# Patient Record
Sex: Female | Born: 1990 | Race: White | Hispanic: No | Marital: Single | Smoking: Former smoker
Health system: Southern US, Community
[De-identification: ages and names within clinical notes are randomized; demographics above are authoritative.]

## PROBLEM LIST (undated history)

## (undated) ENCOUNTER — Ambulatory Visit (HOSPITAL_COMMUNITY): Admission: EM | Payer: BC Managed Care – PPO | Source: Home / Self Care

## (undated) DIAGNOSIS — Z20828 Contact with and (suspected) exposure to other viral communicable diseases: Secondary | ICD-10-CM

## (undated) DIAGNOSIS — F4310 Post-traumatic stress disorder, unspecified: Secondary | ICD-10-CM

## (undated) DIAGNOSIS — G930 Cerebral cysts: Secondary | ICD-10-CM

## (undated) DIAGNOSIS — F419 Anxiety disorder, unspecified: Secondary | ICD-10-CM

## (undated) DIAGNOSIS — H6690 Otitis media, unspecified, unspecified ear: Secondary | ICD-10-CM

## (undated) DIAGNOSIS — F1910 Other psychoactive substance abuse, uncomplicated: Secondary | ICD-10-CM

## (undated) DIAGNOSIS — F209 Schizophrenia, unspecified: Secondary | ICD-10-CM

## (undated) DIAGNOSIS — B083 Erythema infectiosum [fifth disease]: Secondary | ICD-10-CM

## (undated) DIAGNOSIS — B2790 Infectious mononucleosis, unspecified without complication: Secondary | ICD-10-CM

## (undated) DIAGNOSIS — G4700 Insomnia, unspecified: Secondary | ICD-10-CM

## (undated) DIAGNOSIS — F99 Mental disorder, not otherwise specified: Secondary | ICD-10-CM

## (undated) DIAGNOSIS — F319 Bipolar disorder, unspecified: Secondary | ICD-10-CM

## (undated) HISTORY — DX: Contact with and (suspected) exposure to other viral communicable diseases: Z20.828

## (undated) HISTORY — PX: WISDOM TOOTH EXTRACTION: SHX21

---
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
## 2010-06-06 ENCOUNTER — Ambulatory Visit (HOSPITAL_COMMUNITY): Admission: RE | Admit: 2010-06-06 | Discharge: 2010-06-06 | Payer: Self-pay | Admitting: Psychiatry

## 2010-12-12 ENCOUNTER — Emergency Department (HOSPITAL_COMMUNITY)
Admission: EM | Admit: 2010-12-12 | Discharge: 2010-12-12 | Disposition: A | Discharge status: Home / Self Care | Payer: 59 | Source: Home / Self Care | Attending: Emergency Medicine | Admitting: Emergency Medicine

## 2010-12-12 ENCOUNTER — Emergency Department (HOSPITAL_COMMUNITY): Payer: 59

## 2010-12-12 ENCOUNTER — Inpatient Hospital Stay (HOSPITAL_COMMUNITY)
Admission: EM | Admit: 2010-12-12 | Discharge: 2010-12-15 | DRG: 057 | Disposition: A | Discharge status: Home / Self Care | Payer: 59 | Attending: Neurology | Admitting: Neurology

## 2010-12-12 DIAGNOSIS — R51 Headache: Secondary | ICD-10-CM | POA: Insufficient documentation

## 2010-12-12 DIAGNOSIS — L708 Other acne: Secondary | ICD-10-CM | POA: Diagnosis present

## 2010-12-12 DIAGNOSIS — F4310 Post-traumatic stress disorder, unspecified: Secondary | ICD-10-CM | POA: Diagnosis present

## 2010-12-12 DIAGNOSIS — M436 Torticollis: Secondary | ICD-10-CM | POA: Insufficient documentation

## 2010-12-12 DIAGNOSIS — G912 (Idiopathic) normal pressure hydrocephalus: Principal | ICD-10-CM | POA: Diagnosis present

## 2010-12-12 DIAGNOSIS — Z79899 Other long term (current) drug therapy: Secondary | ICD-10-CM

## 2010-12-12 DIAGNOSIS — R112 Nausea with vomiting, unspecified: Secondary | ICD-10-CM | POA: Insufficient documentation

## 2010-12-12 LAB — POCT PREGNANCY, URINE: Preg Test, Ur: NEGATIVE

## 2010-12-12 MED ORDER — GADOBENATE DIMEGLUMINE 529 MG/ML IV SOLN
12.0000 mL | Freq: Once | INTRAVENOUS | Status: AC
  Administered 2010-12-12: 12 mL via INTRAVENOUS

## 2010-12-13 ENCOUNTER — Emergency Department (HOSPITAL_COMMUNITY): Payer: 59

## 2010-12-13 LAB — PROTEIN AND GLUCOSE, CSF
Glucose, CSF: 69 mg/dL (ref 43–76)
Total  Protein, CSF: 22 mg/dL (ref 15–45)

## 2010-12-13 LAB — CSF CELL COUNT WITH DIFFERENTIAL
RBC Count, CSF: 1 /mm3 — ABNORMAL HIGH
Tube #: 1
WBC, CSF: 2 /mm3 (ref 0–5)

## 2010-12-14 LAB — BASIC METABOLIC PANEL
BUN: 7 mg/dL (ref 6–23)
CO2: 24 mEq/L (ref 19–32)
Calcium: 9.5 mg/dL (ref 8.4–10.5)
Creatinine, Ser: 0.77 mg/dL (ref 0.4–1.2)
Glucose, Bld: 99 mg/dL (ref 70–99)

## 2010-12-14 LAB — VDRL, CSF: VDRL Quant, CSF: NONREACTIVE

## 2010-12-14 NOTE — H&P (Signed)
NAME:  Stacy Poole, Stacy Poole             ACCOUNT NO.:  [id number]  MEDICAL RECORD NO.:  [id number]           PATIENT TYPE:  I  LOCATION:  3033                         FACILITY:  [facility]  PHYSICIAN:  Levie Heritage, MD       DATE OF BIRTH:  March 12, 1991  DATE OF ADMISSION:  12/13/2010 DATE OF DISCHARGE:                             HISTORY & PHYSICAL   REASON FOR ADMISSION:  Headaches.  CHIEF COMPLAINT:  Headaches for more than 1 week now.  HISTORY OF PRESENT ILLNESS:  This patient is a 20 year old woman with almost 1-week history of severe headache.  She states that it started on the previous Tuesday, December 07, 2010, with severe throbbing pattern that originally started in the frontal areas of the forehead and then there was associated vomiting and nausea with that.  She has vomited twice at least since then.  In her own words "throbbing frontal and back with pressure on eyes with lot of botheration with light and some, discomfort with noise as well."  The patient also states that it clearly worsens when she has her head position low level and also when she sneezes or coughs or just puts too much pressure on her brain worsens her headache. Additionally, the patient stated that for the last 24 hours she has developed a visual problem where she sees double of everything in a horizontal plane sometime and occasionally it improves as well.  PREVIOUS HEADACHE HISTORY:  The patient is known to have headaches more like migraine without auras for almost 5 years now.  Her mother and sister also has similar patterns of headaches, however, on average, the patient gets one headache at the most per month.  Does not use any prophylaxis medication for that, and it always goes away with the sleep. Clearly, the patient has above headache totally different from her migraine patterns.  She never had any neurological deficits with her typical migraine headaches in the past and has not been using  over-the- counter analgesic medications for long time either.  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  This was September 2011.  Since then, the patient has been on antipsychotic medications. Her psychiatrist is Dr. Geoffery Lyons, and she has been on Zyprexa and Abilify in the past, but currently she is being taking 40 mg daily basis of Latuda.  Other than that, she had a past medical history significant for acne for what she has been taking minocycline.  The patient gets counseling twice a week and sees her psychiatrist on monthly basis.  FAMILY HISTORY:  Mother and sister both have migraine headaches.  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  CURRENT MEDICATIONS:  Minocycline, which she took last dose few days ago for a  month or more and then she is also taking Latuda 40 mg per day.  ALLERGIES:  She denies any allergies to any medications she knows of.  REVIEW OF CLINICAL DATA:  I have reviewed the patient's MRIs as well as CAT scan of the brain that was performed today and yesterday.  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  I have also noted her urine pregnancy test is negative.  CURRENT REVIEW OF SYSTEMS:  The patient is currently complaining of the headache with the double vision in the horizontal plane.  She denies any shortness of breath, denies any chest pain, denies any nausea, vomiting, diarrhea currently.  She denies any recent weight loss.  She  denies any kidney stones.  Denies any burning urination.  Denies any reason for her to be pregnant these days.  Denies any motor or sensory deficits currently.  The rest of 10-organ review of systems has been unremarkable except those mentioned above in the HPI.  PHYSICAL EXAMINATION:  VITAL SIGNS:  Blood pressure 128/68 mmHg, pulse of 73 per minute. GENERAL:  Obvious mild right later old rectus weakness.  As per the mother at the bedside, this is new since yesterday.  Horizontal diplopia due to the above  lateral rectus weakness.  Bilateral pupils reactive to light and accommodation with double vision that improves on bringing the object close to her nose.  Funduscopy finding is suggestive of papilledema with the haziness of the disk margin.  I do not see any obvious pulsation of arteries or any _________ of the veins.  There is no extraocular muscle abnormality except the mildly inverted eye on the right side with the mild weakness on the later rectus on that side. Symmetrical facial sensation and strength midline tongue without atrophy or fasciculation.  Shoulder shrug intact bilaterally.  Motor strength 5/5 all over.  Sensory examination normal to light touch all over.  IMPRESSION:  This patient is a 20 year old woman with at least 1 week history of sudden severe headaches with associated nausea, vomiting, and right lateral rectus palsy with papilledema on the funduscopy.  My impression is that she has pseudotumor cerebri and the lumbar puncture to check the opening pressure should be performed ASAP.  PLAN:  I have discussed my impression in detail length with the patient and her mother at the bedside.  They had list of questions, which I have answered and given them the different options.  The patient is thinking about agreeing to my request of getting lumbar puncture, although she would prefer it under the IR guidance.  Currently, she is on symptomatic treatment of the headache,  and I have advised her to avoid all the stimulants as well as caffeine intake because it can add to her increased intracranial pressure.  She seems to understand that. I am going to start the patient on 125 mg twice a day of Diamox for now that will be increased to 125 mg 3 times a day in another 1-2 days depending on the symptoms.  Depending on her symptoms, the total dose can be adjusted up to 1 g in divided daily doses.  I have advised the side effects of paresthesias altered sensation and taste changes along with the increase renal crystal.  Lasix will be used as an alternative while she is in hospital for now if the symptom worsens.  I would prefer her to be on  prophylactic headache medications in the long run as well, but this will be done after discussing his psych issues with his psychiatrist, Dr. Geoffery Lyons in detail.  My preference would be Topamax or amitriptyline at bedtime in a low dose.  I have discussed my impression and the detail of the case with [facility] physician on-call and have suggested them to draw 20-25 mL of CSF if the opening pressure is obviously high, more than 25.  Drawing fluid at that moment will also be a treatment for her.  The patient has been on minocycline, which I would discontinue right away because tetracyclines have been notorious for increasing intracranial hypertension.  The possibility of further interventions for the possible shunting will depend on her outcome.  I have also discussed the case over the phone in detail with on-call ophthalmologist, and I was told that they will evaluate the patient in the evening today for evaluation of the posterior eye chamber and proceed accordingly.  I have spent at least 1 hour discussing this case with the patient, her mother, and the multiple team members involved as noted above.  She will get symptomatic headache medications for acute worsening of her headache for now.           ______________________________ Levie Heritage, MD  Addendum: Opening pressure was 60 cm of water and 25 cc fluid was drawn to bring closing pressure at 21.  Patient had significant relief of headache and diplopia.  Currently there is no emmergent situation for ophtho eval and can be f/u as outpatient in the morning.  We will manage according to her headache complaints and neuro exam.   Levie Heritage MD     WS/MEDQ  D:  12/13/2010  T:  12/14/2010  Job:  [id number]  Electronically Signed by Levie Heritage MD on 12/14/2010 09:09:48 PM

## 2010-12-17 LAB — CSF CULTURE W GRAM STAIN: Culture: NO GROWTH

## 2010-12-28 NOTE — Discharge Summary (Signed)
NAME:  Stacy Poole, Stacy Poole NO.:  [id number]  MEDICAL RECORD NO.:  [id number]           PATIENT TYPE:  I  LOCATION:  3033                         FACILITY:  [facility]  PHYSICIAN:  Thana Farr, MD    DATE OF BIRTH:  Dec 23, 1990  DATE OF ADMISSION:  12/12/2010 DATE OF DISCHARGE:  12/15/2010                              DISCHARGE SUMMARY   ADMITTING DIAGNOSIS:  Headache.  DISCHARGE DIAGNOSIS:  Benign intracranial hypertension  PROCEDURES: Lumbar puncture.  HISTORY OF PRESENT ILLNESS:  This is 20 year old female who had an approximate 1-week history of severe headache.  Headache was stated to start on December 07, 2010, with severe throbbing pattern and then originally started in the frontal area of the forehead and then was associated with vomiting and nausea.  Due to worsening headache, the patient was evaluated in the emergency department at [facility] on December 12, 2010, at that time the patient refused CT scan and was discharged home with followup with [facility]. Due to headache worsening on December 13, 2010, the patient returned to [facility] ED for further evaluation.  At that time, Neurology was called at approximately 8:00 p.m. and emergency department was advised to obtain MRI.  On December 14, 2010, the patient's headache continued and the patient showed a sixth nerve palsy on the left and thus Neurology was asked again to see the patient.  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  On December 14, 2010, the patient underwent a fluoro-guided lumbar puncture.  Results from lumbar puncture showed clear CSF with an opening pressure of 60 cm of water. At that time, 24 mL of CSF was obtained and a closing pressure was recorded as 21 cm of water.  Post lumbar puncture, the patient had significant relief of headache and sixth nerve palsy.  The  patient was at that time started on Diamox 250 mg b.i.d.  On the evening of December 13, 2010, at approximately 4:00 p.m., an ophthalmological consult was called in to Dr. Mckinley Jewel by Dr. Hoy Morn.  In discussion with Dr. Dagoberto Ligas, Dr. Hoy Morn was explained that she had already talked to Dr. Sandria Manly of [facility] and Dr. Dagoberto Ligas believed that this was not an emergent consultation, and she would see the patient as an outpatient.  At the present time, the patient had significant relief of her headache with p.o. medications, such as Percocet q.6 h. and she is currently on Diamox 500 mg p.o. b.i.d.  PHYSICAL EXAMINATION:  GENERAL:  The patient is alert and oriented. HEENT:  Pupils are equal, round, reactive to light and accommodating. Face is symmetrical.  Tongue is midline. NEUROLOGIC:  Vision is grossly intact.  The patient is moving all extremities 5/5.  Sensation is grossly intact.  Deep tendon reflexes are 2+.  She had downgoing toes bilaterally.  Finger-to-nose and heel-to- shin are smooth.  DIAGNOSTIC TESTS: 1. MRI of brain obtained on December 13, 2010, showed no acute     intracranial abnormalities.  It did show a mass which did not  enhance and was compatible with an arachnoid cyst measuring     approximately 9 x 12 mm. 2. Fluoro-guided lumbar puncture as stated above.  LABORATORY DATA:  Sodium 134, potassium 4.1, chloride 103, CO2 is 24, glucose 99, BUN 7, creatinine 0.77. CSF findings on lumbar puncture showed tube 1 to be colorless clear, white blood cell count of 2, red blood cell count of 1, few lymphocyte. VDRL is negative.  CSF culture showed no growth.  Protein was 22. Glucose of 69.  Urine pregnancy was negative.  DISCHARGE MEDICATIONS: 1. Diamox 500 mg p.o. t.i.d. 2. Oxycodone 5/325 mg one to two tablets p.o. q.6 h. as needed. 3. Phenergan 25 mg q.8 h. as needed by mouth. 4. Fish oil over the counter one tablet by mouth daily. 5.  Latuda 40 mg one tablet by mouth daily at bedtime. 6. Melatonin 6 mg one tablet by mouth daily. 7. Vitamin E 400 international units one tablet by mouth daily.  PRESCRIPTIONS GIVEN:  The patient was given a prescription for oxycodone 5/325 mg tablet by mouth q.6 h., quantity of 60, no refill; a prescription for acetazolamide 500 mg caps 500 mg by mouth twice daily, quantity 60, no refills; and promethazine 25 mg tabs by mouth q.8 h. as needed, quantity 100, no refills.  DISPOSITION:  The patient will be discharged home on these medications.  FOLLOWUP:  Followup appointments have been made for the patient: 1. On Wednesday, December 21, 2010, at 10:00 a.m. with Dr. Antony Contras     for Ophthalmology. 2. With Dr. Anne Hahn at 3:30 p.m. on December 19, 2010, of [facility]     Neurology for neurological followup.  IMPRESSION:  This is a pleasant 20 year old female with elevated cerebrospinal fluid pressure found diagnostically by lumbar puncture confirming diagnosis of normal-pressure hydrocephalus.  The patient has been placed on Diamox 500 mg b.i.d. to continue until followup with Dr. Anne Hahn on December 19, 2010, at 3:30 p.m.  She is to take oxycodone 5/325 mg q.6 h. on an as-needed basis only.  The patient has been instructed that this medication is on as needed basis only and in between, she may take Tylenol or ibuprofen.  I have discussed these findings with Dr. Thad Ranger.  She is in agreement, and I have discussed this discharge with the patient and the patient's mother.     Felicie Morn, PA-C   ______________________________ Thana Farr, MD    DS/MEDQ  D:  12/15/2010  T:  12/16/2010  Job:  [id number]  cc:   Marlan Palau, M.D. Antony Contras, M.D. Dr. Thad Ranger of [facility]  Electronically Signed by Felicie Morn PA-C on 12/26/2010 12:42:42 PM Electronically Signed by Thana Farr MD on 12/28/2010 06:07:29 PM

## 2011-09-25 ENCOUNTER — Emergency Department (HOSPITAL_COMMUNITY)
Admission: EM | Admit: 2011-09-25 | Discharge: 2011-09-25 | Disposition: A | Discharge status: Other Acute Inpatient Hospital | Payer: 59 | Attending: Emergency Medicine | Admitting: Emergency Medicine

## 2011-09-25 ENCOUNTER — Encounter (HOSPITAL_COMMUNITY): Payer: Self-pay

## 2011-09-25 ENCOUNTER — Inpatient Hospital Stay (HOSPITAL_COMMUNITY): Admission: AD | Admit: 2011-09-25 | Payer: 59 | Source: Ambulatory Visit | Admitting: Psychiatry

## 2011-09-25 DIAGNOSIS — F319 Bipolar disorder, unspecified: Secondary | ICD-10-CM | POA: Insufficient documentation

## 2011-09-25 DIAGNOSIS — Z79899 Other long term (current) drug therapy: Secondary | ICD-10-CM | POA: Insufficient documentation

## 2011-09-25 DIAGNOSIS — F17200 Nicotine dependence, unspecified, uncomplicated: Secondary | ICD-10-CM | POA: Insufficient documentation

## 2011-09-25 DIAGNOSIS — F411 Generalized anxiety disorder: Secondary | ICD-10-CM | POA: Insufficient documentation

## 2011-09-25 DIAGNOSIS — F419 Anxiety disorder, unspecified: Secondary | ICD-10-CM

## 2011-09-25 DIAGNOSIS — F1010 Alcohol abuse, uncomplicated: Secondary | ICD-10-CM

## 2011-09-25 HISTORY — DX: Other psychoactive substance abuse, uncomplicated: F19.10

## 2011-09-25 HISTORY — DX: Infectious mononucleosis, unspecified without complication: B27.90

## 2011-09-25 HISTORY — DX: Anxiety disorder, unspecified: F41.9

## 2011-09-25 HISTORY — DX: Otitis media, unspecified, unspecified ear: H66.90

## 2011-09-25 HISTORY — DX: Erythema infectiosum (fifth disease): B08.3

## 2011-09-25 LAB — CBC
Hemoglobin: 11.8 g/dL — ABNORMAL LOW (ref 12.0–15.0)
MCH: 29.2 pg (ref 26.0–34.0)
MCHC: 33.4 g/dL (ref 30.0–36.0)
MCV: 87 fL (ref 78.0–100.0)
Platelets: 415 10*3/uL — ABNORMAL HIGH (ref 150–400)
Platelets: 276 10*3/uL (ref 150–400)
RBC: 4.62 MIL/uL (ref 3.87–5.11)
RDW: 13.7 % (ref 11.5–15.5)
WBC: 8 10*3/uL (ref 4.0–10.5)

## 2011-09-25 LAB — COMPREHENSIVE METABOLIC PANEL
ALT: 13 U/L (ref 0–35)
AST: 17 U/L (ref 0–37)
CO2: 23 mEq/L (ref 19–32)
Chloride: 102 mEq/L (ref 96–112)
Creatinine, Ser: 0.88 mg/dL (ref 0.50–1.10)
GFR calc Af Amer: >90 mL/min (ref >90)
Sodium: 137 mEq/L (ref 135–145)

## 2011-09-25 LAB — RAPID URINE DRUG SCREEN, HOSP PERFORMED
Opiates: POSITIVE — AB
Tetrahydrocannabinol: NOT DETECTED

## 2011-09-25 LAB — ETHANOL: Alcohol, Ethyl (B): <11 mg/dL (ref 0–11)

## 2011-09-25 LAB — ACETAMINOPHEN LEVEL: Acetaminophen (Tylenol), Serum: <15 ug/mL (ref 10–30)

## 2011-09-25 MED ORDER — VITAMIN B-1 100 MG PO TABS
100.0000 mg | ORAL_TABLET | Freq: Every day | ORAL | Status: DC
  Administered 2011-09-25: 100 mg via ORAL
  Filled 2011-09-25: qty 1

## 2011-09-25 MED ORDER — LORAZEPAM 2 MG/ML IJ SOLN: 1.0000 mg | Freq: Four times a day (QID) | INTRAMUSCULAR | Status: DC | PRN

## 2011-09-25 MED ORDER — ZIPRASIDONE MESYLATE 20 MG IM SOLR: 10.0000 mg | Freq: Two times a day (BID) | INTRAMUSCULAR | Status: DC | PRN

## 2011-09-25 MED ORDER — IBUPROFEN 600 MG PO TABS: 600.0000 mg | ORAL_TABLET | Freq: Three times a day (TID) | ORAL | Status: DC | PRN

## 2011-09-25 MED ORDER — ZIPRASIDONE MESYLATE 20 MG IM SOLR
20.0000 mg | Freq: Two times a day (BID) | INTRAMUSCULAR | Status: DC | PRN
  Filled 2011-09-25: qty 20

## 2011-09-25 MED ORDER — ZIPRASIDONE MESYLATE 20 MG IM SOLR: 10.0000 mg | Freq: Once | INTRAMUSCULAR | Status: DC

## 2011-09-25 MED ORDER — ALUM & MAG HYDROXIDE-SIMETH 200-200-20 MG/5ML PO SUSP: 30.0000 mL | ORAL | Status: DC | PRN

## 2011-09-25 MED ORDER — THIAMINE HCL 100 MG/ML IJ SOLN: 100.0000 mg | Freq: Every day | INTRAMUSCULAR | Status: DC

## 2011-09-25 MED ORDER — NICOTINE 21 MG/24HR TD PT24
21.0000 mg | MEDICATED_PATCH | Freq: Every day | TRANSDERMAL | Status: DC | PRN
  Administered 2011-09-25: 21 mg via TRANSDERMAL
  Filled 2011-09-25: qty 1

## 2011-09-25 MED ORDER — ADULT MULTIVITAMIN W/MINERALS CH
1.0000 | ORAL_TABLET | Freq: Every day | ORAL | Status: DC
  Administered 2011-09-25: 1 via ORAL
  Filled 2011-09-25: qty 1

## 2011-09-25 MED ORDER — FOLIC ACID 1 MG PO TABS
1.0000 mg | ORAL_TABLET | Freq: Every day | ORAL | Status: DC
  Administered 2011-09-25: 1 mg via ORAL
  Filled 2011-09-25: qty 1

## 2011-09-25 MED ORDER — ONDANSETRON HCL 4 MG PO TABS: 4.0000 mg | ORAL_TABLET | Freq: Three times a day (TID) | ORAL | Status: DC | PRN

## 2011-09-25 MED ORDER — ACETAMINOPHEN 325 MG PO TABS: 650.0000 mg | ORAL_TABLET | ORAL | Status: DC | PRN

## 2011-09-25 MED ORDER — LORAZEPAM 1 MG PO TABS
1.0000 mg | ORAL_TABLET | Freq: Four times a day (QID) | ORAL | Status: DC | PRN
  Filled 2011-09-25: qty 1

## 2011-09-25 MED ORDER — ZOLPIDEM TARTRATE 5 MG PO TABS: 5.0000 mg | ORAL_TABLET | Freq: Every evening | ORAL | Status: DC | PRN

## 2011-09-25 MED ORDER — LORAZEPAM 1 MG PO TABS
1.0000 mg | ORAL_TABLET | Freq: Three times a day (TID) | ORAL | Status: DC | PRN
  Administered 2011-09-25: 1 mg via ORAL
  Filled 2011-09-25: qty 1

## 2011-09-25 MED ORDER — ZIPRASIDONE MESYLATE 20 MG IM SOLR: 20.0000 mg | Freq: Once | INTRAMUSCULAR | Status: DC
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
## 2011-09-25 NOTE — ED Provider Notes (Signed)
Pt is anxious appearing. VS stable. ACT to assess and likely telepsych consult.   Loren Racer, MD 09/25/11 [phone]

## 2011-09-25 NOTE — ED Notes (Signed)
Mother picked up pt from [place]- roommates concern with etoh abuse and pain killers, pt seen at urgent care for ear infection, anxiety

## 2011-09-25 NOTE — ED Notes (Signed)
[place] Sheriff's Depart. Picked pt. Up to transport to [facility].  Sheriff's transported 1 bag of pt. Belongings.

## 2011-09-25 NOTE — ED Notes (Signed)
Report called to [place], to [place].  [place] Sheriff's will provide transportion.

## 2011-09-25 NOTE — ED Notes (Signed)
Pt.'s behavior is calmer and redirectable.

## 2011-09-25 NOTE — ED Notes (Signed)
Pt. Asleep, breathing normal. 

## 2011-09-25 NOTE — ED Notes (Signed)
2 necklaces, 2 rings, 2 earrings, 1 bracelet. All jewelry in specimen container within personal belongings bag. 1 robe, 1 tshirt, 1 pair of flip flops, 1 pair of loose pants, 1 pair of underwear. 1 bag of belongings located behind nurses station.

## 2011-09-25 NOTE — ED Notes (Signed)
Offered pt. A shower, pt. Declined.

## 2011-09-25 NOTE — ED Notes (Signed)
Requested EDP to change Geodon IM to PRN.

## 2011-09-25 NOTE — ED Notes (Signed)
Pt. Speaking with ACT team member in room.

## 2011-09-25 NOTE — ED Notes (Signed)
Pt asleep in bed, breathing normal.

## 2011-09-25 NOTE — ED Notes (Signed)
Pt. Speaking with pharm tech r/t home medications.

## 2011-09-25 NOTE — ED Notes (Signed)
Pt. Having [facility] consult done.

## 2011-09-25 NOTE — ED Notes (Signed)
Pt has been accepted to [facility] by Dr. Robet Leu. EDP has been notified and is in agreement with disposition. RN made aware to call report to [phone]. IVC papers faxed to [facility] for review. Pt is to be transported by sheriff to the [place] building at [facility]. No further CSW needs identified at this time.

## 2011-09-25 NOTE — ED Notes (Signed)
Pt. Ambulated to RN window, asking multipule questions without stopping.  Pt. States that she is here to get away from her mother, that she is worried about her 16 yr. Old sister, knows she needs help, hasn't slept in a long time.  Pt. Given snack/drink, medication, assured that we would try to help her, pt. Calmed down and apologized for all her bad language.

## 2011-09-25 NOTE — ED Notes (Signed)
Pt. Continues to have racing thoughts, pt. Redirectable.  Encouraged to sleep, pt. States unable unless she takes xanax, informed pt. She did have ativan, states that doesn't help.

## 2011-09-25 NOTE — ED Notes (Signed)
Pt. In blue scrubs and red socks. Pt. And belongings both scanned by security.
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
## 2011-09-25 NOTE — ED Notes (Signed)
Left message for transportation with [place] Sheriff Depart.  Will continue to monitor.

## 2011-10-05 NOTE — ED Notes (Signed)
Attempted to call [phone]. No answer.

## 2011-10-13 ENCOUNTER — Encounter (HOSPITAL_COMMUNITY): Payer: Self-pay | Admitting: *Deleted

## 2011-10-13 ENCOUNTER — Ambulatory Visit (HOSPITAL_COMMUNITY)
Admission: RE | Admit: 2011-10-13 | Discharge: 2011-10-13 | Disposition: A | Discharge status: Home / Self Care | Payer: 59 | Source: Ambulatory Visit | Attending: Psychiatry | Admitting: Psychiatry

## 2011-10-13 DIAGNOSIS — F1020 Alcohol dependence, uncomplicated: Secondary | ICD-10-CM | POA: Insufficient documentation

## 2011-10-13 DIAGNOSIS — F29 Unspecified psychosis not due to a substance or known physiological condition: Secondary | ICD-10-CM | POA: Insufficient documentation

## 2011-10-13 HISTORY — DX: Mental disorder, not otherwise specified: F99
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
## 2011-10-18 ENCOUNTER — Ambulatory Visit (HOSPITAL_COMMUNITY): Payer: Self-pay | Admitting: *Deleted

## 2011-10-18 NOTE — Progress Notes (Signed)
Patient ID: Stacy Poole, female   DOB: 1990-10-04, 21 y.o.   MRN: [id number] Pt arrived for scheduled admission/orientation appt to CDIOP group at 10am. She was uneasy, could not sit still, went outside and returned inside a couple of times, very agitated. When this writer went to see how pt was progressing on the intake paperwork the pt said she could not complete the paperwork because she did not want treatment, did not know or trust me and could not see well enough due to a headache. After some extended discussion regarding what pt was wanting for psych help, this writer asked the pt if she would like to take the paperwork home to complete and think about if she wanted to attend a group therapy treatment program. PT agreed that if she cannot trust this writer she is not going to be open in group at this point. Pt repeated numerous times that she is only here because her mom is making her and that she doesn't want to be. Pt made limited eye contact and struggled keeping on subject. Possibly actively psychotic but denied SI/ HI/ wanting to harm anyone. Checked with pt to be sure she is still seeing Dr. Dub Mikes and [facility]. Pt confirmed that and said she would call them and that she "wished she'd never left [facility]." Pt confirmed she will call this Clinical research associate or Charmian Muff, LCAS (was given both phone numbers again on the paperwork) if she wants to return to try group.

## 2011-10-23 ENCOUNTER — Other Ambulatory Visit (HOSPITAL_COMMUNITY): Payer: 59 | Attending: Physician Assistant | Admitting: Psychology

## 2011-10-23 DIAGNOSIS — Z888 Allergy status to other drugs, medicaments and biological substances status: Secondary | ICD-10-CM | POA: Insufficient documentation

## 2011-10-23 DIAGNOSIS — F1020 Alcohol dependence, uncomplicated: Secondary | ICD-10-CM | POA: Insufficient documentation

## 2011-10-23 DIAGNOSIS — F4310 Post-traumatic stress disorder, unspecified: Secondary | ICD-10-CM | POA: Insufficient documentation

## 2011-10-23 DIAGNOSIS — F29 Unspecified psychosis not due to a substance or known physiological condition: Secondary | ICD-10-CM | POA: Insufficient documentation

## 2011-10-23 DIAGNOSIS — F449 Dissociative and conversion disorder, unspecified: Secondary | ICD-10-CM | POA: Insufficient documentation

## 2011-10-23 DIAGNOSIS — Z79899 Other long term (current) drug therapy: Secondary | ICD-10-CM | POA: Insufficient documentation

## 2011-10-23 DIAGNOSIS — F411 Generalized anxiety disorder: Secondary | ICD-10-CM | POA: Insufficient documentation

## 2011-10-23 NOTE — Progress Notes (Signed)
Patient ID: Stacy Poole, female   DOB: 22-Jul-1991, 22 y.o.   MRN: [id number] Met with pt and her mother to complete orientation to CDIOP. Pt has significant anxiety and anger. She appears committed and motivated for treatment but very nervous about the group process and about 12-step meetings outside of group. Encouraged pt to be as honest as she is able and informed her we will schedule an individual session asap.

## 2011-10-24 NOTE — Progress Notes (Signed)
    Daily Group Progress Note  Program: CD-IOP   Group Time: 1-2:30 pm  Participation Level: Active  Behavioral Response: Appropriate and Sharing  Type of Therapy: Psycho-education Group  Topic: Recovery 101: first part of group included a review of basic recovery concepts. The group had 3 new group members today and the session was, in part, a primer for recovery and also a review for current group members. There was good discussion among the members who recounted specific events or incidents where they struggled, were successful in fighting off cravings or, in some instances, succumbed to their addiction. The new group members engaged in the discussion and each one shared significantly about their own struggles.     Group Time: 2:45-4 pm  Participation Level: Active  Behavioral Response: Sharing  Type of Therapy: Process Group  Topic: Group process; second half of group was spent in process. Members discussed current issues and concerns in early recovery. One member noted she had picked up a 30 day chip yesterday and the group applauded this news. There was good disclosure and feedback among the group.    Summary: The patient was new to the group and introduced herself during the check-in. She described herself as an alcoholic. In the first half of group, the patient was attentive, but made little eye contact. She admitted she had to complete this program before she is able to return to school at [employer] in [place]. When talking about triggers, she noted that her roommate is an alcoholic and everybody she knows gets high. She admitted it would be really hard to stay sober at school. She received good feedback and it appeared from my perspective, that the first session was comfortable and non-threatening for this new patient who struggles with anxiety among other issues. She responded well to this first session.    Family Program: Family present? No   Name of family member(s):   UDS  collected: No Results:   AA/NA attended?: No, new to the program and recovery  Sponsor?: No   Mendell Bontempo, LCAS

## 2011-10-25 ENCOUNTER — Other Ambulatory Visit (HOSPITAL_COMMUNITY): Payer: 59 | Admitting: Psychology

## 2011-10-25 DIAGNOSIS — F1920 Other psychoactive substance dependence, uncomplicated: Secondary | ICD-10-CM
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
## 2011-10-27 ENCOUNTER — Other Ambulatory Visit (HOSPITAL_COMMUNITY): Payer: 59 | Admitting: Psychology

## 2011-10-30 ENCOUNTER — Encounter (HOSPITAL_COMMUNITY): Payer: Self-pay

## 2011-10-30 ENCOUNTER — Other Ambulatory Visit (HOSPITAL_COMMUNITY): Payer: 59 | Admitting: Psychology

## 2011-10-30 DIAGNOSIS — F1020 Alcohol dependence, uncomplicated: Secondary | ICD-10-CM
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
## 2011-10-31 NOTE — Progress Notes (Signed)
    Daily Group Progress Note  Program: CD-IOP   Group Time: 1-2:30 pm  Participation Level: Minimal  Behavioral Response: little disclosure  Type of Therapy: Psycho-education Group  Topic: Controlling: letting go of control in early recovery. First half of group included a presentation on 'control' and the need to let go of efforts to control others. A handout was provided and the group read some of the examples together. A discussion followed about letting go and learning to trust one's own self.       Group Time: 2:45-4 pm  Participation Level: Minimal  Behavioral Response: Sharing  Type of Therapy: Process Group  Topic: Group Process/Graduation: second half of group was spent in process. A new member was in group today and he shared about his life and struggle with addiction. The group assisted him in processing his plans going forward and, at times, challenged him to reconsider his ideas. At the conclusion of the session, a graduation ceremony was held to honor a member successfully completing this program. There were kind words and wishes and hearty thanks from the graduating member.   Summary: Patient arrived late for group. She was attentive during the discussion on control, but shared little. She reported she had remained sober over the weekend. During the graduation ceremony, the patient spoke kindly to the graduating member and wished her well.    Family Program: Family present? No   Name of family member(s):   UDS collected: No Results:   AA/NA attended?: No  Sponsor?: No   Dorothy Landgrebe, LCAS

## 2011-10-31 NOTE — Progress Notes (Signed)
    Daily Group Progress Note  Program: CD-IOP   Group Time: 1-2:30 pm  Participation Level: Minimal  Behavioral Response: quiet and reserved  Type of Therapy: Psycho-education Group  Topic: Communication: presentation provided on communication and the importance of communicating clearly to others. A handout was provided identifying different kinds of communication, including aggressive, passive, and assertive communication. Group members shared their typical styles and provided examples where they had mis-communicated with unfortunate and damaging results. Communicating effectively in relationships was also discussed. There was good engagement among the group members.     Group Time: 2:45-4 pm  Participation Level: Active  Behavioral Response: Sharing  Type of Therapy: Process Group  Topic:Process: second half of group spent in process. Members shared some of their current concerns and issues. Members provided good feedback to each other and there was good disclosure.     Summary: the patient was attentive and engaged in the discussion. She brought up issues that did not necessarily relate to the topic. She expressed frustration and hopelessness about her mother's constant efforts to control her despite the group pointing out her mother couldn't change the way she thought. In the second half of group, the patient met with the Medical Director. When she returned, she talked about how he had made her feel 'crazy' and she rambled on about becoming an 'operative' and going undercover. The group tried hard to support and encourage this member, but they were clearly confused about the ideas and beliefs she shared about her life and her future.   Family Program: Family present? No   Name of family member(s):   UDS collected: No Results:  AA/NA attended?: No  Sponsor?: No   Tiamarie Furnari, LCAS

## 2011-11-01 ENCOUNTER — Other Ambulatory Visit (HOSPITAL_COMMUNITY): Payer: 59 | Admitting: Psychology

## 2011-11-01 DIAGNOSIS — F1920 Other psychoactive substance dependence, uncomplicated: Secondary | ICD-10-CM

## 2011-11-02 LAB — PRESCRIPTION ABUSE MONITORING 17P, URINE
Amphetamine/Meth: NEGATIVE ng/mL
Barbiturate Screen, Urine: NEGATIVE ng/mL
Carisoprodol, Urine: NEGATIVE ng/mL
Cocaine Metabolites: NEGATIVE ng/mL
Fentanyl, Ur: NEGATIVE ng/mL
Oxycodone Screen, Ur: NEGATIVE ng/mL
Propoxyphene: NEGATIVE ng/mL
Tramadol Scrn, Ur: NEGATIVE ng/mL

## 2011-11-02 NOTE — Progress Notes (Signed)
    Daily Group Progress Note  Program: CD-IOP   Group Time: 1-2:30 pm  Participation Level: Minimal  Behavioral Response: Appropriate  Type of Therapy: Psycho-education Group  Topic: Resentments: addressing your resentments in early recovery. A presentation was provided on resentments. Handouts were included and the group engaged in a discussion about how and why resentments develop. One member noted she had come to recognize that by keeping her 'resentment' it perpetuated her 'closeness' to the person or issue. This isnight proved monumental to her fellow members. There were disclosures of great depth and personal importance and each member shared in the session.   Group Time: 2:45-4 pm  Participation Level: Minimal  Behavioral Response: Sharing  Type of Therapy: Process Group  Topic: Group Process; second half of group was spent in process. Members shared about their issues and current concerns. Frustrations were vented and good feedback provided to group members.   Summary: Patient arrived late for group and reported she had just gotten up. She was attentive in session and noted there is somebody in [place] that is just like her. She explained that this person is the reason she had gotten into treatment. She has a resentment towards him, but would not elaborate. She was appropriate   Family Program: Family present? No   Name of family member(s):   UDS collected: Yes Results:   AA/NA attended?: No  Sponsor?: No   Orey Moure, LCAS

## 2011-11-03 ENCOUNTER — Other Ambulatory Visit (HOSPITAL_COMMUNITY): Payer: 59 | Attending: Physician Assistant | Admitting: Psychology

## 2011-11-03 DIAGNOSIS — Z79899 Other long term (current) drug therapy: Secondary | ICD-10-CM | POA: Insufficient documentation

## 2011-11-03 DIAGNOSIS — F29 Unspecified psychosis not due to a substance or known physiological condition: Secondary | ICD-10-CM | POA: Insufficient documentation

## 2011-11-03 DIAGNOSIS — Z888 Allergy status to other drugs, medicaments and biological substances status: Secondary | ICD-10-CM | POA: Insufficient documentation

## 2011-11-03 DIAGNOSIS — F449 Dissociative and conversion disorder, unspecified: Secondary | ICD-10-CM | POA: Insufficient documentation

## 2011-11-03 DIAGNOSIS — F1020 Alcohol dependence, uncomplicated: Secondary | ICD-10-CM | POA: Insufficient documentation

## 2011-11-03 DIAGNOSIS — F4310 Post-traumatic stress disorder, unspecified: Secondary | ICD-10-CM | POA: Insufficient documentation

## 2011-11-03 DIAGNOSIS — F411 Generalized anxiety disorder: Secondary | ICD-10-CM | POA: Insufficient documentation

## 2011-11-06 ENCOUNTER — Other Ambulatory Visit (HOSPITAL_COMMUNITY): Payer: 59 | Admitting: Psychology

## 2011-11-06 DIAGNOSIS — F1920 Other psychoactive substance dependence, uncomplicated: Secondary | ICD-10-CM
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
## 2011-11-07 LAB — PRESCRIPTION ABUSE MONITORING 17P, URINE
Amphetamine/Meth: NEGATIVE ng/mL
Barbiturate Screen, Urine: NEGATIVE ng/mL
Cannabinoid Scrn, Ur: NEGATIVE ng/mL
Carisoprodol, Urine: NEGATIVE ng/mL
Cocaine Metabolites: NEGATIVE ng/mL
Fentanyl, Ur: NEGATIVE ng/mL
Meperidine, Ur: NEGATIVE ng/mL
Tapentadol, urine: NEGATIVE ng/mL
Tramadol Scrn, Ur: NEGATIVE ng/mL

## 2011-11-07 NOTE — Progress Notes (Signed)
    Daily Group Progress Note  Program: CD-IOP   Group Time: 1-2:30 pm  Participation Level: Minimal  Behavioral Response: quiet  Type of Therapy: Psycho-education Group  Topic: Treatment for a BUD: first half of group spent in psycho-education. A presentation was provided on identifying when one is in a "BUD", or building up to drinking/drugging. There are identifiable changes that many go through prior to relapse and these changes in thinking, attitudes, and behaviors were identified and discussed. Members shared what they have done in the past to avoid relapse and the importance of 'talking to someone' was reiterated. The message from the session was that actually drinking/drugging is the last thing that happens in this process and there is time to disrupt the use.  Group Time: 2:45-4 pm  Participation Level: Active  Behavioral Response: Sharing  Type of Therapy: Process Group  Topic: Process: second half of group spent in process. Members shared their frustrations and issues in early recovery. A new member was present and he was invited by the group to share a little of what brings him to this program. He was very open about his struggles with Klonipin and the terrible withdrawal he had gone through. There was good disclosure among group members and excellent feedback and support to the newer group members.    Summary: the patient arrived late to group today. In process, she reported she had contacted an old friend and learned that she was also in recovery and chairing an AA group. The patient got a ride with her and attended her first 12-step meeting since joining the program. The patient reported she is feeling better about herself and has been playing soccer and being much more active. She was more engaged and present than in past sessions and she was applauded by her fellow group members.    Family Program: Family present? No   Name of family member(s):   UDS collected: Yes  Results:Not available  AA/NA attended?: YesSaturday  Sponsor?: No   Asuncion Shibata, LCAS

## 2011-11-08 ENCOUNTER — Other Ambulatory Visit (HOSPITAL_COMMUNITY): Payer: 59 | Admitting: Psychology

## 2011-11-09 NOTE — Progress Notes (Incomplete)
    Daily Group Progress Note  Program: CD-IOP   Group Time: 1-2:30 pm  Participation Level: Minimal  Behavioral Response: Appropriate  Type of Therapy: Psycho-education Group  Topic: Criteria for Substance Dependency Diagnosis: First half of group spent discussing the handout identifying the 7 criteria for Substance Dependence as found in the DSM-IV. Each of the criteria was discussed at length and a number of the group members admitted they met all 7 criteria. The newest group member was asked whether he met at least of the 3 criteria and he confessed that he met more than 3. He had previously questioned whether he was addicted or just abusing the Klonipin. There was good disclosure among the group members.  Group Time: ***  Participation Level: {CHL AMB BH Group Participation:21022742}  Behavioral Response: {CHL AMB BH Group Behavior:21022743}  Type of Therapy: {CHL AMB BH Type of Therapy:21022741}  Topic: ***   Summary: ***   Family Program: Family present? {BHH YES OR NO:22294}   Name of family member(s): ***  UDS collected: {BHH YES OR NO:22294} Results: {Findings; urine drug screen:60936}  AA/NA attended?: {BHH YES OR NO:22294}{DAYS OF WUJW:11914}  Sponsor?: {BHH YES OR NO:22294}   Crystalina Stodghill, LCAS

## 2011-11-10 ENCOUNTER — Other Ambulatory Visit (HOSPITAL_COMMUNITY): Payer: 59 | Admitting: *Deleted

## 2011-11-13 ENCOUNTER — Other Ambulatory Visit (HOSPITAL_COMMUNITY): Payer: 59 | Admitting: Psychology

## 2011-11-14 NOTE — Progress Notes (Signed)
    Daily Group Progress Note  Program: CD-IOP   Group Time: 1-2:30 pm  Participation Level: Minimal  Behavioral Response: very quiet  Type of Therapy: Psycho-education Group  Topic:  Chaplain and Spirituality: First half of group included the monthly session with a Chaplain in the [employer]. Theda Belfast, the director of [employer], appeared today. He discussed the meaning of spirituality versus religion and invited group members to provide their definition of spirituality. The conversation turned to grief and loss and how we find meaning through grief and learn to mourn our losses fully. The chaplain encouraged members to be aware of where and when they felt most grounded or centered and to seek out and explore those areas more. There was good feedback and discussion among the group members.    Group Time: 2:45-4 pm  Participation Level: Active  Behavioral Response: Sharing  Type of Therapy: Process Group  Topic: Group Process; second half of group spent in process. Members shared some of their current concerns. A new member was present today and he was asked to share the circumstances of his presence here in the CD-IOP. The new member proceeded to provide a classic case of denial, minimization and deflection. The group was firm about this and pointed out the discrepancies in his story. The patient was able to share openly about his narcotic pain addiction and admitted he belonged in the group.   Summary: The patient shared very little about her faith or beliefs regarding spirituality during the presentation by the chaplain. In process, the patient reported she had a good friend visit this weekend. When asked about attending AA meetings, the patient indicated she did not go to any because her friend was visiting. The group reminded the patient of the importance of recovery and how it comes first. When asked about her new sponsor, the patient admitted she had decided not  to work with the woman. One member reminded her that recovery requires effort. When the new group member was sharing, this patient responded to his disclosure about going to [facility], a treatment center in [place]. She recounted the events that had occurred and how she spent 2 weeks in there and how terrible it was. The patient was very animated when sharing about her own experiences in detox.    Family Program: Family present? No   Name of family member(s):   UDS collected: No Results  AA/NA attended?: No  Sponsor?: No   Louis Gaw, LCAS

## 2011-11-15 ENCOUNTER — Other Ambulatory Visit (HOSPITAL_COMMUNITY): Payer: 59 | Admitting: Psychology

## 2011-11-15 DIAGNOSIS — F1920 Other psychoactive substance dependence, uncomplicated: Secondary | ICD-10-CM

## 2011-11-16 LAB — PRESCRIPTION ABUSE MONITORING 17P, URINE
Cannabinoid Scrn, Ur: NEGATIVE ng/mL
Carisoprodol, Urine: NEGATIVE ng/mL
Cocaine Metabolites: NEGATIVE ng/mL
Creatinine, Urine: 58.36 mg/dL
Methadone Screen, Urine: NEGATIVE ng/mL
Opiate Screen, Urine: NEGATIVE ng/mL
Tapentadol, urine: NEGATIVE ng/mL
Tramadol Scrn, Ur: NEGATIVE ng/mL

## 2011-11-16 NOTE — Progress Notes (Signed)
    Daily Group Progress Note  Program: CD-IOP   Group Time: 1-2:30 pm  Participation Level: Active  Behavioral Response: Appropriate  Type of Therapy: Psycho-education Group  Topic: Regaining Trust: First part of group spent in psycho- education concerning the issue of trust. A handout was provided that included identifying ways that the recovering person can regain trust as well as things that the family can do to rebuild trust. The handout generated a lively discussion on the topic of trust and the struggles that group members have experienced both in their addiction and now in recovery.   Group Time: 2:45- 4pm  Participation Level: Active  Behavioral Response: Sharing  Type of Therapy: Process Group  Topic: Group Process; second half of group spent in process. Members discussed current issues they are dealing with. There were 2 new group members present and they introduced themselves. One of the new members seemed to minimize his alcohol use and the group had numerous questions for him. There was good disclosure and feedback among the group.   Summary: the patient complained about her mother and her lack of trust towards her daughter. She admitted there had been reasons to be upset, but she feels as if she nags her excessively. The patient reported her mother had asked her if she would stay sober when she returned to [place] for school? The patient admitted she didn't know what to say. The group provided feedback to this patient and emphasized the importance of focusing on "today" and not something months ahead. This patient was reminded that she can't guarantee her mother any sort of thing and can really only say she is sober today. There was good feedback provided to this group member and she responded well to this intervention.    Family Program: Family present? No   Name of family member(s):   UDS collected: Yes Results: negative  AA/NA attended?: YesThursday and Saturday  Sponsor?: No   Lilliam Chamblee, LCAS

## 2011-11-16 NOTE — Progress Notes (Signed)
    Daily Group Progress Note  Program: CD-IOP   Group Time: 1:00-2:15  Participation Level: Minimal  Behavioral Response: Rationalizing, Resistant, Blaming, Evasive and Minimizing  Type of Therapy: Group Therapy  Topic: Group therapy time was spent in process and discussion. Pts shared their experiences in 12-Step meetings and encouraged each other in the recovery process. Stacy Poole was clear with the group that she has not liked the AA meetings she has gone to and may not return. Group members encouraged and challenged her to try other meetings and gave suggestions of which meetings she may like. Pt was moderately receptive to this feedback from the group members but maintains very mixed motivation and poor insight.    Group Time: 2:15-3:30  Participation Level: Minimal  Behavioral Response: Rigid, Rationalizing, Resistant, Evasive and Minimizing  Type of Therapy: Education and Training Group  Topic: Relapse prevention and recovery   Summary: Pts completed 2 handouts about the recovery and relapse prevention process. They shared about their motivations to maintain abstinence from AOD and also about consequences of using and not using. Stacy Poole was honest that she has very little motivation to stay off of mind altering chemicals after completing the CDIOP group and returning to school.    Family Program: Family present? No   Name of family member(s): 0  UDS collected: No Results: negative  AA/NA attended?: YesThursday  Sponsor?: No   Stacy Poole, Stacy Poole, COUNS

## 2011-11-17 ENCOUNTER — Other Ambulatory Visit (HOSPITAL_COMMUNITY): Payer: 59 | Admitting: Psychology

## 2011-11-20 ENCOUNTER — Other Ambulatory Visit (HOSPITAL_COMMUNITY): Payer: 59 | Admitting: Psychology

## 2011-11-20 NOTE — Progress Notes (Signed)
    Daily Group Progress Note  Program: CD-IOP   Group Time: 1-2:30 pm  Participation Level: Minimal  Behavioral Response: Appropriate  Type of Therapy: Psycho-education Group  Topic: "Flight": In session today group members watched the film, "Flight". The story revolves around an airline pilot who is an alcoholic and addict. He is piloting a plane that suffers a mechanical breakdown, but manages to land the plane miraculously. The film accurately and graphically displays his obsession with alcohol and drugs and compulsive use. The film was halted at various times and members shared their own experiences similar to what was portrayed on the screen as well as commented on the characters in the film. The session proved powerful for the group and members who had seen the film previously, agreed it was even more powerful viewing it for the second time.   Group Time: 2:45- 4pm  Participation Level: Active  Behavioral Response: Sharing  Type of Therapy: Psycho-education Group  Topic: Flight continued   Summary: The patient had not seen the film and was interested in the story. She admitted she was terrified of flying and the "crash" scene was very frightening to her. She added her comments about some of the main character's behaviors and agreed that the film was very accurate in portraying the insanity and pain of addiction.   Family Program: Family present? No   Name of family member(s):   UDS collected: No Results:   AA/NA attended?: Yes  Sponsor?: No   Drema Eddington, LCAS

## 2011-11-21 NOTE — Progress Notes (Signed)
    Daily Group Progress Note  Program: CD-IOP   Group Time: 1-2:30 pm  Participation Level: Minimal  Behavioral Response: Appropriate  Type of Therapy: Psycho-education Group  Topic: Self-Care in Early Recovery; Presentation on the importance of self-care in early recovery was provided. Included in this presentation was a handout that consisted of a check-list worksheet. Members were asked to rate the frequency of many different areas of their life. Members identified different elements of self-care and those were recorded on the board. There was a wide variety of responses to questions on self-care and it was clear that most group member's stability in early recovery would benefit from an increased emphasis on their self-care.  Group Time: 2:45- 4pm  Participation Level: Active  Behavioral Response: Sharing  Type of Therapy: Process Group  Topic: Process; second half of group spent in process. Members shared some of their current struggles and challenges. Members who had not shared in the first part of group were asked to check-in. A PA from [facility] who is completing her rotation was also present in group today and she provided good insight and information relevant to the group discussion.   Summary: The patient shared that she is having a real problem with her sleep schedule and admitted she goes to bed very late and gets up very late. The group provided ideas about how she might address and begin changing this pattern. She also noted that she usually ran, but hadn't for about 3 weeks. I pointed out that this might represent a 'red flag' since she had changed a pattern that had proven helpful and therapeutic to her. The patient couldn't explain why she had stopped running. In process, she reported that she slept a lot in part because she could control her dreams. Last night she reported dreaming she had travelled to [place] and had met with the creatures there. She also reported she  is getting along a lot better with her mother than she had in the past. The group applauded this news and encouraged her to continue to address her relationship with mother.    Family Program: Family present? No   Name of family member(s):   UDS collected: No Results:   AA/NA attended?: No  Sponsor?: No   Obinna Ehresman, LCAS

## 2011-11-22 ENCOUNTER — Other Ambulatory Visit (HOSPITAL_COMMUNITY): Payer: 59 | Admitting: Psychology

## 2011-11-22 DIAGNOSIS — F1920 Other psychoactive substance dependence, uncomplicated: Secondary | ICD-10-CM

## 2011-11-23 LAB — PRESCRIPTION ABUSE MONITORING 17P, URINE
Amphetamine/Meth: NEGATIVE ng/mL
Barbiturate Screen, Urine: NEGATIVE ng/mL
Benzodiazepine Screen, Urine: NEGATIVE ng/mL
Buprenorphine, Urine: NEGATIVE ng/mL
Cannabinoid Scrn, Ur: NEGATIVE ng/mL
Carisoprodol, Urine: NEGATIVE ng/mL
Cocaine Metabolites: NEGATIVE ng/mL
Fentanyl, Ur: NEGATIVE ng/mL
Meperidine, Ur: NEGATIVE ng/mL
Opiate Screen, Urine: NEGATIVE ng/mL
Tapentadol, urine: NEGATIVE ng/mL
Tramadol Scrn, Ur: NEGATIVE ng/mL

## 2011-11-23 LAB — ALCOHOL METABOLITE (ETG), URINE: Ethyl Glucuronide (EtG): NEGATIVE ng/mL

## 2011-11-23 NOTE — Progress Notes (Signed)
    Daily Group Progress Note  Program: CD-IOP   Group Time: 1-2:30 pm  Participation Level: Minimal  Behavioral Response: Appropriate  Type of Therapy: Psycho-education Group  Topic:Triggers and Cravings: A PowerPoint presentation was provided on triggers and cravings. Information presented included clarification of the Limbic system and the power of conditioning in addiction. The process or development of an addiction was identified with emphasis on the growth of triggers prior to actual drugging or drinking. The presentation invited group disclosure and discussion and members shared about some of their own experiences along the process towards addiction.    Group Time: 2:45- 4pm  Participation Level: Minimal  Behavioral Response: Sharing  Type of Therapy: Process Group  Topic: Process: second half of group was spent in process. Included was a 10 minute guided imagery reading. Group members shared about their current struggles and concerns and there was good sharing among members.   Summary: The patient reported her drinking had increased dramatically when she went to college in [place]. She also insisted that 'everybody' at [facility] is an alcoholic. She described the ego experience of being drunk in class and being lauded by the professor. Other members agreed that this sort of experience provided "confidence" to one's belief around the benefits of drinking. In process, the patient reported that when one is receiving affection and affirmation it causes our brains to produce natural endorphins that make [place] feel good. She noted this in response to another group member's angst about her family not validating her progress in early recovery. She responded well to this intervention.    Family Program: Family present? No   Name of family member(s):   UDS collected: Yes Results:not available  AA/NA attended?: YesTuesday  Sponsor?: No   Azana Kiesler, LCAS

## 2011-11-24 ENCOUNTER — Other Ambulatory Visit (HOSPITAL_COMMUNITY): Payer: 59

## 2011-11-24 ENCOUNTER — Encounter (HOSPITAL_COMMUNITY): Payer: Self-pay | Admitting: Psychology

## 2011-11-27 ENCOUNTER — Other Ambulatory Visit (HOSPITAL_COMMUNITY): Payer: 59 | Admitting: Psychology

## 2011-11-27 DIAGNOSIS — F1920 Other psychoactive substance dependence, uncomplicated: Secondary | ICD-10-CM

## 2011-11-27 NOTE — Progress Notes (Signed)
Patient ID: Stacy Poole, female   DOB: [birth year], 21 y.o.   MRN: [id number] The patient did not appear for group today nor did she phone to explain her absence. Will wait to speak with her on Monday. This represents an unexcused absence.

## 2011-11-28 LAB — PRESCRIPTION ABUSE MONITORING 17P, URINE
Benzodiazepine Screen, Urine: NEGATIVE ng/mL
Buprenorphine, Urine: NEGATIVE ng/mL
Cannabinoid Scrn, Ur: NEGATIVE ng/mL
Cocaine Metabolites: NEGATIVE ng/mL
Creatinine, Urine: 25.5 mg/dL
MDMA URINE: NEGATIVE ng/mL
Meperidine, Ur: NEGATIVE ng/mL
Methadone Screen, Urine: NEGATIVE ng/mL
Opiate Screen, Urine: NEGATIVE ng/mL
Tapentadol, urine: NEGATIVE ng/mL
Tramadol Scrn, Ur: NEGATIVE ng/mL

## 2011-11-28 LAB — ALCOHOL METABOLITE (ETG), URINE: Ethyl Glucuronide (EtG): NEGATIVE ng/mL

## 2011-11-29 ENCOUNTER — Other Ambulatory Visit (HOSPITAL_COMMUNITY): Payer: 59 | Admitting: Psychology

## 2011-11-30 NOTE — Progress Notes (Signed)
    Daily Group Progress Note  Program: CD-IOP   Group Time: 1-2:30 pm  Participation Level: Minimal  Behavioral Response: Appropriate  Type of Therapy: Psycho-education Group  Topic: Denial: a presentation was provided on the reasons or purposes of denial. These include the positive benefit of allowing one to consider or ponder on a crisis as well as the negative or destructive aspects of denial, especially as seen in addiction. The negative consequences of denial, especially the avoidance of problems and issues that aren't going away was discussed. Group members shared their own stories about denial in their lives and the times when they were in denial about their addiction. There was good disclosure among group members and the issue of denial examined closely.  Group Time: 2:45- 4pm  Participation Level: Active  Behavioral Response: Sharing  Type of Therapy: Process Group  Topic: Process; the second half of group was spent in process. Members discussed current issues and concerns. There were 2 new group members and they were asked to share about what has brought them to the group? One member was suffering from withdrawal and the group encouraged her to enter detox and shared their own experiences in a medically-monitored detox setting.  Summary: The patient reported that she had had a good weekend. She reported she is feeling very overwhelmed by school requirements from the fall and her hope to get that semester cancelled due to medical issues. The patient did not express past concerns about denial. The patient encouraged the new group member to go to detox, "but don't go to "[place]", where she had reportedly had a terrible experience. The patient reports she has remained abstinent and all random drug tests confirm this report.  Family Program: Family present? No   Name of family member(s):   UDS collected: Yes Results:  AA/NA attended?: No  Sponsor?: No   Stacy Poole,  LCAS

## 2011-12-01 ENCOUNTER — Other Ambulatory Visit (HOSPITAL_COMMUNITY): Payer: 59

## 2011-12-02 NOTE — Progress Notes (Signed)
    Daily Group Progress Note  Program: CD-IOP   Group Time: 1-2:30 pm  Participation Level: Minimal  Behavioral Response: Appropriate  Type of Therapy: Psycho-education Group  Topic:Self-Esteem: a presentation was provided on improving and strengthening one's self-esteem. The damage that addiction causes to one's sense of self was discussed and members agreed that the guilt and shame they have felt for their actions have made them feel very badly about themselves. A list of things one can do to begin to build esteem was discussed with members writing down their responses to each of suggestion offered. The ability to identify one's strengths or positive attributes differed among the group. Some of the members with the lowest self-esteem seemed unable to identify their good qualities while others could list many positive attributes.    Group Time: 2:45- 4pm  Participation Level: Minimal  Behavioral Response: Rationalizing and Grandiose  Type of Therapy: Process Group  Topic:Group Process: the second half of group was spent in process. Members discussed their current issues and struggles. The group identified their plans for the upcoming Easter holiday and those plans that support and validate an abstinence-based lifestyle.   Summary: The patient shared her feelings about going back to school and whether she would be able to remain sober and drug-free while almost all her friends continue to use. The patient tends to exaggerate many of her observations as when she noted that "everybody at [facility] is an addict". The patient has difficulty staying on task and had to be redirected this afternoon. Family Program: Family present? No   Name of family member(s):   UDS collected: No Results:  AA/NA attended?: No  Sponsor?: No   Evyn Kooyman, LCAS

## 2011-12-04 ENCOUNTER — Other Ambulatory Visit (HOSPITAL_COMMUNITY): Payer: 59 | Attending: Physician Assistant | Admitting: Psychology

## 2011-12-04 DIAGNOSIS — F29 Unspecified psychosis not due to a substance or known physiological condition: Secondary | ICD-10-CM | POA: Insufficient documentation

## 2011-12-04 DIAGNOSIS — Z79899 Other long term (current) drug therapy: Secondary | ICD-10-CM | POA: Insufficient documentation

## 2011-12-04 DIAGNOSIS — F449 Dissociative and conversion disorder, unspecified: Secondary | ICD-10-CM | POA: Insufficient documentation

## 2011-12-04 DIAGNOSIS — F1020 Alcohol dependence, uncomplicated: Secondary | ICD-10-CM | POA: Insufficient documentation

## 2011-12-04 DIAGNOSIS — F4310 Post-traumatic stress disorder, unspecified: Secondary | ICD-10-CM | POA: Insufficient documentation

## 2011-12-04 DIAGNOSIS — Z888 Allergy status to other drugs, medicaments and biological substances status: Secondary | ICD-10-CM | POA: Insufficient documentation

## 2011-12-04 DIAGNOSIS — F411 Generalized anxiety disorder: Secondary | ICD-10-CM | POA: Insufficient documentation

## 2011-12-05 LAB — PRESCRIPTION ABUSE MONITORING 17P, URINE
Benzodiazepine Screen, Urine: NEGATIVE ng/mL
Cannabinoid Scrn, Ur: NEGATIVE ng/mL
Creatinine, Urine: 58.8 mg/dL (ref >20.0)
MDMA URINE: NEGATIVE ng/mL
Meperidine, Ur: NEGATIVE ng/mL
Methadone Screen, Urine: NEGATIVE ng/mL
Opiate Screen, Urine: NEGATIVE ng/mL
Propoxyphene: NEGATIVE ng/mL
Tapentadol, urine: NEGATIVE ng/mL
Zolpidem, Urine: NEGATIVE ng/mL

## 2011-12-05 LAB — ALCOHOL METABOLITE (ETG), URINE: Ethyl Glucuronide (EtG): NEGATIVE ng/mL

## 2011-12-05 NOTE — Progress Notes (Signed)
    Daily Group Progress Note  Program: CD-IOP   Group Time: 1-2:30 pm  Participation Level: Minimal  Behavioral Response: resistant and minimizing  Type of Therapy: Process Group  Topic:Group Process: First half of group spent in process. Members shared about the holiday weekend and their experiences in early sobriety. One member had returned after having to go upstairs into detox last week. She received good support and validation and admitted she felt much better. There was a new group member present and she introduced herself and shared about her struggles with alcohol. There was good disclosure and feedback among the group.   Group Time: 2:45- 4pm  Participation Level: Minimal  Behavioral Response: Sharing  Type of Therapy: Psycho-education Group  Topic:Alcohol and its Effects: Second half of group included a PowerPoint presentation on the effects of alcohol on one's body. There was a detailed explanation of how the body metabolizes alcohol and the effects of excessive alcohol on the various parts of the body, including liver, heart and brain. Group members seemed stunned to hear about some of these damaging and irreversible effects of alcohol. A good discussion accompanied the presentation.   Summary: The patient reported she had had a good holiday weekend. When asked about attending meetings, she admitted she has not gone to one in over 2 weeks. When asked why she hadn't been going, the patient reported she "didn't like them". I reminded the group of the importance of building a support network and reminded them that boredom and isolation are 2 of the most common explanations for relapse. The patient was surprised to hear about some of the damage that alcohol can do to the drinker and agreed with another member that she didn't like hearing about it. The patient has no support for her recovery beyond this program and it is very likely that she will fall back into her old habits once  she returns to her "old life".   Family Program: Family present? No   Name of family member(s):   UDS collected: Yes Results: not available  AA/NA attended?: No  Sponsor?: No   Norvil Martensen, LCAS

## 2011-12-06 ENCOUNTER — Other Ambulatory Visit (HOSPITAL_COMMUNITY): Payer: 59 | Admitting: Psychology

## 2011-12-07 NOTE — Progress Notes (Signed)
    Daily Group Progress Note  Program: CD-IOP   Group Time: 1-2:30 pm  Participation Level: Minimal  Behavioral Response: Rationalizing, Resistant and Evasive  Type of Therapy: Psycho-education Group  Topic: "The Importance of Taking the Medicine". First half of group was a presentation on doing what one must do to live with a chronic illness. The importance of "taking the medicine", i.e., making major behavioral changes in one's daily life was emphasized. While some people maintain they can "think" their way through their addiction, most of those same people have demonstrated they can't by relapsing. Group members were asked to identify the "medication" of recovery. It includes: attending meetings, working the steps with sponsor, fellowship, prayer and reflection, taking one's medication (literally), exercise and a healthy diet. Members shared their own efforts to "do it my way" and in each instance, they had failed miserably.   Group Time: 2:45- 4pm  Participation Level: Minimal  Behavioral Response: Sharing and Minimizing  Type of Therapy: Process Group  Topic: Opiates: a PowerPoint presentation and ensuing discussion followed the break. A brief overview was provided on the history of opioids and current drugs of abuse. One of the members recently observed her 90th day of sobriety from IV drug use and she shared openly about her heroin use. The progressive nature of addiction was identified and the changes in behaviors - all based on obtaining and using the drugs. The session was well-received with good feedback among the group.  Summary: The patient admitted when I asked, that she had not run in 2 weeks. I wondered what she did with all her free time? She explained that she has a terrible problem with sleep and doesn't get up until about 11 am in the morning and then her day is shot. I pointed out that she has 9 hours of sunlight and should be able to find a time to run. She continued  to provide excuses for her lack of any behavioral changes in her daily life. She has good attendance for group, but has not attended any AA meetings in 2 weeks, "because I don't like them". I pointed out that if she goes back to school in [place] without having made any substantive changes, she will surely be drinking in a short while. When she admitted that she wouldn't drink again after that terrible experience in detox, some group members laughed and pointed out they had said the same thing, but then entered a second detox. The patient has poor  insight and has made very little effort to make the changes emphasized in this program.   Family Program: Family present? No   Name of family member(s):   UDS collected: No Results:   AA/NA attended?: No  Sponsor?: No   Deng Kemler, LCAS

## 2011-12-08 ENCOUNTER — Other Ambulatory Visit (HOSPITAL_COMMUNITY): Payer: 59 | Admitting: Psychology

## 2011-12-11 ENCOUNTER — Other Ambulatory Visit (HOSPITAL_COMMUNITY): Payer: 59 | Admitting: Psychology

## 2011-12-12 NOTE — Progress Notes (Signed)
    Daily Group Progress Note  Program: CD-IOP   Group Time: 1-2:30 pm  Participation Level: Minimal  Behavioral Response: Appropriate  Type of Therapy: Psycho-education Group  Topic: The Stimulant Drugs: a PowerPoint presentation was provided on the category of drugs known as stimulants. The focus was on cocaine and methamphetamine. Included in the session was a detailed description of the characteristics or symptoms of these drugs and the physiological and psychological changes they cause. There was a lively discussion among the group during the presentation as a number of members had been very heavy stimulant users, including cocaine and adder all. It proved to be a very informative session.  Group Time: 2:45- 4pm  Participation Level: Minimal  Behavioral Response: Sharing and Resistant  Type of Therapy: Process Group  Topic:Process and Graduation: second half of group was spent in process. Members shared some of their current struggles and concerns. As the session neared the end, a graduation ceremony was held honoring 2 members who were successfully completing treatment today. There were kind words and a few tears and members said their good-byes and well wishes.   Summary:The patient shared little during the presentation on stimulants. She shared some words of hope and appreciation during the graduation ceremony. The patient has remained alcohol-free, but is not changing any significant behaviors and not attending any support groups.   Family Program: Family present? No   Name of family member(s):   UDS collected: No Results:  AA/NA attended?: No  Sponsor?: No   Etheridge Geil, LCAS

## 2011-12-12 NOTE — Progress Notes (Signed)
    Daily Group Progress Note  Program: CD-IOP   Group Time: 1-2:30 pm  Participation Level: Minimal  Behavioral Response: Evasive  Type of Therapy: Psycho-education Group  Topic:Acknowledging Desires to Use: first half of group spent discussing the importance of talking about what one is feeling and experiencing. One member shared about her desire to get high and how there are triggers that cause her to physically experience the sensations associated with opiate use. The new group member was surprised that she expressed herself and shared these desires to easily. He believed that by talking about them he would be compelled to use. The importance of not holding secrets in, but rather expressing them openly with others in recovery was emphasized. Members shared their own experiences about dealing with drug-using thoughts, dreams, and cravings.    Group Time: 2:45- 4pm  Participation Level: Minimal  Behavioral Response: Sharing  Type of Therapy: Process Group  Topic: Group Process: second half of group spent in process. Members talked about their current struggles and issues of concern in early recovery. There was good disclosure and feedback.   Summary: The patient reported she had remained sober over the weekend. When asked what she does over the course of the day, she admitted she tries to focus on the schoolwork that she is so far behind in. The patient explained she is trying to complete class work that is currently an "Incomplete". She admitted she has a difficult time concentrating and when her sister arrives home at 4:30 pm, they typically spend the rest of the day together. The patient admitted she is not attending any 12-step meetings. 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  Family Program: Family present? No   Name of family member(s):   UDS collected: No Results:  AA/NA attended?: No  Sponsor?:  No   Gaylin Bulthuis, LCAS

## 2011-12-13 ENCOUNTER — Other Ambulatory Visit (HOSPITAL_COMMUNITY): Payer: 59 | Admitting: Psychology

## 2011-12-15 ENCOUNTER — Other Ambulatory Visit (HOSPITAL_COMMUNITY): Payer: 59 | Admitting: Psychology

## 2011-12-15 NOTE — Progress Notes (Signed)
    Daily Group Progress Note  Program: CD-IOP   Group Time: 1-2:30 pm  Participation Level: Minimal  Behavioral Response: did not share voluntarily in visit with chaplain  Type of Therapy: Psycho-education Group  Topic: Chaplain: first half of group was spent with the visiting Chaplain. He talked about Hope and the group was asked to share their feelings about hope. The conversation steered to how one finds hope while struggling with a loss of hope. There was good disclosure among group members and sharing about how they have found reasons to go on when there weren't any.    Group Time: 2:45- 4pm  Participation Level: Minimal  Behavioral Response: Sharing and Minimizing  Type of Therapy: Process Group  Topic: Group Process: the second half of group was spent in process. Members shared their current struggles and issues in early recovery. There was good discussion and feedback among members.  Summary: The patient offered little of herself or thoughts during the chaplain's visit. When he asked members to draw a mandala, she wasn't willing to show the group her drawing, but explained it was of the reflecting pond across from the [facility] and how she would go there as the sun rose. She admitted she hadn't been there in a long time, but the chaplain pointed out that it must have meant something to her at one time in her life. The patient remains sober and will be graduating next week. 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   Family Program: Family present? No   Name of family member(s):   UDS collected: No Results:   AA/NA attended?: No  Sponsor?: No   Dora Simeone, LCAS

## 2011-12-16 LAB — PRESCRIPTION ABUSE MONITORING 17P, URINE
Buprenorphine, Urine: NEGATIVE ng/mL
Creatinine, Urine: 50.96 mg/dL (ref >20.0)
MDMA URINE: NEGATIVE ng/mL
Methadone Screen, Urine: NEGATIVE ng/mL
Oxycodone Screen, Ur: NEGATIVE ng/mL
Propoxyphene: NEGATIVE ng/mL
Zolpidem, Urine: NEGATIVE ng/mL

## 2011-12-16 LAB — ALCOHOL METABOLITE (ETG), URINE: Ethyl Glucuronide (EtG): NEGATIVE ng/mL

## 2011-12-18 ENCOUNTER — Encounter (HOSPITAL_COMMUNITY): Payer: Self-pay | Admitting: Psychology

## 2011-12-18 ENCOUNTER — Other Ambulatory Visit (HOSPITAL_COMMUNITY): Payer: 59

## 2011-12-18 NOTE — Progress Notes (Signed)
    Daily Group Progress Note  Program: CD-IOP   Group Time: 1-2:30 pm  Participation Level: Minimal  Behavioral Response: minimizing and rationalizing  Type of Therapy: Psycho-education Group  Topic:Dealing with Cravings: a presentation was provided on dealing with cravings. Experiencing cravings is very typical in early recovery and newly recovering people must learn to deal with them and not succumb to using. Identifying triggers and learning to minimize potential cravings was emphasized, but at other times, cravings just seem to come out of the blue. The time-limited nature of cravings were discussed and members shared what they do when they are craving. There was good disclosure and members provided good feedback to each other.   Group Time: 2:45- 4pm  Participation Level: Minimal  Behavioral Response: Resistant  Type of Therapy: Process Group  Topic: Group Process: Second half of group was spent in process. Members shared about their current struggles and concerns. Members provided examples of things they have done in certain situations and how they had experienced similar events. There was good feedback among the group.  Summary: The patient reported she had spoken to a friend back at school. He had told her that her roommate continues to drink heavily. The patient spoke about her plans to return to [place] where she is in college. Another group member admitted that he was concerned for her and questioned how she would stay sober around all of those triggers and old drinking buddies? She insisted that she would not drink. She reminded the group that she continues to complete homework for the "incompletes" that she has having left school this past spring because of her drinking. The patient appears to be minimizing the power of her addiction and shrugs off concerns about the fragility of her recovery.   Family Program: Family present? No   Name of family member(s):   UDS collected:  Yes Results:   AA/NA attended?: No  Sponsor?: No   Cj Edgell, LCAS

## 2011-12-19 NOTE — Progress Notes (Signed)
    Daily Group Progress Note  Program: CD-IOP   Group Time: 1-2:30 pm  Participation Level: None  Behavioral Response: disinterested  Type of Therapy: Psycho-education Group  Topic: Open and Honest: the importance of being open and honest in group was discussed at length this afternoon. If one is to make changes and progress towards an abstinence-based lifestyle, it requires the recovering individual to become very open about what he/she is experiencing on a daily basis and being able to express that to others. It was pointed out that while some of the group members are extremely 'transparent', others do not disclose any information of depth. I emphasized that one will only benefit from the group as much as they are willing to give of themselves. The session went quite well with a number of members sharing about themselves to a degree that had been sorely lacking.   Group Time: 2:45- 4pm  Participation Level: Minimal  Behavioral Response: Minimizing  Type of Therapy: Process Group  Topic:Group Process: members shared about their current issues and concerns. One member complained about his family's inability or refusal to acknowledge his progress. This complaint resonated with other group members. Members talked about their triggers and learning how to identify them.    Summary: The patient was quiet for a while, but seemed to awaken as I expressed my concerns about her upcoming graduation despite not making any clear changes in her daily lifestyle. 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 I pointed out that the patient is scheduled to graduate on Wednesday and that there will be brownies! She shared little else of her feelings or concerns, which is congruent with her imminent departure from the group and program.   Family Program: Family present? No   Name of family member(s):    UDS collected: No Results:   AA/NA attended?: No  Sponsor?: No   Joliene Salvador, LCAS

## 2011-12-19 NOTE — Progress Notes (Incomplete)
    Daily Group Progress Note  Program: CD-IOP   Group Time: 1-2:30 pm  Participation Level: Minimal  Behavioral Response: appeared disinterested  Type of Therapy: Psycho-education Group  Topic:      Group Time: 2:45- 4pm Participation Level: Minimal  Behavioral Response: Rationalizing and Resistant  Type of Therapy: Process Group  Topic:    Summary: The patient was quiet for awhile, but seemed to awaken as I expressed my concerns about her upcoming graduation despite not making any clear changes in her daily lifestyle. 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 I pointed out that the patient is scheduled to graduate on Wednesday and that there will be brownies! She shared little else of her feelings or concerns, which is congruent with her imminent departure from the group and program.   Family Program: Family present? No   Name of family member(s):   UDS collected: No Results:  AA/NA attended?: No  Sponsor?: No   Corwyn Vora, LCAS

## 2011-12-20 ENCOUNTER — Other Ambulatory Visit (HOSPITAL_COMMUNITY): Payer: 59

## 2011-12-22 ENCOUNTER — Other Ambulatory Visit (HOSPITAL_COMMUNITY): Payer: 59

## 2011-12-23 ENCOUNTER — Other Ambulatory Visit (HOSPITAL_COMMUNITY): Payer: 59

## 2011-12-25 ENCOUNTER — Other Ambulatory Visit (HOSPITAL_COMMUNITY): Payer: 59

## 2011-12-27 ENCOUNTER — Other Ambulatory Visit (HOSPITAL_COMMUNITY): Payer: 59

## 2012-02-28 ENCOUNTER — Ambulatory Visit (INDEPENDENT_AMBULATORY_CARE_PROVIDER_SITE_OTHER): Payer: 59 | Admitting: Family Medicine

## 2012-02-28 ENCOUNTER — Ambulatory Visit: Payer: 59

## 2012-02-28 VITALS — BP 91/60 | HR 48 | Temp 98.7°F | Resp 16 | Ht 63.5 in | Wt 122.0 lb

## 2012-02-28 DIAGNOSIS — M79672 Pain in left foot: Secondary | ICD-10-CM

## 2012-02-28 DIAGNOSIS — M7750 Other enthesopathy of unspecified foot: Secondary | ICD-10-CM

## 2012-02-28 DIAGNOSIS — M79609 Pain in unspecified limb: Secondary | ICD-10-CM

## 2012-02-28 NOTE — Patient Instructions (Addendum)
Wear the post-op shoe on your foot for the next week. If you're feeling better, gradually ease back into activity like we discussed. Come back and see either myself or Dr. Neva Seat next week (Sports Medicine) so we can make sure you're doing okay.

## 2012-02-28 NOTE — Progress Notes (Signed)
Patient ID: Stacy Poole, female   DOB: 02-23-1991, 21 y.o.   MRN: [id number] Stacy Poole is a 21 y.o. female who presents to Urgent Care today for Left foot pain:  1.  Left foot pain:  Patient is long-term runner for the past 5-6 months. We'll she just joined an adult soft relief and had her first came on Thursday which was about 5 days ago. She felt fine during the game but did not have any accidents or injuries but about an hour or 2 after the game began experiencing sharp pain in her left foot. This continued through the night and the next day until she was limping. She attempted to play and again the following day but was in too much pain to complete the game.  She's had persistent pain since last Thursday. She has never had an injury to her foot before that she knows of. She describes the pain is right at the base of her toe. Describes it as a dull aching pain that is sometimes sharp when she walks upon it. No swelling or discoloration she is noted. No recent illnesses.  PMH reviewed.  ROS as above otherwise neg.  No chest pain, palpitations, SOB, Fever, Chills, Abd pain, N/V/D.  Medications reviewed. Current Outpatient Prescriptions  Medication Sig Dispense Refill  . acetaZOLAMIDE (DIAMOX) 125 MG tablet Take 500 mg by mouth 2 (two) times daily. Unknown dosage, mother will verify.      . fish oil-omega-3 fatty acids 1000 MG capsule Take 1 g by mouth 3 (three) times daily.      Marland Kitchen lamoTRIgine (LAMICTAL) 25 MG tablet Take 25 mg by mouth daily.      Marland Kitchen ALPRAZolam (XANAX) 0.5 MG tablet Take 0.5 mg by mouth 2 (two) times daily as needed. For anxiety      . amoxicillin-clavulanate (AUGMENTIN) 875-125 MG per tablet Take 1 tablet by mouth 2 (two) times daily.      . cholecalciferol (VITAMIN D) 1000 UNITS tablet Take 1,000 Units by mouth daily.      . fluticasone (VERAMYST) 27.5 MCG/SPRAY nasal spray Place 3 sprays into the nose daily.       . Ginger, Zingiber officinalis, (GINGER PO) Take by  mouth.      Marland Kitchen HYDROcodone-acetaminophen (VICODIN) 5-500 MG per tablet Take 1 tablet by mouth every 6 (six) hours as needed.      . vitamin E (VITAMIN E) 400 UNIT capsule Take 400 Units by mouth daily.        Exam:  BP 91/60  Pulse 48  Temp 98.7 F (37.1 C) (Oral)  Resp 16  Ht 5' 3.5" (1.613 m)  Wt 122 lb (55.339 kg)  BMI 21.27 kg/m2  LMP 02/07/2012 Gen: Well NAD MSK:  Right foot WNL. Left foot:  No erythema or swelling noted.  No pain over 5th metarsal or head of 5th metatarsal or navicular.  Tender to palpation just proximal to 1st MTP joint, mild tenderness. No pain with dorsiflexion/plantarflexion of foot.   Able to bear weight and walk in clinic barefoot without pain.  No tenderness other metatarsals.      [facility] reading (PRIMARY) by  Dr. Netta Neat:  No acute fractures.  Does exhibit old avulsion injury to Left 5th metatarsal.     Assessment and Plan:  1.  Tendonitis:  Much less likely to be stress fracture or acute fracture as she is about 4 days post injury and radiographs are negative.  No red flags on exam or  by history.  Plan to treat with rest, ice, compression, NSAIDs, post-op shoe x 1 week then gradual return to play.  FU in 1 week for recheck to make sure she is improving.

## 2013-04-23 ENCOUNTER — Encounter: Payer: Self-pay | Admitting: Obstetrics & Gynecology

## 2013-04-23 ENCOUNTER — Telehealth: Payer: Self-pay | Admitting: Obstetrics & Gynecology

## 2013-04-23 NOTE — Telephone Encounter (Signed)
Call to patient to r/s aex appt. She is scheduled for 9/1. The office is closed for Labor day. Rescheduled the patient for 9/8 @ 2:45. Sending letter since no one answered and no voicemail was set up. Cell # disconnected. Letter sent to patient in regards to her new appointment date and time.

## 2013-05-05 ENCOUNTER — Ambulatory Visit: Payer: 59 | Admitting: Obstetrics & Gynecology

## 2013-05-12 ENCOUNTER — Ambulatory Visit: Payer: 59 | Admitting: Obstetrics & Gynecology

## 2013-05-12 ENCOUNTER — Encounter: Payer: Self-pay | Admitting: Obstetrics & Gynecology

## 2013-06-05 ENCOUNTER — Telehealth: Payer: Self-pay | Admitting: Neurology

## 2013-06-06 ENCOUNTER — Telehealth: Payer: Self-pay | Admitting: *Deleted

## 2013-06-06 ENCOUNTER — Ambulatory Visit: Payer: Self-pay | Admitting: Neurology

## 2013-06-06 NOTE — Telephone Encounter (Signed)
Spoke with patients mother to schedule appt this morning. They called back and said they wouldn't be able to come today wanted Monday. Appoint ment was rescheduled for Monday with the patient

## 2013-06-09 ENCOUNTER — Ambulatory Visit: Payer: Self-pay | Admitting: Neurology

## 2013-07-21 NOTE — Telephone Encounter (Signed)
appt rescheduled.

## 2013-08-22 ENCOUNTER — Encounter (HOSPITAL_COMMUNITY): Payer: Self-pay | Admitting: Emergency Medicine

## 2013-08-22 ENCOUNTER — Emergency Department (HOSPITAL_COMMUNITY): Payer: 59

## 2013-08-22 ENCOUNTER — Emergency Department (HOSPITAL_COMMUNITY)
Admission: EM | Admit: 2013-08-22 | Discharge: 2013-08-22 | Disposition: A | Discharge status: Home / Self Care | Payer: 59 | Attending: Emergency Medicine | Admitting: Emergency Medicine

## 2013-08-22 DIAGNOSIS — F17200 Nicotine dependence, unspecified, uncomplicated: Secondary | ICD-10-CM | POA: Insufficient documentation

## 2013-08-22 DIAGNOSIS — Z3202 Encounter for pregnancy test, result negative: Secondary | ICD-10-CM | POA: Insufficient documentation

## 2013-08-22 DIAGNOSIS — R259 Unspecified abnormal involuntary movements: Secondary | ICD-10-CM

## 2013-08-22 DIAGNOSIS — Z79899 Other long term (current) drug therapy: Secondary | ICD-10-CM | POA: Insufficient documentation

## 2013-08-22 DIAGNOSIS — R51 Headache: Secondary | ICD-10-CM | POA: Insufficient documentation

## 2013-08-22 DIAGNOSIS — G4700 Insomnia, unspecified: Secondary | ICD-10-CM | POA: Insufficient documentation

## 2013-08-22 DIAGNOSIS — F419 Anxiety disorder, unspecified: Secondary | ICD-10-CM

## 2013-08-22 DIAGNOSIS — R52 Pain, unspecified: Secondary | ICD-10-CM | POA: Insufficient documentation

## 2013-08-22 DIAGNOSIS — F411 Generalized anxiety disorder: Secondary | ICD-10-CM | POA: Insufficient documentation

## 2013-08-22 DIAGNOSIS — Z791 Long term (current) use of non-steroidal anti-inflammatories (NSAID): Secondary | ICD-10-CM | POA: Insufficient documentation

## 2013-08-22 DIAGNOSIS — H579 Unspecified disorder of eye and adnexa: Secondary | ICD-10-CM | POA: Insufficient documentation

## 2013-08-22 DIAGNOSIS — M542 Cervicalgia: Secondary | ICD-10-CM | POA: Insufficient documentation

## 2013-08-22 DIAGNOSIS — G40909 Epilepsy, unspecified, not intractable, without status epilepticus: Secondary | ICD-10-CM | POA: Insufficient documentation

## 2013-08-22 DIAGNOSIS — Z8619 Personal history of other infectious and parasitic diseases: Secondary | ICD-10-CM | POA: Insufficient documentation

## 2013-08-22 LAB — CBC WITH DIFFERENTIAL/PLATELET
Basophils Absolute: 0 10*3/uL (ref 0.0–0.1)
Basophils Relative: 1 % (ref 0–1)
Eosinophils Absolute: 0.3 10*3/uL (ref 0.0–0.7)
Eosinophils Relative: 4 % (ref 0–5)
Hemoglobin: 14 g/dL (ref 12.0–15.0)
MCH: 29.8 pg (ref 26.0–34.0)
MCHC: 34.2 g/dL (ref 30.0–36.0)
MCV: 87 fL (ref 78.0–100.0)
Monocytes Absolute: 0.6 10*3/uL (ref 0.1–1.0)
Monocytes Relative: 8 % (ref 3–12)
Neutro Abs: 3.5 10*3/uL (ref 1.7–7.7)
Neutrophils Relative %: 46 % (ref 43–77)
RDW: 13.3 % (ref 11.5–15.5)

## 2013-08-22 LAB — URINALYSIS, ROUTINE W REFLEX MICROSCOPIC
Bilirubin Urine: NEGATIVE
Ketones, ur: NEGATIVE mg/dL
Leukocytes, UA: NEGATIVE
Nitrite: NEGATIVE
Specific Gravity, Urine: 1.023 (ref 1.005–1.030)
Urobilinogen, UA: 0.2 mg/dL (ref 0.0–1.0)

## 2013-08-22 LAB — BASIC METABOLIC PANEL
BUN: 28 mg/dL — ABNORMAL HIGH (ref 6–23)
Creatinine, Ser: 0.97 mg/dL (ref 0.50–1.10)
GFR calc Af Amer: >90 mL/min (ref >90)
GFR calc non Af Amer: 82 mL/min — ABNORMAL LOW (ref >90)
Potassium: 4.6 mEq/L (ref 3.5–5.1)

## 2013-08-22 LAB — POCT PREGNANCY, URINE: Preg Test, Ur: NEGATIVE

## 2013-08-22 MED ORDER — LORAZEPAM 2 MG/ML IJ SOLN
1.0000 mg | Freq: Once | INTRAMUSCULAR | Status: AC
  Administered 2013-08-22: 1 mg via INTRAVENOUS
  Filled 2013-08-22: qty 1

## 2013-08-22 MED ORDER — KETOROLAC TROMETHAMINE 30 MG/ML IJ SOLN
30.0000 mg | Freq: Once | INTRAMUSCULAR | Status: AC
  Administered 2013-08-22: 30 mg via INTRAVENOUS
  Filled 2013-08-22: qty 1

## 2013-08-22 MED ORDER — HYDROCODONE-ACETAMINOPHEN 5-325 MG PO TABS: 1.0000 | ORAL_TABLET | Freq: Four times a day (QID) | ORAL | Status: DC | PRN

## 2013-08-22 MED ORDER — METOCLOPRAMIDE HCL 5 MG/ML IJ SOLN
10.0000 mg | Freq: Once | INTRAMUSCULAR | Status: AC
  Administered 2013-08-22: 10 mg via INTRAVENOUS
  Filled 2013-08-22: qty 2

## 2013-08-22 MED ORDER — LORAZEPAM 1 MG PO TABS: 1.0000 mg | ORAL_TABLET | Freq: Three times a day (TID) | ORAL | Status: DC | PRN

## 2013-08-22 MED ORDER — TRAMADOL HCL 50 MG PO TABS: 50.0000 mg | ORAL_TABLET | Freq: Four times a day (QID) | ORAL | Status: DC | PRN

## 2013-08-22 MED ORDER — CYCLOBENZAPRINE HCL 10 MG PO TABS: 10.0000 mg | ORAL_TABLET | Freq: Two times a day (BID) | ORAL | Status: DC | PRN

## 2013-08-22 MED ORDER — SODIUM CHLORIDE 0.9 % IV BOLUS (SEPSIS)
1000.0000 mL | Freq: Once | INTRAVENOUS | Status: AC
  Administered 2013-08-22: 1000 mL via INTRAVENOUS

## 2013-08-22 MED ORDER — LORAZEPAM 2 MG/ML IJ SOLN
1.0000 mg | Freq: Once | INTRAMUSCULAR | Status: AC
  Administered 2013-08-22: 1 mg via INTRAVENOUS

## 2013-08-22 MED ORDER — SODIUM CHLORIDE 0.9 % IV SOLN
Freq: Once | INTRAVENOUS | Status: AC
  Administered 2013-08-22: 07:00:00 via INTRAVENOUS

## 2013-08-22 MED ORDER — NAPROXEN 500 MG PO TABS: 500.0000 mg | ORAL_TABLET | Freq: Two times a day (BID) | ORAL | Status: DC

## 2013-08-22 NOTE — ED Notes (Signed)
Neurology at beside.

## 2013-08-22 NOTE — ED Notes (Signed)
Dr. Rhunette Croft in to talk with patient. Reports pt will be given another dose of ativan and then patient will get the MRI. Pt reports that she is really tired and hasn't slept in 1 week and is on edge.

## 2013-08-22 NOTE — ED Notes (Addendum)
Pt c/o of sharp pain that radiates from the head down to her back that causes her to have jerking motions that started last night.  Mother states daughter has had difficulty sleeping x1 week.  Daughter c/o of having tingling and continuous twitching in the legs.  Pt denies n/v/d.

## 2013-08-22 NOTE — ED Provider Notes (Signed)
Patient required ativan for MRI. Care turned over to Dr Rae Mar R. Rubin Payor, MD 08/22/13 1550

## 2013-08-22 NOTE — ED Notes (Signed)
EDP at bedside talking to patient and mother. Mother requested to speak to neurologist. EDP states will consult neurology.

## 2013-08-22 NOTE — ED Notes (Signed)
Pt is sleeping. Waiting for transport to MRI. Will give pt another dose of ativan prior to transport.

## 2013-08-22 NOTE — ED Notes (Signed)
Spoke with MRI and will be sending for patient for MRI.  Doctor notified.

## 2013-08-22 NOTE — ED Notes (Signed)
Pt ambulatory to room with family. Waiting on MRI transport. No distress noted. Pt calm and cooperative at this time.

## 2013-08-22 NOTE — ED Notes (Signed)
EDP printed patient's test results and stated will be in shortly to speak with patient. If patient wants to leave she needs to sign out AMA.

## 2013-08-22 NOTE — ED Notes (Signed)
Spoke with EDP who stated Neurology will speak with patient and Mother at bedside. Patient notified.

## 2013-08-22 NOTE — ED Notes (Signed)
EDP at [facility], speaking with pt and family, pt alert, NAD, calm, c/o HA, 5/10. Pt to MRI.

## 2013-08-22 NOTE — ED Provider Notes (Signed)
Results for orders placed during the hospital encounter of 08/22/13  CBC WITH DIFFERENTIAL      Result Value Range   WBC 7.6  4.0 - 10.5 K/uL   RBC 4.70  3.87 - 5.11 MIL/uL   Hemoglobin 14.0  12.0 - 15.0 g/dL   HCT 82.9  56.2 - 13.0 %   MCV 87.0  78.0 - 100.0 fL   MCH 29.8  26.0 - 34.0 pg   MCHC 34.2  30.0 - 36.0 g/dL   RDW 86.5  78.4 - 69.6 %   Platelets 226  150 - 400 K/uL   Neutrophils Relative % 46  43 - 77 %   Neutro Abs 3.5  1.7 - 7.7 K/uL   Lymphocytes Relative 42  12 - 46 %   Lymphs Abs 3.2  0.7 - 4.0 K/uL   Monocytes Relative 8  3 - 12 %   Monocytes Absolute 0.6  0.1 - 1.0 K/uL   Eosinophils Relative 4  0 - 5 %   Eosinophils Absolute 0.3  0.0 - 0.7 K/uL   Basophils Relative 1  0 - 1 %   Basophils Absolute 0.0  0.0 - 0.1 K/uL  BASIC METABOLIC PANEL      Result Value Range   Sodium 139  135 - 145 mEq/L   Potassium 4.6  3.5 - 5.1 mEq/L   Chloride 109  96 - 112 mEq/L   CO2 22  19 - 32 mEq/L   Glucose, Bld 93  70 - 99 mg/dL   BUN 28 (*) 6 - 23 mg/dL   Creatinine, Ser 2.95  0.50 - 1.10 mg/dL   Calcium 9.1  8.4 - 28.4 mg/dL   GFR calc non Af Amer 82 (*) >90 mL/min   GFR calc Af Amer >90  >90 mL/min  URINALYSIS, ROUTINE W REFLEX MICROSCOPIC      Result Value Range   Color, Urine YELLOW  YELLOW   APPearance CLEAR  CLEAR   Specific Gravity, Urine 1.023  1.005 - 1.030   pH 6.5  5.0 - 8.0   Glucose, UA NEGATIVE  NEGATIVE mg/dL   Hgb urine dipstick NEGATIVE  NEGATIVE   Bilirubin Urine NEGATIVE  NEGATIVE   Ketones, ur NEGATIVE  NEGATIVE mg/dL   Protein, ur NEGATIVE  NEGATIVE mg/dL   Urobilinogen, UA 0.2  0.0 - 1.0 mg/dL   Nitrite NEGATIVE  NEGATIVE   Leukocytes, UA NEGATIVE  NEGATIVE  POCT PREGNANCY, URINE      Result Value Range   Preg Test, Ur NEGATIVE  NEGATIVE   Mr Maxine Glenn Head Wo Contrast  08/22/2013   CLINICAL DATA:  Posterior headache.  Rule out venous thrombosis.  EXAM: MRI HEAD WITHOUT CONTRAST  MRA HEAD WITHOUT CONTRAST  MRA NECK WITHOUT CONTRAST  TECHNIQUE:  Multiplanar, multiecho pulse sequences of the brain and surrounding structures were obtained without intravenous contrast. Angiographic images of the Circle of Willis were obtained using MRA technique without intravenous contrast. Angiographic images of the neck were obtained using MRA technique without intravenous contrast. Carotid stenosis measurements (when applicable) are obtained utilizing NASCET criteria, using the distal internal carotid diameter as the denominator.  COMPARISON:  MRI 12/12/2010  FINDINGS: MRI HEAD FINDINGS  Ventricle size is normal. Negative for acute or chronic infarction. Negative for intracranial hemorrhage or mass. No edema in the brain.  Small arachnoid cyst left frontal lobe over the convexity is unchanged. This measures approximately 8 x 12 mm. Chronic remodeling of bone is also unchanged.  MRA  HEAD FINDINGS  Both vertebral arteries are patent to the basilar. Posterior inferior cerebellar artery is patent bilaterally. Basilar artery is widely patent. Superior cerebellar arteries are patent bilaterally. Posterior cerebral arteries are patent bilaterally with fetal origin of the right PCA and hypoplastic right P1 segment. Left posterior communicating artery is patent.  Anterior circulation reveals widely patent internal carotid arteries bilaterally. Anterior and middle cerebral arteries are widely patent bilaterally.  Negative for cerebral aneurysm.  MRA NECK FINDINGS  Major dural sinuses are patent. The internal cerebral veins and straight sinus are patent. Superior sagittal sinus is patent. Transverse sinus and sigmoid sinus is patent bilaterally.  IMPRESSION: Normal MRI of the brain. Small arachnoid cyst over the convexity on the left is stable.  Negative MRA and MRV of the head.   Electronically Signed   By: Marlan Palau M.D.   On: 08/22/2013 14:24   Mr Brain Wo Contrast  08/22/2013   CLINICAL DATA:  Posterior headache.  Rule out venous thrombosis.  EXAM: MRI HEAD WITHOUT  CONTRAST  MRA HEAD WITHOUT CONTRAST  MRA NECK WITHOUT CONTRAST  TECHNIQUE: Multiplanar, multiecho pulse sequences of the brain and surrounding structures were obtained without intravenous contrast. Angiographic images of the Circle of Willis were obtained using MRA technique without intravenous contrast. Angiographic images of the neck were obtained using MRA technique without intravenous contrast. Carotid stenosis measurements (when applicable) are obtained utilizing NASCET criteria, using the distal internal carotid diameter as the denominator.  COMPARISON:  MRI 12/12/2010  FINDINGS: MRI HEAD FINDINGS  Ventricle size is normal. Negative for acute or chronic infarction. Negative for intracranial hemorrhage or mass. No edema in the brain.  Small arachnoid cyst left frontal lobe over the convexity is unchanged. This measures approximately 8 x 12 mm. Chronic remodeling of bone is also unchanged.  MRA HEAD FINDINGS  Both vertebral arteries are patent to the basilar. Posterior inferior cerebellar artery is patent bilaterally. Basilar artery is widely patent. Superior cerebellar arteries are patent bilaterally. Posterior cerebral arteries are patent bilaterally with fetal origin of the right PCA and hypoplastic right P1 segment. Left posterior communicating artery is patent.  Anterior circulation reveals widely patent internal carotid arteries bilaterally. Anterior and middle cerebral arteries are widely patent bilaterally.  Negative for cerebral aneurysm.  MRA NECK FINDINGS  Major dural sinuses are patent. The internal cerebral veins and straight sinus are patent. Superior sagittal sinus is patent. Transverse sinus and sigmoid sinus is patent bilaterally.  IMPRESSION: Normal MRI of the brain. Small arachnoid cyst over the convexity on the left is stable.  Negative MRA and MRV of the head.   Electronically Signed   By: Marlan Palau M.D.   On: 08/22/2013 14:24   Mr Cervical Spine Wo Contrast  08/22/2013   CLINICAL  DATA:  Neck pain and headache. Spasms after suspected seizure.  EXAM: MRI CERVICAL SPINE WITHOUT CONTRAST  TECHNIQUE: Multiplanar, multisequence MR imaging was performed. No intravenous contrast was administered.  COMPARISON:  MRI head from the same day.  FINDINGS: Normal signal is present in the cervical and upper thoracic spinal cord to the lowest imaged level, T3. Marrow signal, vertebral body heights, and alignment are normal. Flow is present in the major vascular structures of the neck. No significant disc herniation or stenosis is evident. The soft tissues are unremarkable.  IMPRESSION: Negative MRI of the cervical spine.   Electronically Signed   By: Gennette Pac M.D.   On: 08/22/2013 17:28   Mr [place]  08/22/2013  CLINICAL DATA:  Posterior headache.  Rule out venous thrombosis.  EXAM: MRI HEAD WITHOUT CONTRAST  MRA HEAD WITHOUT CONTRAST  MRA NECK WITHOUT CONTRAST  TECHNIQUE: Multiplanar, multiecho pulse sequences of the brain and surrounding structures were obtained without intravenous contrast. Angiographic images of the Circle of Willis were obtained using MRA technique without intravenous contrast. Angiographic images of the neck were obtained using MRA technique without intravenous contrast. Carotid stenosis measurements (when applicable) are obtained utilizing NASCET criteria, using the distal internal carotid diameter as the denominator.  COMPARISON:  MRI 12/12/2010  FINDINGS: MRI HEAD FINDINGS  Ventricle size is normal. Negative for acute or chronic infarction. Negative for intracranial hemorrhage or mass. No edema in the brain.  Small arachnoid cyst left frontal lobe over the convexity is unchanged. This measures approximately 8 x 12 mm. Chronic remodeling of bone is also unchanged.  MRA HEAD FINDINGS  Both vertebral arteries are patent to the basilar. Posterior inferior cerebellar artery is patent bilaterally. Basilar artery is widely patent. Superior cerebellar arteries are patent  bilaterally. Posterior cerebral arteries are patent bilaterally with fetal origin of the right PCA and hypoplastic right P1 segment. Left posterior communicating artery is patent.  Anterior circulation reveals widely patent internal carotid arteries bilaterally. Anterior and middle cerebral arteries are widely patent bilaterally.  Negative for cerebral aneurysm.  MRA NECK FINDINGS  Major dural sinuses are patent. The internal cerebral veins and straight sinus are patent. Superior sagittal sinus is patent. Transverse sinus and sigmoid sinus is patent bilaterally.  IMPRESSION: Normal MRI of the brain. Small arachnoid cyst over the convexity on the left is stable.  Negative MRA and MRV of the head.   Electronically Signed   By: Marlan Palau M.D.   On: 08/22/2013 14:24    Visual workup as per of neural hospitalist request all findings were negative are unchanged. Nothing clinically relevant requiring admission. Went back to discharge patient patient's mother is requesting reconsultation with neurology. Will place a call given no promises.  Shelda Jakes, MD 08/22/13 [id number]

## 2013-08-22 NOTE — Consult Note (Signed)
NEURO HOSPITALIST CONSULT NOTE    Reason for Consult:HA, neck pain, episodic jerkiness.  HPI:                                                                                                                                          Stacy Poole is an 22 y.o. female with a past medical history significant for anxiety, substance abuse, pseudotumor cerebri, comes in today accompanied by her mother for evaluation of the above stated symptoms. She stated that this type of headache and neck pain in very different to her HA from pseudotumor cerebri and it has been present off and on since last January and worsened in the last week. The pain is localized mainly in the back of the neck, " like a severe pressure", and then travels to her spine and arms. It is worsened by any type of activity and gets better if she remains still. No associated nausea, vomiting, vertigo, double vision, focal weakness or numbness, slurred speech, language or visual disturbances. It doesn't improve with the use of ibuprofen. At the same time, she tells me that the pain gets so intense that it will frequently trigger " a jolt of spasms and jerkiness throughout my whole body". Unable to sleep well as the result of the pain. Denies fever, infection, recent head or neck injury, but she attributes this pain of prior concussions.  Regarding her pseudotumor, she said that she got much better and is not taking he diamox anymore.  Past Medical History  Diagnosis Date  . Anxiety   . Substance abuse   . Acute ear infection   . Mononucleosis   . Fifth disease   . Mental disorder     History reviewed. No pertinent past surgical history.  History reviewed. No pertinent family history.  Social History:  reports that she has been smoking Cigarettes.  She has been smoking about 0.50 packs per day. She has never used smokeless tobacco. She reports that she drinks about 6.0 ounces of alcohol per week. She reports  that she does not use illicit drugs.  Allergies  Allergen Reactions  . Tetracyclines & Related Other (See Comments)    Ear popping, couldn't move neck/back, and had blind spots/double vision    MEDICATIONS:  I have reviewed the patient's current medications.   ROS:                                                                                                                                       History obtained from the patient, mother, and chart review.  General ROS: negative for - chills, fatigue, fever, night sweats, weight gain or weight loss Psychological ROS: negative for - behavioral disorder, hallucinations, memory difficulties Ophthalmic ROS: negative for - blurry vision, double vision, eye pain or loss of vision ENT ROS: negative for - epistaxis, nasal discharge, oral lesions, sore throat, tinnitus or vertigo Allergy and Immunology ROS: negative for - hives or itchy/watery eyes Hematological and Lymphatic ROS: negative for - bleeding problems, bruising or swollen lymph nodes Endocrine ROS: negative for - galactorrhea, hair pattern changes, polydipsia/polyuria or temperature intolerance Respiratory ROS: negative for - cough, hemoptysis, shortness of breath or wheezing Cardiovascular ROS: negative for - chest pain, dyspnea on exertion, edema or irregular heartbeat Gastrointestinal ROS: negative for - abdominal pain, diarrhea, hematemesis, nausea/vomiting or stool incontinence Genito-Urinary ROS: negative for - dysuria, hematuria, incontinence or urinary frequency/urgency Musculoskeletal ROS: negative for - joint swelling or muscular weakness Neurological ROS: as noted in HPI Dermatological ROS: negative for rash and skin lesion changes   Physical exam: pleasant female in no apparent distress.Blood pressure 141/90, temperature 98.5 F (36.9 C), temperature source  Oral, resp. rate 18, weight 58.968 kg (130 lb), last menstrual period 08/02/2013, SpO2 100.00%. Head: normocephalic. Neck: diminished range of motion, no bruits, no JVD. Cardiac: no murmurs. Lungs: clear. Abdomen: soft, no tender, no mass. Extremities: no edema.  Neurologic Examination:                                                                                                      Mental Status: Alert, oriented, thought content appropriate.  Speech fluent without evidence of aphasia.  Able to follow 3 step commands without difficulty. Cranial Nerves: II: Discs flat bilaterally; Visual fields grossly normal, pupils equal, round, reactive to light and accommodation III,IV, VI: ptosis not present, extra-ocular motions intact bilaterally V,VII: smile symmetric, facial light touch sensation normal bilaterally VIII: hearing normal bilaterally IX,X: gag reflex present XI: bilateral shoulder shrug XII: midline tongue extension without atrophy or fasciculations  Motor: Right : Upper extremity   5/5    Left:     Upper extremity   5/5  Lower extremity   5/5     Lower extremity  5/5 Tone and bulk:normal tone throughout; no atrophy noted Sensory: Pinprick and light touch intact throughout, bilaterally Deep Tendon Reflexes:  Right: Upper Extremity   Left: Upper extremity   biceps (C-5 to C-6) 2/4   biceps (C-5 to C-6) 2/4 tricep (C7) 2/4    triceps (C7) 2/4 Brachioradialis (C6) 2/4  Brachioradialis (C6) 2/4  Lower Extremity Lower Extremity  quadriceps (L-2 to L-4) 2/4   quadriceps (L-2 to L-4) 2/4 Achilles (S1) 2/4   Achilles (S1) 2/4  Plantars: Right: downgoing   Left: downgoing Cerebellar: normal finger-to-nose,  normal heel-to-shin test Gait: No tested. CV: pulses palpable throughout    No results found for this basename: cbc, bmp, coags, chol, tri, ldl, hga1c    Results for orders placed during the hospital encounter of 08/22/13 (from the past 48 hour(s))  CBC WITH  DIFFERENTIAL     Status: None   Collection Time    08/22/13  5:29 AM      Result Value Range   WBC 7.6  4.0 - 10.5 K/uL   RBC 4.70  3.87 - 5.11 MIL/uL   Hemoglobin 14.0  12.0 - 15.0 g/dL   HCT 16.1  09.6 - 04.5 %   MCV 87.0  78.0 - 100.0 fL   MCH 29.8  26.0 - 34.0 pg   MCHC 34.2  30.0 - 36.0 g/dL   RDW 40.9  81.1 - 91.4 %   Platelets 226  150 - 400 K/uL   Neutrophils Relative % 46  43 - 77 %   Neutro Abs 3.5  1.7 - 7.7 K/uL   Lymphocytes Relative 42  12 - 46 %   Lymphs Abs 3.2  0.7 - 4.0 K/uL   Monocytes Relative 8  3 - 12 %   Monocytes Absolute 0.6  0.1 - 1.0 K/uL   Eosinophils Relative 4  0 - 5 %   Eosinophils Absolute 0.3  0.0 - 0.7 K/uL   Basophils Relative 1  0 - 1 %   Basophils Absolute 0.0  0.0 - 0.1 K/uL  BASIC METABOLIC PANEL     Status: Abnormal   Collection Time    08/22/13  5:29 AM      Result Value Range   Sodium 139  135 - 145 mEq/L   Potassium 4.6  3.5 - 5.1 mEq/L   Chloride 109  96 - 112 mEq/L   CO2 22  19 - 32 mEq/L   Glucose, Bld 93  70 - 99 mg/dL   BUN 28 (*) 6 - 23 mg/dL   Creatinine, Ser 7.82  0.50 - 1.10 mg/dL   Calcium 9.1  8.4 - 95.6 mg/dL   GFR calc non Af Amer 82 (*) >90 mL/min   GFR calc Af Amer >90  >90 mL/min   Comment: (NOTE)     The eGFR has been calculated using the CKD EPI equation.     This calculation has not been validated in all clinical situations.     eGFR's persistently <90 mL/min signify possible Chronic Kidney     Disease.    No results found.  Assessment/Plan: 22 y/o with history of pseudotumor cerebri, comes with complaining of worsening neck pain traveling to the spine and arms, worsened by movements, and associated with " body jerkiness". No visual complains and neuro-exam reveals no papilledema. There is neck and spine tenderness with diminished range of motion neck. I believe patient' s pain is probable unrelated to her pseudotumor cerebri. Will add MRI cervical spine  to previously order MRI/MRA/,MRV brain. She will  need further evaluation as outpatient if her neuro-imaging is unremarkable.   Wyatt Portela, MD 08/22/2013, 6:25 AM [facility]

## 2013-08-22 NOTE — ED Notes (Signed)
MRI called patient decided to stay to have test.

## 2013-08-22 NOTE — ED Notes (Signed)
Patient states did not finish MRI of the neck because of muscle spasms.  States feels good denies pain 0/10. Called MRI who stated patient refused to continued MRI.  Notified Doctor.  Patient agreed to finish MRI if given additional ativan.  Doctor notified.  MRI notified will call when ready.

## 2013-08-22 NOTE — ED Notes (Signed)
Doctor notified patient back from MRI with mother at bedside. EDP ordered patient able to eat.  Given [place] sandwich, apple sauce, and sprite.

## 2013-09-08 ENCOUNTER — Emergency Department
Admission: EM | Admit: 2013-09-08 | Discharge: 2013-09-08 | Disposition: A | Discharge status: Home / Self Care | Payer: No Typology Code available for payment source | Attending: Emergency Medicine | Admitting: Emergency Medicine

## 2013-09-08 ENCOUNTER — Emergency Department: Payer: No Typology Code available for payment source

## 2013-09-08 DIAGNOSIS — F17200 Nicotine dependence, unspecified, uncomplicated: Secondary | ICD-10-CM | POA: Insufficient documentation

## 2013-09-08 DIAGNOSIS — Z883 Allergy status to other anti-infective agents status: Secondary | ICD-10-CM | POA: Insufficient documentation

## 2013-09-08 DIAGNOSIS — B349 Viral infection, unspecified: Secondary | ICD-10-CM

## 2013-09-08 DIAGNOSIS — B9789 Other viral agents as the cause of diseases classified elsewhere: Secondary | ICD-10-CM | POA: Insufficient documentation

## 2013-09-08 LAB — GROUP A STREP, RAPID ANTIGEN: Group A Strep, Rapid Antigen: NEGATIVE

## 2013-09-08 MED ORDER — OSELTAMIVIR PHOSPHATE 75 MG PO CAPS
75.0000 mg | ORAL_CAPSULE | Freq: Two times a day (BID) | ORAL | Status: AC
Start: 2013-09-08 — End: 2013-09-13

## 2013-09-08 MED ORDER — ALBUTEROL SULFATE HFA 108 (90 BASE) MCG/ACT IN AERS
2.0000 | INHALATION_SPRAY | RESPIRATORY_TRACT | Status: DC | PRN
Start: 2013-09-08 — End: 2015-03-06

## 2013-09-08 MED ORDER — GUAIFENESIN-CODEINE 100-10 MG/5ML PO SYRP
5.0000 mL | ORAL_SOLUTION | Freq: Three times a day (TID) | ORAL | Status: DC | PRN
Start: 2013-09-08 — End: 2015-03-06

## 2013-09-08 NOTE — ED Provider Notes (Signed)
Physician/Midlevel provider first contact with patient: 09/08/13 1929         EMERGENCY DEPARTMENT HISTORY AND PHYSICAL EXAM    Date Time: 09/08/2013 7:45 PM  Patient Name: Melody Frazier  Attending MD Etheleen Mayhew               History of Presenting Illness     Chief Complaint:   Chief Complaint   Patient presents with   . Flu like symptoms       Mkayla Steele is a 23 y.o. female who presents with flu-like symptoms over the past two days. Pt reports productive cough with yellow mucus, "hot eyes", HA, chest pressure, congestion, sore throat, intermittent subjective fever and chills. Pt notes friends with flu like illnesses. She has been taking Sudafed with no relief.     LMP: ended two weeks ago.      This history was obtained from the(a) patient.    Past Medical History     Past Medical History   Diagnosis Date   . Mono exposure        Past Surgical History     Past Surgical History   Procedure Date   . Wisdom tooth extraction        Family History     No family history on file.    Social History     History     Social History   . Marital Status: Single     Spouse Name: N/A     Number of Children: N/A   . Years of Education: N/A     Social History Main Topics   . Smoking status: Current Some Day Smoker   . Smokeless tobacco: Not on file   . Alcohol Use: Yes   . Drug Use: No   . Sexually Active: Not on file     Other Topics Concern   . Not on file     Social History Narrative   . No narrative on file       Allergies     Allergies   Allergen Reactions   . Tetracyclines & Related        Medications     No current facility-administered medications for this encounter.  Current outpatient prescriptions:traZODone (DESYREL) 50 MG tablet, Take 50 mg by mouth nightly., Disp: , Rfl: ;  albuterol (PROAIR HFA) 108 (90 BASE) MCG/ACT inhaler, Inhale 2 puffs into the lungs every 4 (four) hours as needed for Wheezing., Disp: 1 Inhaler, Rfl: 0;  guaiFENesin-codeine (ROBITUSSIN AC) 100-10 MG/5ML syrup, Take 5  mLs by mouth 3 (three) times daily as needed for Cough., Disp: 120 mL, Rfl: 0    Review of Systems     Positive: congestion, fever, chills, sore throat, chest tightness, cough, fever, HA.    All Other Systems Reviewed and Negative: Yes    Physical Exam     Constitutional: Vital signs reviewed. Well appearing.  Head: Normocephalic, atraumatic  Eyes: . No discharge.no icterus  ENT: Mucous membranes moist op clear, efusions rt greater thanleft  ear  Neck: Normal range of motion. No jvd  Respiratory/Chest: Clear to auscultation. No respiratory distress.   Cardiovascular: Regular rate and rhythm. No murmur.   Abdomen: Soft and non-tender. No rebound/guarding  Back  UpperExtremity:no edema, cyanosis, FROM  LowerExtremity: No edema. No cyanosis. FROM  Neurological: No focal motor deficits by observation. Speech normal. Memory normal.  Skin: Warm and dry. No cerv lan  Lymphatic:  Psychiatric: Normal affect. Normal concentration.  Diagnostic Study Results     Labs -       Labs Reviewed   RAPID INFLUENZA A/B ANTIGENS    Narrative:     ORDER#: [id number]                                    ORDERED BY: Elona Yinger  SOURCE: Nasal Aspirate                               COLLECTED:  09/08/13 19:15  ANTIBIOTICS AT COLL.:                                RECEIVED :  09/08/13 19:26  Influenza, Rapid Antigen A & B             FINAL       09/08/13 19:40  09/08/13   Negative for Influenza A and B             Reference Range: Negative     GROUP A STREP, RAPID ANTIGEN   THROAT CULTURE       Radiologic Studies -          Clinical Course in the Emergency Department/Medical Decision Making     I reviewed the vital signs, nursing notes, past medical history, past surgical history, family history and social history.    Vital Signs - BP 128/82  Pulse 79  Temp 98 F (36.7 C)  Resp 20  Ht 1.626 m  Wt 56.7 kg  BMI 21.45 kg/m2  SpO2 100%  LMP 08/30/2013  Vital signs   Patient Vitals for the past 12 hrs:   BP Temp Pulse Resp   09/08/13  1909 128/82 mmHg 98 F (36.7 C) 79  20        Pulse Oximetry Analysis - nl without need for supplemental oxygen      Labs:I have reviewed the labs at the time of visit. Dr Valere Dross      Differential Diagnosis (not completely inclusive): viral bacterial    The patient's presentation is suggestive of a viral syndrome. The patient is clinically well appearing. There is no indication of bacterial infection or other serious etiology. The patient appears appropriate for outpatient management with symptomatic treatment and PCP F/U. Warned to return immediately for worsening symptoms or any concerns.              Final diagnoses:   None     New Prescriptions    ALBUTEROL (PROAIR HFA) 108 (90 BASE) MCG/ACT INHALER    Inhale 2 puffs into the lungs every 4 (four) hours as needed for Wheezing.    GUAIFENESIN-CODEINE (ROBITUSSIN AC) 100-10 MG/5ML SYRUP    Take 5 mLs by mouth 3 (three) times daily as needed for Cough.     ED Disposition     Discharge Melody Frazier discharge to home/self care.    Condition at disposition: Stable            _______________________________    Attestations:  I was acting as a scribe for Carmon Sails, MD on Melody Frazier  Treatment Team: Scribe: Gemma Payor   I am the first provider for this patient and I personally performed the services documented. Treatment Team: Scribe: Gemma Payor is scribing for me on [place],Sherrelle  WINSTON. This note accurately reflects work and decisions made by me.  Carmon Sails, MD    _______________________________              Carmon Sails, MD  09/08/13 [phone]

## 2013-09-08 NOTE — Discharge Instructions (Signed)
Viral Syndrome    You have been diagnosed with a viral syndrome, also known as a "cold."    Symptoms of a viral syndrome can include fever, headache, stuffy nose, nasal congestion, sore throat, cough, muscle aches, and sometimes a rash. Some people may also have nausea, vomiting and/or diarrhea.    Unfortunately, there is still no cure for the common cold! Antibiotics DO NOT work for viral illnesses and may cause side effects like rash or diarrhea. Additionally, antibiotics may not work in future bacterial infections if used inappropriately for viral infections. The treatment of viral syndromes is with rest, plenty of fluids, acetaminophen (Tylenol) and/or ibuprofen (Advil or Motrin) and "a little time."    NEVER take ASPIRIN products with a viral infection. Aspirin may cause liver failure with certain viral infections.    Routine follow-up with your primary care doctor is recommended.    YOU SHOULD SEEK MEDICAL ATTENTION IMMEDIATELY, EITHER HERE OR AT THE NEAREST EMERGENCY DEPARTMENT, IF ANY OF THE FOLLOWING OCCURS:   Severe headache or stiff neck.   Uncontrolled vomiting.   Lightheadedness, feeling faint, or if you pass out.   Problems breathing or shortness of breath.   Weakness or inability to walk.   Any other concerning symptoms or concerns.   If not improving over 7 days or if feeling worse at any time.      Salt water nasal spray    Afrin or neo synephrine for 3 days    Oral decongestants(pseudophedrine)    Ibuprofen, Over The Counter    For pain and inflammation, take Ibuprofen (Motrin or Advil) 200mg  tabs. You can take three tablets every 8 hours as needed for pain and/or inflammation.          Marland Kitchen

## 2013-09-08 NOTE — ED Notes (Signed)
Family at bedside. 

## 2013-09-08 NOTE — ED Notes (Signed)
Pt c/o flu like symptoms since the 3rd with congestion, fever, chills, sore throat

## 2013-09-11 NOTE — ED Provider Notes (Signed)
CSN: [id number]     Arrival date & time 08/22/13  0441 History   First MD Initiated Contact with Patient 08/22/13 0455     Chief Complaint  Patient presents with  . Seizures  . Anxiety  . Insomnia  . Eye Problem  . Generalized Body Aches   (Consider location/radiation/quality/duration/timing/severity/associated sxs/prior Treatment) HPI Comments: 23 y.o. female with a past medical history significant for anxiety, substance abuse, pseudotumor cerebri, comes in today accompanied by her mother for headaches. She stated that this type of headache and neck pain in very different to her HA from pseudotumor cerebri and it has been present off and on since last January and worsened in the last week. The pain is localized mainly in the back of the neck, " like a severe pressure", and then travels to her spine and arms. It is worsened by any type of activity and gets better if she remains still. No associated nausea, vomiting, vertigo, double vision, focal weakness or numbness, slurred speech, language or visual disturbances. It doesn't improve with the use of ibuprofen. Denies fever, infection, recent head or neck injury, but she attributes this pain of prior concussions.   Regarding her pseudotumor, she said that she got much better and is not taking he diamox anymore.      Patient is a 23 y.o. female presenting with seizures, anxiety, and eye problem. The history is provided by the patient.  Seizures Anxiety Associated symptoms include headaches. Pertinent negatives include no chest pain, no abdominal pain and no shortness of breath.  Eye Problem Associated symptoms: headaches   Associated symptoms: no nausea and no vomiting     Past Medical History  Diagnosis Date  . Anxiety   . Substance abuse   . Acute ear infection   . Mononucleosis   . Fifth disease   . Mental disorder    History reviewed. No pertinent past surgical history. History reviewed. No pertinent family history. History   Substance Use Topics  . Smoking status: Current Some Day Smoker -- 0.50 packs/day    Types: Cigarettes  . Smokeless tobacco: Never Used  . Alcohol Use: 6.0 oz/week    10 Shots of liquor per week     Comment: daily   OB History   Grav Para Term Preterm Abortions TAB SAB Ect Mult Living                 Review of Systems  Constitutional: Positive for activity change.  Respiratory: Negative for shortness of breath.   Cardiovascular: Negative for chest pain.  Gastrointestinal: Negative for nausea, vomiting and abdominal pain.  Genitourinary: Negative for dysuria.  Musculoskeletal: Negative for neck pain.  Neurological: Positive for headaches. Negative for seizures.    Allergies  Tetracyclines & related  Home Medications   Current Outpatient Rx  Name  Route  Sig  Dispense  Refill  . acetaZOLAMIDE (DIAMOX) 125 MG tablet   Oral   Take 500 mg by mouth 2 (two) times daily. Unknown dosage, mother will verify.         . cholecalciferol (VITAMIN D) 1000 UNITS tablet   Oral   Take 1,000 Units by mouth daily.         . fish oil-omega-3 fatty acids 1000 MG capsule   Oral   Take 1 g by mouth 3 (three) times daily.         Marland Kitchen. ibuprofen (ADVIL,MOTRIN) 200 MG tablet   Oral   Take 200 mg by mouth every 6 (  six) hours as needed for mild pain.         . vitamin E (VITAMIN E) 400 UNIT capsule   Oral   Take 400 Units by mouth daily.         . cyclobenzaprine (FLEXERIL) 10 MG tablet   Oral   Take 1 tablet (10 mg total) by mouth 2 (two) times daily as needed for muscle spasms.   20 tablet   0   . LORazepam (ATIVAN) 1 MG tablet   Oral   Take 1 tablet (1 mg total) by mouth 3 (three) times daily as needed for anxiety.   6 tablet   0   . naproxen (NAPROSYN) 500 MG tablet   Oral   Take 1 tablet (500 mg total) by mouth 2 (two) times daily.   14 tablet   0   . traMADol (ULTRAM) 50 MG tablet   Oral   Take 1 tablet (50 mg total) by mouth every 6 (six) hours as needed.    20 tablet   0    BP 151/58  Pulse 71  Temp(Src) 98.5 F (36.9 C) (Oral)  Resp 16  Wt 130 lb (58.968 kg)  SpO2 99%  LMP 08/02/2013 Physical Exam  Nursing note and vitals reviewed. Constitutional: She is oriented to person, place, and time. She appears well-developed and well-nourished.  HENT:  Head: Normocephalic and atraumatic.  Eyes: EOM are normal. Pupils are equal, round, and reactive to light.  Neck: Neck supple.  Cardiovascular: Normal rate, regular rhythm and normal heart sounds.   No murmur heard. Pulmonary/Chest: Effort normal. No respiratory distress.  Abdominal: Soft. She exhibits no distension. There is no tenderness. There is no rebound and no guarding.  Neurological: She is alert and oriented to person, place, and time. No cranial nerve deficit. Coordination normal.  Skin: Skin is warm and dry.    ED Course  Procedures (including critical care time) Labs Review Labs Reviewed  BASIC METABOLIC PANEL - Abnormal; Notable for the following:    BUN 28 (*)    GFR calc non Af Amer 82 (*)    All other components within normal limits  CBC WITH DIFFERENTIAL  URINALYSIS, ROUTINE W REFLEX MICROSCOPIC  POCT PREGNANCY, URINE   Imaging Review No results found.  EKG Interpretation    Date/Time:  Friday August 22 2013 04:53:27 EST Ventricular Rate:  104 PR Interval:  138 QRS Duration: 87 QT Interval:  336 QTC Calculation: 442 R Axis:   36 Text Interpretation:  Sinus tachycardia Right atrial enlargement ED PHYSICIAN INTERPRETATION AVAILABLE IN [facility] Confirmed by TEST, RECORD ([id number]) on 08/24/2013 11:21:28 AM            MDM   1. Anxiety    Pt with complex headaches. Hx of Pseudotumor cerebri. Headaches are atypical - and seems like her pseudotumor improved with just 1 LP. No concerns for infections. No hx of aneurysms, no trauma.  Neuro consulted-  And they wanted MRI. Pt signed out at that point to the incoming staff.  Derwood Kaplan,  MD 09/11/13 1724

## 2014-01-01 ENCOUNTER — Ambulatory Visit (INDEPENDENT_AMBULATORY_CARE_PROVIDER_SITE_OTHER): Payer: 59 | Admitting: Physician Assistant

## 2014-01-01 VITALS — BP 114/68 | HR 59 | Temp 98.1°F | Resp 14 | Ht 63.0 in | Wt 128.3 lb

## 2014-01-01 DIAGNOSIS — Z111 Encounter for screening for respiratory tuberculosis: Secondary | ICD-10-CM

## 2014-01-01 NOTE — Progress Notes (Signed)
   23 year old female here for PPD. 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 No prior positive. Asymptomatic. Will place PPD and patient will return to clinic in 48-72 hours for reading.    Eula Listenyan Kamdin Follett, MHS, PA-C [facility] [address] [place], [place] [id number] [phone] [facility] 01/01/2014 3:23 PM

## 2014-01-01 NOTE — Progress Notes (Signed)
  Tuberculosis Risk Questionnaire  1. No Were you born outside the [place] in one of the following parts of the world: [place], [place], [place], [place] or [place]?    2. No Have you traveled outside the [place] and lived for more than one month in one of the following parts of the world: [place], [place], [place], [place] or [place]?    3. No Do you have a compromised immune system such as from any of the following conditions:HIV/AIDS, organ or bone marrow transplantation, diabetes, immunosuppressive medicines (e.g. Prednisone, Remicaide), leukemia, lymphoma, cancer of the head or neck, gastrectomy or jejunal bypass, end-stage renal disease (on dialysis), or silicosis?     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    5. No Have you ever: injected illegal drugs, used crack cocaine, lived in a homeless shelter  or been in jail or prison?     6. No Have you ever been exposed to anyone with infectious tuberculosis?    Tuberculosis Symptom Questionnaire  Do you currently have any of the following symptoms?  1. No Unexplained cough lasting more than 3 weeks?   2. No Unexplained fever lasting more than 3 weeks.   3. No Night Sweats (sweating that leaves the bedclothes and sheets wet)     4. No Shortness of Breath   5. No Chest Pain   6. No Unintentional weight loss    7. No Unexplained fatigue (very tired for no reason)

## 2014-01-04 ENCOUNTER — Ambulatory Visit (INDEPENDENT_AMBULATORY_CARE_PROVIDER_SITE_OTHER): Payer: 59 | Admitting: Family Medicine

## 2014-01-04 DIAGNOSIS — Z111 Encounter for screening for respiratory tuberculosis: Secondary | ICD-10-CM

## 2014-01-04 LAB — TB SKIN TEST
INDURATION: 0 mm
TB Skin Test: NEGATIVE

## 2014-02-28 ENCOUNTER — Encounter (HOSPITAL_COMMUNITY): Payer: Self-pay | Admitting: Emergency Medicine

## 2014-02-28 ENCOUNTER — Emergency Department (INDEPENDENT_AMBULATORY_CARE_PROVIDER_SITE_OTHER)
Admission: EM | Admit: 2014-02-28 | Discharge: 2014-02-28 | Disposition: A | Discharge status: Home / Self Care | Payer: 59 | Source: Home / Self Care

## 2014-02-28 DIAGNOSIS — G4700 Insomnia, unspecified: Secondary | ICD-10-CM

## 2014-02-28 HISTORY — DX: Cerebral cysts: G93.0

## 2014-02-28 MED ORDER — TRAZODONE HCL 150 MG PO TABS: 150.0000 mg | ORAL_TABLET | Freq: Every day | ORAL | Status: DC

## 2014-02-28 NOTE — ED Provider Notes (Signed)
CSN: [id number]     Arrival date & time 02/28/14  1620 History   First MD Initiated Contact with Patient 02/28/14 1701     Chief Complaint  Patient presents with  . Insomnia   (Consider location/radiation/quality/duration/timing/severity/associated sxs/prior Treatment) HPI Comments: 23 year old female diagnosed with chronic insomnia in January 2015. She was asked to followup with PCP however she has failed to do that. In the last couple weeks she said 0-4 hours of sleep per night. She attributes this to stress of her jaw. States her other medicines are natural to help her sleep but not helpful.   Past Medical History  Diagnosis Date  . Anxiety   . Substance abuse   . Acute ear infection   . Mononucleosis   . Fifth disease   . Mental disorder   . Arachnoid cyst    History reviewed. No pertinent past surgical history. No family history on file. History  Substance Use Topics  . Smoking status: Former Smoker -- 0.50 packs/day    Types: Cigarettes  . Smokeless tobacco: Never Used  . Alcohol Use: No     Comment: daily   OB History   Grav Para Term Preterm Abortions TAB SAB Ect Mult Living                 Review of Systems  Constitutional: Positive for activity change. Negative for fever.  HENT: Negative.   Respiratory: Negative.   Gastrointestinal: Negative.   Skin: Negative.   Neurological: Negative for dizziness, seizures, syncope, speech difficulty and numbness.  Psychiatric/Behavioral: Positive for sleep disturbance. Negative for self-injury. The patient is not hyperactive.     Allergies  Tetracyclines & related  Home Medications   Prior to Admission medications   Medication Sig Start Date End Date Taking? Authorizing Provider  acetaZOLAMIDE (DIAMOX) 125 MG tablet Take 500 mg by mouth 2 (two) times daily. Unknown dosage, mother will verify.    Historical Provider, MD  cholecalciferol (VITAMIN D) 1000 UNITS tablet Take 1,000 Units by mouth daily.    Historical  Provider, MD  cyclobenzaprine (FLEXERIL) 10 MG tablet Take 1 tablet (10 mg total) by mouth 2 (two) times daily as needed for muscle spasms. 08/22/13   Vanetta MuldersScott Zackowski, MD  fish oil-omega-3 fatty acids 1000 MG capsule Take 1 g by mouth 3 (three) times daily.    Historical Provider, MD  ibuprofen (ADVIL,MOTRIN) 200 MG tablet Take 200 mg by mouth every 6 (six) hours as needed for mild pain.    Historical Provider, MD  LORazepam (ATIVAN) 1 MG tablet Take 1 tablet (1 mg total) by mouth 3 (three) times daily as needed for anxiety. 08/22/13   Vanetta MuldersScott Zackowski, MD  naproxen (NAPROSYN) 500 MG tablet Take 1 tablet (500 mg total) by mouth 2 (two) times daily. 08/22/13   Vanetta MuldersScott Zackowski, MD  traMADol (ULTRAM) 50 MG tablet Take 1 tablet (50 mg total) by mouth every 6 (six) hours as needed. 08/22/13   Vanetta MuldersScott Zackowski, MD  traZODone (DESYREL) 150 MG tablet Take 1 tablet (150 mg total) by mouth at bedtime. 02/28/14   Hayden Rasmussenavid Mabe, NP  vitamin E (VITAMIN E) 400 UNIT capsule Take 400 Units by mouth daily.    Historical Provider, MD   BP 135/88  Pulse 75  Temp(Src) 98.6 F (37 C) (Oral)  Resp 16  SpO2 98%  LMP 02/07/2014 Physical Exam  Nursing note and vitals reviewed. Constitutional: She is oriented to person, place, and time. She appears well-developed and well-nourished. No distress.  Eyes: Conjunctivae and EOM are normal.  Neck: Normal range of motion. Neck supple.  Pulmonary/Chest: Effort normal. No respiratory distress.  Neurological: She is alert and oriented to person, place, and time.  Psychiatric: Her speech is delayed. She is slowed. She is not agitated, not aggressive, not hyperactive, not actively hallucinating and not combative. She exhibits a depressed mood. She is attentive.    ED Course  Procedures (including critical care time) Labs Review Labs Reviewed - No data to display  Imaging Review No results found.   MDM   1. Insomnia    trazadone 150 mg q hs prn  #3.  Has taken this  before See PCP or [facility]     Hayden Rasmussenavid Mabe, NP 02/28/14 1744

## 2014-02-28 NOTE — ED Notes (Signed)
Reports having insomnia.  Reports minimal sleep over the past 2 weeks, sleeping 0-4 hours per day.

## 2014-02-28 NOTE — Discharge Instructions (Signed)
Insomnia Insomnia is frequent trouble falling and/or staying asleep. Insomnia can be a long term problem or a short term problem. Both are common. Insomnia can be a short term problem when the wakefulness is related to a certain stress or worry. Long term insomnia is often related to ongoing stress during waking hours and/or poor sleeping habits. Overtime, sleep deprivation itself can make the problem worse. Every little thing feels more severe because you are overtired and your ability to cope is decreased. CAUSES   Stress, anxiety, and depression.  Poor sleeping habits.  Distractions such as TV in the bedroom.  Naps close to bedtime.  Engaging in emotionally charged conversations before bed.  Technical reading before sleep.  Alcohol and other sedatives. They may make the problem worse. They can hurt normal sleep patterns and normal dream activity.  Stimulants such as caffeine for several hours prior to bedtime.  Pain syndromes and shortness of breath can cause insomnia.  Exercise late at night.  Changing time zones may cause sleeping problems (jet lag). It is sometimes helpful to have someone observe your sleeping patterns. They should look for periods of not breathing during the night (sleep apnea). They should also look to see how long those periods last. If you live alone or observers are uncertain, you can also be observed at a sleep clinic where your sleep patterns will be professionally monitored. Sleep apnea requires a checkup and treatment. Give your caregivers your medical history. Give your caregivers observations your family has made about your sleep.  SYMPTOMS   Not feeling rested in the morning.  Anxiety and restlessness at bedtime.  Difficulty falling and staying asleep. TREATMENT   Your caregiver may prescribe treatment for an underlying medical disorders. Your caregiver can give advice or help if you are using alcohol or other drugs for self-medication. Treatment  of underlying problems will usually eliminate insomnia problems.  Medications can be prescribed for short time use. They are generally not recommended for lengthy use.  Over-the-counter sleep medicines are not recommended for lengthy use. They can be habit forming.  You can promote easier sleeping by making lifestyle changes such as:  Using relaxation techniques that help with breathing and reduce muscle tension.  Exercising earlier in the day.  Changing your diet and the time of your last meal. No night time snacks.  Establish a regular time to go to bed.  Counseling can help with stressful problems and worry.  Soothing music and white noise may be helpful if there are background noises you cannot remove.  Stop tedious detailed work at least one hour before bedtime. HOME CARE INSTRUCTIONS   Keep a diary. Inform your caregiver about your progress. This includes any medication side effects. See your caregiver regularly. Take note of:  Times when you are asleep.  Times when you are awake during the night.  The quality of your sleep.  How you feel the next day. This information will help your caregiver care for you.  Get out of bed if you are still awake after 15 minutes. Read or do some quiet activity. Keep the lights down. Wait until you feel sleepy and go back to bed.  Keep regular sleeping and waking hours. Avoid naps.  Exercise regularly.  Avoid distractions at bedtime. Distractions include watching television or engaging in any intense or detailed activity like attempting to balance the household checkbook.  Develop a bedtime ritual. Keep a familiar routine of bathing, brushing your teeth, climbing into bed at the same   time each night, listening to soothing music. Routines increase the success of falling to sleep faster.  Use relaxation techniques. This can be using breathing and muscle tension release routines. It can also include visualizing peaceful scenes. You can  also help control troubling or intruding thoughts by keeping your mind occupied with boring or repetitive thoughts like the old concept of counting sheep. You can make it more creative like imagining planting one beautiful flower after another in your backyard garden.  During your day, work to eliminate stress. When this is not possible use some of the previous suggestions to help reduce the anxiety that accompanies stressful situations. MAKE SURE YOU:   Understand these instructions.  Will watch your condition.  Will get help right away if you are not doing well or get worse. Document Released: 08/18/2000 Document Revised: 11/13/2011 Document Reviewed: 09/18/2007 [employer] Patient Information 2015 [employer]. This information is not intended to replace advice given to you by your health care provider. Make sure you discuss any questions you have with your health care provider.  

## 2014-03-02 NOTE — ED Provider Notes (Signed)
Medical screening examination/treatment/procedure(s) were performed by non-physician practitioner and as supervising physician I was immediately available for consultation/collaboration.  Leslee Homeavid Keller, M.D.  Reuben Likesavid C Keller, MD 03/02/14 [phone]

## 2014-04-10 NOTE — Telephone Encounter (Signed)
NOTED

## 2014-08-22 ENCOUNTER — Emergency Department (HOSPITAL_COMMUNITY)
Admission: EM | Admit: 2014-08-22 | Discharge: 2014-08-22 | Disposition: A | Discharge status: Home / Self Care | Payer: 59 | Attending: Emergency Medicine | Admitting: Emergency Medicine

## 2014-08-22 ENCOUNTER — Encounter (HOSPITAL_COMMUNITY): Payer: Self-pay | Admitting: Emergency Medicine

## 2014-08-22 DIAGNOSIS — F419 Anxiety disorder, unspecified: Secondary | ICD-10-CM | POA: Insufficient documentation

## 2014-08-22 DIAGNOSIS — Z87891 Personal history of nicotine dependence: Secondary | ICD-10-CM | POA: Insufficient documentation

## 2014-08-22 DIAGNOSIS — Y92410 Unspecified street and highway as the place of occurrence of the external cause: Secondary | ICD-10-CM | POA: Diagnosis not present

## 2014-08-22 DIAGNOSIS — Z8669 Personal history of other diseases of the nervous system and sense organs: Secondary | ICD-10-CM | POA: Insufficient documentation

## 2014-08-22 DIAGNOSIS — Z8619 Personal history of other infectious and parasitic diseases: Secondary | ICD-10-CM | POA: Diagnosis not present

## 2014-08-22 DIAGNOSIS — S199XXA Unspecified injury of neck, initial encounter: Secondary | ICD-10-CM | POA: Diagnosis present

## 2014-08-22 DIAGNOSIS — Y9389 Activity, other specified: Secondary | ICD-10-CM | POA: Diagnosis not present

## 2014-08-22 DIAGNOSIS — S161XXA Strain of muscle, fascia and tendon at neck level, initial encounter: Secondary | ICD-10-CM | POA: Diagnosis not present

## 2014-08-22 DIAGNOSIS — H5789 Other specified disorders of eye and adnexa: Secondary | ICD-10-CM

## 2014-08-22 DIAGNOSIS — Z79899 Other long term (current) drug therapy: Secondary | ICD-10-CM | POA: Insufficient documentation

## 2014-08-22 DIAGNOSIS — S0591XA Unspecified injury of right eye and orbit, initial encounter: Secondary | ICD-10-CM | POA: Diagnosis not present

## 2014-08-22 DIAGNOSIS — Y998 Other external cause status: Secondary | ICD-10-CM | POA: Insufficient documentation

## 2014-08-22 MED ORDER — TETRACAINE HCL 0.5 % OP SOLN
2.0000 [drp] | Freq: Once | OPHTHALMIC | Status: AC
  Administered 2014-08-22: 2 [drp] via OPHTHALMIC
  Filled 2014-08-22: qty 2

## 2014-08-22 MED ORDER — FLUORESCEIN SODIUM 1 MG OP STRP
1.0000 | ORAL_STRIP | Freq: Once | OPHTHALMIC | Status: AC
  Administered 2014-08-22: 1 via OPHTHALMIC
  Filled 2014-08-22: qty 1

## 2014-08-22 MED ORDER — METHOCARBAMOL 500 MG PO TABS: 500.0000 mg | ORAL_TABLET | Freq: Two times a day (BID) | ORAL | Status: DC

## 2014-08-22 MED ORDER — IBUPROFEN 800 MG PO TABS: 800.0000 mg | ORAL_TABLET | Freq: Three times a day (TID) | ORAL | Status: DC

## 2014-08-22 MED ORDER — NAPHAZOLINE-PHENIRAMINE 0.025-0.3 % OP SOLN: 1.0000 [drp] | OPHTHALMIC | Status: DC | PRN

## 2014-08-22 NOTE — Discharge Instructions (Signed)
1. Medications: robaxin and ibuprofen for neck pain, naphalozine for eye irritation, usual home medications 2. Treatment: rest, drink plenty of fluids, gentle stretching as discussed, alternate ice and heat 3. Follow Up: Please followup with your primary doctor and or opthalmology in 2 days for discussion of your diagnoses and further evaluation after today's visit; if you do not have a primary care doctor use the resource guide provided to find one;  Return to the ER for worsening neck pain, difficulty walking, loss of bowel or bladder control, changes in vision, or other concerning symptoms,    Cervical Sprain A cervical sprain is an injury in the neck in which the strong, fibrous tissues (ligaments) that connect your neck bones stretch or tear. Cervical sprains can range from mild to severe. Severe cervical sprains can cause the neck vertebrae to be unstable. This can lead to damage of the spinal cord and can result in serious nervous system problems. The amount of time it takes for a cervical sprain to get better depends on the cause and extent of the injury. Most cervical sprains heal in 1 to 3 weeks. CAUSES  Severe cervical sprains may be caused by:   Contact sport injuries (such as from football, rugby, wrestling, hockey, auto racing, gymnastics, diving, martial arts, or boxing).   Motor vehicle collisions.   Whiplash injuries. This is an injury from a sudden forward and backward whipping movement of the head and neck.  Falls.  Mild cervical sprains may be caused by:   Being in an awkward position, such as while cradling a telephone between your ear and shoulder.   Sitting in a chair that does not offer proper support.   Working at a poorly Marketing executive station.   Looking up or down for long periods of time.  SYMPTOMS   Pain, soreness, stiffness, or a burning sensation in the front, back, or sides of the neck. This discomfort may develop immediately after the injury  or slowly, 24 hours or more after the injury.   Pain or tenderness directly in the middle of the back of the neck.   Shoulder or upper back pain.   Limited ability to move the neck.   Headache.   Dizziness.   Weakness, numbness, or tingling in the hands or arms.   Muscle spasms.   Difficulty swallowing or chewing.   Tenderness and swelling of the neck.  DIAGNOSIS  Most of the time your health care provider can diagnose a cervical sprain by taking your history and doing a physical exam. Your health care provider will ask about previous neck injuries and any known neck problems, such as arthritis in the neck. X-rays may be taken to find out if there are any other problems, such as with the bones of the neck. Other tests, such as a CT scan or MRI, may also be needed.  TREATMENT  Treatment depends on the severity of the cervical sprain. Mild sprains can be treated with rest, keeping the neck in place (immobilization), and pain medicines. Severe cervical sprains are immediately immobilized. Further treatment is done to help with pain, muscle spasms, and other symptoms and may include:  Medicines, such as pain relievers, numbing medicines, or muscle relaxants.   Physical therapy. This may involve stretching exercises, strengthening exercises, and posture training. Exercises and improved posture can help stabilize the neck, strengthen muscles, and help stop symptoms from returning.  HOME CARE INSTRUCTIONS   Put ice on the injured area.   Put ice in  a plastic bag.   Place a towel between your skin and the bag.   Leave the ice on for 15-20 minutes, 3-4 times a day.   If your injury was severe, you may have been given a cervical collar to wear. A cervical collar is a two-piece collar designed to keep your neck from moving while it heals.  Do not remove the collar unless instructed by your health care provider.  If you have long hair, keep it outside of the  collar.  Ask your health care provider before making any adjustments to your collar. Minor adjustments may be required over time to improve comfort and reduce pressure on your chin or on the back of your head.  Ifyou are allowed to remove the collar for cleaning or bathing, follow your health care provider's instructions on how to do so safely.  Keep your collar clean by wiping it with mild soap and water and drying it completely. If the collar you have been given includes removable pads, remove them every 1-2 days and hand wash them with soap and water. Allow them to air dry. They should be completely dry before you wear them in the collar.  If you are allowed to remove the collar for cleaning and bathing, wash and dry the skin of your neck. Check your skin for irritation or sores. If you see any, tell your health care provider.  Do not drive while wearing the collar.   Only take over-the-counter or prescription medicines for pain, discomfort, or fever as directed by your health care provider.   Keep all follow-up appointments as directed by your health care provider.   Keep all physical therapy appointments as directed by your health care provider.   Make any needed adjustments to your workstation to promote good posture.   Avoid positions and activities that make your symptoms worse.   Warm up and stretch before being active to help prevent problems.  SEEK MEDICAL CARE IF:   Your pain is not controlled with medicine.   You are unable to decrease your pain medicine over time as planned.   Your activity level is not improving as expected.  SEEK IMMEDIATE MEDICAL CARE IF:   You develop any bleeding.  You develop stomach upset.  You have signs of an allergic reaction to your medicine.   Your symptoms get worse.   You develop new, unexplained symptoms.   You have numbness, tingling, weakness, or paralysis in any part of your body.  MAKE SURE YOU:   Understand  these instructions.  Will watch your condition.  Will get help right away if you are not doing well or get worse. Document Released: 06/18/2007 Document Revised: 08/26/2013 Document Reviewed: 02/26/2013 [facility] Patient Information 2015 [place], [place]. This information is not intended to replace advice given to you by your health care provider. Make sure you discuss any questions you have with your health care provider.    Emergency Department Resource Guide 1) Find a Doctor and Pay Out of Pocket Although you won't have to find out who is covered by your insurance plan, it is a good idea to ask around and get recommendations. You will then need to call the office and see if the doctor you have chosen will accept you as a new patient and what types of options they offer for patients who are self-pay. Some doctors offer discounts or will set up payment plans for their patients who do not have insurance, but you will need to  ask so you aren't surprised when you get to your appointment.  2) Contact Your Local Health Department Not all health departments have doctors that can see patients for sick visits, but many do, so it is worth a call to see if yours does. If you don't know where your local health department is, you can check in your phone book. The CDC also has a tool to help you locate your state's health department, and many state websites also have listings of all of their local health departments.  3) Find a Walk-in Clinic If your illness is not likely to be very severe or complicated, you may want to try a walk in clinic. These are popping up all over the country in pharmacies, drugstores, and shopping centers. They're usually staffed by nurse practitioners or physician assistants that have been trained to treat common illnesses and complaints. They're usually fairly quick and inexpensive. However, if you have serious medical issues or chronic medical problems, these are probably not your  best option.  No Primary Care Doctor: - Call Health Connect at  [phone] - they can help you locate a primary care doctor that  accepts your insurance, provides certain services, etc. - Physician Referral Service- [phone]  Chronic Pain Problems: Organization         Address  Phone   Notes  [facility]  [phone] Patients need to be referred by their primary care doctor.   Medication Assistance: Organization         Address  Phone   Notes  [facility] Medication [facility] [address] [place] [place], [place] [address] [phone] --Must be a resident of [facility] -- Must have NO insurance coverage whatsoever (no Medicaid/ Medicare, etc.) -- The pt. MUST have a primary care doctor that directs their care regularly and follows them in the community   [facility]  [phone]   [employer]  [phone]    Agencies that provide inexpensive medical care: Organization         Address  Phone   Notes  [facility]  [phone]   [facility]    [phone]   [facility] [address] [place], [place] [address] [phone]   [facility] [address] [place], [place] [phone]   Planned Parenthood    [phone]   [facility]    [phone]   [facility]  [address], [place] Phone:  [phone], Fax:  [phone] Hours of Operation:  9 am - 6 pm, M-F.  Also accepts Medicaid/Medicare and self-pay.  [facility]  [address], [place] Phone: [phone], Fax: [phone]. Hours of Operation:  8:30 am - 5:30 pm, M-F.  Also accepts Medicaid and self-pay.  [facility] [place] [address] [place] Phone: [phone]   [facility] [address] Natasha Bence [place], [place] [phone], Ext. [phone] Mondays & Thursdays: 7-9 AM.  First 15  patients are seen on a first come, first serve basis.    Medicaid-accepting [facility] Providers:  Organization         Address  Phone   Notes  [facility] [address], [place] [phone] Also accepts self-pay patients.  [facility] [address] [place], [address]  [phone])  [phone]   [facility] [address], [place] [phone]) [phone]   [facility] [address] [place] [address], [place] [phone]) [phone]   Renaye RakersVeita Bland [address], [place]   [phone]) [phone] Only accepts [place] Access [place] patients after they have their name applied to their card.   Self-Pay (no insurance) in [facility]:  Organization         Address  Phone   Notes  Sickle Cell Patients, [facility] [address], [place] [phone]   [address] [facility] [address], [place] [phone](336) [phone]   [facility]  [address], [place] [phone]   [facility]/Dr. Osei-Bonsu  [address] [place] [address], [place] or [phone], [place] [phone] Phone number for both [place] and [place] locations is the same.  [facility] and [facility] [address], [place] [phone]) [phone]   [facility] [address] [place] [address], [place] or [address] [phone]) [phone]   [facility] [address], [place] [phone](336) [phone], phone; [phone], fax Sees patients 1st and 3rd Saturday of every month.  Must not qualify for public or private insurance (i.e. Medicaid, Medicare, [place] Health Choice, Veterans' Benefits)  Household income should be no more than 200% of the poverty level The clinic cannot treat you if you are pregnant or think you are pregnant  Sexually transmitted diseases are not treated at the clinic.    Dental  Care: Organization         Address  Phone  Notes  [facility] [address], [place] [phone]) [phone] Accepts children up to age 24 who are enrolled in [place]icaid or [place] Health Choice; pregnant women with a Medicaid card; and children who have applied for Medicaid or [place] Health Choice, but were declined, whose parents can pay a reduced fee at time of service.  [facility] of [facility] [place]  [address], [place] [phone]) [phone] Accepts children up to age 24 who are enrolled in [place] or [place] Health Choice; pregnant women with a Medicaid card; and children who have applied for Medicaid or [place] Health Choice, but were declined, whose parents can pay a reduced fee at time of service.  [facility]  [address], [place] [phone]) [phone] Patients are seen by appointment only. Walk-ins are not accepted. [facility] will see patients 24 years of age and older. Monday - Tuesday (8am-5pm) Most Wednesdays (8:30-5pm) $30 per visit, cash only  [facility]  [address], [facility] [place] [phone]) [phone] Patients are seen by appointment only. Walk-ins are not accepted. [facility] will see patients 24 years of age and older. One Wednesday Evening (Monthly: Volunteer Based).  $30 per visit, cash only  [employer] [facility] [facility]  [phone] for adults; Children under age 334, call Graduate Pediatric Dentistry at [phone]. Children aged 794-14, please call [phone] to request a pediatric application.  Dental services are provided in all areas of dental care including fillings, crowns and bridges, complete and partial dentures, implants, gum treatment, root canals, and extractions. Preventive care is also provided. Treatment is provided to both adults and children. Patients are selected via a lottery and there is often a  waiting list.   [facility] [address], [place]  [phone] [email or web address]   [facility] [address] [place], [place] [phone], Ext. [phone] Second and Fourth Thursday of each month, opens at 6:30 AM; Clinic ends at 9 AM.  Patients are seen on a first-come first-served basis, and a limited number are seen during each clinic.   [facility]  [address] Ether Griffins [place], [place] [phone]   Eligibility Requirements You must have lived in [place], [place], or [place] counties for at least the last three months.   You cannot be eligible for state or federal sponsored [place], including [employer], [place], or [employer].   You generally cannot be eligible for healthcare insurance through your employer.    How to apply: Eligibility screenings are held every Tuesday and Wednesday afternoon from 1:00 pm until 4:00 pm. You do not need an appointment for the interview!  [facility] [address] [place], [place] [phone]   [facility] Health Department  [phone]   [facility] Health Department  [phone]   [facility] Health Department  [phone]    Behavioral Health Resources in the Community: Intensive Outpatient Programs Organization         Address  Phone  Notes  [facility] [address], [place], [place] [phone]   [facility] [address], [place], [place] [phone]   [facility] [address] [place], [place]  [phone]   [facility] [address],  [place], [place] [phone] or [phone]   Substance Abuse Resources Organization         Address  Phone  Notes  [facility] Drug Services  [phone]   [facility]  [phone]   [facility] [place]  [phone]   Floydene Flock  [phone]   Residential & Outpatient Substance Abuse Program  [phone]    Psychological Services Organization         Address  Phone  Notes  [facility] Behavioral Health  [phone]   [facility] Services  [phone]   [facility] [address] [place] [address] [place] [phone] or [phone]    Mobile Crisis Teams Organization         Address  Phone  Notes  [facility], Mobile Crisis Care Unit  [phone]   [facility]  [address] [place], [place] [phone]   Doristine Locks [address] [place] [phone]    Self-Help/Support Groups Organization         Address  Phone             Notes  [facility] [place] - variety of support groups  [phone]- I7437963 Call for more information  Narcotics Anonymous (NA), [facility] [address], [employer] [place]  2 meetings at this location   Statistician         Address  Phone  Notes  [facility] [address],    [place]  [phone]   [facility]  [address] [place] [address], [place], [place] [phone]   [facility] [address] [place], [place] [phone] Admissions: 8am-3pm M-F  [facility] [address]    [place], [place] [phone]   [facility] [address] [place], [place], [place] [phone]   [facility]  [facility] [address]  [place], [place] [phone]   [facility] - Intensive Outpatient [address], [place], [place] [phone]   [facility] ([facility] [address]  [place], [place] [phone] or [phone]   [facility] ([facility]) [address] [place], [place] [phone] Accepts Medicaid  [facility] [address]  [place] [phone] Substance Abuse/Addiction Treatment   [name] Behavioral Health Resources Organization         Address  Phone  Notes  [facility]  [phone]   Angie FavaJulie Brannon, PhD [address], [address] [place], [place]   [phone] or [phone]   [facility]   [address] Doctor [place], [place] [phone]) [phone]   [facility] [address], [place], [place] [phone]) [phone] Insurance/Medicaid/sponsorship through [facility]  Faith and Families [address]                                    [place], [place] [phone] Therapy/tele-psych/case  [facility] [address].   [place], [place] [phone]) [phone]    Dr. Lolly MustacheArfeen  [phone](336) [phone]   [facility] [place]  [employer] [facility] 1) [address],  2) [address], [place] 3)  [address], [place] [phone]) [phone](336) [phone]  [phone](336) [phone]   [facility] Child Abuse Hotline [phone]) [phone] or [phone]) [phone] (After Hours)
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
## 2014-08-22 NOTE — ED Notes (Signed)
OD  20/30 - 1 OS   20/20 OU   20/20 -1
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
## 2015-03-06 ENCOUNTER — Emergency Department
Admission: EM | Admit: 2015-03-06 | Discharge: 2015-03-07 | Disposition: A | Discharge status: Home / Self Care | Payer: No Typology Code available for payment source | Attending: Emergency Medicine | Admitting: Emergency Medicine

## 2015-03-06 ENCOUNTER — Emergency Department: Payer: No Typology Code available for payment source

## 2015-03-06 DIAGNOSIS — Z87891 Personal history of nicotine dependence: Secondary | ICD-10-CM | POA: Insufficient documentation

## 2015-03-06 DIAGNOSIS — A6009 Herpesviral infection of other urogenital tract: Secondary | ICD-10-CM | POA: Insufficient documentation

## 2015-03-06 DIAGNOSIS — Z79899 Other long term (current) drug therapy: Secondary | ICD-10-CM | POA: Insufficient documentation

## 2015-03-06 DIAGNOSIS — A6000 Herpesviral infection of urogenital system, unspecified: Secondary | ICD-10-CM

## 2015-03-06 MED ORDER — OXYCODONE-ACETAMINOPHEN 5-325 MG PO TABS
2.0000 | ORAL_TABLET | Freq: Once | ORAL | Status: AC
Start: 2015-03-06 — End: 2015-03-06
  Administered 2015-03-06: 2 via ORAL
  Filled 2015-03-06: qty 2

## 2015-03-06 MED ORDER — OXYCODONE-ACETAMINOPHEN 5-325 MG PO TABS
ORAL_TABLET | ORAL | Status: DC
Start: 2015-03-06 — End: 2017-12-20

## 2015-03-06 MED ORDER — VALACYCLOVIR HCL 500 MG PO TABS
1000.0000 mg | ORAL_TABLET | Freq: Two times a day (BID) | ORAL | Status: AC
Start: 2015-03-06 — End: ?

## 2015-03-06 NOTE — Discharge Instructions (Signed)
Please follow up with your primary care physician. If you do not have one you may use the referral line to help you find one.    [facility] Referral Line    You have been referred to a primary care doctor or a specialist for follow-up care. Please call the [facility] referral line and they will be able to help you find a physician in your area that you can follow up with.    Phone: [phone]-DOCS or [phone]      Herpes Genitalis    You have been diagnosed with genital herpes.    Herpes simplex is a common contagious infection caused by a virus. Herpes can infect the mouth and face or the genital area. Genital herpes is a sexually transmitted infection. It passes from person to person during vaginal or anal intercourse (sex). It can also be transmitted during oral sex. Once infected with herpes, you will always have herpes (but may not show symptoms). Herpes sometimes causes painful sores that look like small blisters. Herpes often spreads when someone with these sores has intimate contact (like kissing or sex) with someone without herpes. You are most contagious when you have open sores, but you can still spread it even if you do not have any sores or other symptoms.    Herpes sores develop 2-30 days after exposure to the herpes virus. Other symptoms are tender lymph nodes (lumps in the groin). Sometimes there is fever (temperature higher than 100.40F / 38C), headache or muscle aches. Symptoms usually last 1-2 weeks and get better without treatment. If you get treatment soon enough, symptoms may go away faster.    Antiviral and pain medicines are used in treatment. If used early enough in the course of a herpes outbreak, medicines can shorten the course of the infection and lessen the amount of pain.    Do not have sex until the lesions (sores) fully heal. Use condoms with sex to protect your partner. Even if your herpes rash clears up, you can still be contagious. This means you can spread herpes through sex.  If you partner has any symptoms, he or she should see a doctor right away.    YOU SHOULD SEEK MEDICAL ATTENTION IMMEDIATELY, EITHER HERE OR AT THE NEAREST EMERGENCY DEPARTMENT, IF ANY OF THE FOLLOWING OCCURS:   Headache, stiff neck, confusion or light sensitivity develops.   Lesions get more red or painful or look infected. Some signs of infection are redness, swelling, fever (temperature higher than 100.40F / 38C), pain and/or pus drainage.   There is so much swelling and pain that it is hard to urinate (pee).   For any other new symptoms or concerns.

## 2015-03-06 NOTE — ED Provider Notes (Signed)
[facility] EMERGENCY DEPARTMENT H&P         CLINICAL SUMMARY          Diagnosis:    .     Final diagnoses:   Herpes simplex infection of genitourinary system         MDM Notes:      Pt with clinical exam c/w HSV. Valtrex started empirically in ED.      Disposition:         Discharge         Discharge Prescriptions     Medication Sig Dispense Auth. Provider    valACYclovir HCL (VALTREX) 500 MG tablet Take 2 tablets (1,000 mg total) by mouth 2 (two) times daily. 20 tablet Joellyn Rued, MD    oxyCODONE-acetaminophen (PERCOCET) 5-325 MG per tablet 1-2 tablets by mouth every 4-6 hours as needed for pain;  Do not drive or operate machinery while taking this medicine 15 tablet D'Cruz, Tamsen Meek, MD                    CLINICAL INFORMATION        HPI:      Chief Complaint: Vaginal Lesions  .    Melody Frazier is a 24 y.o. female o/w healthy p/w 2-3 days of painful genital lesions. She denies any vag d/c or fever. Sxs worse with urination. Pt reports just having one female partner over the past year. Denies any h/o genital herpes or cold sores but has chicken pox in the past.    History obtained from: Patient          ROS:      Positive and negative ROS elements as per HPI.  All other systems reviewed and negative.      Physical Exam:      Pulse 98  BP (!) 127/94 mmHg  Resp 18  SpO2 100 %  Temp 99 F (37.2 C)    Physical Exam   Constitutional: She appears well-developed and well-nourished.   HENT:   Head: Normocephalic and atraumatic.   Eyes: EOM are normal. Right eye exhibits no discharge. Left eye exhibits no discharge.   Neck: Normal range of motion. Neck supple.   Cardiovascular: Normal rate and regular rhythm.    Pulmonary/Chest: Effort normal.   Abdominal: Soft. She exhibits no distension. There is no tenderness.   Genitourinary:   Pt refused exam by female physician. Per examining physician, pt with multiple genital vesicular tender lesions   Neurological: She is  alert.   Skin: Skin is warm. Rash noted.   Psychiatric: Her behavior is normal.               PAST HISTORY        Primary Care Provider: Christa See, MD        PMH/PSH:    .     Past Medical History   Diagnosis Date   . Mono exposure        She has past surgical history that includes Wisdom tooth extraction.      Social/Family History:      She reports that she quit smoking about 4 years ago. She does not have any smokeless tobacco history on file. She reports that she drinks alcohol. She reports that she does not use illicit drugs.    History reviewed. No pertinent family history.      Listed Medications on Arrival:    .     Home Medications  traZODone (DESYREL) 50 MG tablet     Take 50 mg by mouth nightly as needed.                                Allergies: She is allergic to tetracyclines & related.            VISIT INFORMATION        Clinical Course in the ED:            Medications Given in the ED:    .     ED Medication Orders     Start Ordered     Status Ordering Provider    03/06/15 2259 03/06/15 2258  oxyCODONE-acetaminophen (PERCOCET) 5-325 MG per tablet 2 tablet   Once     Route: Oral  Ordered Dose: 2 tablet     Last MAR action:  Given D'CRUZ, Adhira Jamil JOSEPH            Procedures:      Procedures      Interpretations:                    RESULTS        Lab Results:      Results     Procedure Component Value Units Date/Time    Herpes Simplex Virus (HSV) culture [[id number]] Collected:  03/06/15 2313    Specimen Information:  Genital Updated:  03/06/15 2313    Narrative:      refrigerated              Radiology Results:      No orders to display               Scribe Attestation:      Attestations:  I was acting as a Neurosurgeon for Joellyn Rued, MD on Algis Liming    I am the first provider for this patient and I personally performed the services documented. Melody Frazier is scribing for me on Melody Frazier. This note accurately reflects work and  decisions made by me.  Joellyn Rued, MD   03/06/2015             Joellyn Rued, MD  03/07/15 [phone]

## 2015-10-09 ENCOUNTER — Emergency Department (HOSPITAL_COMMUNITY)
Admission: EM | Admit: 2015-10-09 | Discharge: 2015-10-11 | Disposition: A | Discharge status: Other Acute Inpatient Hospital | Payer: 59 | Attending: Emergency Medicine | Admitting: Emergency Medicine

## 2015-10-09 ENCOUNTER — Encounter (HOSPITAL_COMMUNITY): Payer: Self-pay | Admitting: Emergency Medicine

## 2015-10-09 DIAGNOSIS — Z79899 Other long term (current) drug therapy: Secondary | ICD-10-CM | POA: Diagnosis not present

## 2015-10-09 DIAGNOSIS — Z008 Encounter for other general examination: Secondary | ICD-10-CM

## 2015-10-09 DIAGNOSIS — F22 Delusional disorders: Secondary | ICD-10-CM | POA: Diagnosis not present

## 2015-10-09 DIAGNOSIS — Z791 Long term (current) use of non-steroidal anti-inflammatories (NSAID): Secondary | ICD-10-CM | POA: Diagnosis not present

## 2015-10-09 DIAGNOSIS — F419 Anxiety disorder, unspecified: Secondary | ICD-10-CM | POA: Insufficient documentation

## 2015-10-09 DIAGNOSIS — Z8669 Personal history of other diseases of the nervous system and sense organs: Secondary | ICD-10-CM | POA: Diagnosis not present

## 2015-10-09 DIAGNOSIS — Z046 Encounter for general psychiatric examination, requested by authority: Secondary | ICD-10-CM | POA: Diagnosis present

## 2015-10-09 DIAGNOSIS — Z87891 Personal history of nicotine dependence: Secondary | ICD-10-CM | POA: Diagnosis not present

## 2015-10-09 DIAGNOSIS — Z8619 Personal history of other infectious and parasitic diseases: Secondary | ICD-10-CM | POA: Insufficient documentation

## 2015-10-09 DIAGNOSIS — F1220 Cannabis dependence, uncomplicated: Secondary | ICD-10-CM | POA: Diagnosis not present

## 2015-10-09 LAB — CBC WITH DIFFERENTIAL/PLATELET
Basophils Absolute: 0 10*3/uL (ref 0.0–0.1)
Basophils Relative: 0 %
Eosinophils Absolute: 0.2 10*3/uL (ref 0.0–0.7)
Eosinophils Relative: 3 %
HCT: 41.4 % (ref 36.0–46.0)
Hemoglobin: 13.8 g/dL (ref 12.0–15.0)
Lymphocytes Relative: 41 %
Lymphs Abs: 2.9 10*3/uL (ref 0.7–4.0)
MCH: 29.8 pg (ref 26.0–34.0)
MCHC: 33.3 g/dL (ref 30.0–36.0)
MCV: 89.4 fL (ref 78.0–100.0)
Monocytes Absolute: 0.6 10*3/uL (ref 0.1–1.0)
Monocytes Relative: 9 %
Neutro Abs: 3.4 10*3/uL (ref 1.7–7.7)
Neutrophils Relative %: 47 %
Platelets: 304 10*3/uL (ref 150–400)
RBC: 4.63 MIL/uL (ref 3.87–5.11)
RDW: 13.2 % (ref 11.5–15.5)
WBC: 7.1 10*3/uL (ref 4.0–10.5)

## 2015-10-09 LAB — COMPREHENSIVE METABOLIC PANEL
ALT: 18 U/L (ref 14–54)
AST: 20 U/L (ref 15–41)
Albumin: 4.3 g/dL (ref 3.5–5.0)
Alkaline Phosphatase: 49 U/L (ref 38–126)
Anion gap: 7 (ref 5–15)
BUN: 11 mg/dL (ref 6–20)
CO2: 28 mmol/L (ref 22–32)
Calcium: 9.2 mg/dL (ref 8.9–10.3)
Chloride: 102 mmol/L (ref 101–111)
Creatinine, Ser: 0.73 mg/dL (ref 0.44–1.00)
GFR calc Af Amer: >60 mL/min (ref >60)
GFR calc non Af Amer: >60 mL/min (ref >60)
Glucose, Bld: 95 mg/dL (ref 65–99)
Potassium: 4.1 mmol/L (ref 3.5–5.1)
Sodium: 137 mmol/L (ref 135–145)
Total Bilirubin: 0.4 mg/dL (ref 0.3–1.2)
Total Protein: 7.1 g/dL (ref 6.5–8.1)

## 2015-10-09 LAB — RAPID URINE DRUG SCREEN, HOSP PERFORMED
Amphetamines: NOT DETECTED
Barbiturates: NOT DETECTED
Benzodiazepines: NOT DETECTED
Cocaine: NOT DETECTED
Opiates: NOT DETECTED
Tetrahydrocannabinol: POSITIVE — AB

## 2015-10-09 LAB — ETHANOL: Alcohol, Ethyl (B): <5 mg/dL (ref <5)

## 2015-10-09 MED ORDER — LORAZEPAM 1 MG PO TABS
1.0000 mg | ORAL_TABLET | Freq: Three times a day (TID) | ORAL | Status: DC | PRN
  Administered 2015-10-09 – 2015-10-10 (×2): 1 mg via ORAL
  Filled 2015-10-09 (×2): qty 1

## 2015-10-09 MED ORDER — LORAZEPAM 2 MG/ML IJ SOLN
2.0000 mg | Freq: Once | INTRAMUSCULAR | Status: AC
  Administered 2015-10-09: 2 mg via INTRAMUSCULAR
  Filled 2015-10-09: qty 1

## 2015-10-09 MED ORDER — DIPHENHYDRAMINE HCL 50 MG/ML IJ SOLN
50.0000 mg | Freq: Once | INTRAMUSCULAR | Status: AC
  Administered 2015-10-09: 50 mg via INTRAMUSCULAR
  Filled 2015-10-09: qty 1

## 2015-10-09 NOTE — BH Assessment (Addendum)
Tele Assessment Note   Stacy Poole is an 25 y.o. female brought in on an IVC initiated by her mother. Patient was unable to be assessed due to being aggressive and would not answer any questions at this time. Patient's case was staffed with Welford Roche NP who stated patient will be re-evaluated in the a.m. to determine disposition.     Diagnosis: Substance Abuse D/O   Past Medical History:  Past Medical History  Diagnosis Date  . Anxiety   . Substance abuse   . Acute ear infection   . Mononucleosis   . Fifth disease   . Mental disorder   . Arachnoid cyst     History reviewed. No pertinent past surgical history.  Family History: No family history on file.  Social History:  reports that she has quit smoking. Her smoking use included Cigarettes. She smoked 0.50 packs per day. She has never used smokeless tobacco. She reports that she does not drink alcohol or use illicit drugs.  Additional Social History:  Alcohol / Drug Use Pain Medications: See MAR Prescriptions: See MAR Over the Counter: See MAR History of alcohol / drug use?: Yes (Postive for THC on admission)  CIWA: CIWA-Ar BP: 148/92 mmHg Pulse Rate: 86 COWS:    PATIENT STRENGTHS: (choose at least two)   Allergies:  Allergies  Allergen Reactions  . Tetracyclines & Related Other (See Comments)    Ear popping, couldn't move neck/back, and had blind spots/double vision    Home Medications:  (Not in a hospital admission)  OB/GYN Status:  No LMP recorded.  General Assessment Data Location of Assessment: [facility] ED TTS Assessment: In system Is this a Tele or Face-to-Face Assessment?: Face-to-Face Is this an Initial Assessment or a Re-assessment for this encounter?: Initial Assessment Marital status: Other (comment) Rich Reining) Maiden name: na Is patient pregnant?: Unknown Pregnancy Status: Unknown Living Arrangements: Other (Comment) (Unknown UTA) Can pt return to current living arrangement?:  (Unknown) Admission  Status: Involuntary Is patient capable of signing voluntary admission?: No Referral Source: Self/Family/Friend Insurance type: Unknown  Medical Screening Exam [facility] Walk-in ONLY) Medical Exam completed: Yes  Crisis Care Plan Living Arrangements: Other (Comment) (Unknown UTA) Legal Guardian: Other: (None) Name of Psychiatrist: Unknown Name of Therapist: Unknown  Education Status Is patient currently in school?: No Current Grade: na Highest grade of school patient has completed: Unknown Name of school: Unknown Contact person: Unknown  Risk to self with the past 6 months Suicidal Ideation:  (Unknown UTA) Has patient been a risk to self within the past 6 months prior to admission? : Other (comment) (UTA) Suicidal Intent:  (UTA) Has patient had any suicidal intent within the past 6 months prior to admission? :  (UTA) Is patient at risk for suicide?:  (UTA) Suicidal Plan?:  (UTA) Has patient had any suicidal plan within the past 6 months prior to admission? :  (UTA) Access to Means:  (UTA) What has been your use of drugs/alcohol within the last 12 months?: UTA Previous Attempts/Gestures: Yes (UTA) How many times?:  (Unknown UTA) Other Self Harm Risks:  (UTA UNknown) Triggers for Past Attempts: Unknown Intentional Self Injurious Behavior:  (UTA) Family Suicide History: Unable to assess Recent stressful life event(s): Other (Comment) (UTA) Persecutory voices/beliefs?: No (UTA) Depression: No (UTA) Depression Symptoms:  (UTA) Substance abuse history and/or treatment for substance abuse?: Yes Suicide prevention information given to non-admitted patients: Not applicable  Risk to Others within the past 6 months Homicidal Ideation: No (UTA) Does patient have any  lifetime risk of violence toward others beyond the six months prior to admission? : Unknown Thoughts of Harm to Others: No (UTA) Current Homicidal Intent: No Current Homicidal Plan:  (UTA) Access to Homicidal Means:   (UTA) Identified Victim: Unknown History of harm to others?:  (UTA) Assessment of Violence: On admission Violent Behavior Description: Agitated Does patient have access to weapons?:  (Unknown) Criminal Charges Pending?:  (Unknown) Does patient have a court date:  (UTA) Is patient on probation?: Unknown  Psychosis Hallucinations:  (UTA) Delusions:  (UTA)  Mental Status Report Appearance/Hygiene: In scrubs Eye Contact: Poor Motor Activity: Agitation Speech: Aggressive Level of Consciousness: Combative Mood: Anxious Affect: Angry, Anxious Anxiety Level: Severe Thought Processes: Unable to Assess Judgement: Unable to Assess Orientation: Unable to assess Obsessive Compulsive Thoughts/Behaviors: Unable to Assess  Cognitive Functioning Concentration: Unable to Assess Memory: Unable to Assess IQ:  (UTA) Insight: Unable to Assess Impulse Control: Unable to Assess Appetite:  (UTA) Weight Loss: 0 Weight Gain: 0 Sleep: Unable to Assess Total Hours of Sleep: 0 (UTA) Vegetative Symptoms: Unable to Assess  ADLScreening [facility] Assessment Services) Patient's cognitive ability adequate to safely complete daily activities?: No (UTA) Patient able to express need for assistance with ADLs?: No (UTA) Independently performs ADLs?:  (UTA)  Prior Inpatient Therapy Prior Inpatient Therapy: Yes Prior Therapy Dates: 2015 Prior Therapy Facilty/Provider(s): [facility] per notes Reason for Treatment: Sa use  Prior Outpatient Therapy Prior Outpatient Therapy:  (Unknown) Prior Therapy Dates: Unknown Prior Therapy Facilty/Provider(s): Unknown Reason for Treatment: Unknown Does patient have an ACCT team?: Unknown Does patient have Intensive In-House Services?  : Unknown Does patient have [facility] services? : Unknown Does patient have P4CC services?: Unknown  ADL Screening (condition at time of admission) Patient's cognitive ability adequate to safely complete daily activities?: No (UTA) Is  the patient deaf or have difficulty hearing?: No Does the patient have difficulty seeing, even when wearing glasses/contacts?: No Does the patient have difficulty concentrating, remembering, or making decisions?: Yes (Patient was UTA) Patient able to express need for assistance with ADLs?: No (UTA) Does the patient have difficulty dressing or bathing?: No Independently performs ADLs?:  (UTA) Does the patient have difficulty walking or climbing stairs?: No Weakness of Legs: None Weakness of Arms/Hands: None  Home Assistive Devices/Equipment Home Assistive Devices/Equipment: None  Therapy Consults (therapy consults require a physician order) PT Evaluation Needed: No OT Evalulation Needed: No SLP Evaluation Needed: No Abuse/Neglect Assessment (Assessment to be complete while patient is alone) Physical Abuse: Denies (UTA) Verbal Abuse: Denies (UTA) Sexual Abuse: Denies (UTA) Exploitation of patient/patient's resources: Denies 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) Self-Neglect: Denies (UTA) Values / Beliefs Cultural Requests During Hospitalization: None Spiritual Requests During Hospitalization: None Consults Spiritual Care Consult Needed: No Social Work Consult Needed: No Merchant navy officer (For Healthcare) Does patient have an advance directive?: No (Unknown patient was UTA) Would patient like information on creating an advanced directive?: No - patient declined information    Additional Information 1:1 In Past 12 Months?:  (UTA) CIRT Risk: Yes Elopement Risk: Yes Does patient have medical clearance?: Yes     Disposition: Patient was unable to be assessed due to being aggressive and would not answer any questions at this time. Patient's case was staffed with Welford Roche NP who stated patient will be re-evaluated in the a.m. to determine disposition.     Disposition Initial Assessment Completed for this Encounter: Yes Disposition of Patient: Inpatient treatment program (Patient is on IVC) Type of inpatient  treatment program: Adult  Alfredia Ferguson  10/09/2015 3:43 PM

## 2015-10-09 NOTE — ED Notes (Signed)
Pt sleeping at present, no distress noted, calm & cooperative.  Monitoring for safety, Q 15 min checks in effect. 

## 2015-10-09 NOTE — ED Provider Notes (Signed)
CSN: [id number]     Arrival date & time 10/09/15  1147 History   First MD Initiated Contact with Patient 10/09/15 1206     Chief Complaint  Patient presents with  . Medical Clearance   HPI   25 year old female presents with [facility] for ankle evaluation. [employer] states that they were called out to the house by patient's mother. They report that patient has been living in a hotel with her significant other pain for by her mother. She notes mother wanted her admitted for psychiatric evaluation, and is presently at the [facility] office attempting IVC papers. Uncertain why mother wants her IVC at this time.  Patient reports that her mother is "workaholic" and was "freaking out". Patient's speech is clear and coherent, but does not reveal any indication as to why she is here or her mother wanted her ear. She denies any suicidal homicidal ideations, no hallucinations, she denies any pain, weakness, dizziness, headache, fever or chills, or any physical complaints.  Past Medical History  Diagnosis Date  . Anxiety   . Substance abuse   . Acute ear infection   . Mononucleosis   . Fifth disease   . Mental disorder   . Arachnoid cyst    History reviewed. No pertinent past surgical history. No family history on file. Social History  Substance Use Topics  . Smoking status: Former Smoker -- 0.50 packs/day    Types: Cigarettes  . Smokeless tobacco: Never Used  . Alcohol Use: No     Comment: daily   OB History    No data available     Review of Systems  All other systems reviewed and are negative.   Allergies  Tetracyclines & related  Home Medications   Prior to Admission medications   Medication Sig Start Date End Date Taking? Authorizing Provider  acetaZOLAMIDE (DIAMOX) 125 MG tablet Take 500 mg by mouth 2 (two) times daily. Unknown dosage, mother will verify.    Historical Provider, MD  cholecalciferol (VITAMIN D) 1000 UNITS tablet Take 1,000 Units by mouth daily.     Historical Provider, MD  cyclobenzaprine (FLEXERIL) 10 MG tablet Take 1 tablet (10 mg total) by mouth 2 (two) times daily as needed for muscle spasms. 08/22/13   Vanetta Mulders, MD  fish oil-omega-3 fatty acids 1000 MG capsule Take 1 g by mouth 3 (three) times daily.    Historical Provider, MD  ibuprofen (ADVIL,MOTRIN) 800 MG tablet Take 1 tablet (800 mg total) by mouth 3 (three) times daily. 08/22/14   Hannah Muthersbaugh, PA-C  LORazepam (ATIVAN) 1 MG tablet Take 1 tablet (1 mg total) by mouth 3 (three) times daily as needed for anxiety. 08/22/13   Vanetta Mulders, MD  methocarbamol (ROBAXIN) 500 MG tablet Take 1 tablet (500 mg total) by mouth 2 (two) times daily. 08/22/14   Hannah Muthersbaugh, PA-C  naphazoline-pheniramine (NAPHCON-A) 0.025-0.3 % ophthalmic solution Place 1 drop into the right eye every 4 (four) hours as needed for irritation. 08/22/14   Hannah Muthersbaugh, PA-C  naproxen (NAPROSYN) 500 MG tablet Take 1 tablet (500 mg total) by mouth 2 (two) times daily. 08/22/13   Vanetta Mulders, MD  traMADol (ULTRAM) 50 MG tablet Take 1 tablet (50 mg total) by mouth every 6 (six) hours as needed. 08/22/13   Vanetta Mulders, MD  traZODone (DESYREL) 150 MG tablet Take 1 tablet (150 mg total) by mouth at bedtime. 02/28/14   Hayden Rasmussen, NP  vitamin E (VITAMIN E) 400 UNIT capsule Take 400 Units  by mouth daily.    Historical Provider, MD   BP 148/92 mmHg  Pulse 86  Temp(Src) 98 F (36.7 C) (Oral)  Resp 16  SpO2 100%   Physical Exam  Constitutional: She is oriented to person, place, and time. She appears well-developed and well-nourished.  HENT:  Head: Normocephalic and atraumatic.  Eyes: Conjunctivae are normal. Pupils are equal, round, and reactive to light. Right eye exhibits no discharge. Left eye exhibits no discharge. No scleral icterus.  Neck: Normal range of motion. No JVD present. No tracheal deviation present.  Cardiovascular: Normal rate, regular rhythm, normal heart sounds and  intact distal pulses.  Exam reveals no gallop and no friction rub.   No murmur heard. Pulmonary/Chest: Effort normal and breath sounds normal. No stridor. No respiratory distress. She has no wheezes. She has no rales. She exhibits no tenderness.  Neurological: She is alert and oriented to person, place, and time. Coordination normal.  Skin: Skin is warm and dry. No rash noted. No erythema. No pallor.  Psychiatric: Judgment and thought content normal.  Nursing note and vitals reviewed.   ED Course  Procedures (including critical care time) Labs Review Labs Reviewed  URINE RAPID DRUG SCREEN, HOSP PERFORMED - Abnormal; Notable for the following:    Tetrahydrocannabinol POSITIVE (*)    All other components within normal limits  COMPREHENSIVE METABOLIC PANEL  ETHANOL  CBC WITH DIFFERENTIAL/PLATELET    Imaging Review No results found. I have personally reviewed and evaluated these images and lab results as part of my medical decision-making.   EKG Interpretation None      MDM   Final diagnoses:  Medical clearance for psychiatric admission    Labs: Urine rapid drug screen, CMP, ethanol, CBC  Imaging: EKG  Consults:  Therapeutics:   Discharge Meds:   Assessment/Plan: 25 year old female presents today at the request of her mother for psychiatric evaluation. Other and involuntary commitment papers taken out on the patient. Patient is anxious, pacing, she has coherent thoughts but abnormal behaviors, pacing. Ears to be in no acute distress, afebrile, nontoxic. Labs showed no significant findings, patient will be transferred to behavioral health.        Eyvonne Mechanic, PA-C 10/09/15 1510  Rolland Porter, MD 10/13/15 [phone]

## 2015-10-09 NOTE — ED Notes (Signed)
With triage pt reports mother sent her because "she [mother] is a workaholic" and "started covering me [pt] all up because I [pt] wanted some coffee." Pt observed sniffing empty pill container and responding "with your mom" while laughing when Hedges, provider,  asked where she lived. Pt denies SI/HI or A/VH.

## 2015-10-09 NOTE — ED Notes (Signed)
Patient vacilates between being angry,tearful, and bizarre.  She is continuously on the phone and is argumentative.  Difficult to redirect.  Food/fluids given.  She is unable to give an accurate account of her drug use for the past few days.  Support offered and POC discussed.

## 2015-10-09 NOTE — ED Notes (Signed)
Patient and belongings have been wanded.  

## 2015-10-09 NOTE — ED Notes (Signed)
Pt cursing, pacing, agitated because she cannot use the phone or drink coffee after department hours.

## 2015-10-10 DIAGNOSIS — F1220 Cannabis dependence, uncomplicated: Secondary | ICD-10-CM | POA: Diagnosis not present

## 2015-10-10 DIAGNOSIS — F22 Delusional disorders: Secondary | ICD-10-CM | POA: Diagnosis not present

## 2015-10-10 DIAGNOSIS — Z008 Encounter for other general examination: Secondary | ICD-10-CM | POA: Insufficient documentation

## 2015-10-10 MED ORDER — CARBAMAZEPINE ER 200 MG PO CP12
200.0000 mg | ORAL_CAPSULE | Freq: Two times a day (BID) | ORAL | Status: DC
  Administered 2015-10-10 – 2015-10-11 (×3): 200 mg via ORAL
  Filled 2015-10-10 (×4): qty 1

## 2015-10-10 MED ORDER — LORAZEPAM 1 MG PO TABS
2.0000 mg | ORAL_TABLET | Freq: Once | ORAL | Status: AC
  Administered 2015-10-10: 2 mg via ORAL
  Filled 2015-10-10: qty 2

## 2015-10-10 MED ORDER — BENZTROPINE MESYLATE 1 MG PO TABS
0.5000 mg | ORAL_TABLET | Freq: Two times a day (BID) | ORAL | Status: DC
  Administered 2015-10-10 – 2015-10-11 (×3): 0.5 mg via ORAL
  Filled 2015-10-10 (×3): qty 1

## 2015-10-10 MED ORDER — HALOPERIDOL 2 MG PO TABS
2.0000 mg | ORAL_TABLET | Freq: Two times a day (BID) | ORAL | Status: DC
  Administered 2015-10-10 – 2015-10-11 (×2): 2 mg via ORAL
  Filled 2015-10-10 (×3): qty 1

## 2015-10-10 NOTE — ED Notes (Signed)
Pt was becoming increasingly agitated and delusional.  She was visiting with her mother and mother thought she should leave due to agitation.  Pt is constantly fidgeting with her hair and her home made headbands made from her scrubs.  She also removed EKG leads from her body and placed them on her face.  She stated "they are holding my head on, I'm Lowella Fairy"

## 2015-10-10 NOTE — Progress Notes (Signed)
D Pt. Denies SI and HI< no complaints of pain or discomfort noted at present time.  Pt. Became very agitated immediately after waking up from her evening nap.  Pt. Called her Mother and screamed and cursed the Mom because she could not come back to  Visit d/t visiting hours ending in at time of the call.  A Writer offered support and encouragement,  Attempted to discuss reason for admission, and redirect pt. Writer also requested an extra PRN dose of ativan to calm the pt.  R Pt. Is difficult to redirect. Pt. Did take the ativan but refused her HS dose of haldol.  Pt. Watched the superbowl and applauded the " [place] [employer]" during the game.  Pt. Is at times difficult to redirect.  Pt. Has taken 2 showers and is obsessed with her hair this pm.   Will continue to monitor pt. To ensure pt. Safety.  Pt. Presently remains safe on the unit.
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
## 2015-10-10 NOTE — Progress Notes (Signed)
Disposition CSW completed patient referrals to the following inpatient psych facilities:  [facility]  CSW will continue to follow patient for placement needs.  [facility] Disposition CSW [phone]

## 2015-10-10 NOTE — ED Notes (Signed)
Pt is very delusional, speaking of security clearance and isis.  Her mood is very labile as she talks about different subjects.  She has written on the tv covering and placed name stickers all over her clothes.  She denies SI and HI.  She denies AVH but appears to be responding to internal stimuli.  15 minute checks and video monitoring continue.

## 2015-10-10 NOTE — BHH Counselor (Signed)
Sera School called from [facility] indicating that patient has been accepted by Dr. Regino Schultze. Patient cannot be admitted until tomorrow on 10/11/2015. Call [phone] for report.     Davina Poke, LCSW Therapeutic Triage Specialist [facility] 10/10/2015 2:57 PM

## 2015-10-11 MED ORDER — NICOTINE 14 MG/24HR TD PT24
14.0000 mg | MEDICATED_PATCH | Freq: Once | TRANSDERMAL | Status: DC
  Filled 2015-10-11: qty 1

## 2015-10-11 NOTE — ED Notes (Signed)
Patient discharged to [facility].  Left the unit ambulatory with [facility].  All belongings given to the [place].

## 2015-10-11 NOTE — ED Notes (Signed)
Patient has been labile this morning, tearful at times, grandiose, irritable and laughing inappropriately at times.  She was on the phone with her mother and became very angry.  Mother then called me and she was notified of pending move (there is a release in the chart).  She was also given the phone number to [facility] so that she could call them and learn what she could have there and what the visitation was.

## 2015-11-12 ENCOUNTER — Emergency Department (HOSPITAL_COMMUNITY)
Admission: EM | Admit: 2015-11-12 | Discharge: 2015-11-13 | Disposition: A | Discharge status: Other Acute Inpatient Hospital | Payer: 59 | Attending: Emergency Medicine | Admitting: Emergency Medicine

## 2015-11-12 ENCOUNTER — Encounter (HOSPITAL_COMMUNITY): Payer: Self-pay | Admitting: Emergency Medicine

## 2015-11-12 DIAGNOSIS — Z3202 Encounter for pregnancy test, result negative: Secondary | ICD-10-CM | POA: Insufficient documentation

## 2015-11-12 DIAGNOSIS — Z8619 Personal history of other infectious and parasitic diseases: Secondary | ICD-10-CM | POA: Diagnosis not present

## 2015-11-12 DIAGNOSIS — F22 Delusional disorders: Secondary | ICD-10-CM | POA: Diagnosis not present

## 2015-11-12 DIAGNOSIS — F29 Unspecified psychosis not due to a substance or known physiological condition: Secondary | ICD-10-CM | POA: Diagnosis not present

## 2015-11-12 DIAGNOSIS — Z008 Encounter for other general examination: Secondary | ICD-10-CM | POA: Diagnosis present

## 2015-11-12 DIAGNOSIS — F1310 Sedative, hypnotic or anxiolytic abuse, uncomplicated: Secondary | ICD-10-CM | POA: Insufficient documentation

## 2015-11-12 DIAGNOSIS — Z87891 Personal history of nicotine dependence: Secondary | ICD-10-CM | POA: Diagnosis not present

## 2015-11-12 DIAGNOSIS — Z8669 Personal history of other diseases of the nervous system and sense organs: Secondary | ICD-10-CM | POA: Insufficient documentation

## 2015-11-12 LAB — COMPREHENSIVE METABOLIC PANEL
ALT: 17 U/L (ref 14–54)
AST: 24 U/L (ref 15–41)
Albumin: 4.3 g/dL (ref 3.5–5.0)
Alkaline Phosphatase: 42 U/L (ref 38–126)
Anion gap: 7 (ref 5–15)
BILIRUBIN TOTAL: 0.4 mg/dL (ref 0.3–1.2)
BUN: 13 mg/dL (ref 6–20)
CALCIUM: 9.3 mg/dL (ref 8.9–10.3)
CHLORIDE: 106 mmol/L (ref 101–111)
CO2: 26 mmol/L (ref 22–32)
CREATININE: 0.58 mg/dL (ref 0.44–1.00)
GFR calc non Af Amer: >60 mL/min (ref >60)
Glucose, Bld: 109 mg/dL — ABNORMAL HIGH (ref 65–99)
Potassium: 4.3 mmol/L (ref 3.5–5.1)
Sodium: 139 mmol/L (ref 135–145)
TOTAL PROTEIN: 7.1 g/dL (ref 6.5–8.1)

## 2015-11-12 LAB — CBC
HEMATOCRIT: 40.1 % (ref 36.0–46.0)
Hemoglobin: 13.4 g/dL (ref 12.0–15.0)
MCH: 29.7 pg (ref 26.0–34.0)
MCHC: 33.4 g/dL (ref 30.0–36.0)
MCV: 88.9 fL (ref 78.0–100.0)
Platelets: 258 10*3/uL (ref 150–400)
RBC: 4.51 MIL/uL (ref 3.87–5.11)
RDW: 13.3 % (ref 11.5–15.5)
WBC: 7.2 10*3/uL (ref 4.0–10.5)

## 2015-11-12 LAB — SALICYLATE LEVEL

## 2015-11-12 LAB — ACETAMINOPHEN LEVEL: Acetaminophen (Tylenol), Serum: <10 ug/mL — ABNORMAL LOW (ref 10–30)

## 2015-11-12 LAB — ETHANOL

## 2015-11-12 LAB — I-STAT BETA HCG BLOOD, ED (MC, WL, AP ONLY): I-stat hCG, quantitative: <5 m[IU]/mL (ref <5)

## 2015-11-12 MED ORDER — HYDROXYZINE HCL 25 MG PO TABS
75.0000 mg | ORAL_TABLET | Freq: Once | ORAL | Status: AC
  Administered 2015-11-12: 75 mg via ORAL
  Filled 2015-11-12: qty 3

## 2015-11-12 MED ORDER — IBUPROFEN 200 MG PO TABS
600.0000 mg | ORAL_TABLET | Freq: Three times a day (TID) | ORAL | Status: DC | PRN
  Administered 2015-11-12: 600 mg via ORAL
  Filled 2015-11-12: qty 3

## 2015-11-12 MED ORDER — LORAZEPAM 1 MG PO TABS
1.0000 mg | ORAL_TABLET | Freq: Three times a day (TID) | ORAL | Status: DC | PRN
  Administered 2015-11-12 – 2015-11-13 (×2): 1 mg via ORAL
  Filled 2015-11-12 (×2): qty 1

## 2015-11-12 MED ORDER — ONDANSETRON HCL 4 MG PO TABS: 4.0000 mg | ORAL_TABLET | Freq: Three times a day (TID) | ORAL | Status: DC | PRN

## 2015-11-12 MED ORDER — LORAZEPAM 1 MG PO TABS
2.0000 mg | ORAL_TABLET | Freq: Once | ORAL | Status: AC
  Administered 2015-11-12: 2 mg via ORAL
  Filled 2015-11-12: qty 2

## 2015-11-12 NOTE — ED Notes (Signed)
Report received from Stacy Poole. Pt. Alert and oriented in no distress denies SI, HI, AVH and pain.  Pt. Instructed to come to me with problems or concerns.Will continue to monitor for safety via security cameras and Q 15 minute checks. 

## 2015-11-12 NOTE — ED Notes (Signed)
Pt. Noted in room sitting and then standing on shelf at the head of the bed. Pt. Instructed to come down off the shelf for her safety. Will continue to monitor with security cameras. Q 15 minute rounds continue.
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
## 2015-11-12 NOTE — ED Notes (Signed)
Info PT of urine sample. PT did go to restroom to change did not get urine

## 2015-11-12 NOTE — ED Notes (Signed)
Pt. Yelling in room intermittently. Redirection minimally effective,

## 2015-11-12 NOTE — ED Provider Notes (Signed)
CSN: [id number]     Arrival date & time 11/12/15  1256 History   First MD Initiated Contact with Patient 11/12/15 1317     Chief Complaint  Patient presents with  . Medical Clearance     (Consider location/radiation/quality/duration/timing/severity/associated sxs/prior Treatment) HPI   25 year old female brought in by police for psychiatric evaluation. Police at bedside. They report that mother called them yesterday because of patient having increasingly erratic behavior. Patient told them that she wanted treatment and [facility]. Patient is not willing to participate much on exam with me. Apparently [facility] but apparently too high of acuity for them to manage and was brought to the emergency room. She is very untrusting. Accusing me of laughing at her. Telling me it is against "[employer] [employer]" for me to treat her like I am.  Delusional. Paranoid. Reports she cannot sleep. Not sure the last time she slept. Stop taking her medications. She felt like they're making her suicidal. She smokes. Denies any drug or alcohol use. Denies HI or hallucinations.   Past Medical History  Diagnosis Date  . Anxiety   . Substance abuse   . Acute ear infection   . Mononucleosis   . Fifth disease   . Mental disorder   . Arachnoid cyst    No past surgical history on file. No family history on file. Social History  Substance Use Topics  . Smoking status: Former Smoker -- 0.50 packs/day    Types: Cigarettes  . Smokeless tobacco: Never Used  . Alcohol Use: No     Comment: daily   OB History    No data available     Review of Systems  Level V caveat because of psychosis.   Allergies  Tetracyclines & related  Home Medications   Prior to Admission medications   Not on File   There were no vitals taken for this visit. Physical Exam  Constitutional: She appears well-developed and well-nourished. No distress.  HENT:  Head: Normocephalic and  atraumatic.  Eyes: Conjunctivae are normal. Right eye exhibits no discharge. Left eye exhibits no discharge.  Neck: Neck supple.  Cardiovascular: Normal rate, regular rhythm and normal heart sounds.  Exam reveals no gallop and no friction rub.   No murmur heard. Pulmonary/Chest: Effort normal and breath sounds normal. No respiratory distress.  Abdominal: Soft. She exhibits no distension. There is no tenderness.  Musculoskeletal: She exhibits no edema or tenderness.  Neurological: She is alert.  Skin: Skin is warm and dry.  Psychiatric:  Initially she was sitting in chair police in her room. When engaging her she became increasingly are chronic and monitor. Delusional thoughts. Somewhat paranoid.  Nursing note and vitals reviewed.   ED Course  Procedures (including critical care time) Labs Review Labs Reviewed  COMPREHENSIVE METABOLIC PANEL  ETHANOL  SALICYLATE LEVEL  ACETAMINOPHEN LEVEL  CBC  URINE RAPID DRUG SCREEN, HOSP PERFORMED  I-STAT BETA HCG BLOOD, ED ([facility], [facility], [facility] ONLY)    Imaging Review No results found. I have personally reviewed and evaluated these images and lab results as part of my medical decision-making.   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 Medically cleared. Is in need of psychiatric stabilization.    Raeford RazorStephen Mckynzie Liwanag, MD 11/14/15 2248

## 2015-11-12 NOTE — ED Notes (Signed)
Pt. C/o anxiety and head pain at 9 level.

## 2015-11-12 NOTE — ED Notes (Signed)
MD at bedside. 

## 2015-11-12 NOTE — ED Notes (Signed)
Pt. Noted in room. No complaints or concerns voiced. No distress or abnormal behavior noted. Will continue to monitor with security cameras. Q 15 minute rounds continue. 

## 2015-11-12 NOTE — BH Assessment (Signed)
Assessment completed. Consulted with Nanine MeansJamison Lord, DNP who recommends inpatient treatment at this time.  Spoke with Gretta ArabKenisha Aviela Blundell, RN, [facility] who will review [facility].   Davina PokeJoVea Anis Degidio, LCSW Therapeutic Triage Specialist [facility] 11/12/2015 2:38 PM

## 2015-11-12 NOTE — ED Notes (Signed)
In the bathroom to shower and change scrubs 

## 2015-11-12 NOTE — ED Notes (Signed)
Pt. Pacing in hall anxious and irritable.

## 2015-11-12 NOTE — ED Notes (Signed)
Per IVC-[facility] they can not handle her-paper work states she is delusional, threatened to kill self

## 2015-11-13 ENCOUNTER — Encounter (HOSPITAL_COMMUNITY): Payer: Self-pay

## 2015-11-13 ENCOUNTER — Inpatient Hospital Stay (HOSPITAL_COMMUNITY)
Admission: AD | Admit: 2015-11-13 | Discharge: 2015-11-18 | DRG: 897 | Disposition: A | Discharge status: Home / Self Care | Payer: 59 | Attending: Psychiatry | Admitting: Psychiatry

## 2015-11-13 ENCOUNTER — Encounter (HOSPITAL_COMMUNITY): Payer: Self-pay | Admitting: Registered Nurse

## 2015-11-13 DIAGNOSIS — F1220 Cannabis dependence, uncomplicated: Secondary | ICD-10-CM | POA: Diagnosis present

## 2015-11-13 DIAGNOSIS — G4700 Insomnia, unspecified: Secondary | ICD-10-CM | POA: Diagnosis present

## 2015-11-13 DIAGNOSIS — Z9114 Patient's other noncompliance with medication regimen: Secondary | ICD-10-CM | POA: Diagnosis not present

## 2015-11-13 DIAGNOSIS — F1924 Other psychoactive substance dependence with psychoactive substance-induced mood disorder: Principal | ICD-10-CM | POA: Diagnosis present

## 2015-11-13 DIAGNOSIS — F22 Delusional disorders: Secondary | ICD-10-CM | POA: Diagnosis present

## 2015-11-13 DIAGNOSIS — F411 Generalized anxiety disorder: Secondary | ICD-10-CM | POA: Diagnosis present

## 2015-11-13 DIAGNOSIS — F1020 Alcohol dependence, uncomplicated: Secondary | ICD-10-CM | POA: Diagnosis present

## 2015-11-13 DIAGNOSIS — F1994 Other psychoactive substance use, unspecified with psychoactive substance-induced mood disorder: Secondary | ICD-10-CM | POA: Diagnosis not present

## 2015-11-13 DIAGNOSIS — Z87891 Personal history of nicotine dependence: Secondary | ICD-10-CM | POA: Diagnosis not present

## 2015-11-13 DIAGNOSIS — F19929 Other psychoactive substance use, unspecified with intoxication, unspecified: Secondary | ICD-10-CM | POA: Diagnosis present

## 2015-11-13 LAB — RAPID URINE DRUG SCREEN, HOSP PERFORMED
Amphetamines: NOT DETECTED
BARBITURATES: NOT DETECTED
BENZODIAZEPINES: POSITIVE — AB
COCAINE: NOT DETECTED
OPIATES: NOT DETECTED
Tetrahydrocannabinol: NOT DETECTED

## 2015-11-13 MED ORDER — LORAZEPAM 0.5 MG PO TABS
0.5000 mg | ORAL_TABLET | Freq: Four times a day (QID) | ORAL | Status: DC | PRN
  Administered 2015-11-13 – 2015-11-16 (×6): 0.5 mg via ORAL
  Filled 2015-11-13 (×6): qty 1

## 2015-11-13 MED ORDER — ACETAMINOPHEN 325 MG PO TABS: 650.0000 mg | ORAL_TABLET | Freq: Four times a day (QID) | ORAL | Status: DC | PRN

## 2015-11-13 MED ORDER — HYDROXYZINE HCL 50 MG PO TABS
75.0000 mg | ORAL_TABLET | Freq: Every day | ORAL | Status: DC
  Administered 2015-11-13 – 2015-11-17 (×5): 75 mg via ORAL
  Filled 2015-11-13 (×9): qty 1

## 2015-11-13 MED ORDER — HYDROXYZINE HCL 25 MG PO TABS
25.0000 mg | ORAL_TABLET | Freq: Once | ORAL | Status: AC
  Administered 2015-11-13: 25 mg via ORAL
  Filled 2015-11-13: qty 1

## 2015-11-13 MED ORDER — ALUM & MAG HYDROXIDE-SIMETH 200-200-20 MG/5ML PO SUSP
30.0000 mL | Freq: Once | ORAL | Status: AC
  Administered 2015-11-13: 30 mL via ORAL
  Filled 2015-11-13: qty 30

## 2015-11-13 MED ORDER — ALUM & MAG HYDROXIDE-SIMETH 200-200-20 MG/5ML PO SUSP
30.0000 mL | ORAL | Status: DC | PRN
  Administered 2015-11-13: 30 mL via ORAL
  Filled 2015-11-13: qty 30

## 2015-11-13 MED ORDER — MAGNESIUM HYDROXIDE 400 MG/5ML PO SUSP: 30.0000 mL | Freq: Every day | ORAL | Status: DC | PRN

## 2015-11-13 NOTE — ED Notes (Addendum)
Pt is aware that is going to be transferred to [facility] for [facility], pt tearful, blaming her mother for her problems.  Pt reports that she has been calling her mother and hanging up, to hurt her and that she does not want any information given to her.  Pt instructed not to keep calling her mother until she is ready to talk to her calmly.  Pt verbalized understanding.

## 2015-11-13 NOTE — ED Notes (Signed)
Pt. Noted sleeping in room. No complaints or concerns voiced. No distress or abnormal behavior noted. Will continue to monitor with security cameras. Q 15 minute rounds continue. 

## 2015-11-13 NOTE — Tx Team (Signed)
Initial Interdisciplinary Treatment Plan   PATIENT STRESSORS: Marital or family conflict Medication change or noncompliance   PATIENT STRENGTHS: Wellsite geologistCommunication skills General fund of knowledge   PROBLEM LIST: Problem List/Patient Goals Date to be addressed Date deferred Reason deferred Estimated date of resolution  Delusional 11/13/15     Depression 11/13/15     Anxiety 11/13/15     "To leave" 11/13/15     "To sleep" 11/13/15                              DISCHARGE CRITERIA:  Adequate post-discharge living arrangements Need for constant or close observation no longer present Verbal commitment to aftercare and medication compliance  PRELIMINARY DISCHARGE PLAN: Outpatient therapy Medication management  PATIENT/FAMIILY INVOLVEMENT: This treatment plan has been presented to and reviewed with the patient, Stacy PilaStephanie W Fazzino.  The patient and family have been given the opportunity to ask questions and make suggestions.  Norm ParcelHeather V Dorleen Kissel 11/13/2015, 7:07 PM

## 2015-11-13 NOTE — ED Notes (Addendum)
[facility] w/ [employer].  Belongings sent w/ [employer].

## 2015-11-13 NOTE — ED Notes (Signed)
Pt. Noted in room. No complaints or concerns voiced. No distress or abnormal behavior noted. Will continue to monitor with security cameras. Q 15 minute rounds continue. 

## 2015-11-13 NOTE — ED Notes (Signed)
[facility] contacted for transport 
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
## 2015-11-13 NOTE — ED Notes (Signed)
Dr J and shovon NP into see 
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
## 2015-11-13 NOTE — ED Notes (Signed)
Up tot he bathroom to shower and change scrubs 

## 2015-11-13 NOTE — ED Notes (Signed)
Pt. C/o upset stomach.

## 2015-11-13 NOTE — ED Notes (Signed)
Pt. Noted in hall. No complaints or concerns voiced. No distress or abnormal behavior noted. Will continue to monitor with security cameras. Q 15 minute rounds continue. 

## 2015-11-13 NOTE — Progress Notes (Signed)
Patient continually cursing loudly, difficult to redirect behaviors. Pt tearful at times and demanding to "watch CNN now motherfuckers! I'm with [employer]! You have no fucking clue what I know!!". Patient pacing hallway. PRN Ativan given as ordered.

## 2015-11-13 NOTE — ED Notes (Signed)
Up talking w/ other pt in the hall

## 2015-11-13 NOTE — Progress Notes (Signed)
[name] is a 25 year old female admitted to [facility] .  She was brought in under IVC  with diagnosis of Schizoaffective disorder.   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  She talks to people that aren't there.  Told her mother that "I should just kill myself."  She reports that she cannot take psychotropic medications because her doctors (Dr. Marlyne BeardsJennings and Dr. West CarboMackinson) will not allow her to take them and she will refuse to take them here.  She denies any SI/HI at this time.  She was very agitated and by end of the admission she didn't want to talk anymore.  Skin search completed and no skin issues noted.  Oriented her to the unit and admission paperwork completed.  Belongings searched and secured in locker #35 (2 makeup bags, Marlboro cigarettes, black lighter, black coat, beige bag with contents, multicolored wallet, red shoe strings and urine cup with jewelry).  Q 15 minute checks initiated for safety.  We will monitor the progress towards her goals.

## 2015-11-13 NOTE — ED Notes (Signed)
[facility] unable to take report at this, call back in 15 mins

## 2015-11-13 NOTE — ED Notes (Signed)
Up to the phone 

## 2015-11-13 NOTE — [facility] Counselor (Signed)
Patient accepted to 506-2 per Donnald GarreShaletta Forrest, RN, [facility]. Patient is under IVC. Patients nurse informed of acceptance. Call nurse report to [phone].  Davina PokeJoVea Zaylyn Bergdoll, LCSW Therapeutic Triage Specialist [facility] 11/13/2015 12:20 PM

## 2015-11-13 NOTE — ED Notes (Signed)
Pt sitting on bed eating snack. Pt reports that she has been waking up sweating.  No fever noted, pt denies drug use.  Pt reports that this happened when she was in college also.  NAD, will continue to monitor.

## 2015-11-13 NOTE — ED Notes (Signed)
TTS into see 

## 2015-11-14 DIAGNOSIS — F22 Delusional disorders: Secondary | ICD-10-CM

## 2015-11-14 MED ORDER — HALOPERIDOL 5 MG PO TABS: 5.0000 mg | ORAL_TABLET | Freq: Three times a day (TID) | ORAL | Status: DC | PRN

## 2015-11-14 MED ORDER — HALOPERIDOL LACTATE 5 MG/ML IJ SOLN: 5.0000 mg | Freq: Three times a day (TID) | INTRAMUSCULAR | Status: DC | PRN

## 2015-11-14 MED ORDER — HYDROXYZINE HCL 25 MG PO TABS
ORAL_TABLET | ORAL | Status: AC
  Filled 2015-11-14: qty 3

## 2015-11-14 MED ORDER — HYDROXYZINE HCL 50 MG PO TABS
75.0000 mg | ORAL_TABLET | Freq: Once | ORAL | Status: AC
  Administered 2015-11-14: 75 mg via ORAL

## 2015-11-14 MED ORDER — HYDROXYZINE HCL 50 MG PO TABS
75.0000 mg | ORAL_TABLET | Freq: Every day | ORAL | Status: DC
  Administered 2015-11-15 – 2015-11-18 (×4): 75 mg via ORAL
  Filled 2015-11-14 (×6): qty 1

## 2015-11-14 NOTE — [facility] Suicide Risk Assessment (Signed)
[facility]   Nursing information obtained from:    Demographic factors:    Current Mental Status:    Loss Factors:    Historical Factors:    Risk Reduction Factors:     Total Time spent with patient: 45 minutes Principal Problem: Delusional disorder (HCC) Diagnosis:   Patient Active Problem List   Diagnosis Date Noted  . Delusional disorder (HCC) [F22] 11/13/2015  . 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 Cannabis dependence, continuous abuse (HCC) [F12.20] 10/10/2015  . Medical clearance for psychiatric admission [Z00.8]    Subjective Data: Patient is 25 year old Caucasian female who is admitted due to psychotic episode.  She is very labile, agitated, grandiose and having delusions.  She believe that she worked for [employer].  She has a history of multiple hospitalization.  Currently she is not taking any antipsychotic medication.  Please see history and physical for more details.  Continued Clinical Symptoms:  Alcohol Use Disorder Identification Test Final Score (AUDIT): 1 The "Alcohol Use Disorders Identification Test", Guidelines for Use in Primary Care, Second Edition.  World Science writerHealth Organization [facility](WHO). Score between 0-7:  no or low risk or alcohol related problems. Score between 8-15:  moderate risk of alcohol related problems. Score between 16-19:  high risk of alcohol related problems. Score 20 or above:  warrants further diagnostic evaluation for alcohol dependence and treatment.   CLINICAL FACTORS:   Schizophrenia:   Less than 26 years old Paranoid or undifferentiated type   Musculoskeletal: Strength & Muscle Tone: within normal limits Gait & Station: normal Patient leans: N/A  Psychiatric Specialty Exam: Please see history and physical for complete mental status examination. ROS  Blood pressure 102/73, pulse 102, temperature 98 F (36.7 C), temperature source Oral, resp. rate  16, height 5\' 3"  (1.6 m), weight 54.432 kg (120 lb), SpO2 98 %.Body mass index is 21.26 kg/(m^2).  ADL's:  Intact    COGNITIVE FEATURES THAT CONTRIBUTE TO RISK:  Closed-mindedness, Loss of executive function, Polarized thinking and Thought constriction (tunnel vision)    SUICIDE RISK:   Minimal: No identifiable suicidal ideation.  Patients presenting with no risk factors but with morbid ruminations; may be classified as minimal risk based on the severity of the depressive symptoms  PLAN OF CARE: Patient is 25 year old Caucasian female who was admitted due to psychosis and delusions.  Patient requires inpatient treatment and stabilization.  Please see history and physical for more details and comprehensive equipment.  I certify that inpatient services furnished can reasonably be expected to improve the patient's condition.   Aldahir Litaker T., MD 11/14/2015, 1:55 PM

## 2015-11-14 NOTE — [facility] Group Notes (Signed)
[facility] Group Notes: (Clinical Social Work)   11/14/2015      Type of Therapy:  Group Therapy (Music)  Participation Level:  Did Not Attend despite MHT prompting - Pt was in the group room at the beginning of group but left almost immediately and could be heard screaming at the nursing/tech staff.  She then came into the group room, but left again very quickly.  She did this quick "in and out" several times, appeared angry each time and did not remain long enough to even hear a song play.   Ambrose MantleMareida Grossman-Orr, LCSW 11/14/2015, 1:17 PM

## 2015-11-14 NOTE — Progress Notes (Signed)
D: Pt denies SI/HI/AVH. Pt is irritable, tangential , very labile. Pt stated she was having stomach issues and no one helped her for 2 hours, even though writer told pt to come if there were any more issues .   A: Pt was offered support and encouragement. Pt was given scheduled medications. Pt was encourage to attend groups. Q 15 minute checks were done for safety.   R: Pt is taking medication.

## 2015-11-14 NOTE — B[facility] Counselor (Signed)
CSW unable to assess today due to irritability, disruptive on the unit and forced meds.  Weekday CSW to attempt.    Jeanelle MallingChelsea Warnie Belair, [place]CS[place] 11/14/2015  1:12 PM

## 2015-11-14 NOTE — Plan of Care (Signed)
Problem: Ineffective individual coping Goal: STG: Patient will remain free from self harm Outcome: Progressing Pt safe on the unit at this time     

## 2015-11-14 NOTE — Progress Notes (Signed)
Patient ID: [name], female   DOB: 10/06/1990, 25 y.o.   MRN: [id number]   D: Pt has been very agitated and irritable on the unit today. Pt has also been very belligerent, and has required frequent redirection. Pt was seen by the doctor, she became very upset and started cursing staff out and throwing staff on the unit. Pt reported that the doctor was trying to give her medication that was going to kill her. Pt reported that she did not need the medication that he was trying to give and that she only needed her morning dose of Vistaril. This Clinical research associatewriter spoke to doctor Arfeen, orders noted for Vistaril. Pt was given dose of Vistaril, no other issues noted. Pt reported that her depression was a 0, her hopelessness was a 0, and that her anxiety was a 0.   Pt reported being negative SI/HI, no AH/VH noted. A: 15 min checks continued for patient safety. R: Pt safety maintained.

## 2015-11-14 NOTE — H&P (Signed)
Psychiatric Admission Assessment Adult  Patient Identification: Stacy Poole MRN:  [id number] Date of Evaluation:  11/14/2015 Chief Complaint:  SCHIZOAFFECTIVE DISORDER Principal Diagnosis: Delusional disorder (HCC) Diagnosis:   Patient Active Problem List   Diagnosis Date Noted  . Delusional disorder (HCC) [F22] 11/13/2015  . 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 Cannabis dependence, continuous abuse (HCC) [F12.20] 10/10/2015  . Medical clearance for psychiatric admission [Z00.8]    History of Present Illness:: Patient is 25 year old Caucasian female who is admitted due to psychotic behavior.  Patient told her mother called police because she did not let me work in [place].  She told that she worked for [employer] and she will not disclose her identity.  Patient appears very labile, grandiose, agitated, irritable and starts screaming and yelling.  She mentioned that she has [employer] all over the [place] to kill [employer].  She stopped using profanity against her mother who put her in the hospital.  Patient has multiple psychiatric hospitalization.  She has been admitted to old [address]  She was uncooperative to provide more information but endorsed that antipsychotic medication makes her more suicidal.  She has given Haldol in the past but she refused to take it because it makes her suicidal.  During conversation patient got very angry and does not want to answer the question.  She left the room and starts screaming in the hallway.  She was asked to take when necessary medication including Haldol to calm herself but patient told that she will take only Vistaril.  She was given Vistaril 75 mg which help her agitation.  Patient is very grandiose, easily distracted, tangential and disorganized.  We also offered to take Depakote but she refuses.  We will get second opinion if patient needs forced medication.   Her UDS is positive for benzodiazepine however one month ago she was positive for cannabis.  Her pregnancy test is negative.  Associated Signs/Symptoms: Depression Symptoms:  insomnia, psychomotor agitation, disturbed sleep, (Hypo) Manic Symptoms:  Delusions, Distractibility, Elevated Mood, Flight of Ideas, Grandiosity, Impulsivity, Irritable Mood, Labiality of Mood, Anxiety Symptoms:  Excessive Worry, Psychotic Symptoms:  Delusions, Paranoia, PTSD Symptoms: Unknown as patient refused to talk. Total Time spent with patient: 45 minutes  Past Psychiatric History: Patient has multiple hospitalization in the past.  She has been admitted at [facility], [facility] and [facility]  She has also attended [facility] OP programming in 2013.  Is the patient at risk to self? Yes.    Has the patient been a risk to self in the past 6 months? No.  Has the patient been a risk to self within the distant past? Yes.    Is the patient a risk to others? Yes.    Has the patient been a risk to others in the past 6 months? No.  Has the patient been a risk to others within the distant past? No.   Prior Inpatient Therapy:   Prior Outpatient Therapy:    Alcohol Screening: Patient refused Alcohol Screening Tool: Yes 1. How often do you have a drink containing alcohol?: Monthly or less 2. How many drinks containing alcohol do you have on a typical day when you are drinking?: 1 or 2 3. How often do you have six or more drinks on one occasion?: Never Preliminary Score: 0 9. Have you or someone else been injured as a result of your drinking?: No  10. Has a relative or friend or a doctor or another health worker been concerned about your drinking or suggested you cut down?: No Alcohol Use Disorder Identification Test Final Score (AUDIT): 1 Brief Intervention: AUDIT score less than 7 or less-screening does not suggest unhealthy drinking-brief intervention not indicated Substance  Abuse History in the last 12 months:  Yes.   Consequences of Substance Abuse: NA Previous Psychotropic Medications: Yes  Psychological Evaluations: No  Past Medical History:  Past Medical History  Diagnosis Date  . Anxiety   . Substance abuse   . Acute ear infection   . Mononucleosis   . Fifth disease   . Mental disorder   . Arachnoid cyst    History reviewed. No pertinent past surgical history. Family History: History reviewed. No pertinent family history. Family Psychiatric  History: Unknown Tobacco Screening: @FLOW ([phone])::1)@ Social History:  History  Alcohol Use No    Comment: daily     History  Drug Use No    Comment: narcotics last used 09/25/11 0100,     Additional Social History:      Pain Medications: See PTA Prescriptions: See PTA Over the Counter: See PTA History of alcohol / drug use?: No history of alcohol / drug abuse                    Allergies:   Allergies  Allergen Reactions  . Tetracyclines & Related Other (See Comments)    Ear popping, couldn't move neck/back, and had blind spots/double vision   Lab Results:  Results for orders placed or performed during the hospital encounter of 11/12/15 (from the past 48 hour(s))  I-Stat beta hCG blood, ED ([facility], [facility], [facility] only)     Status: None   Collection Time: 11/12/15  1:47 PM  Result Value Ref Range   I-stat hCG, quantitative <5.0 <5 mIU/mL   Comment 3            Comment:   GEST. AGE      CONC.  (mIU/mL)   <=1 WEEK        5 - 50     2 WEEKS       50 - 500     3 WEEKS       100 - 10,000     4 WEEKS     1,000 - 30,000        FEMALE AND NON-PREGNANT FEMALE:     LESS THAN 5 mIU/mL   Urine rapid drug screen (hosp performed) (Not at [facility])     Status: Abnormal   Collection Time: 11/13/15  6:39 AM  Result Value Ref Range   Opiates NONE DETECTED NONE DETECTED   Cocaine NONE DETECTED NONE DETECTED   Benzodiazepines POSITIVE (A) NONE DETECTED   Amphetamines NONE DETECTED NONE DETECTED    Tetrahydrocannabinol NONE DETECTED NONE DETECTED   Barbiturates NONE DETECTED NONE DETECTED    Comment:        DRUG SCREEN FOR MEDICAL PURPOSES ONLY.  IF CONFIRMATION IS NEEDED FOR ANY PURPOSE, NOTIFY LAB WITHIN 5 DAYS.        LOWEST DETECTABLE LIMITS FOR URINE DRUG SCREEN Drug Class       Cutoff (ng/mL) Amphetamine      1000 Barbiturate      200 Benzodiazepine   200 Tricyclics       300 Opiates          300 Cocaine          300 THC  50     Blood Alcohol level:  Lab Results  Component Value Date   ETH <5 11/12/2015   ETH <5 10/09/2015    Metabolic Disorder Labs:  No results found for: HGBA1C, MPG No results found for: PROLACTIN No results found for: CHOL, TRIG, HDL, CHOLHDL, VLDL, LDLCALC  Current Medications: Current Facility-Administered Medications  Medication Dose Route Frequency Provider Last Rate Last Dose  . acetaminophen (TYLENOL) tablet 650 mg  650 mg Oral Q6H PRN Shuvon B Rankin, NP      . alum & mag hydroxide-simeth (MAALOX/MYLANTA) 200-200-20 MG/5ML suspension 30 mL  30 mL Oral Q4H PRN Shuvon B Rankin, NP   30 mL at 11/13/15 2155  . haloperidol (HALDOL) tablet 5 mg  5 mg Oral Q8H PRN Cleotis Nipper, MD       Or  . haloperidol lactate (HALDOL) injection 5 mg  5 mg Intramuscular Q8H PRN Cleotis Nipper, MD      . hydrOXYzine (ATARAX/VISTARIL) tablet 75 mg  75 mg Oral QHS Thermon Leyland, NP   75 mg at 11/13/15 2156  . LORazepam (ATIVAN) tablet 0.5 mg  0.5 mg Oral Q6H PRN Thermon Leyland, NP   0.5 mg at 11/14/15 0951  . magnesium hydroxide (MILK OF MAGNESIA) suspension 30 mL  30 mL Oral Daily PRN Shuvon B Rankin, NP       PTA Medications: Prescriptions prior to admission  Medication Sig Dispense Refill Last Dose  . hydrOXYzine (VISTARIL) 25 MG capsule Take 1 capsule in the morning, at lunch, afternoon and Take 3 capsules qhs  0 11/14/2015  . traZODone (DESYREL) 25 mg TABS tablet Take 25 mg by mouth at bedtime as needed for sleep.    11/14/2015     Musculoskeletal: Strength & Muscle Tone: within normal limits Gait & Station: normal Patient leans: N/A  Psychiatric Specialty Exam: Physical Exam  Review of Systems  HENT: Negative for hearing loss.   Neurological: Positive for dizziness. Negative for tremors and headaches.  Psychiatric/Behavioral: The patient is nervous/anxious and has insomnia.     Blood pressure 102/73, pulse 102, temperature 98 F (36.7 C), temperature source Oral, resp. rate 16, height 5\' 3"  (1.6 m), weight 54.432 kg (120 lb), SpO2 98 %.Body mass index is 21.26 kg/(m^2).  General Appearance: Bizarre and Guarded  Eye Contact::  Minimal  Speech:  Pressured  Volume:  Increased  Mood:  Angry and Irritable  Affect:  Inappropriate and Labile  Thought Process:  Circumstantial, Disorganized, Irrelevant, Loose and Tangential  Orientation:  Full (Time, Place, and Person)  Thought Content:  Delusions  Suicidal Thoughts:  No  Homicidal Thoughts:  No  Memory:  Immediate;   Fair Recent;   Fair Remote;   Fair  Judgement:  Impaired  Insight:  Lacking  Psychomotor Activity:  Increased and Restlessness  Concentration:  Fair  Recall:  [employer] of Knowledge:Good  Language: Fair  Akathisia:  No  Handed:  Right  AIMS (if indicated):     Assets:  Communication Skills Housing  ADL's:  Intact  Cognition: WNL  Sleep:        Treatment Plan Summary: Daily contact with patient to assess and evaluate symptoms and progress in treatment  Admit for crisis management and stabilization. Medication management to reduce symptoms to baseline and improved the patient's overall level of functioning.  At this time patient refusing antipsychotic medication and only wants to take Vistaril and trazodone.  She was offered Depakote but she does not  want to take Depakote.  We will get second opinion to see if patient requires forced medication to help her illness and symptoms.  Closely monitor the side effects, efficacy and  therapeutic response of medication. Treat health problem as indicated. Developed treatment plan to decrease the risk of relapse upon discharge and to reduce the need for readmission. Psychosocial education regarding relapse prevention in self-care. Healthcare followup as needed for medical problems and called consults as indicated.   Increase collateral information. Restart home medication where appropriate Encouraged to participate and verbalize into group milieu therapy.   Observation Level/Precautions:  15 minute checks  Laboratory:  CBC Chemistry Profile Folic Acid GGT HbAIC HCG UDS UA  Psychotherapy:    Medications:    Consultations:    Discharge Concerns:    Estimated LOS:  Other:     I certify that inpatient services furnished can reasonably be expected to improve the patient's condition.    Duriel Deery T., MD 3/12/20171:42 PM

## 2015-11-14 NOTE — Progress Notes (Signed)
D: Pt denies SI/HI/AVH. Pt is delusional " I didn't come here with any problems".   A: Pt was offered support and encouragement. Pt was given scheduled medications. Pt was encourage to attend groups. Q 15 minute checks were done for safety.  R: Pt is taking medication. Pt receptive to treatment and safety maintained on unit.

## 2015-11-14 NOTE — Progress Notes (Signed)
Psychoeducational Group Note  Date:  11/14/2015 Time:  2127  Group Topic/Focus:  Wrap-Up Group:   The focus of this group is to help patients review their daily goal of treatment and discuss progress on daily workbooks.  Participation Level: Did Not Attend  Participation Quality:  Not Applicable  Affect:  Not Applicable  Cognitive:  Not Applicable  Insight:  Not Applicable  Engagement in Group: Not Applicable  Additional Comments:  The patient did not attend group since she was asleep in her bedroom.   Hazle CocaGOODMAN, Shauntia Levengood S 11/14/2015, 9:27 PM

## 2015-11-15 DIAGNOSIS — F1220 Cannabis dependence, uncomplicated: Secondary | ICD-10-CM | POA: Diagnosis present

## 2015-11-15 DIAGNOSIS — F1020 Alcohol dependence, uncomplicated: Secondary | ICD-10-CM

## 2015-11-15 DIAGNOSIS — F1994 Other psychoactive substance use, unspecified with psychoactive substance-induced mood disorder: Secondary | ICD-10-CM | POA: Diagnosis present

## 2015-11-15 DIAGNOSIS — F19929 Other psychoactive substance use, unspecified with intoxication, unspecified: Secondary | ICD-10-CM
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
## 2015-11-15 NOTE — Tx Team (Signed)
Interdisciplinary Treatment Plan Update (Adult)  Date:  11/15/2015   Time Reviewed:  9:28 AM   Progress in Treatment: Attending groups: Yes. Participating in groups:  Yes. Taking medication as prescribed:  Yes. Tolerating medication:  Yes. Family/Significant other contact made:  Yes Patient understands diagnosis:  No  Limited insight Discussing patient identified problems/goals with staff:  Yes, see initial care plan. Medical problems stabilized or resolved:  Yes. Denies suicidal/homicidal ideation: Yes. Issues/concerns per patient self-inventory:  No. Other:  New problem(s) identified:  Discharge Plan or Barriers: see below  Reason for Continuation of Hospitalization: Delusions  Medication stabilization  Comments:  Stacy Poole is a 25 year old CF , who is single, unemployed , who has a hx of mood sx/psychosis as well as alcohol use disorder , who presented IVCed for delusional/bizarre behavior. Pt today continues to be resistant to take any other medications. Continues to talk about delusional topics like [employer] and saving the world , but does not seem too preoccupied with it.  Will continue Vistaril 75 mg po bis for sleep/anxiety sx. Will continue PRN medications as per agitation protocol. Will continue to educate pt , encourage and support.  Estimated length of stay: 4-5 days  New goal(s):  Review of initial/current patient goals per problem list:   Review of initial/current patient goals per problem list:  1. Goal(s): Patient will participate in aftercare plan   Met: Yes   Target date: 3-5 days post admission date   As evidenced by: Patient will participate within aftercare plan AEB aftercare provider and housing plan at discharge being identified. 11/16/15:  Return home, follow up outpt   5. Goal(s): Patient will demonstrate decreased signs of psychosis  * Met: No  * Target date: 3-5 days post admission date  * As evidenced by: Patient will demonstrate  decreased frequency of AVH or return to baseline function 11/16/15:  Pt refusing antipsychotics, is delusional          Attendees: Patient:  11/15/2015 9:28 AM   Family:   11/15/2015 9:28 AM   Physician:  Ursula Alert, MD 11/15/2015 9:28 AM   Nursing:   Manuella Ghazi, RN 11/15/2015 9:28 AM   CSW:    Roque Lias, LCSW   11/15/2015 9:28 AM   Other:  11/15/2015 9:28 AM   Other:   11/15/2015 9:28 AM   Other:  Lars Pinks, Nurse CM 11/15/2015 9:28 AM   Other:   11/15/2015 9:28 AM   Other:  Norberto Sorenson, [place]  11/15/2015 9:28 AM   Other:  11/15/2015 9:28 AM   Other:  11/15/2015 9:28 AM   Other:  11/15/2015 9:28 AM   Other:  11/15/2015 9:28 AM   Other:  11/15/2015 9:28 AM   Other:   11/15/2015 9:28 AM    Scribe for Treatment Team:   Trish Mage, 11/15/2015 9:28 AM

## 2015-11-15 NOTE — Progress Notes (Signed)
Stacy Poole has been visible on the unit.  She attends groups.  She is noted to be sitting in the day room cursing and talking about how she doesn't need to be here.  She denies any SI/HI or A/V hallucinations.  She denies any pain or discomfort and appears to be in no physical distress.  PRN for anxiety given earlier with good relief.  She completed her self inventory and reports that her depression, hopelessness and anxiety are 0/10.  Her goal for today was "exit plan and strategy" and she will accomplish this goal by "listen and share."  Encouraged continued participation in group and unit activities.  Q 15 minute checks maintained for safety.  We will continue to monitor the progress towards her goals.

## 2015-11-15 NOTE — Progress Notes (Signed)
D: Pt denies SI/HI/AVH. Pt continues to argumentative, but pt is redirectable.   A: Pt was offered support and encouragement. Pt was given scheduled medications. Pt was encourage to attend groups. Q 15 minute checks were done for safety.   R:Pt attends groups and interacts well with peers and staff. Pt is taking medication. Pt receptive to treatment and safety maintained on unit.

## 2015-11-15 NOTE — Plan of Care (Signed)
Problem: Ineffective individual coping Goal: STG: Patient will remain free from self harm Outcome: Progressing Pt safe on the unit at this time     

## 2015-11-15 NOTE — Progress Notes (Signed)
Adult Psychoeducational Group Note  Date:  11/15/2015 Time:  8:49 PM  Group Topic/Focus:  Wrap-Up Group:   The focus of this group is to help patients review their daily goal of treatment and discuss progress on daily workbooks.  Participation Level:  Active  Participation Quality:  Attentive  Affect:  Appropriate  Cognitive:  Appropriate  Insight: Appropriate and Good  Engagement in Group:  Engaged  Modes of Intervention:  Discussion  Additional Comments:  Pt rated her day a 6 out of 10. When asked what made her day a 6, pt responded "it sounds good". Pt goal for tomorrow is to talk with the doctor to figure out when she goes home.  Merlinda FrederickKeshia S Caresse Sedivy 11/15/2015, 8:49 PM
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
## 2015-11-16 MED ORDER — ZIPRASIDONE MESYLATE 20 MG IM SOLR: 10.0000 mg | Freq: Two times a day (BID) | INTRAMUSCULAR | Status: DC | PRN

## 2015-11-16 MED ORDER — LORAZEPAM 2 MG/ML IJ SOLN: 1.0000 mg | Freq: Four times a day (QID) | INTRAMUSCULAR | Status: DC | PRN

## 2015-11-16 MED ORDER — ZIPRASIDONE HCL 20 MG PO CAPS: 20.0000 mg | ORAL_CAPSULE | Freq: Two times a day (BID) | ORAL | Status: DC | PRN

## 2015-11-16 MED ORDER — LORAZEPAM 1 MG PO TABS
1.0000 mg | ORAL_TABLET | Freq: Four times a day (QID) | ORAL | Status: DC | PRN
  Administered 2015-11-16 – 2015-11-18 (×4): 1 mg via ORAL
  Filled 2015-11-16 (×4): qty 1
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
## 2015-11-16 NOTE — [facility] Group Notes (Signed)
[facility] LCSW Group Therapy  11/16/2015 , 3:11 PM   Type of Therapy:  Group Therapy  Participation Level:  Active  Participation Quality:  Attentive  Affect:  Appropriate  Cognitive:  Alert  Insight:  Improving  Engagement in Therapy:  Engaged  Modes of Intervention:  Discussion, Exploration and Socialization  Summary of Progress/Problems: Today's group focused on the term Diagnosis.  Participants were asked to define the term, and then pronounce whether it is a negative, positive or neutral term.  No participation today.  Pt was still angry from earlier interchange with Dr in which she thought Dr was trying to force meds that she had previously taken and had a bad reaction to. Eventually left.  Daryel Geraldorth, Stacy Poole B 11/16/2015 , 3:11 PM

## 2015-11-16 NOTE — Progress Notes (Signed)
Nursing Shift Note:  D Patient very labile in mood and is frequently outbursting loudly but not usually direected at any one or anything in particular; mostly is accusatory of imagined paranoia type beliefs. Patient is compliiant with medications and denies any SI/HI/AVH as well as denies any depression but does state anxiety at level 3. A Nurse administering meds as ordered, providing redirection and calm emotional support and communication. Nurse ensuring q15 minute checks done and documented for safety. R Patient remains safe on the Unit.

## 2015-11-17 DIAGNOSIS — F1994 Other psychoactive substance use, unspecified with psychoactive substance-induced mood disorder: Secondary | ICD-10-CM
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
## 2015-11-17 NOTE — Plan of Care (Signed)
Problem: Alteration in mood Goal: LTG-Patient reports reduction in suicidal thoughts (Patient reports reduction in suicidal thoughts and is able to verbalize a safety plan for whenever patient is feeling suicidal)  Outcome: Progressing Pt denies SI and verbally contracts for safety.       

## 2015-11-17 NOTE — BHH Group Notes (Signed)
[facility] Note   11/17/2015 10:52 AM  Participation Quality:  Engaged  Mood/Affect:  Labile  Depression Rating:    Anxiety Rating:    Thoughts of Suicide:  No Will you contract for safety?   NA  Current AVH:  denies  Plan for Discharge/Comments:  Became tearful when talking about how she feels she has been mean spirited to others, and consequently has reaped what she has sown.  She was referring to her time here with us.  Her roomates both assured her that they had not taken it personally.  She requested to see a particular Dr today since Dr Elna BreslowEappen is not here today.  Transportation Means:   Supports:  Stacy Poole, Stacy Poole

## 2015-11-17 NOTE — Progress Notes (Signed)
D: Patient denies SI/HI or AVH.  She has a flat affect and labile mood.  She forwards little but is otherwise calm and cooperative.  She states that she slept well but did not get up for breakfast this morning.   A: Patient given emotional support from RN. Patient encouraged to come to staff with concerns and/or questions. Patient's medication routine continued. Patient's orders and plan of care reviewed.   R: Patient remains appropriate and cooperative. Will continue to monitor patient q15 minutes for safety.

## 2015-11-17 NOTE — Progress Notes (Signed)
Adult Psychoeducational Group Note  Date:  11/17/2015 Time:  10:17 PM  Group Topic/Focus:  Wrap-Up Group:   The focus of this group is to help patients review their daily goal of treatment and discuss progress on daily workbooks.  Participation Level:  Active  Participation Quality:  Attentive  Affect:  Appropriate  Cognitive:  Appropriate  Insight: Appropriate  Engagement in Group:  Engaged  Modes of Intervention:  Discussion  Additional Comments:  Pt had a good day and been reading a book that's been inspiring her. Pt goal for tomorrow is to participate more and get a discharge date.   Merlinda FrederickKeshia S Chari Parmenter 11/17/2015, 10:17 PM

## 2015-11-17 NOTE — Progress Notes (Signed)
Pt did not attend wrap up group this evening.  

## 2015-11-17 NOTE — Progress Notes (Signed)
D: Pt has depressed affect and mood.  She was in bed in her room upon initial approach.  Pt forwards little information to Probation officer.  She reports her day was "good" and that her goal is to "get out."  Pt denies SI/HI, denies hallucinations, denies pain.  Pt has stayed in her room for the majority of the night and she did not attend evening group.   A: Introduced self to pt.  Met with pt and offered support and encouragement.  Actively listened to pt.  Encouraged pt to discuss potential discharge date and aftercare plan with treatment team tomorrow.  Medication administered per order.  R: Pt is compliant with medication.  Pt verbally contracts for safety and reports that she will inform staff of needs and concerns.  Will continue to monitor and assess.

## 2015-11-17 NOTE — BHH Group Notes (Signed)
B[facility] LCSW Group Therapy Group Therapy:  [facility]  1:15 - 2: 30 PM   Date:  11/17/2015 Time:  2:14 PM   Type of Therapy: Group Therapy  Participation Level: Invited, chose not to attend.  Participation Quality: Santa GeneraAnne Nichelle Renwick, LCSW Lead Clinical Social Worker Phone:  [phone]

## 2015-11-18 MED ORDER — HYDROXYZINE HCL 25 MG PO TABS: ORAL_TABLET | ORAL | Status: DC
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
## 2015-11-18 NOTE — Progress Notes (Signed)
Patient discharged home with prescription. Patient was stable and appreciative at that time. All papers and prescriptions were given and valuables returned. Verbal understanding expressed. Denies SI/HI and A/VH. Patient given opportunity to express concerns and ask questions.  

## 2015-11-18 NOTE — Plan of Care (Signed)
Problem: Alteration in thought process Goal: STG-Patient is able to sleep at least 6 hours per night Outcome: Progressing Pt slept over 6 hours last night according to flowsheet.       

## 2015-11-18 NOTE — BHH Suicide Risk Assessment (Signed)
BHH INPATIENT:  Family/Significant Other Suicide Prevention Education  Suicide Prevention Education:  Education Completed; No one has been identified by the patient as the family member/significant other with whom the patient will be residing, and identified as the person(s) who will aid the patient in the event of a mental health crisis (suicidal ideations/suicide attempt).  With written consent from the patient, the family member/significant other has been provided the following suicide prevention education, prior to the and/or following the discharge of the patient.  The suicide prevention education provided includes the following:  Suicide risk factors  Suicide prevention and interventions  National Suicide Hotline telephone number  [facility] assessment telephone number  [facility] Emergency Assistance 911  [facility] and/or Residential Mobile Crisis Unit telephone number  Request made of family/significant other to:  Remove weapons (e.g., guns, rifles, knives), all items previously/currently identified as safety concern.    Remove drugs/medications (over-the-counter, prescriptions, illicit drugs), all items previously/currently identified as a safety concern.  The family member/significant other verbalizes understanding of the suicide prevention education information provided.  The family member/significant other agrees to remove the items of safety concern listed above. The patient did not endorse SI at the time of admission, nor did the patient c/o SI during the stay here.  SPE not required. However, I did talk to mother, Maple HudsonCarol Derocher, [phone], about a crises plan.  Daryel Geraldorth, Nakesha Ebrahim B 11/18/2015, 10:34 AM

## 2015-11-18 NOTE — Progress Notes (Signed)
  [facility] :  Will you be returning to the same living situation after discharge:  Yes,  home At discharge, do you have transportation home?: Yes,  mother Do you have the ability to pay for your medications: Yes,  insurance  Release of information consent forms completed and in the chart;  Patient's signature needed at discharge.  Patient to Follow up at: Follow-up Information    Follow up with [facility] On 11/22/2015.   Why:  Monday at [facility] with Dr Lesly DukesJennings   Contact information:   [address]  [place] [336] [phone]      Follow up with [facility].   Why:  You will need to call to schedule an appointment.   Contact information:   [address]  [place] [336] [phone]      Next level of care provider has access to [facility] Link:no  Safety Planning and Suicide Prevention discussed: Yes,  yes  Have you used any form of tobacco in the last 30 days? (Cigarettes, Smokeless Tobacco, Cigars, and/or Pipes): Yes  Has patient been referred to the Quitline?: Patient refused referral  Patient has been referred for addiction treatment: Yes  Stacy Poole, Stacy Poole 11/18/2015, 10:30 AM

## 2015-11-18 NOTE — Tx Team (Signed)
Interdisciplinary Treatment Plan Update (Adult)  Date:  11/18/2015   Time Reviewed:  8:53 AM   Progress in Treatment: Attending groups: Yes. Participating in groups:  Yes. Taking medication as prescribed:  Yes. Tolerating medication:  Yes. Family/Significant other contact made:  Yes Patient understands diagnosis:  No  Limited insight Discussing patient identified problems/goals with staff:  Yes, see initial care plan. Medical problems stabilized or resolved:  Yes. Denies suicidal/homicidal ideation: Yes. Issues/concerns per patient self-inventory:  No. Other:  New problem(s) identified:  Discharge Plan or Barriers: see below  Reason for Continuation of Hospitalization:   Comments:  Stacy Poole is a 25 year old CF , who is single, unemployed , who has a hx of mood sx/psychosis as well as alcohol use disorder , who presented IVCed for delusional/bizarre behavior. Pt today continues to be resistant to take any other medications. Continues to talk about delusional topics like ISIS and saving the world , but does not seem too preoccupied with it.  Will continue Vistaril 75 mg po bis for sleep/anxiety sx. Will continue PRN medications as per agitation protocol. Will continue to educate pt , encourage and support.  Estimated length of stay: D/C today  New goal(s):  Review of initial/current patient goals per problem list:   Review of initial/current patient goals per problem list:  1. Goal(s): Patient will participate in aftercare plan   Met: Yes   Target date: 3-5 days post admission date   As evidenced by: Patient will participate within aftercare plan AEB aftercare provider and housing plan at discharge being identified. 11/16/15:  Return home, follow up outpt   5. Goal(s): Patient will demonstrate decreased signs of psychosis  * Met: No  * Target date: 3-5 days post admission date  * As evidenced by: Patient will demonstrate decreased frequency of AVH or return to  baseline function 11/16/15:  Pt refusing antipsychotics, is delusional 11/18/15:  Pt continues to refuse, continues with delusions, mother is OK with d/c          Attendees: Patient:  11/18/2015 8:53 AM   Family:   11/18/2015 8:53 AM   Physician:  Ursula Alert, MD 11/18/2015 8:53 AM   Nursing:   Lytle Michaels, RN 11/18/2015 8:53 AM   CSW:    Roque Lias, LCSW   11/18/2015 8:53 AM   Other:  11/18/2015 8:53 AM   Other:   11/18/2015 8:53 AM   Other:  Lars Pinks, Nurse CM 11/18/2015 8:53 AM   Other:   11/18/2015 8:53 AM   Other:  Norberto Sorenson, [place]  11/18/2015 8:53 AM   Other:  11/18/2015 8:53 AM   Other:  11/18/2015 8:53 AM   Other:  11/18/2015 8:53 AM   Other:  11/18/2015 8:53 AM   Other:  11/18/2015 8:53 AM   Other:   11/18/2015 8:53 AM    Scribe for Treatment Team:   Trish Mage, 11/18/2015 8:53 AM

## 2015-11-18 NOTE — Progress Notes (Signed)
D: Pt presents with appropriate affect and describes her mood as "bored, anxious."  She requested PRN medication for anxiety at the beginning of the shift.  When asked why she is anxious, she states "because I really want a cigarette, I'm starting to get cabin fever."  When asked what her goal for the day was, pt states "it's not about the destination, it's about the journey."  Pt then stated "well, I guess that's not a goal, I want to read a little bit more."  Pt denies SI/HI, denies hallucinations, denies pain.  Pt has been more visible in the milieu tonight compared to last night.  Pt attended evening group. A: Actively listened to pt and offered support and encouragement.  Medications administered per order.  PRN medication administered for anxiety. R: Pt is compliant with medications.  Pt verbally contracts for safety.  Will continue to monitor and assess.

## 2015-11-18 NOTE — [facility] Suicide Risk Assessment (Signed)
[facility]   Principal Problem: Substance or medication-induced bipolar and related disorder with onset during intoxication (HCC) ( alcohol, cannabis) Discharge Diagnoses:  Patient Active Problem List   Diagnosis Date Noted  . Alcohol use disorder, moderate, dependence (HCC) [F10.20] 11/15/2015  . Cannabis use disorder, severe, dependence (HCC) [F12.20] 11/15/2015  . Substance or medication-induced bipolar and related disorder with onset during intoxication [facility](HCC) [F19.94] 11/15/2015    Total Time spent with patient: 30 minutes  Musculoskeletal: Strength & Muscle Tone: within normal limits Gait & Station: normal Patient leans: N/A  Psychiatric Specialty Exam: Review of Systems  Psychiatric/Behavioral: Negative for depression, hallucinations and substance abuse. The patient is not nervous/anxious.   All other systems reviewed and are negative.   Blood pressure 102/73, pulse 102, temperature 98 F (36.7 C), temperature source Oral, resp. rate 16, height 5\' 3"  (1.6 m), weight 54.432 kg (120 lb), SpO2 98 %.Body mass index is 21.26 kg/(m^2).  General Appearance: Casual  Eye Contact::  Fair  Speech:  Clear and Coherent409  Volume:  Normal  Mood:  Anxious  Affect:  Appropriate  Thought Process:  Coherent  Orientation:  Full (Time, Place, and Person)  Thought Content:  WDL  Suicidal Thoughts:  No  Homicidal Thoughts:  No  Memory:  Immediate;   Fair Recent;   Fair Remote;   Fair  Judgement:  Fair  Insight:  Fair  Psychomotor Activity:  Normal  Concentration:  Fair  Recall:  F[employer]Fair  Fund of Knowledge:Fair  Language: Fair  Akathisia:  No  Handed:  Right  AIMS (if indicated):     Assets:  Desire for Improvement  Sleep:  Number of Hours: 6.25  Cognition: WNL  ADL's:  Intact   Mental Status Per Nursing Assessment::   On Admission:     Demographic Factors:  Caucasian  Loss Factors: NA  Historical Factors: Impulsivity  Risk Reduction Factors:    Positive social support  Continued Clinical Symptoms:  Alcohol/Substance Abuse/Dependencies Previous Psychiatric Diagnoses and Treatments  Cognitive Features That Contribute To Risk:  Polarized thinking    Suicide Risk:  Minimal: No identifiable suicidal ideation.  Patients presenting with no risk factors but with morbid ruminations; may be classified as minimal risk based on the severity of the depressive symptoms  Follow-up Information    Follow up with Dr Cletis AthensJon.      Writer spoke with Mother Ruben ImCaroline Escalante at [phone] - who had concerns about Azalea's progress and treatment plan. Needed update about how she is doing. It was discussed with her that [name] has been refusing any new medication initiation at this point. Discussed with her that she does not meet criteria from forced medications. Discussed with her to call 911 or take her to the nearest ED if she is in a crisis.     Plan Of Care/Follow-up recommendations:  Activity:  no restrictions Diet:  regular Tests:  as needed Other:  follow up with after care   Naveh Rickles, MD 11/18/2015, 9:26 AM

## 2016-09-22 ENCOUNTER — Emergency Department (HOSPITAL_COMMUNITY)
Admission: EM | Admit: 2016-09-22 | Discharge: 2016-09-22 | Disposition: A | Discharge status: Home / Self Care | Payer: BLUE CROSS/BLUE SHIELD | Attending: Emergency Medicine | Admitting: Emergency Medicine

## 2016-09-22 ENCOUNTER — Encounter (HOSPITAL_COMMUNITY): Payer: Self-pay | Admitting: *Deleted

## 2016-09-22 ENCOUNTER — Emergency Department (HOSPITAL_COMMUNITY): Payer: BLUE CROSS/BLUE SHIELD

## 2016-09-22 DIAGNOSIS — J111 Influenza due to unidentified influenza virus with other respiratory manifestations: Secondary | ICD-10-CM | POA: Diagnosis not present

## 2016-09-22 DIAGNOSIS — R69 Illness, unspecified: Secondary | ICD-10-CM

## 2016-09-22 DIAGNOSIS — R509 Fever, unspecified: Secondary | ICD-10-CM | POA: Diagnosis present

## 2016-09-22 DIAGNOSIS — F17210 Nicotine dependence, cigarettes, uncomplicated: Secondary | ICD-10-CM | POA: Insufficient documentation

## 2016-09-22 HISTORY — DX: Post-traumatic stress disorder, unspecified: F43.10

## 2016-09-22 HISTORY — DX: Insomnia, unspecified: G47.00

## 2016-09-22 LAB — URINALYSIS, ROUTINE W REFLEX MICROSCOPIC
Bilirubin Urine: NEGATIVE
Glucose, UA: NEGATIVE mg/dL
HGB URINE DIPSTICK: NEGATIVE
Ketones, ur: NEGATIVE mg/dL
Leukocytes, UA: NEGATIVE
Nitrite: NEGATIVE
Protein, ur: NEGATIVE mg/dL
SPECIFIC GRAVITY, URINE: 1.016 (ref 1.005–1.030)
pH: 5 (ref 5.0–8.0)

## 2016-09-22 LAB — COMPREHENSIVE METABOLIC PANEL
ALT: 17 U/L (ref 14–54)
ANION GAP: 8 (ref 5–15)
AST: 19 U/L (ref 15–41)
Albumin: 4 g/dL (ref 3.5–5.0)
Alkaline Phosphatase: 38 U/L (ref 38–126)
BUN: 14 mg/dL (ref 6–20)
CHLORIDE: 106 mmol/L (ref 101–111)
CO2: 24 mmol/L (ref 22–32)
Calcium: 9.4 mg/dL (ref 8.9–10.3)
Creatinine, Ser: 0.65 mg/dL (ref 0.44–1.00)
Glucose, Bld: 100 mg/dL — ABNORMAL HIGH (ref 65–99)
POTASSIUM: 4.1 mmol/L (ref 3.5–5.1)
Sodium: 138 mmol/L (ref 135–145)
Total Bilirubin: 0.7 mg/dL (ref 0.3–1.2)
Total Protein: 6.5 g/dL (ref 6.5–8.1)

## 2016-09-22 LAB — CBC WITH DIFFERENTIAL/PLATELET
Basophils Absolute: 0 10*3/uL (ref 0.0–0.1)
Basophils Relative: 1 %
EOS ABS: 0.3 10*3/uL (ref 0.0–0.7)
Eosinophils Relative: 4 %
HCT: 42.2 % (ref 36.0–46.0)
HEMOGLOBIN: 13.8 g/dL (ref 12.0–15.0)
LYMPHS ABS: 2 10*3/uL (ref 0.7–4.0)
LYMPHS PCT: 31 %
MCH: 28.2 pg (ref 26.0–34.0)
MCHC: 32.7 g/dL (ref 30.0–36.0)
MCV: 86.1 fL (ref 78.0–100.0)
Monocytes Absolute: 0.4 10*3/uL (ref 0.1–1.0)
Monocytes Relative: 6 %
NEUTROS PCT: 58 %
Neutro Abs: 3.9 10*3/uL (ref 1.7–7.7)
Platelets: 241 10*3/uL (ref 150–400)
RBC: 4.9 MIL/uL (ref 3.87–5.11)
RDW: 13.8 % (ref 11.5–15.5)
WBC: 6.6 10*3/uL (ref 4.0–10.5)

## 2016-09-22 LAB — I-STAT BETA HCG BLOOD, ED (MC, WL, AP ONLY): I-stat hCG, quantitative: <5 m[IU]/mL (ref <5)

## 2016-09-22 LAB — I-STAT CG4 LACTIC ACID, ED: LACTIC ACID, VENOUS: 1.07 mmol/L (ref 0.5–1.9)

## 2016-09-22 NOTE — ED Provider Notes (Addendum)
[facility]-EMERGENCY DEPT Provider Note   CSN: [id number] Arrival date & time: 09/22/16  1221     History   Chief Complaint Chief Complaint  Patient presents with  . URI  . Generalized Body Aches    HPI Stacy Poole is a 26 y.o. female.  HPI 26 year old female history of PTSD, anxiety, Presents today complaining of one week of cough, congestion, nausea, and poor by mouth intake with subjective fever. She has taken over-the-counter smoker and has continued to smoke. She is coughing up some yellowish substance. She denies dyspnea. She has not vomited today but does not have much of an appetite. He has not recently taken anti-diuretics.Also complaining of diffuse myalgias. Past Medical History:  Diagnosis Date  . Acute ear infection   . Anxiety   . Arachnoid cyst   . Fifth disease   . Insomnia   . Mental disorder   . Mononucleosis   . PTSD (post-traumatic stress disorder)   . Substance abuse     Patient Active Problem List   Diagnosis Date Noted  . Alcohol use disorder, moderate, dependence (HCC) 11/15/2015  . Cannabis use disorder, severe, dependence (HCC) 11/15/2015  . Substance or medication-induced bipolar and related disorder with onset during intoxication (HCC) 11/15/2015    History reviewed. No pertinent surgical history.  OB History    No data available       Home Medications    Prior to Admission medications   Medication Sig Start Date End Date Taking? Authorizing Provider  hydrOXYzine (ATARAX/VISTARIL) 25 MG tablet Take 3 tabs (75 mg) in the morning and 3 tabs (75 mg) at bedtime. 11/18/15   Adonis BrookSheila Agustin, NP    Family History No family history on file.  Social History Social History  Substance Use Topics  . Smoking status: Current Every Day Smoker    Packs/day: 0.50    Types: Cigarettes  . Smokeless tobacco: Never Used  . Alcohol use No     Comment: daily     Allergies   Tetracyclines & related   Review of Systems Review of Systems    All other systems reviewed and are negative.    Physical Exam Updated Vital Signs BP 122/84   Pulse 89   Temp 98.4 F (36.9 C)   Resp 18   Ht 5\' 3"  (1.6 m)   Wt 56.7 kg   LMP 09/03/2016   SpO2 94%   BMI 22.14 kg/m   Physical Exam  Constitutional: She is oriented to person, place, and time. She appears well-developed and well-nourished. No distress.  HENT:  Head: Normocephalic and atraumatic.  Right Ear: External ear normal.  Left Ear: External ear normal.  Nose: Nose normal.  Eyes: Conjunctivae and EOM are normal. Pupils are equal, round, and reactive to light.  Neck: Normal range of motion. Neck supple.  Cardiovascular: Normal rate, regular rhythm, normal heart sounds and intact distal pulses.   Pulmonary/Chest: Effort normal and breath sounds normal. No respiratory distress. She has no wheezes. She has no rales.  Abdominal: Soft. Bowel sounds are normal. She exhibits no distension.  Musculoskeletal: Normal range of motion.  Neurological: She is alert and oriented to person, place, and time. She exhibits normal muscle tone. Coordination normal.  Skin: Skin is warm and dry.  Psychiatric: She has a normal mood and affect. Her behavior is normal. Thought content normal.  Nursing note and vitals reviewed.    ED Treatments / Results  Labs (all labs ordered are listed, but only abnormal  results are displayed) Labs Reviewed  COMPREHENSIVE METABOLIC PANEL - Abnormal; Notable for the following:       Result Value   Glucose, Bld 100 (*)    All other components within normal limits  URINALYSIS, ROUTINE W REFLEX MICROSCOPIC - Abnormal; Notable for the following:    APPearance HAZY (*)    All other components within normal limits  CBC WITH DIFFERENTIAL/PLATELET  I-STAT CG4 LACTIC ACID, ED  I-STAT BETA HCG BLOOD, ED ([facility], [facility], [facility] ONLY)    EKG  EKG Interpretation None       Radiology Dg Chest 2 View  Result Date: 09/22/2016 CLINICAL DATA:  Cough, shortness of breath  and congestion for 2 months, worsening for week. Weakness and nausea, vomiting. Smoker. EXAM: CHEST  2 VIEW COMPARISON:  None. FINDINGS: Cardiomediastinal silhouette is normal. No pleural effusions or focal consolidations. Trachea projects midline and there is no pneumothorax. Soft tissue planes and included osseous structures are non-suspicious. Staples project over neck. IMPRESSION: Normal chest. Electronically Signed   By: Awilda Metro M.D.   On: 09/22/2016 14:00    Procedures Procedures (including critical care time)  Medications Ordered in ED Medications - No data to display   Initial Impression / Assessment and Plan / ED Course  I have reviewed the triage vital signs and the nursing notes.  Pertinent labs & imaging results that were available during my care of the patient were reviewed by me and considered in my medical decision making (see chart for details).     22 -year-old female presents today with complaints consistent with viral influenza-like illness. Labs, chest x-Elverda Wendel obtained here are normal. Clinically patient appears mildly dehydrated. She has not had any active vomiting but does have poor appetite. We have discussed stopping smoking, and pushing by mouth fluid. She is advised of return precautions and need for follow-up and voices understanding.She feels that this is significant with prior bronchitis. We discussed antibiotic usage and she agrees with my plan to not use any antibiotics.  Final Clinical Impressions(s) / ED Diagnoses   Final diagnoses:  Influenza-like illness    New Prescriptions New Prescriptions   No medications on file     Margarita Grizzle, MD 09/22/16 1443    Margarita Grizzle, MD 09/22/16 1444

## 2016-09-22 NOTE — ED Triage Notes (Addendum)
Pt states weakness for over a weak with body aches, especially in neck, armpits and back. Nausea but no vomiting.  Denies fevers, though has been taking ibuprofen.  Pt states she took her normal 1 mg ativan while waiting.

## 2016-09-22 NOTE — ED Notes (Signed)
Patient c/o generalized bodyaches for the last several days , states her appetite is decreased.

## 2017-09-26 ENCOUNTER — Emergency Department (HOSPITAL_COMMUNITY)
Admission: EM | Admit: 2017-09-26 | Discharge: 2017-09-27 | Disposition: A | Discharge status: Home / Self Care | Payer: BLUE CROSS/BLUE SHIELD | Attending: Emergency Medicine | Admitting: Emergency Medicine

## 2017-09-26 ENCOUNTER — Encounter (HOSPITAL_COMMUNITY): Payer: Self-pay | Admitting: Emergency Medicine

## 2017-09-26 DIAGNOSIS — F10920 Alcohol use, unspecified with intoxication, uncomplicated: Secondary | ICD-10-CM | POA: Insufficient documentation

## 2017-09-26 DIAGNOSIS — F17210 Nicotine dependence, cigarettes, uncomplicated: Secondary | ICD-10-CM | POA: Insufficient documentation

## 2017-09-26 DIAGNOSIS — F1014 Alcohol abuse with alcohol-induced mood disorder: Secondary | ICD-10-CM | POA: Diagnosis present

## 2017-09-26 DIAGNOSIS — Z79899 Other long term (current) drug therapy: Secondary | ICD-10-CM | POA: Diagnosis not present

## 2017-09-26 DIAGNOSIS — Y908 Blood alcohol level of 240 mg/100 ml or more: Secondary | ICD-10-CM | POA: Diagnosis not present

## 2017-09-26 DIAGNOSIS — Z8659 Personal history of other mental and behavioral disorders: Secondary | ICD-10-CM | POA: Insufficient documentation

## 2017-09-26 DIAGNOSIS — F10929 Alcohol use, unspecified with intoxication, unspecified: Secondary | ICD-10-CM

## 2017-09-26 LAB — ACETAMINOPHEN LEVEL

## 2017-09-26 LAB — RAPID URINE DRUG SCREEN, HOSP PERFORMED
Amphetamines: NOT DETECTED
BARBITURATES: NOT DETECTED
BENZODIAZEPINES: NOT DETECTED
Cocaine: NOT DETECTED
Opiates: NOT DETECTED
Tetrahydrocannabinol: NOT DETECTED

## 2017-09-26 LAB — COMPREHENSIVE METABOLIC PANEL
ALBUMIN: 4.5 g/dL (ref 3.5–5.0)
ALT: 20 U/L (ref 14–54)
AST: 18 U/L (ref 15–41)
Alkaline Phosphatase: 42 U/L (ref 38–126)
Anion gap: 9 (ref 5–15)
BUN: 15 mg/dL (ref 6–20)
CHLORIDE: 111 mmol/L (ref 101–111)
CO2: 21 mmol/L — AB (ref 22–32)
Calcium: 8.9 mg/dL (ref 8.9–10.3)
Creatinine, Ser: 0.55 mg/dL (ref 0.44–1.00)
GFR calc Af Amer: >60 mL/min (ref >60)
GFR calc non Af Amer: >60 mL/min (ref >60)
GLUCOSE: 104 mg/dL — AB (ref 65–99)
Potassium: 3.7 mmol/L (ref 3.5–5.1)
SODIUM: 141 mmol/L (ref 135–145)
Total Bilirubin: 0.3 mg/dL (ref 0.3–1.2)
Total Protein: 7.9 g/dL (ref 6.5–8.1)

## 2017-09-26 LAB — CBC
HEMATOCRIT: 41.4 % (ref 36.0–46.0)
HEMOGLOBIN: 14.3 g/dL (ref 12.0–15.0)
MCH: 29.4 pg (ref 26.0–34.0)
MCHC: 34.5 g/dL (ref 30.0–36.0)
MCV: 85.2 fL (ref 78.0–100.0)
Platelets: 286 10*3/uL (ref 150–400)
RBC: 4.86 MIL/uL (ref 3.87–5.11)
RDW: 13.8 % (ref 11.5–15.5)
WBC: 7.2 10*3/uL (ref 4.0–10.5)

## 2017-09-26 LAB — POC URINE PREG, ED: PREG TEST UR: NEGATIVE

## 2017-09-26 LAB — ETHANOL
ALCOHOL ETHYL (B): 128 mg/dL — AB (ref <10)
Alcohol, Ethyl (B): 334 mg/dL (ref <10)

## 2017-09-26 LAB — SALICYLATE LEVEL

## 2017-09-26 MED ORDER — HALOPERIDOL LACTATE 5 MG/ML IJ SOLN
5.0000 mg | Freq: Once | INTRAMUSCULAR | Status: AC
  Administered 2017-09-26: 5 mg via INTRAMUSCULAR
  Filled 2017-09-26: qty 1

## 2017-09-26 MED ORDER — BENZTROPINE MESYLATE 1 MG PO TABS: 1.0000 mg | ORAL_TABLET | Freq: Four times a day (QID) | ORAL | Status: DC | PRN

## 2017-09-26 MED ORDER — HALOPERIDOL 5 MG PO TABS: 5.0000 mg | ORAL_TABLET | Freq: Four times a day (QID) | ORAL | Status: DC | PRN

## 2017-09-26 NOTE — ED Triage Notes (Addendum)
Per EMS, patient coming from home, mother reports patient drank of tequila this morning. Mother expresses concern patient is not taking psych meds. A&Ox4.   BP 124/82 HR 116 O2 98% CBG 94

## 2017-09-26 NOTE — BH Assessment (Signed)
[facility] Assessment Progress Note  Pt has been sedated and is unable to be assessed at this time.   Johny ShockSamantha M. Ladona Ridgelaylor, MS, NCC, LPCA Counselor

## 2017-09-26 NOTE — ED Provider Notes (Signed)
Patient has been seen by TTS who are advising admission.  They feel that she is psychotic, not taking her medications and drinking in lieu of that.  They plan on admitting her as soon as they can find a bed.  They would like her alcohol level to be less than 200.  This should happen by 2100 p.m. today.   Mancel BaleWentz, Anaika Santillano, MD 09/26/17 [phone]

## 2017-09-26 NOTE — ED Notes (Signed)
Patient returned to TCU due to high ETOH level.

## 2017-09-26 NOTE — ED Provider Notes (Signed)
  Physical Exam  BP (!) 106/51 (BP Location: Right Arm)   Pulse 93   Temp 98.1 F (36.7 C) (Oral)   Resp 16   SpO2 99%   Physical Exam  Constitutional: She appears well-developed and well-nourished. No distress.  HENT:  Head: Normocephalic and atraumatic.  Eyes: Conjunctivae and EOM are normal. No scleral icterus.  Neck: Normal range of motion.  Pulmonary/Chest: Effort normal. No respiratory distress.  Neurological: She is alert.  Skin: No rash noted. She is not diaphoretic.  Psychiatric: She has a normal mood and affect.  Nursing note and vitals reviewed.   ED Course/Procedures     Procedures   Alcohol Level    Component Value Date/Time   ETH 128 (H) 09/26/2017 2231     MDM   Care handed off from previous provider K. Leaphart, PA-C.  Please see their note for further detail.  Briefly, patient presents with acute alcohol intoxication.  She does have extensive psychiatric medical history include anxiety, PTSD, substance abuse, alcohol abuse.  Mother reports that she drinks 500 mL of tequila this morning.  Mother is also concerned that she is not taking any of her psychiatric medications.  Provider was unable to obtain any information from patient at time of initial evaluation due to alcohol intoxication and noncompliance.  She denies any SI or HI behavior.  Lab work significant for alcohol level of 334.  Patient is sleeping/sedated with Hladol during time of shift change.  Will reassess patient when she is sober, and will consult TTS.  1913: Patient is awake at this time.  Nursing states that she has been yelling out as is including "I know how this works" and also "[place]  Patient is calm and cooperative with me.  I told her that TTS does need to evaluate her before deciding her disposition.  Patient agrees to this plan.  1958: TTS states that they recommend inpatient admission as patient is likely threat to herself and others,  responding to internal stimuli. They would like her ETOH level to be <200. Will recheck at 2100. Will place in psych hold.  2200: EtOH level improved at 128.  Will dispo according to inpatient recommendation.  Portions of this note were generated with Scientist, clinical (histocompatibility and immunogenetics)Dragon dictation software. Dictation errors may occur despite best attempts at proofreading.    Dietrich PatesKhatri, Joas Motton, PA-C 09/26/17 2310    Mancel BaleWentz, Elliott, MD 09/26/17 [phone]
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
## 2017-09-26 NOTE — ED Notes (Signed)
Contact information for pt mother Ph# [phone] Pt mother states she will call in the AM for update!

## 2017-09-26 NOTE — ED Notes (Signed)
While patient was briefly in the Acute Care side, patient's mother called to give some collateral information.  Patient has been living with Mom the past two years and has a history of substance abuse and bipolar disorder with psychotic episodes.  Patient recently has been delusional with disorganized thoughts.  Mother is concerned about patient's ability to take care of herself.  Patient also has a diagnosis of severe insomnia.  Dr. West CarboMackinson is her therapist and Dr. Marlyne BeardsJennings is her psychiatrist.  Her mother reports that one of her recent delusions is she thinks one of her teachers is a klan member.  She also has been having auditory hallucinations.  Her mother is Maple HudsonCarol Gehlhausen and her number is [phone].

## 2017-09-26 NOTE — ED Notes (Signed)
Pt currently eating supper

## 2017-09-26 NOTE — ED Provider Notes (Signed)
[facility]-EMERGENCY DEPT Provider Note   CSN: [id number] Arrival date & time: 09/26/17  1326     History   Chief Complaint Chief Complaint  Patient presents with  . Alcohol Intoxication    HPI Stacy Poole is a 27 y.o. female.  HPI 27 year old female past medical history significant for anxiety, mental disorder, PTSD, substance abuse, alcohol abuse presents to the emergency department today by EMS for alcohol intoxication.  Mother reports that patient drank a 500 mL of tequila this morning.  Mother also expresses concern that patient is not taking her psychiatric medications.  The patient has been delusional and "states that the nazi are after her."  Patient has a history of mental disorder.  Is on several medications.  Unable to obtain any information from patient at this time due to alcohol intoxication and being noncompliant with history and physical.  Level 5 caveat to alcohol intoxication.  Patient does deny any SI or HI behavior.  Patient is a tobacco user.  She is asking to smoke a cigarette.  We will not answer use of drugs or alcohol. Past Medical History:  Diagnosis Date  . Acute ear infection   . Anxiety   . Arachnoid cyst   . Fifth disease   . Insomnia   . Mental disorder   . Mononucleosis   . PTSD (post-traumatic stress disorder)   . Substance abuse [facility])     Patient Active Problem List   Diagnosis Date Noted  . Alcohol use disorder, moderate, dependence (HCC) 11/15/2015  . Cannabis use disorder, severe, dependence (HCC) 11/15/2015  . Substance or medication-induced bipolar and related disorder with onset during intoxication (HCC) 11/15/2015    History reviewed. No pertinent surgical history.  OB History    No data available       Home Medications    Prior to Admission medications   Medication Sig Start Date End Date Taking? Authorizing Provider  hydrOXYzine (ATARAX/VISTARIL) 25 MG tablet Take 3 tabs (75 mg) in the morning  and 3 tabs (75 mg) at bedtime. 11/18/15   Adonis Brook, NP    Family History No family history on file.  Social History Social History   Tobacco Use  . Smoking status: Current Every Day Smoker    Packs/day: 0.50    Types: Cigarettes  . Smokeless tobacco: Never Used  Substance Use Topics  . Alcohol use: No    Alcohol/week: 6.0 oz    Types: 10 Shots of liquor per week    Comment: daily  . Drug use: No    Comment: narcotics last used 09/25/11 0100,      Allergies   Tetracyclines & related   Review of Systems Review of Systems  Reason unable to perform ROS: Alcohol intoxication.     Physical Exam Updated Vital Signs BP (!) 106/51 (BP Location: Right Arm)   Pulse 93   Temp 98.1 F (36.7 C) (Oral)   Resp 16   SpO2 99%   Physical Exam  Constitutional: She is oriented to person, place, and time. She appears well-developed and well-nourished. No distress.  Patient appears intoxicated.  Smells strongly of alcohol.  Pacing in the room and alcohol.  HENT:  Head: Normocephalic and atraumatic.  Eyes: Right eye exhibits no discharge. Left eye exhibits no discharge. No scleral icterus.  Neck: Normal range of motion.  Pulmonary/Chest: Effort normal. No respiratory distress.  Musculoskeletal: Normal range of motion.  Neurological: She is alert and oriented to person, place, and  time.  Patient is not cooperative during exam.  She is alert to person place and time.  Skin: Skin is warm and dry. Capillary refill takes less than 2 seconds. No pallor.  Psychiatric: Her behavior is normal. Judgment and thought content normal.  Nursing note and vitals reviewed.    ED Treatments / Results  Labs (all labs ordered are listed, but only abnormal results are displayed) Labs Reviewed  COMPREHENSIVE METABOLIC PANEL - Abnormal; Notable for the following components:      Result Value   CO2 21 (*)    Glucose, Bld 104 (*)    All other components within normal limits  ETHANOL -  Abnormal; Notable for the following components:   Alcohol, Ethyl (B) 334 (*)    All other components within normal limits  ACETAMINOPHEN LEVEL - Abnormal; Notable for the following components:   Acetaminophen (Tylenol), Serum <10 (*)    All other components within normal limits  RAPID URINE DRUG SCREEN, HOSP PERFORMED  SALICYLATE LEVEL  CBC  POC URINE PREG, ED    EKG  EKG Interpretation None       Radiology No results found.  Procedures Procedures (including critical care time)  Medications Ordered in ED Medications  haloperidol lactate (HALDOL) injection 5 mg (5 mg Intramuscular Given 09/26/17 1434)     Initial Impression / Assessment and Plan / ED Course  I have reviewed the triage vital signs and the nursing notes.  Pertinent labs & imaging results that were available during my care of the patient were reviewed by me and considered in my medical decision making (see chart for details).     Patient presents to the emergency department today for evaluation of alcohol intoxication and possible psychiatric disorder.  Mother states that patient drink 500 mL of tequila this morning and has not been taking her psychiatric medications.  Patient denies SI at this time however patient has been very uncooperative during history and physical due to her intoxication.  Unsure if this is related to hallucinations and psychiatric disorder versus patient is just intoxicated.  Patient has no medical complaints at this time.  She does appear intoxicated.  Smells strongly of alcohol.  Medical screening lab work is relatively reassuring.  She does have an alcohol level of 334.  EKG shows normal QT.  Patient was very verbally aggressive and hostile towards staff.  Instructed patient several times have a seat in the room and would not listen.  She kept running up and down the hallway.  Patient given 5 mg of IM Haldol.  She is sleeping at this time.  Appears to be in no acute distress.  Patient  will need to be reassessed once clinically sober and evaluated by TTS for disposition.  Patient discussed with my attending who is agreed with this plan.  Final Clinical Impressions(s) / ED Diagnoses   Final diagnoses:  Alcoholic intoxication with complication [facility](HCC)    ED Discharge Orders    None       Wallace KellerLeaphart, Hester Forget T, PA-C 09/26/17 1614    Alvira MondaySchlossman, Erin, MD 09/26/17 [phone]

## 2017-09-26 NOTE — ED Notes (Signed)
"  [place]" Sudden onset of loud voiced  bx then tearful crying unable to say why she was sad and crying  Asked to return to bed but remained hyperverbal and requesting discharge.

## 2017-09-26 NOTE — ED Notes (Signed)
Pt assisted to use phone to call mother

## 2017-09-26 NOTE — ED Notes (Signed)
Pt acting defensive towards nurse telling her "This is not a joke" and using defensive and mocking body language.

## 2017-09-26 NOTE — ED Notes (Signed)
Pt stating "I want my phone and my discharge papers now"

## 2017-09-27 ENCOUNTER — Other Ambulatory Visit: Payer: Self-pay

## 2017-09-27 DIAGNOSIS — F1014 Alcohol abuse with alcohol-induced mood disorder: Secondary | ICD-10-CM | POA: Diagnosis not present

## 2017-09-27 DIAGNOSIS — F17210 Nicotine dependence, cigarettes, uncomplicated: Secondary | ICD-10-CM

## 2017-09-27 MED ORDER — VITAMIN B-1 100 MG PO TABS
100.0000 mg | ORAL_TABLET | Freq: Every day | ORAL | Status: DC
  Administered 2017-09-27: 100 mg via ORAL
  Filled 2017-09-27: qty 1

## 2017-09-27 MED ORDER — THIAMINE HCL 100 MG/ML IJ SOLN: 100.0000 mg | Freq: Every day | INTRAMUSCULAR | Status: DC

## 2017-09-27 MED ORDER — LORAZEPAM 2 MG/ML IJ SOLN: 0.0000 mg | Freq: Four times a day (QID) | INTRAMUSCULAR | Status: DC

## 2017-09-27 MED ORDER — LORAZEPAM 2 MG/ML IJ SOLN: 0.0000 mg | Freq: Two times a day (BID) | INTRAMUSCULAR | Status: DC

## 2017-09-27 MED ORDER — LORAZEPAM 1 MG PO TABS: 0.0000 mg | ORAL_TABLET | Freq: Two times a day (BID) | ORAL | Status: DC

## 2017-09-27 MED ORDER — LORAZEPAM 1 MG PO TABS
0.0000 mg | ORAL_TABLET | Freq: Four times a day (QID) | ORAL | Status: DC
  Administered 2017-09-27: 2 mg via ORAL
  Filled 2017-09-27: qty 2

## 2017-09-27 NOTE — ED Notes (Signed)
Patient denies SI/HI/AVH at this time. Plan of care discussed. Encouragement and support provided and safety maintain. Q 15 min safety checks remain in place and video monitoring. 

## 2017-09-27 NOTE — Discharge Instructions (Addendum)
For your behavioral health needs, you are advised to follow up with your current outpatient providers:       Beverly MilchGlenn Jennings, MD      [facility]      [address]      [place], [place] [address]      [phone]       Adelene AmasLinda Makinson, PhD      [facility]      [address]      [place], [place] [address]      [phone]  If you would like to seek psychiatry services from another provider, contact the following practices:       [facility]      [address]      [place], [place] [address]      [phone]       [facility]      [address]      [place], [place] [address]      [phone]       [facility]      [address]      [place], [place] [address]      [phone]
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
## 2017-09-27 NOTE — ED Notes (Signed)
Pt d/c home per MD order. Discharge summary reviewed with pt. Pt verbalizes understanding. Pt denies SI/HI/AVH. Pt signed for personal property and property returned. Pt signed e-signature. Ambulatory off unit. Pt mother in lobby.

## 2017-09-27 NOTE — [facility] Suicide Risk Assessment (Signed)
Suicide Risk Assessment  Discharge Assessment   [facility]   Principal Problem: Alcohol abuse with alcohol-induced mood disorder [facility](HCC) Discharge Diagnoses:  Patient Active Problem List   Diagnosis Date Noted  . Alcohol abuse with alcohol-induced mood disorder ([facility]) [F10.14] 09/27/2017    Priority: High  . Alcohol use disorder, moderate, dependence ([facility]) [F10.20] 11/15/2015  . Cannabis use disorder, severe, dependence ([facility]) [F12.20] 11/15/2015    Total Time spent with patient: 45 minutes   Musculoskeletal: Strength & Muscle Tone: within normal limits Gait & Station: normal Patient leans: N/A  Psychiatric Specialty Exam: Physical Exam  Constitutional: She is oriented to person, place, and time. She appears well-developed and well-nourished.  HENT:  Head: Normocephalic.  Neck: Normal range of motion.  Respiratory: Effort normal.  Musculoskeletal: Normal range of motion.  Neurological: She is alert and oriented to person, place, and time.  Psychiatric: She has a normal mood and affect. Her speech is normal and behavior is normal. Judgment and thought content normal. Cognition and memory are normal.    Review of Systems  Psychiatric/Behavioral: Positive for substance abuse.  All other systems reviewed and are negative.   Blood pressure (!) 106/57, pulse 94, temperature 98.4 F (36.9 C), temperature source Oral, resp. rate 18, SpO2 100 %.There is no height or weight on file to calculate BMI.  General Appearance: Casual  Eye Contact:  Good  Speech:  Normal Rate  Volume:  Normal  Mood:  Euthymic  Affect:  Congruent  Thought Process:  Coherent and Descriptions of Associations: Intact  Orientation:  Full (Time, Place, and Person)  Thought Content:  WDL and Logical  Suicidal Thoughts:  No  Homicidal Thoughts:  No  Memory:  Immediate;   Good Recent;   Good Remote;   Good  Judgement:  Fair  Insight:  Fair  Psychomotor Activity:  Normal   Concentration:  Concentration: Good and Attention Span: Good  Recall:  Good  Fund of Knowledge:  Good  Language:  Good  Akathisia:  No  Handed:  Right  AIMS (if indicated):     Assets:  Housing Leisure Time Physical Health Resilience Social Support Vocational/Educational  ADL's:  Intact  Cognition:  WNL  Sleep:      Mental Status Per Nursing Assessment::   On Admission:   alcohol intoxication with delusions  Demographic Factors:  Adolescent or young adult  Loss Factors: NA  Historical Factors: NA  Risk Reduction Factors:   Sense of responsibility to family, Living with another person, especially a relative, Positive social support and Positive therapeutic relationship  Continued Clinical Symptoms:  None   Cognitive Features That Contribute To Risk:  None    Suicide Risk:  Minimal: No identifiable suicidal ideation.  Patients presenting with no risk factors but with morbid ruminations; may be classified as minimal risk based on the severity of the depressive symptoms    Plan Of Care/Follow-up recommendations:  Activity:  as tolerated Diet:  heart healthy diet  Tyana Butzer, NP 09/27/2017, 1:11 PM

## 2017-09-27 NOTE — [facility] Assessment (Signed)
[facility]  Per Juanetta BeetsJacqueline Norman, DO, this pt does not require psychiatric hospitalization at this time.  [facility] to follow up with her current outpatient providers, Beverly MilchGlenn Jennings, MD and Adelene AmasLinda Makinson, PhD.  This has been included in pt's discharge instructions.  Pt's nurse, Morrie Sheldonshley, has been notified.  Doylene Canninghomas Shown Dissinger, MA Triage Specialist [phone]

## 2018-01-31 ENCOUNTER — Encounter (HOSPITAL_COMMUNITY): Payer: Self-pay | Admitting: Nurse Practitioner

## 2018-01-31 ENCOUNTER — Other Ambulatory Visit: Payer: Self-pay

## 2018-01-31 ENCOUNTER — Emergency Department (HOSPITAL_COMMUNITY)
Admission: EM | Admit: 2018-01-31 | Discharge: 2018-02-01 | Disposition: A | Discharge status: Home / Self Care | Payer: BLUE CROSS/BLUE SHIELD | Attending: Emergency Medicine | Admitting: Emergency Medicine

## 2018-01-31 DIAGNOSIS — R443 Hallucinations, unspecified: Secondary | ICD-10-CM | POA: Diagnosis present

## 2018-01-31 DIAGNOSIS — F15951 Other stimulant use, unspecified with stimulant-induced psychotic disorder with hallucinations: Secondary | ICD-10-CM | POA: Diagnosis present

## 2018-01-31 LAB — COMPREHENSIVE METABOLIC PANEL
ALBUMIN: 3.9 g/dL (ref 3.5–5.0)
ALK PHOS: 56 U/L (ref 38–126)
ALT: 18 U/L (ref 14–54)
ANION GAP: 13 (ref 5–15)
AST: 21 U/L (ref 15–41)
BUN: 17 mg/dL (ref 6–20)
CALCIUM: 8.8 mg/dL — AB (ref 8.9–10.3)
CO2: 23 mmol/L (ref 22–32)
CREATININE: 0.81 mg/dL (ref 0.44–1.00)
Chloride: 105 mmol/L (ref 101–111)
GFR calc Af Amer: >60 mL/min (ref >60)
GFR calc non Af Amer: >60 mL/min (ref >60)
GLUCOSE: 85 mg/dL (ref 65–99)
Potassium: 3.5 mmol/L (ref 3.5–5.1)
SODIUM: 141 mmol/L (ref 135–145)
Total Bilirubin: 0.8 mg/dL (ref 0.3–1.2)
Total Protein: 6.7 g/dL (ref 6.5–8.1)

## 2018-01-31 LAB — CBC WITH DIFFERENTIAL/PLATELET
Basophils Absolute: 0 10*3/uL (ref 0.0–0.1)
Basophils Relative: 0 %
EOS ABS: 0.2 10*3/uL (ref 0.0–0.7)
Eosinophils Relative: 3 %
HCT: 37.6 % (ref 36.0–46.0)
HEMOGLOBIN: 12.5 g/dL (ref 12.0–15.0)
LYMPHS ABS: 2.4 10*3/uL (ref 0.7–4.0)
Lymphocytes Relative: 35 %
MCH: 29.3 pg (ref 26.0–34.0)
MCHC: 33.2 g/dL (ref 30.0–36.0)
MCV: 88.3 fL (ref 78.0–100.0)
MONOS PCT: 9 %
Monocytes Absolute: 0.6 10*3/uL (ref 0.1–1.0)
NEUTROS PCT: 53 %
Neutro Abs: 3.7 10*3/uL (ref 1.7–7.7)
Platelets: 235 10*3/uL (ref 150–400)
RBC: 4.26 MIL/uL (ref 3.87–5.11)
RDW: 14.2 % (ref 11.5–15.5)
WBC: 6.8 10*3/uL (ref 4.0–10.5)

## 2018-01-31 LAB — ETHANOL: Alcohol, Ethyl (B): <10 mg/dL (ref <10)

## 2018-01-31 MED ORDER — LORAZEPAM 1 MG PO TABS: 0.0000 mg | ORAL_TABLET | Freq: Two times a day (BID) | ORAL | Status: DC

## 2018-01-31 MED ORDER — THIAMINE HCL 100 MG/ML IJ SOLN: 100.0000 mg | Freq: Every day | INTRAMUSCULAR | Status: DC

## 2018-01-31 MED ORDER — HALOPERIDOL LACTATE 5 MG/ML IJ SOLN
5.0000 mg | Freq: Once | INTRAMUSCULAR | Status: AC
  Administered 2018-01-31: 5 mg via INTRAMUSCULAR
  Filled 2018-01-31: qty 1

## 2018-01-31 MED ORDER — LORAZEPAM 2 MG/ML IJ SOLN: 0.0000 mg | Freq: Two times a day (BID) | INTRAMUSCULAR | Status: DC

## 2018-01-31 MED ORDER — LORAZEPAM 2 MG/ML IJ SOLN: 0.0000 mg | Freq: Four times a day (QID) | INTRAMUSCULAR | Status: DC

## 2018-01-31 MED ORDER — VITAMIN B-1 100 MG PO TABS
100.0000 mg | ORAL_TABLET | Freq: Every day | ORAL | Status: DC
  Administered 2018-02-01: 100 mg via ORAL
  Filled 2018-01-31: qty 1

## 2018-01-31 MED ORDER — LORAZEPAM 1 MG PO TABS
0.0000 mg | ORAL_TABLET | Freq: Four times a day (QID) | ORAL | Status: DC
  Filled 2018-01-31: qty 1

## 2018-01-31 NOTE — ED Notes (Signed)
Patient having auditory hallucinations patient has been awake for days. Patient's mother states she responds really well to haldol and cogentin.  Psychiatrist Dr. Marlyne Beards Therapist Dr. Leonarda Salon  Mother- [facility]- [phone] Mother has the phone numbers for her Drs if needed

## 2018-01-31 NOTE — ED Notes (Signed)
Bed: WA29 Expected date:  Expected time:  Means of arrival:  Comments: Nechama Guard

## 2018-01-31 NOTE — ED Provider Notes (Signed)
[facility]-EMERGENCY DEPT Provider Note   CSN: [id number] Arrival date & time: 01/31/18  1735     History   Chief Complaint No chief complaint on file.   HPI Stacy Poole is a 27 y.o. female. Level 5 caveat due to psychiatric disorder. HPI Patient reportedly brought in for hallucinations and not taking her medicine.  History of same.  Patient states she was brought in for no reason and will not really participate in any history.  She is yelling.  Also reported history of substance abuse. Past Medical History:  Diagnosis Date  . Acute ear infection   . Anxiety   . Arachnoid cyst   . Fifth disease   . Insomnia   . Mental disorder   . Mononucleosis   . PTSD (post-traumatic stress disorder)   . Substance abuse [facility])     Patient Active Problem List   Diagnosis Date Noted  . Alcohol abuse with alcohol-induced mood disorder (HCC) 09/27/2017  . Alcohol use disorder, moderate, dependence (HCC) 11/15/2015  . Cannabis use disorder, severe, dependence (HCC) 11/15/2015    History reviewed. No pertinent surgical history.   OB History   None      Home Medications    Prior to Admission medications   Medication Sig Start Date End Date Taking? Authorizing Provider  Coenzyme Q10 (COQ-10 PO) Take 1 tablet by mouth daily.   Yes [provider]  hydrOXYzine (ATARAX/VISTARIL) 25 MG tablet Take 3 tabs (75 mg) in the morning and 3 tabs (75 mg) at bedtime. 11/18/15  Yes Adonis Brook, NP  benztropine (COGENTIN) 0.5 MG tablet Take 0.5 mg by mouth 2 (two) times daily. 12/07/17   [provider]  haloperidol (HALDOL) 0.5 MG tablet Take 0.5 mg by mouth 2 (two) times daily. 12/07/17   [provider]  LORazepam (ATIVAN) 1 MG tablet Take 1 mg by mouth 3 (three) times daily as needed for anxiety. 11/01/17   [provider]    Family History History reviewed. No pertinent family history.  Social History Social History   Tobacco  Use  . Smoking status: Current Every Day Smoker    Packs/day: 0.50    Types: Cigarettes  . Smokeless tobacco: Never Used  Substance Use Topics  . Alcohol use: Yes    Alcohol/week: 6.0 oz    Types: 10 Shots of liquor per week    Comment: daily  . Drug use: No    Types: Codeine, Benzodiazepines, Other-see comments    Comment: narcotics last used 09/25/11 0100,      Allergies   Tetracyclines & related   Review of Systems Review of Systems  Unable to perform ROS: Psychiatric disorder     Physical Exam Updated Vital Signs BP (!) 154/89 (BP Location: Right Arm)   Pulse (!) 103   Temp 98.2 F (36.8 C) (Oral)   Resp 16   Ht 5' 3.5" (1.613 m)   Wt 57.2 kg (126 lb)   SpO2 99%   BMI 21.97 kg/m   Physical Exam  Constitutional: She appears well-developed.  Eyes: Pupils are equal, round, and reactive to light.  Neck: Neck supple.  Cardiovascular:  Mild tachycardia  Abdominal: There is no tenderness.  Musculoskeletal: She exhibits no tenderness.  Neurological:  Patient is awake.  Initially had a had covered in blanket.  Somewhat uncooperative.  Will not participate much in the history.  Psychiatric:  Patient is somewhat agitated and yelling.     ED Treatments / Results  Labs (all labs ordered are listed, but only abnormal results are displayed) Labs Reviewed  COMPREHENSIVE METABOLIC PANEL - Abnormal; Notable for the following components:      Result Value   Calcium 8.8 (*)    All other components within normal limits  ETHANOL  CBC WITH DIFFERENTIAL/PLATELET  URINALYSIS, ROUTINE W REFLEX MICROSCOPIC  PREGNANCY, URINE  RAPID URINE DRUG SCREEN, HOSP PERFORMED    EKG None  Radiology No results found.  Procedures Procedures (including critical care time)  Medications Ordered in ED Medications  haloperidol lactate (HALDOL) injection 5 mg (5 mg Intramuscular Given 01/31/18 1949)     Initial Impression / Assessment and Plan / ED Course  I have reviewed the  triage vital signs and the nursing notes.  Pertinent labs & imaging results that were available during my care of the patient were reviewed by me and considered in my medical decision making (see chart for details).     Patient brought in for psychiatric clearance.  Uncooperative.  Initially given Haldol to help with control.  Labs reassuring but urine labs still pending.  Medically cleared but pending urine.  To be seen by TTS.  Final Clinical Impressions(s) / ED Diagnoses   Final diagnoses:  Hallucinations    ED Discharge Orders    None       Benjiman Core, MD 01/31/18 2325
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
## 2018-02-01 DIAGNOSIS — F15951 Other stimulant use, unspecified with stimulant-induced psychotic disorder with hallucinations: Secondary | ICD-10-CM | POA: Diagnosis not present

## 2018-02-01 LAB — URINALYSIS, ROUTINE W REFLEX MICROSCOPIC
BILIRUBIN URINE: NEGATIVE
Glucose, UA: NEGATIVE mg/dL
HGB URINE DIPSTICK: NEGATIVE
KETONES UR: 80 mg/dL — AB
LEUKOCYTES UA: NEGATIVE
NITRITE: NEGATIVE
PH: 5 (ref 5.0–8.0)
Protein, ur: NEGATIVE mg/dL
Specific Gravity, Urine: 1.015 (ref 1.005–1.030)

## 2018-02-01 LAB — RAPID URINE DRUG SCREEN, HOSP PERFORMED
Amphetamines: NOT DETECTED
BARBITURATES: NOT DETECTED
Benzodiazepines: NOT DETECTED
Cocaine: POSITIVE — AB
OPIATES: NOT DETECTED
TETRAHYDROCANNABINOL: NOT DETECTED

## 2018-02-01 LAB — PREGNANCY, URINE: Preg Test, Ur: NEGATIVE

## 2018-02-01 LAB — I-STAT BETA HCG BLOOD, ED (MC, WL, AP ONLY)

## 2018-02-01 NOTE — ED Notes (Signed)
Ativan for alcohol detox not given. Pt sleepy with difficulty staying awake long enough to talk or interact.

## 2018-02-01 NOTE — ED Notes (Signed)
Pt discharged home. Discharged instructions read to pt who verbalized understanding. All belongings returned to pt who signed for same. Denies SI/HI, is not delusional and not responding to internal stimuli. Escorted pt to the ED exit.    

## 2018-02-01 NOTE — BHH Suicide Risk Assessment (Signed)
[facility]   Principal Problem: Stimulant-induced psychotic disorder with hallucinations [facility](HCC) Discharge Diagnoses:  Patient Active Problem List   Diagnosis Date Noted  . Alcohol abuse with alcohol-induced mood disorder (HCC) [F10.14] 09/27/2017  . Alcohol use disorder, moderate, dependence (HCC) [F10.20] 11/15/2015  . Cannabis use disorder, severe, dependence (HCC) [F12.20] 11/15/2015   Ms. Fabro denies SI, HI or AVH. She reports using cocaine recently while at a party. She denies problematic use. She denies a history of mental illness although per chart review she has a history of alcohol abuse, cannabis abuse, PTSD and anxiety. She is not taking any medications at this time. She feels safe for discharge home and agrees to follow up wit her psychiatrist.   Total Time spent with patient: 30 minutes  Musculoskeletal: Strength & Muscle Tone: within normal limits Gait & Station: normal Patient leans: N/A  Psychiatric Specialty Exam: Review of Systems  Psychiatric/Behavioral: Positive for substance abuse. Negative for suicidal ideas.  All other systems reviewed and are negative.   Blood pressure 124/81, pulse (!) 55, temperature 98.5 F (36.9 C), temperature source Oral, resp. rate 17, height 5' 3.5" (1.613 m), weight 57.2 kg (126 lb), SpO2 97 %.Body mass index is 21.97 kg/m.  General Appearance: Fairly Groomed, young, Caucasian female, wearing paper hospital scrubs with long brushed hair who is lying in bed and frequently falling asleep. NAD.   Eye Contact::  Good  Speech:  Slurred  Volume:  Decreased  Mood:  Euthymic  Affect:  Appropriate  Thought Process:  Goal Directed, Linear and Descriptions of Associations: Intact  Orientation:  Full (Time, Place, and Person)  Thought Content:  Logical  Suicidal Thoughts:  No  Homicidal Thoughts:  No  Memory:  Immediate;   Fair Recent;   Fair Remote;   Fair  Judgement:  Fair  Insight:  Fair  Psychomotor  Activity:  Decreased due to drowsiness.   Concentration:  Fair  Recall:  FiservFair  Fund of Knowledge:Fair  Language: Good  Akathisia:  No  Handed:  Right  AIMS (if indicated):   N/A  Assets:  Housing  Sleep:   N/A  Cognition: WNL  ADL's:  Intact   Mental Status Per Nursing Assessment::   On Admission:    "Pt presents irritable, quiet/awake in scrubs with logical/coherent speech. Pt's eye contact was poor. Pt's mood was irritable. Clinician was unable to assess pt's affect as she had her head under the covers during the assessment. Pt's thought process was coherent/relevant. Pt's judgement was impaired. Pt's was oriented x3. Pt's concentration, insight and impulse control are fair. Pt [facility], [facility] she could [facility] for safety. Pt reported, if inpatient treatment was recommended she would not sign in voluntarily."     Demographic Factors:  Living alone  Loss Factors: NA  Historical Factors: Impulsivity  Risk Reduction Factors:   Positive therapeutic relationship  Continued Clinical Symptoms:  Alcohol/Substance Abuse/Dependencies  Cognitive Features That Contribute To Risk:  None    Suicide Risk:  Minimal: No identifiable suicidal ideation.  Patients presenting with no risk factors but with morbid ruminations; may be classified as minimal risk based on the severity of the depressive symptoms  Assessment:  Oliver PilaStephanie W Propp is a 27 y.o. female who was admitted with hallucinations in the setting of cocaine use. She denies SI, HI or AVH at this time. She is not responding to internal stimuli. She does not warrant inpatient psychiatric hospitalization and will follow up with her outpatient  psychiatrist.   Plan Of Care/Follow-up recommendations:  -Patient will follow up with outpatient psychiatrist, Dr. Donell Beers.   Cherly Beach, DO 02/01/2018, 12:20 PM

## 2018-02-01 NOTE — ED Notes (Signed)
On admission to the Acute Unit pt has a flat affect and communication is minimal. She signed consent for her mother to receive information. She is cooperative.

## 2018-02-01 NOTE — ED Notes (Addendum)
Pt Belongings Located in locker #40.  Belongings: Black jacket, black jeans, tan boots/shoes. Belongings itemized on belongings sheet which is located in pts medical chart at nurses station. Pt was unable to sign belongings sheet as pt appeared medicated and would not sign. RN notified of pts response to this Clinical research associate. Pts response to this writers attempt to get her to sign belongings sheet may have been manipulative behavior.

## 2018-02-01 NOTE — Discharge Instructions (Signed)
For your behavioral health needs, you are advised to continue treatment with Gerald Plovsky, MD: ° °     Gerald Plovsky, MD °     [facility] °     [address], [address] °     [place], [place] [address] °     [phone] °

## 2018-02-01 NOTE — BH Assessment (Addendum)
Assessment Note  Stacy Poole is an 27 y.o. female, who presents voluntary and unaccompanied to [facility]. Clinician asked the pt, "what brought you to the hospital?" Pt reported, "my mom." Clinician asked, "why did you mom bring you to the hospital?" Pt reported, "I have no idea." Clinician expressed to the pt, she was going to ask her some questions, and get a recommendation on the level of help needed. Clinician asked the pt of her marital status. Pt replied, "I have no fucking idea." Pt reported, earlier today she seen flashing lights. Pt denies, SI, HI, self-injurious behaviors and access to weapons.  Clinician asked the pt if she had a history of abuse. Pt replied, "no comment." Pt's UDS is pending. Pt reported, smoking marijuana about two years ago. Pt reported, she is linked to Dr. Donell Beers for medication management however she does not take any medications because she switched doctors. Pt reported, she has not seen Dr. Donell Beers in 2-3 months. Pt reported, she last seen her counselor (Dr. Windle Guard) a couple weeks ago. Pt denies, previous inpatient admissions.   Pt presents irritable, quiet/awake in scrubs with logical/coherent speech. Pt's eye contact was poor. Pt's mood was irritable. Clinician was unable to assess pt's affect as she had her head under the covers during the assessment. Pt's thought process was coherent/relevant. Pt's judgement was impaired. Pt's was oriented x3. Pt's concentration, insight and impulse control are fair. Pt reported, if discharged from [facility] she could contract for safety. Pt reported, if inpatient treatment was recommended she would not sign in voluntarily.   Diagnosis: F43.10 Post Traumatic Stress Disorder.  Past Medical History:  Past Medical History:  Diagnosis Date  . Acute ear infection   . Anxiety   . Arachnoid cyst   . Fifth disease   . Insomnia   . Mental disorder   . Mononucleosis   . PTSD (post-traumatic stress disorder)   . Substance abuse (HCC)      History reviewed. No pertinent surgical history.  Family History: History reviewed. No pertinent family history.  Social History:  reports that she has been smoking cigarettes.  She has been smoking about 0.50 packs per day. She has never used smokeless tobacco. She reports that she drinks about 6.0 oz of alcohol per week. She reports that she does not use drugs.  Additional Social History:  Alcohol / Drug Use Pain Medications: See MAR Prescriptions: See MAR Over the Counter: See MAR History of alcohol / drug use?: (UDS is pending. )  CIWA: CIWA-Ar BP: (!) 154/89 Pulse Rate: (!) 103 Nausea and Vomiting: no nausea and no vomiting Tactile Disturbances: none Tremor: no tremor Auditory Disturbances: not present Paroxysmal Sweats: no sweat visible Visual Disturbances: not present Anxiety: no anxiety, at ease Headache, Fullness in Head: none present Agitation: normal activity Orientation and Clouding of Sensorium: cannot do serial additions or is uncertain about date CIWA-Ar Total: 1 COWS:    Allergies:  Allergies  Allergen Reactions  . Tetracyclines & Related Other (See Comments)    Ear popping, couldn't move neck/back, and had blind spots/double vision    Home Medications:  (Not in a hospital admission)  OB/GYN Status:  No LMP recorded.  General Assessment Data Location of Assessment: [facility] ED TTS Assessment: In system Is this a Tele or Face-to-Face Assessment?: Face-to-Face Is this an Initial Assessment or a Re-assessment for this encounter?: Initial Assessment Marital status: Other (comment)(Pt reported, "I don't fucking know.") Is patient pregnant?: No Pregnancy Status: No Living Arrangements: Alone Can  pt return to current living arrangement?: Yes Admission Status: Voluntary Is patient capable of signing voluntary admission?: Yes Referral Source: Self/Family/Friend Insurance type: BCBS     Crisis Care Plan Living Arrangements: Alone Legal Guardian:  Other:(Self.) Name of Psychiatrist: Dr. Donell Beers. Name of Therapist: Dr. Leonarda Salon  Education Status Is patient currently in school?: No Is the patient employed, unemployed or receiving disability?: Unemployed(Pt reported, she tink she was fired.)  Risk to self with the past 6 months Suicidal Ideation: No(Pt denies. ) Has patient been a risk to self within the past 6 months prior to admission? : No Suicidal Intent: No Has patient had any suicidal intent within the past 6 months prior to admission? : No Is patient at risk for suicide?: No Suicidal Plan?: No Has patient had any suicidal plan within the past 6 months prior to admission? : No Access to Means: No What has been your use of drugs/alcohol within the last 12 months?: UDS is pending. Previous Attempts/Gestures: No(Pt denies. ) How many times?: 0 Other Self Harm Risks: Pt denies. Triggers for Past Attempts: None known Intentional Self Injurious Behavior: None(Pt denies. ) Family Suicide History: Unable to assess Recent stressful life event(s): Other (Comment)(UTA) Persecutory voices/beliefs?: No Depression: Yes Depression Symptoms: Feeling angry/irritable, Feeling worthless/self pity, Loss of interest in usual pleasures, Guilt, Fatigue, Isolating, Tearfulness Substance abuse history and/or treatment for substance abuse?: No Suicide prevention information given to non-admitted patients: Not applicable  Risk to Others within the past 6 months Homicidal Ideation: No(Pt denies. ) Does patient have any lifetime risk of violence toward others beyond the six months prior to admission? : No Thoughts of Harm to Others: No Current Homicidal Intent: No Current Homicidal Plan: No Access to Homicidal Means: No Identified Victim: NA History of harm to others?: No Assessment of Violence: None Noted Violent Behavior Description: NA Does patient have access to weapons?: No Criminal Charges Pending?: No Does patient have a court date:  No Is patient on probation?: No  Psychosis Hallucinations: Visual Delusions: None noted  Mental Status Report Appearance/Hygiene: In scrubs Eye Contact: Poor Motor Activity: Unremarkable Speech: Logical/coherent Level of Consciousness: Quiet/awake, Irritable Mood: Irritable Affect: Unable to Assess(Pt had the covers over her head during the assessment. ) Anxiety Level: Minimal Thought Processes: Coherent, Relevant Judgement: Impaired Orientation: Person, Place, Time Obsessive Compulsive Thoughts/Behaviors: None  Cognitive Functioning Concentration: Fair Memory: Recent Intact Is patient IDD: No Is patient DD?: No Insight: Fair Impulse Control: Fair Appetite: Fair Sleep: Increased Total Hours of Sleep: 10 Vegetative Symptoms: Unable to Assess  ADLScreening [facility] Assessment Services) Patient's cognitive ability adequate to safely complete daily activities?: Yes Patient able to express need for assistance with ADLs?: Yes Independently performs ADLs?: Yes (appropriate for developmental age)  Prior Inpatient Therapy Prior Inpatient Therapy: No  Prior Outpatient Therapy Prior Outpatient Therapy: Yes Prior Therapy Dates: Current Prior Therapy Facilty/Provider(s): Dr. Donell Beers and Dr. Leonarda Salon. Reason for Treatment: Medication management and counseling.  Does patient have an ACCT team?: No Does patient have Intensive In-House Services?  : No Does patient have [facility] services? : No Does patient have P4CC services?: No  ADL Screening (condition at time of admission) Patient's cognitive ability adequate to safely complete daily activities?: Yes Is the patient deaf or have difficulty hearing?: No Does the patient have difficulty seeing, even when wearing glasses/contacts?: No Does the patient have difficulty concentrating, remembering, or making decisions?: Yes Patient able to express need for assistance with ADLs?: Yes Does the patient have difficulty dressing or  bathing?:  No Independently performs ADLs?: Yes (appropriate for developmental age) Does the patient have difficulty walking or climbing stairs?: No Weakness of Legs: None Weakness of Arms/Hands: None  Home Assistive Devices/Equipment Home Assistive Devices/Equipment: None    Abuse/Neglect Assessment (Assessment to be complete while patient is alone) Abuse/Neglect Assessment Can Be Completed: (Pt reported, "no comment." )     Advance Directives (For Healthcare) Does Patient Have a Medical Advance Directive?: (UTA)    Additional Information 1:1 In Past 12 Months?: No CIRT Risk: No Elopement Risk: No Does patient have medical clearance?: No     Disposition: Maryjean Mornharles Kober, PA recommends overnight observation for safety and stabilization. Disposition discussed with Tresa EndoKelly, PA and Zella Ballobin, RN.   Disposition Initial Assessment Completed for this Encounter: Yes Disposition of Patient: (overnight observation for safety and stabilization.) Patient refused recommended treatment: No Mode of transportation if patient is discharged?: N/A On Site Evaluation by: Redmond Pullingreylese D Dwayne Bulkley, MS, LPC, CRC. Reviewed with Physician: Tresa EndoKelly, PA and Maryjean Mornharles Kober, PA.  Redmond Pullingreylese D Onelia Cadmus 02/01/2018 2:36 AM  Redmond Pullingreylese D Devlyn Retter, MS, [facility], [facility] Triage Specialist [phone]

## 2018-02-01 NOTE — BH Assessment (Signed)
[facility]  Per Juanetta BeetsJacqueline Norman, DO, this pt does not require psychiatric hospitalization at this time.  [facility] with recommendation to continue treatment with Archer AsaGerald Plovsky, MD.  This has been included in pt's discharge instructions.  Pt's nurse, Diane, has been notified.  Doylene Canninghomas Daelyn Mozer, MA Triage Specialist [phone]

## 2018-04-25 ENCOUNTER — Emergency Department (HOSPITAL_COMMUNITY)
Admission: EM | Admit: 2018-04-25 | Discharge: 2018-04-26 | Disposition: A | Discharge status: Home / Self Care | Payer: BLUE CROSS/BLUE SHIELD | Attending: Emergency Medicine | Admitting: Emergency Medicine

## 2018-04-25 ENCOUNTER — Other Ambulatory Visit: Payer: Self-pay

## 2018-04-25 DIAGNOSIS — Y901 Blood alcohol level of 20-39 mg/100 ml: Secondary | ICD-10-CM | POA: Diagnosis not present

## 2018-04-25 DIAGNOSIS — F1014 Alcohol abuse with alcohol-induced mood disorder: Secondary | ICD-10-CM | POA: Insufficient documentation

## 2018-04-25 DIAGNOSIS — R443 Hallucinations, unspecified: Secondary | ICD-10-CM | POA: Diagnosis not present

## 2018-04-25 DIAGNOSIS — F3010 Manic episode without psychotic symptoms, unspecified: Secondary | ICD-10-CM | POA: Diagnosis not present

## 2018-04-25 DIAGNOSIS — F17210 Nicotine dependence, cigarettes, uncomplicated: Secondary | ICD-10-CM | POA: Diagnosis not present

## 2018-04-25 DIAGNOSIS — F259 Schizoaffective disorder, unspecified: Secondary | ICD-10-CM | POA: Diagnosis not present

## 2018-04-25 LAB — COMPREHENSIVE METABOLIC PANEL
ALBUMIN: 4.8 g/dL (ref 3.5–5.0)
ALK PHOS: 51 U/L (ref 38–126)
ALT: 18 U/L (ref 0–44)
AST: 27 U/L (ref 15–41)
Anion gap: 15 (ref 5–15)
BILIRUBIN TOTAL: 0.6 mg/dL (ref 0.3–1.2)
BUN: 18 mg/dL (ref 6–20)
CALCIUM: 9.7 mg/dL (ref 8.9–10.3)
CO2: 22 mmol/L (ref 22–32)
CREATININE: 0.97 mg/dL (ref 0.44–1.00)
Chloride: 104 mmol/L (ref 98–111)
GFR calc Af Amer: >60 mL/min (ref >60)
GFR calc non Af Amer: >60 mL/min (ref >60)
GLUCOSE: 95 mg/dL (ref 70–99)
Potassium: 3.4 mmol/L — ABNORMAL LOW (ref 3.5–5.1)
Sodium: 141 mmol/L (ref 135–145)
TOTAL PROTEIN: 8.2 g/dL — AB (ref 6.5–8.1)

## 2018-04-25 LAB — CBC
HCT: 45.5 % (ref 36.0–46.0)
HEMOGLOBIN: 15 g/dL (ref 12.0–15.0)
MCH: 29.2 pg (ref 26.0–34.0)
MCHC: 33 g/dL (ref 30.0–36.0)
MCV: 88.7 fL (ref 78.0–100.0)
PLATELETS: 351 10*3/uL (ref 150–400)
RBC: 5.13 MIL/uL — ABNORMAL HIGH (ref 3.87–5.11)
RDW: 13.9 % (ref 11.5–15.5)
WBC: 9 10*3/uL (ref 4.0–10.5)

## 2018-04-25 LAB — RAPID URINE DRUG SCREEN, HOSP PERFORMED
Amphetamines: NOT DETECTED
Barbiturates: NOT DETECTED
Benzodiazepines: NOT DETECTED
Cocaine: NOT DETECTED
TETRAHYDROCANNABINOL: POSITIVE — AB

## 2018-04-25 LAB — HCG, QUANTITATIVE, PREGNANCY: hCG, Beta Chain, Quant, S: <1 m[IU]/mL (ref <5)

## 2018-04-25 LAB — ETHANOL: Alcohol, Ethyl (B): 23 mg/dL — ABNORMAL HIGH (ref <10)

## 2018-04-25 MED ORDER — HYDROXYZINE HCL 25 MG PO TABS
25.0000 mg | ORAL_TABLET | Freq: Three times a day (TID) | ORAL | Status: DC | PRN
  Administered 2018-04-25 – 2018-04-26 (×2): 25 mg via ORAL
  Filled 2018-04-25 (×2): qty 1

## 2018-04-25 MED ORDER — HALOPERIDOL 5 MG PO TABS
5.0000 mg | ORAL_TABLET | Freq: Once | ORAL | Status: AC
Start: 2018-04-25 — End: 2018-04-25
  Administered 2018-04-25: 5 mg via ORAL
  Filled 2018-04-25: qty 1

## 2018-04-25 NOTE — ED Provider Notes (Signed)
[facility]-EMERGENCY DEPT Provider Note   CSN: [id number] Arrival date & time: 04/25/18  1719     History   Chief Complaint Chief Complaint  Patient presents with  . Manic Behavior    HPI Stacy Poole is a 27 y.o. female.  The history is provided by the patient.  Mental Health Problem  Presenting symptoms: bizarre behavior, disorganized speech, disorganized thought process and hallucinations   Patient accompanied by:  Caregiver Degree of incapacity (severity):  Severe Onset quality:  Gradual Timing:  Constant Progression:  Worsening Chronicity:  Chronic Context: noncompliance   Treatment compliance:  Untreated Associated symptoms: distractible and hypersomnia   Associated symptoms: no abdominal pain and no chest pain   Risk factors: hx of mental illness (schizoaffective)     Past Medical History:  Diagnosis Date  . Acute ear infection   . Anxiety   . Arachnoid cyst   . Fifth disease   . Insomnia   . Mental disorder   . Mononucleosis   . PTSD (post-traumatic stress disorder)   . Substance abuse [facility](HCC)     Patient Active Problem List   Diagnosis Date Noted  . 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 Alcohol abuse with alcohol-induced mood disorder (HCC) 09/27/2017  . Alcohol use disorder, moderate, dependence (HCC) 11/15/2015  . Cannabis use disorder, severe, dependence (HCC) 11/15/2015    No past surgical history on file.   OB History   None      Home Medications    Prior to Admission medications   Medication Sig Start Date End Date Taking? Authorizing Provider  CANNABIDIOL PO Take by mouth.    Yes [provider]  CANNABIDIOL PO Take by mouth.   Yes [provider]  benztropine (COGENTIN) 0.5 MG tablet Take 0.5 mg by mouth 2 (two) times daily. 12/07/17   [provider]  Coenzyme Q10 (COQ-10 PO) Take 1 tablet by mouth daily.    [provider]  haloperidol  (HALDOL) 0.5 MG tablet Take 0.5 mg by mouth 2 (two) times daily. 12/07/17   [provider]  hydrOXYzine (ATARAX/VISTARIL) 25 MG tablet Take 3 tabs (75 mg) in the morning and 3 tabs (75 mg) at bedtime. Patient not taking: Reported on 04/25/2018 11/18/15   Adonis BrookAgustin, Sheila, NP  LORazepam (ATIVAN) 1 MG tablet Take 1 mg by mouth 3 (three) times daily as needed for anxiety. 11/01/17   [provider]    Family History No family history on file.  Social History Social History   Tobacco Use  . Smoking status: Current Every Day Smoker    Packs/day: 0.50    Types: Cigarettes  . Smokeless tobacco: Never Used  Substance Use Topics  . Alcohol use: Yes    Alcohol/week: 10.0 standard drinks    Types: 10 Shots of liquor per week    Comment: daily  . Drug use: No    Types: Codeine, Benzodiazepines, Other-see comments    Comment: narcotics last used 09/25/11 0100,      Allergies   Tetracyclines & related   Review of Systems Review of Systems  Constitutional: Negative for chills and fever.  HENT: Negative for ear pain and sore throat.   Eyes: Negative for pain and visual disturbance.  Respiratory: Negative for cough and shortness of breath.   Cardiovascular: Negative for chest pain and palpitations.  Gastrointestinal: Negative for abdominal pain and vomiting.  Genitourinary: Negative for dysuria and hematuria.  Musculoskeletal: Negative for arthralgias and  back pain.  Skin: Negative for color change and rash.  Neurological: Negative for seizures and syncope.  Psychiatric/Behavioral: Positive for hallucinations.  All other systems reviewed and are negative.    Physical Exam Updated Vital Signs  ED Triage Vitals  Enc Vitals Group     BP 04/25/18 1743 (!) 151/103     Pulse Rate 04/25/18 1743 (!) 118     Resp 04/25/18 1743 18     Temp 04/25/18 1743 98.8 F (37.1 C)     Temp Source 04/25/18 1743 Oral     SpO2 04/25/18 1743 100 %     Weight 04/25/18 1739 125 lb (56.7  kg)     Height 04/25/18 1739 5\' 3"  (1.6 m)     Head Circumference --      Peak Flow --      Pain Score 04/25/18 1739 0     Pain Loc --      Pain Edu? --      Excl. in GC? --     Physical Exam  Constitutional: She appears well-developed and well-nourished. No distress.  HENT:  Head: Normocephalic and atraumatic.  Eyes: Pupils are equal, round, and reactive to light. Conjunctivae and EOM are normal.  Neck: Normal range of motion. Neck supple.  Cardiovascular: Normal rate, regular rhythm, normal heart sounds and intact distal pulses.  No murmur heard. Pulmonary/Chest: Effort normal and breath sounds normal. No respiratory distress.  Abdominal: Soft. There is no tenderness.  Musculoskeletal: She exhibits no edema.  Neurological: She is alert.  Skin: Skin is warm and dry. Capillary refill takes less than 2 seconds.  Psychiatric: Her affect is labile. Her speech is rapid and/or pressured. She is actively hallucinating. Thought content is paranoid.  Nursing note and vitals reviewed.    ED Treatments / Results  Labs (all labs ordered are listed, but only abnormal results are displayed) Labs Reviewed  COMPREHENSIVE METABOLIC PANEL - Abnormal; Notable for the following components:      Result Value   Potassium 3.4 (*)    Total Protein 8.2 (*)    All other components within normal limits  ETHANOL - Abnormal; Notable for the following components:   Alcohol, Ethyl (B) 23 (*)    All other components within normal limits  CBC - Abnormal; Notable for the following components:   RBC 5.13 (*)    All other components within normal limits  RAPID URINE DRUG SCREEN, HOSP PERFORMED - Abnormal; Notable for the following components:   Opiates   (*)    Value: Result not available. Reagent lot number recalled by manufacturer.   Tetrahydrocannabinol POSITIVE (*)    All other components within normal limits  HCG, QUANTITATIVE, PREGNANCY    EKG None  Radiology No results  found.  Procedures Procedures (including critical care time)  Medications Ordered in ED Medications  hydrOXYzine (ATARAX/VISTARIL) tablet 25 mg (has no administration in time range)     Initial Impression / Assessment and Plan / ED Course  I have reviewed the triage vital signs and the nursing notes.  Pertinent labs & imaging results that were available during my care of the patient were reviewed by me and considered in my medical decision making (see chart for details).     BERMA HARTS is a 27 year old female history of schizoaffective disorder who presents to the ED with mania.  Patient with unremarkable vitals.  No fever.  Patient voluntarily here as she has had increased hallucinations here.  Mother states that  patient has gotten worse over the last several days.  Patient is no longer on any medications.  Denies any drug use or alcohol use.  Patient with no pain on exam.  Overall patient exhibits manic behavior upon interview.  Medical clearance labs are ordered and were overall unremarkable.  No significant anemia, lateral abnormality, kidney injury.  Behavioral health recommends inpatient treatment.  Patient remained hemodynamically stable throughout my care and awaiting final psychiatric disposition. IVC placed.   Final Clinical Impressions(s) / ED Diagnoses   Final diagnoses:  Manic behavior [facility])    ED Discharge Orders    None       Virgina Norfolk, DO 04/25/18 1954

## 2018-04-25 NOTE — ED Notes (Signed)
Pt in room sitting quietly. During assessment reports visual and auditory hallucinations. Pt appears to be responding to internal stimuli as well. Pt denies SI or HI and states the hallucinations are "just of random things" and "do not bother her". Pt occasionally giggles while looking around the room. Pt denies needs. Pt contracts for safety. Will continue to monitor.

## 2018-04-25 NOTE — BH Assessment (Signed)
Tele Assessment Note   Patient Name: Stacy Poole MRN: [id number] Referring Physician: Virgina Norfolk, DO Location of Patient:  [facility] Location of Provider: Behavioral Health TTS Department  Stacy Poole is an 27 y.o. female present to [facility] with auditory / visual hallucinations. As soon as clinician walked in the room patient sing, "I need some psychotic medications." Patient sing her  All responses. Report she has been expenses auditory / visual hallucinations past 3 days. Report she lives with her mother, see Dr. Marlyne Beards for medication management and Line Matkinson for therapist. Last visit for medication management 2 weeks ago. At times patient was speaking in a tangential language. Patient responding in internal stimuli. Report history of PTSD due to living in [place]. Patient denies suicidal / homicidal ideations.        Diagnosis:  F25.1  Schizoaffective disorder, Depressive type  Past Medical History:  Past Medical History:  Diagnosis Date  . Acute ear infection   . Anxiety   . Arachnoid cyst   . Fifth disease   . Insomnia   . Mental disorder   . Mononucleosis   . PTSD (post-traumatic stress disorder)   . Substance abuse (HCC)     No past surgical history on file.  Family History: No family history on file.  Social History:  reports that she has been smoking cigarettes. She has been smoking about 0.50 packs per day. She has never used smokeless tobacco. She reports that she drinks about 10.0 standard drinks of alcohol per week. She reports that she does not use drugs.  Additional Social History:  Alcohol / Drug Use Pain Medications: See MAR Prescriptions: See MAR Over the Counter: See MAR History of alcohol / drug use?: Yes Substance #1 Name of Substance 1: Cocaine  1 - Age of First Use: unknown  1 - Amount (size/oz): unknown  1 - Frequency: unknown  1 - Duration: unknown  1 - Last Use / Amount: UDS's positive for cocaine   CIWA: CIWA-Ar BP: (!)  151/103 Pulse Rate: (!) 118 COWS:    Allergies:  Allergies  Allergen Reactions  . Tetracyclines & Related Other (See Comments)    Ear popping, couldn't move neck/back, and had blind spots/double vision    Home Medications:  (Not in a hospital admission)  OB/GYN Status:  No LMP recorded.  General Assessment Data Location of Assessment: [facility] TTS Assessment: In system Is this a Tele or Face-to-Face Assessment?: Face-to-Face Is this an Initial Assessment or a Re-assessment for this encounter?: Initial Assessment Marital status: Single Maiden name: n/a Is patient pregnant?: No Pregnancy Status: No Living Arrangements: Parent(report lives with her mother ) Can pt return to current living arrangement?: Yes Admission Status: Voluntary Is patient capable of signing voluntary admission?: No Referral Source: Self/Family/Friend Insurance type: BCBS      Crisis Care Plan Living Arrangements: Parent(report lives with her mother ) Legal Guardian: Other:(self ) Name of Psychiatrist: Dr. Marlyne Beards  Name of Therapist: Therapist Adelene Amas   Education Status Is patient currently in school?: No Is the patient employed, unemployed or receiving disability?: Unemployed  Risk to self with the past 6 months Suicidal Ideation: No Has patient been a risk to self within the past 6 months prior to admission? : No Suicidal Intent: No Has patient had any suicidal intent within the past 6 months prior to admission? : No Is patient at risk for suicide?: No Suicidal Plan?: No Has patient had any suicidal plan within the past 6 months  prior to admission? : No Access to Means: No What has been your use of drugs/alcohol within the last 12 months?:  cocaine  Previous Attempts/Gestures: No How many times?: 0 Other Self Harm Risks: none report  Triggers for Past Attempts: Other (Comment)(substance use) Intentional Self Injurious Behavior: None Family Suicide History: No Recent stressful life  event(s): Other (Comment)(none report ) Persecutory voices/beliefs?: Yes Depression: Yes Depression Symptoms: Insomnia, Feeling worthless/self pity, Loss of interest in usual pleasures Substance abuse history and/or treatment for substance abuse?: No Suicide prevention information given to non-admitted patients: Not applicable  Risk to Others within the past 6 months Homicidal Ideation: No Does patient have any lifetime risk of violence toward others beyond the six months prior to admission? : No Thoughts of Harm to Others: No Current Homicidal Intent: No Current Homicidal Plan: No Access to Homicidal Means: No Identified Victim: n/a History of harm to others?: No Assessment of Violence: None Noted Violent Behavior Description: None noted Does patient have access to weapons?: No Criminal Charges Pending?: No Does patient have a court date: No Is patient on probation?: No  Psychosis Hallucinations: Auditory, Visual Delusions: None noted  Mental Status Report Appearance/Hygiene: In scrubs Eye Contact: Good Motor Activity: Restlessness Speech: Word salad, Tangential Level of Consciousness: Alert Mood: Pleasant Affect: Preoccupied Anxiety Level: None Thought Processes: Tangential Judgement: Impaired Orientation: Person, Place, Time, Situation Obsessive Compulsive Thoughts/Behaviors: None  Cognitive Functioning Concentration: Normal Memory: Recent Impaired, Remote Impaired Is patient IDD: No Is patient DD?: No Insight: Poor Impulse Control: Poor Appetite: Poor Have you had any weight changes? : No Change Sleep: Decreased Total Hours of Sleep: (report has not slept ) Vegetative Symptoms: None  ADLScreening [facility] Assessment Services) Patient's cognitive ability adequate to safely complete daily activities?: Yes Patient able to express need for assistance with ADLs?: Yes Independently performs ADLs?: Yes (appropriate for developmental age)  Prior Inpatient  Therapy Prior Inpatient Therapy: Yes Prior Therapy Dates: 11/2015 Prior Therapy Facilty/Provider(s): [facility] Reason for Treatment: mental health   Prior Outpatient Therapy Prior Outpatient Therapy: Yes Does patient have an ACCT team?: No Does patient have Intensive In-House Services?  : No Does patient have [facility] services? : No Does patient have P4CC services?: No  ADL Screening (condition at time of admission) Patient's cognitive ability adequate to safely complete daily activities?: Yes Is the patient deaf or have difficulty hearing?: No Does the patient have difficulty seeing, even when wearing glasses/contacts?: No Does the patient have difficulty concentrating, remembering, or making decisions?: No Patient able to express need for assistance with ADLs?: Yes Does the patient have difficulty dressing or bathing?: No Independently performs ADLs?: Yes (appropriate for developmental age) Does the patient have difficulty walking or climbing stairs?: No       Abuse/Neglect Assessment (Assessment to be complete while patient is alone) Abuse/Neglect Assessment Can Be Completed: Unable to assess, patient is non-responsive or altered mental status     Advance Directives (For Healthcare) Does Patient Have a Medical Advance Directive?: No Would patient like information on creating a medical advance directive?: No - Patient declined          Disposition:  Disposition Initial Assessment Completed for this Encounter: Yes(Jamison Lord, DNP, recommend inpatient tx )  This service was provided via telemedicine using a 2-way, interactive audio and Immunologist.  Names of all persons participating in this telemedicine service and their role in this encounter. Name:  Stacy Poole  Role: patient   Name:   Dian Situ Role:  Assessor   Name:  Role:   Name:  Role:     Dian SituDelvondria Zaiden Ludlum 04/25/2018 7:12 PM

## 2018-04-25 NOTE — ED Triage Notes (Addendum)
Pt to ed by POV from home with complaints of having "a psychiatric break". Pt denies wanting to harm self or anyone else. Pt states that [facility], which from family states that this is not correct. Pt family states that she is living with her mother. Pt family states pt has a drinking problem, but patient denies. Pt is rambling about Trump, white supremacy, and lots of jumbling of her words and thoughts. Pt sings her answers to this nurse. Pt asked parent to bring her to the hospital because she needs help.

## 2018-04-26 DIAGNOSIS — F3010 Manic episode without psychotic symptoms, unspecified: Secondary | ICD-10-CM

## 2018-04-26 DIAGNOSIS — F1014 Alcohol abuse with alcohol-induced mood disorder: Secondary | ICD-10-CM

## 2018-04-26 MED ORDER — ACETAMINOPHEN 325 MG PO TABS
650.0000 mg | ORAL_TABLET | Freq: Once | ORAL | Status: AC
  Administered 2018-04-26: 650 mg via ORAL
  Filled 2018-04-26: qty 2
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
## 2018-04-26 NOTE — ED Notes (Signed)
Educated on discharge instructions, follow up, and has all belongings.

## 2018-04-26 NOTE — BH Assessment (Signed)
[facility]  Per Juanetta BeetsJacqueline Norman, DO, this pt does not require psychiatric hospitalization at this time.  Pt presents under IVC initiated by EDP Virgina NorfolkAdam Curatolo, DO, which Dr Sharma CovertNorman has rescinded.  Pt is to be discharged from [facility] with recommendation to continue treatment with [facility]enn [facility], pt's regular outpatient provider.  This has been included in pt's discharge instructions.  Pt's nurse, Angelique BlonderDenise, has been notified.  Doylene Canninghomas Fumiko Cham, MA Triage Specialist [phone]

## 2018-04-26 NOTE — Discharge Instructions (Signed)
For your behavioral health needs, you are advised to continue treatment with Beverly MilchGlenn Jennings, MD at [facility]:       Beverly MilchGlenn Jennings, MD      [facility]      [address]      [place], [place] [address]      [phone]

## 2018-04-26 NOTE — ED Notes (Signed)
Patient denies pain and is resting comfortably.  

## 2018-04-26 NOTE — ED Notes (Addendum)
Pt has come to nurse station stating she feels like she is being tortured because her thoughts are still racing and her head still hurts. Pt was given PRN vistaril along with tylenol and [facility]. Pt states she thinks she might have taken too much CBD prior to arrival. Pt had two shirts on under her scrub top. Pt was also wearing a necklace. These items were removed and added to pt's locker.

## 2018-05-05 ENCOUNTER — Emergency Department (HOSPITAL_COMMUNITY)
Admission: EM | Admit: 2018-05-05 | Discharge: 2018-05-06 | Disposition: A | Discharge status: Other Acute Inpatient Hospital | Payer: BLUE CROSS/BLUE SHIELD | Attending: Emergency Medicine | Admitting: Emergency Medicine

## 2018-05-05 DIAGNOSIS — F17210 Nicotine dependence, cigarettes, uncomplicated: Secondary | ICD-10-CM | POA: Insufficient documentation

## 2018-05-05 DIAGNOSIS — F250 Schizoaffective disorder, bipolar type: Secondary | ICD-10-CM | POA: Diagnosis present

## 2018-05-05 DIAGNOSIS — Z79899 Other long term (current) drug therapy: Secondary | ICD-10-CM | POA: Diagnosis not present

## 2018-05-05 DIAGNOSIS — R443 Hallucinations, unspecified: Secondary | ICD-10-CM

## 2018-05-05 LAB — I-STAT BETA HCG BLOOD, ED (MC, WL, AP ONLY): I-stat hCG, quantitative: <5 m[IU]/mL (ref <5)

## 2018-05-05 LAB — COMPREHENSIVE METABOLIC PANEL
ALK PHOS: 38 U/L (ref 38–126)
ALT: 15 U/L (ref 0–44)
ANION GAP: 9 (ref 5–15)
AST: 19 U/L (ref 15–41)
Albumin: 4.3 g/dL (ref 3.5–5.0)
BUN: 8 mg/dL (ref 6–20)
CALCIUM: 9.1 mg/dL (ref 8.9–10.3)
CO2: 24 mmol/L (ref 22–32)
Chloride: 101 mmol/L (ref 98–111)
Creatinine, Ser: 0.73 mg/dL (ref 0.44–1.00)
GFR calc Af Amer: >60 mL/min (ref >60)
GFR calc non Af Amer: >60 mL/min (ref >60)
Glucose, Bld: 89 mg/dL (ref 70–99)
Potassium: 3.7 mmol/L (ref 3.5–5.1)
Sodium: 134 mmol/L — ABNORMAL LOW (ref 135–145)
TOTAL PROTEIN: 7.2 g/dL (ref 6.5–8.1)
Total Bilirubin: 0.5 mg/dL (ref 0.3–1.2)

## 2018-05-05 LAB — CBC
HEMATOCRIT: 42.4 % (ref 36.0–46.0)
Hemoglobin: 14.4 g/dL (ref 12.0–15.0)
MCH: 29.4 pg (ref 26.0–34.0)
MCHC: 34 g/dL (ref 30.0–36.0)
MCV: 86.7 fL (ref 78.0–100.0)
Platelets: 314 10*3/uL (ref 150–400)
RBC: 4.89 MIL/uL (ref 3.87–5.11)
RDW: 13.7 % (ref 11.5–15.5)
WBC: 7.6 10*3/uL (ref 4.0–10.5)

## 2018-05-05 LAB — RAPID URINE DRUG SCREEN, HOSP PERFORMED
Amphetamines: NOT DETECTED
BARBITURATES: NOT DETECTED
BENZODIAZEPINES: POSITIVE — AB
COCAINE: NOT DETECTED
OPIATES: NOT DETECTED
TETRAHYDROCANNABINOL: NOT DETECTED

## 2018-05-05 LAB — SALICYLATE LEVEL

## 2018-05-05 LAB — ETHANOL: Alcohol, Ethyl (B): <10 mg/dL (ref <10)

## 2018-05-05 LAB — ACETAMINOPHEN LEVEL

## 2018-05-05 MED ORDER — ZIPRASIDONE MESYLATE 20 MG IM SOLR
20.0000 mg | Freq: Once | INTRAMUSCULAR | Status: AC
  Administered 2018-05-05: 20 mg via INTRAMUSCULAR
  Filled 2018-05-05: qty 20

## 2018-05-05 MED ORDER — LORAZEPAM 2 MG/ML IJ SOLN
2.0000 mg | Freq: Once | INTRAMUSCULAR | Status: AC
  Administered 2018-05-05: 2 mg via INTRAMUSCULAR
  Filled 2018-05-05: qty 1

## 2018-05-05 MED ORDER — STERILE WATER FOR INJECTION IJ SOLN
INTRAMUSCULAR | Status: AC
  Administered 2018-05-05: 10 mL
  Filled 2018-05-05: qty 10

## 2018-05-05 MED ORDER — DIPHENHYDRAMINE HCL 50 MG/ML IJ SOLN
50.0000 mg | Freq: Once | INTRAMUSCULAR | Status: AC
  Administered 2018-05-05: 50 mg via INTRAMUSCULAR
  Filled 2018-05-05: qty 1

## 2018-05-05 NOTE — ED Triage Notes (Signed)
Patient presents under IVC by her Mother with [employer]-patient hearing voices-talking to voices-speaking to this Clinical research associate in a singsong voice and will not answer specific questions asked. Patient states to this Clinical research associate in a singsong voice "Fuck you bitch, I just want to go to [place]". Poor eye contact. Patient is dressed in odd layers with apron and knee high soccer apparel with suede boots with fringe. Patient has glitter covering her entire face and has large amounts of jewelry on. Patient is swinging socks around-unable to sit still on stretcher. Patient is rambling about "Trump" and that "I have the same birthday as Fay Records:. IVC paperwork states patient is schizophrenic and is not taking her medications. Patient will not answer whether she feels suicidal-just sings to this writer-"Fuck you bitch".

## 2018-05-05 NOTE — ED Notes (Signed)
Pt received not changed out. Yelling and cussing still wearing street clothes under her scrubs. Orders received for IM medication due to pt loss of rationality and

## 2018-05-05 NOTE — ED Notes (Addendum)
Pt showered at pt request, tearful and upset.  Pt then asked for extra blankets and went to bed. Up in restroom standing at sink.

## 2018-05-05 NOTE — ED Triage Notes (Signed)
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 Patient was compliant in obtaining a urine specimen and changing in to scrubs. Face cleaned of most of the glitter.

## 2018-05-05 NOTE — ED Notes (Signed)
Report to [facility]  in Acute psych

## 2018-05-05 NOTE — ED Notes (Signed)
Patient wanded by security and to Secure area  Room 36 with [employer] and security

## 2018-05-05 NOTE — ED Notes (Signed)
Bed: [facility] Expected date:  Expected time:  Means of arrival:  Comments: [employer]- IVC 

## 2018-05-05 NOTE — BHH Counselor (Signed)
Clinician attempted to assess pt however pt could not be roused. Per Brett Canales, RN upon entering [facility] pt had delusional thought content (CIA, ISIS, hostages), pt jumped on the counter near the phone, yelling, cursing, paranoia, and received Benadryl 50 mg, Geodon 20 mg and Ativan 2 mg at 8:20 PM. Clinician to assess once pt is roused. Discussed with Lillia Abed, [place].  Redmond Pulling, MS, [facility], [facility] Triage Specialist [phone]

## 2018-05-05 NOTE — ED Notes (Signed)
Bed: [facility] Expected date:  Expected time:  Means of arrival:  Comments: [place]

## 2018-05-05 NOTE — ED Notes (Signed)
Patient yelling out "See what I am fucking dealing with"-Patient has her head covered because she is in the hallway. Patient states "get me out of the fucking hallway.

## 2018-05-05 NOTE — ED Provider Notes (Signed)
[facility]   CSN: [id number] Arrival date & time: 05/05/18  1916     History   Chief Complaint No chief complaint on file.   HPI Stacy Poole is a 27 y.o. female brought in by [facility] for evaluations of hallucinations, aggressive behavior.  Patient was placed in IVC by her mother who she lives with.  Mother told her Police Department that over the last few days, patient has been having hallucinations.  Patient told her mom that "she was working for [employer] to help bring down Trump."  Mom also states that patient had been very verbally aggressive to her.  Mom states that patient had made several statements about wanting to kill herself.  Mom states that patient had put CBD cream in her eyes because she "needed to protect them from the bad things" and had been taking CBD pills.  Mom also states that patient had been drinking baking vanilla because she told her "she wanted to get drunk."  Patient has a history of schizoaffective disorder.  Mom states she has a long-standing history of not going to her psychiatric appointment.  Patient was recently seen in ED on 04/25/2018 for hallucinations.  At that time, she was discharged.  She was supposed to follow-up with her outpatient therapist but mom states she never did.  On ED arrival, patient is seen eating hand sanitizer from the wall.  Patient is responding to all her answers by singing.  When asked about any suicidal ideation, patient states "fuck yeah I want to kill myself."  Patient states that she is seeing "cupcake sprinkles and butterflies through the air."   The history is provided by the patient.    Past Medical History:  Diagnosis Date  . Acute ear infection   . Anxiety   . Arachnoid cyst   . Fifth disease   . Insomnia   . Mental disorder   . Mononucleosis   . PTSD (post-traumatic stress disorder)   . Substance abuse [facility])     Patient Active Problem List   Diagnosis Date  Noted  . 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 Alcohol abuse with alcohol-induced mood disorder (HCC) 09/27/2017  . Alcohol use disorder, moderate, dependence (HCC) 11/15/2015  . Cannabis use disorder, severe, dependence (HCC) 11/15/2015    No past surgical history on file.   OB History   None      Home Medications    Prior to Admission medications   Medication Sig Start Date End Date Taking? Authorizing Provider  benztropine (COGENTIN) 0.5 MG tablet Take 0.5 mg by mouth 2 (two) times daily. 12/07/17  Yes [provider]  Coenzyme Q10 (COQ-10 PO) Take 1 tablet by mouth daily.   Yes [provider]  haloperidol (HALDOL) 0.5 MG tablet Take 0.5 mg by mouth 2 (two) times daily. 12/07/17  Yes [provider]  hydrOXYzine (ATARAX/VISTARIL) 25 MG tablet Take 3 tabs (75 mg) in the morning and 3 tabs (75 mg) at bedtime. Patient not taking: Reported on 04/25/2018 11/18/15   Adonis Brook, NP    Family History No family history on file.  Social History Social History   Tobacco Use  . Smoking status: Current Every Day Smoker    Packs/day: 0.50    Types: Cigarettes  . Smokeless tobacco: Never Used  Substance Use Topics  . Alcohol use: Yes    Alcohol/week: 10.0 standard drinks    Types: 10 Shots of liquor per  week    Comment: daily  . Drug use: No    Types: Codeine, Benzodiazepines, Other-see comments    Comment: narcotics last used 09/25/11 0100,      Allergies   Tetracyclines & related   Review of Systems Review of Systems  Psychiatric/Behavioral: Positive for agitation and hallucinations.  All other systems reviewed and are negative.    Physical Exam Updated Vital Signs BP (!) 144/110 (BP Location: Left Arm)   Pulse 100   Temp 98.2 F (36.8 C) (Oral)   Resp 18   SpO2 100%   Physical Exam  Constitutional: She is oriented to person, place, and time. She appears well-developed and well-nourished.  HENT:  Head:  Normocephalic and atraumatic.  Mouth/Throat: Oropharynx is clear and moist and mucous membranes are normal.  Eyes: Pupils are equal, round, and reactive to light. Conjunctivae, EOM and lids are normal.  Neck: Full passive range of motion without pain.  Cardiovascular: Normal rate, regular rhythm, normal heart sounds and normal pulses. Exam reveals no gallop and no friction rub.  No murmur heard. Pulmonary/Chest: Effort normal and breath sounds normal.  Abdominal: Soft. Normal appearance. There is no tenderness. There is no rigidity and no guarding.  Musculoskeletal: Normal range of motion.  Neurological: She is alert and oriented to person, place, and time.  Skin: Skin is warm and dry. Capillary refill takes less than 2 seconds.  Psychiatric: Her affect is labile. Her speech is rapid and/or pressured. She is actively hallucinating. She expresses suicidal ideation. She expresses no suicidal plans and no homicidal plans.  Patient is singing all of her answers.  She will intermittently go outbursts of agitation. When asked where she is, she states "the fucking [employer]."   Nursing note and vitals reviewed.    ED Treatments / Results  Labs (all labs ordered are listed, but only abnormal results are displayed) Labs Reviewed  COMPREHENSIVE METABOLIC PANEL - Abnormal; Notable for the following components:      Result Value   Sodium 134 (*)    All other components within normal limits  ACETAMINOPHEN LEVEL - Abnormal; Notable for the following components:   Acetaminophen (Tylenol), Serum <10 (*)    All other components within normal limits  RAPID URINE DRUG SCREEN, HOSP PERFORMED - Abnormal; Notable for the following components:   Benzodiazepines POSITIVE (*)    All other components within normal limits  ETHANOL  SALICYLATE LEVEL  CBC  I-STAT BETA HCG BLOOD, ED ([facility], [facility], [facility] ONLY)    EKG None  Radiology No results found.  Procedures Procedures (including critical care  time)  Medications Ordered in ED Medications  LORazepam (ATIVAN) injection 2 mg (2 mg Intramuscular Given 05/05/18 2020)  ziprasidone (GEODON) injection 20 mg (20 mg Intramuscular Given 05/05/18 2019)  diphenhydrAMINE (BENADRYL) injection 50 mg (50 mg Intramuscular Given 05/05/18 2020)  sterile water (preservative free) injection (10 mLs  Given 05/05/18 2019)     Initial Impression / Assessment and Plan / ED Course  I have reviewed the triage vital signs and the nursing notes.  Pertinent labs & imaging results that were available during my care of the patient were reviewed by me and considered in my medical decision making (see chart for details).     27 y.o. F with PMH/o schizoaffective disorder who presents for evaluation of hallucinations and IVC by mom. Mom reports bizarre thinking, hallucinations and expressions of SI. Patient is afebrile, non-toxic appearing, sitting comfortably on examination table. Vital signs reviewed and stable.  Plan to check medical clearance labs, TTS consult.   I-STAT beta negative.  Acetaminophen level, salicylate level, ethanol unremarkable.  CBC without any significant leukocytosis, anemia.  CMP unremarkable.  UDS is positive for benzo. Patient is medically cleared.   Discussed with Thurston Pounds [facility]).  She attempted to talk to patient but patient was sleeping after receiving Geodon here in the ED.  She will plan to try again once patient is more alert.  Meanwhile, patient will stay in the ED for observation. Anticipate admission.   Final Clinical Impressions(s) / ED Diagnoses   Final diagnoses:  Hallucinations    ED Discharge Orders    None       Rosana Hoes 05/05/18 2338    Gwyneth Sprout, MD 05/06/18 [phone]

## 2018-05-06 ENCOUNTER — Other Ambulatory Visit: Payer: Self-pay

## 2018-05-06 ENCOUNTER — Encounter: Payer: Self-pay | Admitting: Psychiatry

## 2018-05-06 ENCOUNTER — Inpatient Hospital Stay
Admission: AD | Admit: 2018-05-06 | Discharge: 2018-05-14 | DRG: 885 | Disposition: A | Discharge status: Home / Self Care | Payer: BLUE CROSS/BLUE SHIELD | Attending: Psychiatry | Admitting: Psychiatry

## 2018-05-06 DIAGNOSIS — F15951 Other stimulant use, unspecified with stimulant-induced psychotic disorder with hallucinations: Secondary | ICD-10-CM | POA: Diagnosis present

## 2018-05-06 DIAGNOSIS — G4700 Insomnia, unspecified: Secondary | ICD-10-CM | POA: Diagnosis present

## 2018-05-06 DIAGNOSIS — F17200 Nicotine dependence, unspecified, uncomplicated: Secondary | ICD-10-CM | POA: Diagnosis present

## 2018-05-06 DIAGNOSIS — F4310 Post-traumatic stress disorder, unspecified: Secondary | ICD-10-CM | POA: Diagnosis present

## 2018-05-06 DIAGNOSIS — F250 Schizoaffective disorder, bipolar type: Secondary | ICD-10-CM | POA: Diagnosis present

## 2018-05-06 DIAGNOSIS — Z5181 Encounter for therapeutic drug level monitoring: Secondary | ICD-10-CM | POA: Diagnosis not present

## 2018-05-06 DIAGNOSIS — F1020 Alcohol dependence, uncomplicated: Secondary | ICD-10-CM | POA: Diagnosis present

## 2018-05-06 DIAGNOSIS — Z79899 Other long term (current) drug therapy: Secondary | ICD-10-CM

## 2018-05-06 DIAGNOSIS — F29 Unspecified psychosis not due to a substance or known physiological condition: Secondary | ICD-10-CM | POA: Diagnosis present

## 2018-05-06 DIAGNOSIS — F1010 Alcohol abuse, uncomplicated: Secondary | ICD-10-CM | POA: Diagnosis present

## 2018-05-06 MED ORDER — ALUM & MAG HYDROXIDE-SIMETH 200-200-20 MG/5ML PO SUSP
30.0000 mL | ORAL | Status: DC | PRN
  Administered 2018-05-12: 30 mL via ORAL

## 2018-05-06 MED ORDER — CHLORDIAZEPOXIDE HCL 25 MG PO CAPS
25.0000 mg | ORAL_CAPSULE | Freq: Four times a day (QID) | ORAL | Status: DC
  Filled 2018-05-06 (×2): qty 1

## 2018-05-06 MED ORDER — MAGNESIUM HYDROXIDE 400 MG/5ML PO SUSP: 30.0000 mL | Freq: Every day | ORAL | Status: DC | PRN

## 2018-05-06 MED ORDER — ASENAPINE MALEATE 5 MG SL SUBL
5.0000 mg | SUBLINGUAL_TABLET | Freq: Two times a day (BID) | SUBLINGUAL | Status: DC
  Administered 2018-05-06: 5 mg via SUBLINGUAL
  Filled 2018-05-06: qty 1

## 2018-05-06 MED ORDER — HYDROXYZINE HCL 25 MG PO TABS
25.0000 mg | ORAL_TABLET | Freq: Three times a day (TID) | ORAL | Status: DC | PRN
  Administered 2018-05-06 – 2018-05-07 (×2): 25 mg via ORAL
  Filled 2018-05-06 (×2): qty 1

## 2018-05-06 MED ORDER — ACETAMINOPHEN 325 MG PO TABS
650.0000 mg | ORAL_TABLET | Freq: Once | ORAL | Status: AC
  Administered 2018-05-06: 650 mg via ORAL
  Filled 2018-05-06: qty 2

## 2018-05-06 MED ORDER — ASENAPINE MALEATE 5 MG SL SUBL
5.0000 mg | SUBLINGUAL_TABLET | Freq: Two times a day (BID) | SUBLINGUAL | Status: DC
  Administered 2018-05-06 – 2018-05-08 (×4): 5 mg via SUBLINGUAL
  Filled 2018-05-06 (×5): qty 1

## 2018-05-06 MED ORDER — HYDROXYZINE HCL 25 MG PO TABS
25.0000 mg | ORAL_TABLET | Freq: Three times a day (TID) | ORAL | Status: DC | PRN
  Administered 2018-05-06: 25 mg via ORAL
  Filled 2018-05-06: qty 1

## 2018-05-06 MED ORDER — TRAZODONE HCL 100 MG PO TABS
100.0000 mg | ORAL_TABLET | Freq: Every evening | ORAL | Status: DC | PRN
  Administered 2018-05-06 – 2018-05-07 (×2): 100 mg via ORAL
  Filled 2018-05-06 (×3): qty 1

## 2018-05-06 MED ORDER — LORAZEPAM 2 MG PO TABS
2.0000 mg | ORAL_TABLET | Freq: Four times a day (QID) | ORAL | Status: DC | PRN
  Administered 2018-05-06 – 2018-05-07 (×2): 2 mg via ORAL
  Filled 2018-05-06 (×2): qty 1

## 2018-05-06 MED ORDER — ACETAMINOPHEN 325 MG PO TABS
650.0000 mg | ORAL_TABLET | Freq: Four times a day (QID) | ORAL | Status: DC | PRN
  Administered 2018-05-06 – 2018-05-08 (×3): 650 mg via ORAL
  Filled 2018-05-06 (×4): qty 2

## 2018-05-06 NOTE — ED Notes (Signed)
Left second message for sheriff.

## 2018-05-06 NOTE — ED Notes (Addendum)
Sheriff called for transport.  No answer, left message.

## 2018-05-06 NOTE — BH Assessment (Addendum)
Assessment Note  Stacy Poole is an 27 y.o. female, who presents involuntary and unaccompanied to [facility]. Clinician asked the pt, "what brought you to the hospital?" Pt reported, "my mom going crazy as usual, I'm so tired, I'm sorry, I can't talk right now I have ice in my mouth." Pt reported, "she treats me like I'm [age] years old, I can't tell her much about anything." Pt reported, she had the police bring her to the hospital to get away from her mother. Pt denies, SI, HI, AVH, self-injurious behaviors and access to weapons.   Pt was IVC'd by her mother. Per IVC paperwork: "Schizoaffective Disorder and Alcohol use Disorder. Refuses to see her psychiatrist. She said she wanted to kill herself. Hears voices and has conversations with people that are not there. Explosive anger. Screaming, swearing and insults anyone that tried talking to her. Today she put CBD cream in her eyes. Respondent stated she did it to protect them from bad things. A few days ago she ate hand sanitizer. In two days she's eaten 15 CBD pills which are 35 mg a piece. Respondent also drinks backing vanilla for the alcohol content."   Pt denies abuse and substance use. Pt's UDS is positive for benzodiazepines. Pt reported, being linked to Dr. Marlyne Beards for medication management and Dr. Marijo File for counseling. Pt reported, she wants to new psychiatrist. Pt has previous inpatient admissions.   Pt presents alert in scrubs with pressured speech. Pt's eye contact was good. Pt's mood was pleasant. Pt's affect was preoccupied. Pt's thought process was coherent/relevant. Pt was oriented x4. Pt's concentration was normal. Pt's insight and impulse control are poor. Pt reported, if discharged she could contract for safety.   Diagnosis: F25.0 Schizoaffective Disorder, Bipolar type.  Past Medical History:  Past Medical History:  Diagnosis Date  . Acute ear infection   . Anxiety   . Arachnoid cyst   . Fifth disease   . Insomnia   .  Mental disorder   . Mononucleosis   . PTSD (post-traumatic stress disorder)   . Substance abuse (HCC)     No past surgical history on file.  Family History: No family history on file.  Social History:  reports that she has been smoking cigarettes. She has been smoking about 0.50 packs per day. She has never used smokeless tobacco. She reports that she drinks about 10.0 standard drinks of alcohol per week. She reports that she does not use drugs.  Additional Social History:  Alcohol / Drug Use Pain Medications: See MAR Prescriptions: See MAR Over the Counter: See MAR History of alcohol / drug use?: Yes(Pt denies. Per UDS.) Substance #1 Name of Substance 1: Benzodiazepines. 1 - Age of First Use: Pt's UDS is positive for benzodiazepines. 1 - Amount (size/oz): UTA 1 - Frequency: UTA 1 - Duration: UTA  CIWA: CIWA-Ar BP: (!) 144/110 Pulse Rate: 100 COWS:    Allergies:  Allergies  Allergen Reactions  . Tetracyclines & Related Other (See Comments)    Ear popping, couldn't move neck/back, and had blind spots/double vision    Home Medications:  (Not in a hospital admission)  OB/GYN Status:  No LMP recorded.  General Assessment Data Assessment unable to be completed: Yes Reason for not completing assessment: Clinician attempted to assess pt however pt could not be roused. Per Brett Canales, RN upon entering [facility] pt had delusional thought content (CIA, ISIS, hostages), pt jumped on the counter near the phone, yelling, cursing, paranoia, and received Benadryl 50 mg,  Geodon 20 mg and Ativan 2 mg at 8:20 PM. Clinician to assess once pt is roused. Discussed with Lillia Abed, [place]. Location of Assessment: [facility] ED TTS Assessment: In system Is this a Tele or Face-to-Face Assessment?: Face-to-Face Is this an Initial Assessment or a Re-assessment for this encounter?: Initial Assessment Patient Accompanied by:: Parent Language Other than English: No Living Arrangements: Other (Comment)(with parent.  ) What gender do you identify as?: Female Marital status: Single Living Arrangements: Parent Can pt return to current living arrangement?: Yes Admission Status: Involuntary Petitioner: Family member Is patient capable of signing voluntary admission?: No Referral Source: Self/Family/Friend Insurance type: BCBS     Crisis Care Plan Living Arrangements: Parent Legal Guardian: Other:(Self. ) Name of Psychiatrist: Dr. Marlyne Beards. Name of Therapist: Dr. Marijo File.  Education Status Is patient currently in school?: Yes Current Grade: UTA, pt studying psychology.  Highest grade of school patient has completed: Pt graduated from high school.  Name of school: [employer]. Contact person: NA IEP information if applicable: NA Is the patient employed, unemployed or receiving disability?: Unemployed  Risk to self with the past 6 months Suicidal Ideation: Yes-Currently Present(Per IVC however pt denies. ) Has patient been a risk to self within the past 6 months prior to admission? : No Suicidal Intent: (UTA) Has patient had any suicidal intent within the past 6 months prior to admission? : No Is patient at risk for suicide?: No Suicidal Plan?: No(Pt denies. ) Has patient had any suicidal plan within the past 6 months prior to admission? : No Access to Means: No What has been your use of drugs/alcohol within the last 12 months?: Benzodiazepines.  Previous Attempts/Gestures: Yes How many times?: 1 Other Self Harm Risks: Pt denies.  Triggers for Past Attempts: Unknown Intentional Self Injurious Behavior: None Family Suicide History: No Recent stressful life event(s): Other (Comment)(Pt denies, stress. ) Persecutory voices/beliefs?: Yes Depression: Yes Depression Symptoms: Insomnia, Feeling angry/irritable Substance abuse history and/or treatment for substance abuse?: No Suicide prevention information given to non-admitted patients: Not applicable  Risk to Others within the past  6 months Homicidal Ideation: No(Pt denies. ) Does patient have any lifetime risk of violence toward others beyond the six months prior to admission? : Yes (comment)(Pt reported, she can get violent if needed. ) Thoughts of Harm to Others: No(Pt denies. ) Current Homicidal Intent: No Current Homicidal Plan: No(Pt denies. ) Access to Homicidal Means: No(Pt denies. ) Identified Victim: NA History of harm to others?: Yes Assessment of Violence: (UTA) Violent Behavior Description: Pt reported, she can get violent if needed.  Does patient have access to weapons?: No(Pt denies. ) Criminal Charges Pending?: No Does patient have a court date: No Is patient on probation?: No  Psychosis Hallucinations: Auditory, Visual(Pt IVC however pt denies. ) Delusions: None noted  Mental Status Report Appearance/Hygiene: In scrubs Eye Contact: Good Motor Activity: Restlessness Speech: Pressured Level of Consciousness: Alert Mood: Pleasant Affect: Preoccupied Anxiety Level: None Thought Processes: Coherent, Relevant Judgement: Impaired Orientation: Person, Place, Time, Situation Obsessive Compulsive Thoughts/Behaviors: None  Cognitive Functioning Concentration: Normal Memory: Recent Impaired, Remote Impaired Is patient IDD: No Insight: Poor Impulse Control: Poor Appetite: Good Sleep: No Change Total Hours of Sleep: 8 Vegetative Symptoms: None  ADLScreening [facility] Assessment Services) Patient's cognitive ability adequate to safely complete daily activities?: Yes Patient able to express need for assistance with ADLs?: Yes Independently performs ADLs?: Yes (appropriate for developmental age)  Prior Inpatient Therapy Prior Inpatient Therapy: Yes(Per chart.) Prior Therapy Dates: Per chart,  11/2017. Prior Therapy Facilty/Provider(s): Per chart, [facility].  Reason for Treatment: Delusional Disorder.   Prior Outpatient Therapy Prior Outpatient Therapy: Yes Prior Therapy Dates: Current.  Prior  Therapy Facilty/Provider(s): Dr. Marlyne Beards and Dr. Marijo File. Reason for Treatment: Medication management and counseling.  Does patient have an ACCT team?: No Does patient have Intensive In-House Services?  : No Does patient have [facility] services? : No Does patient have P4CC services?: No  ADL Screening (condition at time of admission) Patient's cognitive ability adequate to safely complete daily activities?: Yes Is the patient deaf or have difficulty hearing?: No Does the patient have difficulty seeing, even when wearing glasses/contacts?: No Does the patient have difficulty concentrating, remembering, or making decisions?: No Patient able to express need for assistance with ADLs?: Yes Independently performs ADLs?: Yes (appropriate for developmental age) Does the patient have difficulty walking or climbing stairs?: No Weakness of Legs: None Weakness of Arms/Hands: None       Abuse/Neglect Assessment (Assessment to be complete while patient is alone) Abuse/Neglect Assessment Can Be Completed: Yes Physical Abuse: Denies(Pt denies. ) Verbal Abuse: Denies(Pt denies. ) Sexual Abuse: Denies(Pt denies. ) Exploitation of patient/patient's resources: Denies(Pt denies. ) Self-Neglect: Denies(Pt denies. )     Advance Directives (For Healthcare) Does Patient Have a Medical Advance Directive?: No         Disposition: Donell Sievert, PA recommends inpatient treatment. Disposition discussed with Dr. Judd Lien and Darel Hong, RN. TTS to seek placement.    Disposition Initial Assessment Completed for this Encounter: Yes  On Site Evaluation by: Redmond Pulling, MS, LPC, CRC. Reviewed with Physician: Dr. Judd Lien and Donell Sievert, PA.  Redmond Pulling 05/06/2018 4:34 AM   Redmond Pulling, MS, [facility], [facility] Triage Specialist [phone]

## 2018-05-06 NOTE — [facility] Counselor (Signed)
Clinician observed the pt sleeping. TTS assessment to be completed once pt is roused. Discussed with Darel Hong, RN.   Redmond Pulling, MS, [facility], [facility] Triage Specialist [phone]

## 2018-05-06 NOTE — Plan of Care (Signed)
New Admit  Problem: Education: Goal: Knowledge of General Education information will improve Description Including pain rating scale, medication(s)/side effects and non-pharmacologic comfort measures Outcome: Not Progressing   Problem: Education: Goal: Knowledge of [facility] Education information/materials will improve Outcome: Not Progressing Goal: Emotional status will improve Outcome: Not Progressing Goal: Mental status will improve Outcome: Not Progressing Goal: Verbalization of understanding the information provided will improve Outcome: Not Progressing   Problem: Activity: Goal: Interest or engagement in activities will improve Outcome: Not Progressing Goal: Sleeping patterns will improve Outcome: Not Progressing   Problem: Coping: Goal: Ability to verbalize frustrations and anger appropriately will improve Outcome: Not Progressing Goal: Ability to demonstrate self-control will improve Outcome: Not Progressing   Problem: Health Behavior/Discharge Planning: Goal: Identification of resources available to assist in meeting health care needs will improve Outcome: Not Progressing Goal: Compliance with treatment plan for underlying cause of condition will improve Outcome: Not Progressing   Problem: Physical Regulation: Goal: Ability to maintain clinical measurements within normal limits will improve Outcome: Not Progressing   Problem: Safety: Goal: Periods of time without injury will increase Outcome: Not Progressing   Problem: Education: Goal: Will be free of psychotic symptoms Outcome: Not Progressing Goal: Knowledge of the prescribed therapeutic regimen will improve Outcome: Not Progressing   Problem: Health Behavior/Discharge Planning: Goal: Compliance with prescribed medication regimen will improve Outcome: Not Progressing   Problem: Safety: Goal: Ability to redirect hostility and anger into socially appropriate behaviors will improve Outcome:  Not Progressing Goal: Ability to remain free from injury will improve Outcome: Not Progressing   Problem: Education: Goal: Knowledge of disease or condition will improve Outcome: Not Progressing Goal: Understanding of discharge needs will improve Outcome: Not Progressing   Problem: Physical Regulation: Goal: Complications related to the disease process, condition or treatment will be avoided or minimized Outcome: Not Progressing   Problem: Safety: Goal: Ability to remain free from injury will improve Outcome: Not Progressing

## 2018-05-06 NOTE — Tx Team (Signed)
Initial Treatment Plan 05/06/2018 11:07 PM Stacy Poole LPF:[id number]    PATIENT STRESSORS: Marital or family conflict Medication change or noncompliance Substance abuse   PATIENT STRENGTHS: Active sense of humor Average or above average intelligence Capable of independent living Communication skills General fund of knowledge Supportive family/friends   PATIENT IDENTIFIED PROBLEMS: Psychosis 05/06/2018  Substance Use DO 05/06/2018                   DISCHARGE CRITERIA:  Adequate post-discharge living arrangements Improved stabilization in mood, thinking, and/or behavior Motivation to continue treatment in a less acute level of care Verbal commitment to aftercare and medication compliance Withdrawal symptoms are absent or subacute and managed without 24-hour nursing intervention  PRELIMINARY DISCHARGE PLAN: Attend aftercare/continuing care group Attend 12-step recovery group Return to previous living arrangement  PATIENT/FAMILY INVOLVEMENT: This treatment plan has been presented to and reviewed with the patient, Stacy Poole, and/or family member.  The patient and family have been given the opportunity to ask questions and make suggestions.  Galen Manila, RN 05/06/2018, 11:07 PM
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
## 2018-05-06 NOTE — ED Notes (Signed)
Pt took large handful of paper towels in toilet and overflowed, pt then agitated and loud with other patients regarding event.

## 2018-05-06 NOTE — ED Notes (Signed)
Patient c/o soreness at an injection site (left gluteal) from yesterday.  Patient requesting tylenol.  Notifed NP and order obtained.

## 2018-05-06 NOTE — BH Assessment (Addendum)
Patient has been accepted to [facility].  Accepting physician is Dr. Jennet Maduro.  Attending Physician will be Dr. Johnella Moloney.  Patient has been assigned to room 324-A, by [facility] Charge Nurse T'Yawn.  Call report to [phone].  Representative/Transfer Coordinator is Lillia Abed Patient pre-admitted by [facility] Patient Access Marylene Land)   ER Staff Lillia Abed, [facility]) made aware of acceptance.  PLEASE CALL REPORT AT 4PM

## 2018-05-06 NOTE — ED Notes (Signed)
Report called to Marylu Lund at [facility] behavioral health.
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
## 2018-05-06 NOTE — ED Notes (Signed)
Pt tolerated taking medication well. Continues to be agitated in hallway, talking to self and making phone calls.

## 2018-05-07 DIAGNOSIS — F250 Schizoaffective disorder, bipolar type: Principal | ICD-10-CM

## 2018-05-07 LAB — LIPID PANEL
CHOL/HDL RATIO: 2.3 ratio
CHOLESTEROL: 132 mg/dL (ref 0–200)
HDL: 57 mg/dL (ref >40)
LDL Cholesterol: 66 mg/dL (ref 0–99)
TRIGLYCERIDES: 43 mg/dL (ref <150)
VLDL: 9 mg/dL (ref 0–40)

## 2018-05-07 LAB — TSH: TSH: 1.881 u[IU]/mL (ref 0.350–4.500)

## 2018-05-07 LAB — HEMOGLOBIN A1C
Hgb A1c MFr Bld: 5.1 % (ref 4.8–5.6)
Mean Plasma Glucose: 99.67 mg/dL

## 2018-05-07 MED ORDER — ONDANSETRON 4 MG PO TBDP: 4.0000 mg | ORAL_TABLET | Freq: Four times a day (QID) | ORAL | Status: AC | PRN

## 2018-05-07 MED ORDER — LORAZEPAM 1 MG PO TABS: 1.0000 mg | ORAL_TABLET | Freq: Three times a day (TID) | ORAL | Status: DC

## 2018-05-07 MED ORDER — LORAZEPAM 1 MG PO TABS: 1.0000 mg | ORAL_TABLET | Freq: Two times a day (BID) | ORAL | Status: DC

## 2018-05-07 MED ORDER — LORAZEPAM 1 MG PO TABS
1.0000 mg | ORAL_TABLET | Freq: Four times a day (QID) | ORAL | Status: DC
  Administered 2018-05-07: 1 mg via ORAL

## 2018-05-07 MED ORDER — LORAZEPAM 1 MG PO TABS: 1.0000 mg | ORAL_TABLET | Freq: Every day | ORAL | Status: DC

## 2018-05-07 MED ORDER — LOPERAMIDE HCL 2 MG PO CAPS: 2.0000 mg | ORAL_CAPSULE | ORAL | Status: AC | PRN

## 2018-05-07 MED ORDER — LORAZEPAM 1 MG PO TABS: 1.0000 mg | ORAL_TABLET | Freq: Once | ORAL | Status: DC

## 2018-05-07 MED ORDER — LORAZEPAM 1 MG PO TABS
1.0000 mg | ORAL_TABLET | Freq: Four times a day (QID) | ORAL | Status: DC | PRN
  Administered 2018-05-08 – 2018-05-14 (×8): 1 mg via ORAL
  Filled 2018-05-07 (×8): qty 1

## 2018-05-07 MED ORDER — HYDROXYZINE HCL 50 MG PO TABS
50.0000 mg | ORAL_TABLET | Freq: Three times a day (TID) | ORAL | Status: DC | PRN
  Administered 2018-05-10 – 2018-05-13 (×8): 50 mg via ORAL
  Filled 2018-05-07 (×9): qty 1

## 2018-05-07 MED ORDER — HALOPERIDOL 5 MG PO TABS
5.0000 mg | ORAL_TABLET | Freq: Four times a day (QID) | ORAL | Status: DC | PRN
  Administered 2018-05-10: 5 mg via ORAL
  Filled 2018-05-07: qty 1

## 2018-05-07 MED ORDER — LORAZEPAM 1 MG PO TABS
1.0000 mg | ORAL_TABLET | Freq: Four times a day (QID) | ORAL | Status: DC | PRN
  Filled 2018-05-07: qty 1

## 2018-05-07 MED ORDER — HALOPERIDOL LACTATE 5 MG/ML IJ SOLN: 5.0000 mg | Freq: Four times a day (QID) | INTRAMUSCULAR | Status: DC | PRN

## 2018-05-07 NOTE — BHH Group Notes (Signed)
B[facility] Group Notes:  (Nursing/MHT/Case Management/Adjunct)  Date:  05/07/2018  Time:  9:14 PM  Type of Therapy:  Group Therapy  Participation Level:  Active  Participation Quality:  Appropriate  Affect:  Appropriate  Cognitive:  Appropriate  Insight:  Appropriate  Engagement in Group:  Engaged  Modes of Intervention:  Discussion  Summary of Progress/Problems: Jenee reported having a disagreement with her family.  Saniaa stated her goal was to calm down and work on a discharge plan. Shalei stated she wanted to work on a plan to keep her from coming to the hospital when she has a disagreement with her family. Ameelah stated it was good to be here to think about things she normally does not think about. Shevette stated it was a waste of resources to be in the hospital for having family problems. Jinger Neighbors 05/07/2018, 9:14 PM

## 2018-05-07 NOTE — Plan of Care (Signed)
Pt. Is able to contract verbally for safety. Pt. Denies SI/HI. Pt. Needs reinforcement on provided education. Pt. Continues to present grandiose, paranoid, delusional, with active psychosis. Pt. Suspicious of staff and peers. Pt. Refuses librium today, MD notified of this. Pt. Partially complaint, requires redirection for compliance.      Problem: Safety: Goal: Periods of time without injury will increase Outcome: Progressing   Problem: Education: Goal: Knowledge of General Education information will improve Description Including pain rating scale, medication(s)/side effects and non-pharmacologic comfort measures Outcome: Not Progressing   Problem: Education: Goal: Emotional status will improve Outcome: Not Progressing Goal: Mental status will improve Outcome: Not Progressing   Problem: Health Behavior/Discharge Planning: Goal: Compliance with treatment plan for underlying cause of condition will improve Outcome: Not Progressing   Problem: Education: Goal: Will be free of psychotic symptoms Outcome: Not Progressing   Problem: Health Behavior/Discharge Planning: Goal: Compliance with prescribed medication regimen will improve Outcome: Not Progressing
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
## 2018-05-07 NOTE — Progress Notes (Signed)
Recreation Therapy Notes  Date: 05/07/2018  Time: 9:30 am  Location: Craft Room  Behavioral response: Appropriate, Restless  Intervention Topic: Problem Solving  Discussion/Intervention:  Group content on today was focused on problem solving. The group described what problem solving is. Patients expressed how problems affect them and how they deal with problems. Individuals identified healthy ways to deal with problems. Patients explained what normally happens to them when they do not deal with problems. The group expressed reoccurring problems for them. The group participated in the intervention "Put the story together" with their peers and worked together to put a story that was broken up in the correct order. Clinical Observations/Feedback:  Patient came to group and defined problem solving as preventing situations. She walked in and out of group several times due to unknown reasons.  Aden Youngman LRT/CTRS         Tiffaney Heimann 05/07/2018 11:00 AM
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
## 2018-05-08 MED ORDER — ASENAPINE MALEATE 5 MG SL SUBL
10.0000 mg | SUBLINGUAL_TABLET | Freq: Two times a day (BID) | SUBLINGUAL | Status: DC
  Administered 2018-05-08 – 2018-05-13 (×11): 10 mg via SUBLINGUAL
  Filled 2018-05-08 (×12): qty 2

## 2018-05-08 MED ORDER — DIPHENHYDRAMINE HCL 25 MG PO CAPS
50.0000 mg | ORAL_CAPSULE | Freq: Every evening | ORAL | Status: DC | PRN
  Administered 2018-05-08 – 2018-05-13 (×5): 50 mg via ORAL
  Filled 2018-05-08 (×5): qty 2

## 2018-05-08 NOTE — Tx Team (Addendum)
Interdisciplinary Treatment and Diagnostic Plan Update  05/08/2018 Time of Session: 11:00 AM Stacy Poole MRN: [id number]  Principal Diagnosis: Schizoaffective disorder, bipolar type (HCC)  Secondary Diagnoses: Principal Problem:   Schizoaffective disorder, bipolar type (HCC) Active Problems:   Alcohol use disorder, moderate, dependence (HCC)   Tobacco use disorder   Current Medications:  Current Facility-Administered Medications  Medication Dose Route Frequency Provider Last Rate Last Dose  . acetaminophen (TYLENOL) tablet 650 mg  650 mg Oral Q6H PRN Charm Rings, NP   650 mg at 05/08/18 0844  . alum & mag hydroxide-simeth (MAALOX/MYLANTA) 200-200-20 MG/5ML suspension 30 mL  30 mL Oral Q4H PRN Charm Rings, NP      . asenapine (SAPHRIS) sublingual tablet 10 mg  10 mg Sublingual BID McNew, Ileene Hutchinson, MD      . diphenhydrAMINE (BENADRYL) capsule 50 mg  50 mg Oral QHS PRN McNew, Ileene Hutchinson, MD      . haloperidol (HALDOL) tablet 5 mg  5 mg Oral Q6H PRN McNew, Ileene Hutchinson, MD       Or  . haloperidol lactate (HALDOL) injection 5 mg  5 mg Intramuscular Q6H PRN McNew, Ileene Hutchinson, MD      . hydrOXYzine (ATARAX/VISTARIL) tablet 50 mg  50 mg Oral TID PRN McNew, Ileene Hutchinson, MD      . loperamide (IMODIUM) capsule 2-4 mg  2-4 mg Oral PRN McNew, Ileene Hutchinson, MD      . LORazepam (ATIVAN) tablet 1 mg  1 mg Oral Q6H PRN McNew, Holly R, MD      . magnesium hydroxide (MILK OF MAGNESIA) suspension 30 mL  30 mL Oral Daily PRN Charm Rings, NP      . ondansetron (ZOFRAN-ODT) disintegrating tablet 4 mg  4 mg Oral Q6H PRN McNew, Ileene Hutchinson, MD       PTA Medications: Medications Prior to Admission  Medication Sig Dispense Refill Last Dose  . benztropine (COGENTIN) 0.5 MG tablet Take 0.5 mg by mouth 2 (two) times daily.  0 unk at Unknown time  . Coenzyme Q10 (COQ-10 PO) Take 1 tablet by mouth daily.   unk at Unknown time  . haloperidol (HALDOL) 0.5 MG tablet Take 0.5 mg by mouth 2 (two) times daily.  0 unk at  Unknown time  . hydrOXYzine (ATARAX/VISTARIL) 25 MG tablet Take 3 tabs (75 mg) in the morning and 3 tabs (75 mg) at bedtime. (Patient not taking: Reported on 04/25/2018) 180 tablet 0 Not Taking at Unknown time    Patient Stressors: Marital or family conflict Medication change or noncompliance Substance abuse  Patient Strengths: Active sense of humor Average or above average intelligence Capable of independent living Communication skills General fund of knowledge Supportive family/friends  Treatment Modalities: Medication Management, Group therapy, Case management,  1 to 1 session with clinician, Psychoeducation, Recreational therapy.   Physician Treatment Plan for Primary Diagnosis: Schizoaffective disorder, bipolar type (HCC) Long Term Goal(s): Improvement in symptoms so as ready for discharge   Short Term Goals: Ability to demonstrate self-control will improve  Medication Management: Evaluate patient's response, side effects, and tolerance of medication regimen.  Therapeutic Interventions: 1 to 1 sessions, Unit Group sessions and Medication administration.  Evaluation of Outcomes: Progressing  Physician Treatment Plan for Secondary Diagnosis: Principal Problem:   Schizoaffective disorder, bipolar type (HCC) Active Problems:   Alcohol use disorder, moderate, dependence (HCC)   Tobacco use disorder  Long Term Goal(s): Improvement in symptoms so as ready for discharge  Short Term Goals: Ability to demonstrate self-control will improve     Medication Management: Evaluate patient's response, side effects, and tolerance of medication regimen.  Therapeutic Interventions: 1 to 1 sessions, Unit Group sessions and Medication administration.  Evaluation of Outcomes: Progressing   RN Treatment Plan for Primary Diagnosis: Schizoaffective disorder, bipolar type (HCC) Long Term Goal(s): Knowledge of disease and therapeutic regimen to maintain health will improve  Short Term  Goals: Ability to demonstrate self-control, Ability to identify and develop effective coping behaviors will improve and Compliance with prescribed medications will improve  Medication Management: RN will administer medications as ordered by provider, will assess and evaluate patient's response and provide education to patient for prescribed medication. RN will report any adverse and/or side effects to prescribing provider.  Therapeutic Interventions: 1 on 1 counseling sessions, Psychoeducation, Medication administration, Evaluate responses to treatment, Monitor vital signs and CBGs as ordered, Perform/monitor CIWA, COWS, AIMS and Fall Risk screenings as ordered, Perform wound care treatments as ordered.  Evaluation of Outcomes: Progressing   LCSW Treatment Plan for Primary Diagnosis: Schizoaffective disorder, bipolar type (HCC) Long Term Goal(s): Safe transition to appropriate next level of care at discharge, Engage patient in therapeutic group addressing interpersonal concerns.  Short Term Goals: Engage patient in aftercare planning with referrals and resources and Increase skills for wellness and recovery  Therapeutic Interventions: Assess for all discharge needs, 1 to 1 time with Social worker, Explore available resources and support systems, Assess for adequacy in community support network, Educate family and significant other(s) on suicide prevention, Complete Psychosocial Assessment, Interpersonal group therapy.  Evaluation of Outcomes: Progressing   Progress in Treatment: Attending groups: Yes. Participating in groups: Yes. Taking medication as prescribed: Yes. Toleration medication: Yes. Family/Significant other contact made: No, will contact:  CSW will contact identified support person Patient understands diagnosis: Yes. Discussing patient identified problems/goals with staff: Yes. Medical problems stabilized or resolved: Yes. Denies suicidal/homicidal ideation:  Yes. Issues/concerns per patient self-inventory: No. Other: n/a  New problem(s) identified: No, Describe:  No new problems identified  New Short Term/Long Term Goal(s):  Patient Goals:  "I want to get out of here and not come back"  Discharge Plan or Barriers: Tentative plan is for pt to return home with her mother with outpatient psychiatric services established.  Reason for Continuation of Hospitalization: Hallucinations Mania Medication stabilization  Estimated Length of Stay: 3-5 days  Recreational Therapy: Patient Stressors: N/A Patient Goal: Patient will focus on task/topic with 2 prompts from staff within 5 recreation therapy group sessions  Attendees: Patient: Stacy Poole 05/08/2018 2:26 PM  Physician: Corinna Gab, MD 05/08/2018 2:26 PM  Nursing: Milas Hock, RN 05/08/2018 2:26 PM  RN Care Manager: 05/08/2018 2:26 PM  Social Worker: Huey Romans, LCSW 05/08/2018 2:26 PM  Recreational Therapist: Garret Reddish, LRT 05/08/2018 2:26 PM  Other: Johny Shears, LCSWA 05/08/2018 2:26 PM  Other: Damian Leavell, Chaplain 05/08/2018 2:26 PM  Other: 05/08/2018 2:26 PM    Scribe for Treatment Team: Alease Frame, LCSW 05/08/2018 2:26 PM

## 2018-05-08 NOTE — Progress Notes (Signed)
D: Pt denies SI/HI/AVH and verbally is able to contract for safety. Pt. Can be very polite and pleasant/cooperative during assessment and/or interactions, but overall mood is very labile and periods of tangential speech frequent. Pt. Observed during assessment with avertive eye contact that is brief and appears anxious, but pleasant. Pt. Talks to me during assessments about the symbols and words she has colored with a marker on her arms and explains these are, "satanic symbols for the god hades, do you know about greek mythology?". Pt. Expresses some anxiety and restlessness so PRN medications given, with some good effect. Pt. Unwilling to participate this evening and outside of assessments looks around the unit from her room suspicious of staff and peers and looks out her window often, with periods of coming out of her room walking down the hallway noticeably irritable, but unable to identify why. Pt. Appears to continue to respond to internal stimuli and have strong delusions.  A: Q x 15 minute observation checks were completed for safety. Patient was provided with education, but shows no evidence of learning.  Patient was given/offered medications per orders. Patient  was encourage to attend groups, participate in unit activities and continue with plan of care. Pt. Chart and plans of care reviewed. Pt. Given support and encouragement.   R: Patient is complaint with medication, but unit participation is poor.             Precautionary checks every 15 minutes for safety maintained, room free of safety hazards, patient sustains no injury or falls during this shift. Will endorse care to next shift.
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
## 2018-05-08 NOTE — Progress Notes (Signed)
D- Patient alert and oriented. Patient is in a pleasant mood on assessment stating that she slept "good" last night, but has complaints of a headache, rating her pain a "10/10". Patient did request pain medication from this writer. Patient denies SI, HI, AVH, at this time. Patient also denies any depression and anxiety stating "no, I'm not a depressed or crazy person". Patient's goal for today is to "figure out how long they're going to hold me here".  A- Scheduled medications administered to patient, per MD orders. Support and encouragement provided.  Routine safety checks conducted every 15 minutes.  Patient informed to notify staff with problems or concerns.   R- No adverse drug reactions noted. Patient contracts for safety at this time. Patient compliant with medications and treatment plan. Patient receptive, calm, and cooperative. Patient interacts well with others on the unit.  Patient remains safe at this time.

## 2018-05-08 NOTE — Progress Notes (Signed)
Recreation Therapy Notes  INPATIENT RECREATION THERAPY ASSESSMENT  Patient Details Name: Stacy Poole MRN: [id number] DOB: 02/28/1991 Today's Date: 05/08/2018       Information Obtained From: Patient  Able to Participate in Assessment/Interview: Yes  Patient Presentation: Responsive  Reason for Admission (Per Patient): Active Symptoms, Med Non-Compliance  Patient Stressors:    Coping Skills:   Art  Leisure Interests (2+):  Art - Draw, Art - Coloring, Art - Paint, Music - Listen, Nature - Hiking(Driving)  Frequency of Recreation/Participation: Monthly  Awareness of Community Resources:  No  Community Resources:     Current Use: No  If no, Barriers?:    Expressed Interest in [employer] Information: Yes  County of Residence:  [place]  Patient Main Form of Transportation: Set designer  Patient Strengths:  Art  Patient Identified Areas of Improvement:  Not putting myself first  Patient Goal for Hospitalization:  To get out  Current SI (including self-harm):  No  Current HI:  No  Current AVH: No  Staff Intervention Plan: Group Attendance, Collaborate with Interdisciplinary Treatment Team  Consent to Intern Participation: N/A  Apollos Tenbrink 05/08/2018, 3:21 PM

## 2018-05-08 NOTE — Plan of Care (Addendum)
Patient found in room upon my arrival. Patient is visible and slightly social this evening. Attends group. Behavior is markedly improved over yesterday. Although still responding, patient is more calm and in control of her activities. Patient remains hesitant to trust staff and anxious but less so than yesterday. Bizarre behavior is remarkably less frequent. Patient is calm, cooperative, and polite throughout the evening. No outbursts observed. Remains delusional regarding grandiosity and paranoia but less verbal about it. Less intrusive and speech is less pressured and loud. Denies SI/HI/AVH. Denies pain. Reports eating and voiding adequately. Appearance slightly less bizarre. Patient has no scheduled HS medications. Given Trazodone for sleep with positive results. Q 15 minute checks maintained. Will continue to monitor throughout the shift. Patient slept 6 hours. No apparent distress. Will endorse care to oncoming shift.  Problem: Education: Goal: Knowledge of General Education information will improve Description Including pain rating scale, medication(s)/side effects and non-pharmacologic comfort measures Outcome: Progressing   Problem: Education: Goal: Emotional status will improve Outcome: Progressing Goal: Mental status will improve Outcome: Progressing Goal: Verbalization of understanding the information provided will improve Outcome: Progressing   Problem: Activity: Goal: Interest or engagement in activities will improve Outcome: Progressing Goal: Sleeping patterns will improve Outcome: Progressing   Problem: Coping: Goal: Ability to verbalize frustrations and anger appropriately will improve Outcome: Progressing Goal: Ability to demonstrate self-control will improve Outcome: Progressing

## 2018-05-08 NOTE — Plan of Care (Addendum)
Pt. Is complaint with medications, but does not participate in groups or unit activities, just meals. Pt. Denies si/hi and verbally is able to contract for safety. Pt. Needs reinforcement on provided education. Pt. Continues to present suspicious of staff and peers, responding to internal stimuli, with strong observed preoccupation of religiosity. Pt. Present frequently anxious, irritable, and restless, but mood is very labile, and can be very pleasant and in control at good portions of time.    Problem: Education: Goal: Knowledge of General Education information will improve Description Including pain rating scale, medication(s)/side effects and non-pharmacologic comfort measures Outcome: Not Progressing   Problem: Education: Goal: Emotional status will improve Outcome: Not Progressing Goal: Mental status will improve Outcome: Not Progressing   Problem: Activity: Goal: Interest or engagement in activities will improve Outcome: Not Progressing   Problem: Education: Goal: Will be free of psychotic symptoms Outcome: Not Progressing   Problem: Health Behavior/Discharge Planning: Goal: Compliance with treatment plan for underlying cause of condition will improve Outcome: Progressing   Problem: Safety: Goal: Periods of time without injury will increase Outcome: Progressing   Problem: Safety: Goal: Ability to remain free from injury will improve Outcome: Progressing

## 2018-05-08 NOTE — Progress Notes (Signed)
   05/08/18 1030  Clinical Encounter Type  Visited With Patient  Visit Type Initial;Spiritual support;Behavioral Health  Referral From Social work  Consult/Referral To Chaplain  Spiritual Encounters  Spiritual Needs Emotional;Other (Comment)   CH attended the patient's treatment team meeting. Ms. Vickrey states that her goal is to get out of [facility]. Also that she is not crazy and will not be staying for 2 weeks like another patient she has talked to. Patient was interested in what the treatment team thought about her. Dr. Johnella Moloney redirected the patient who then stated that she can become very angry at times because of a condition that she has with the back of her brain. Dr. Johnella Moloney told her that she would talk with her more later on today.

## 2018-05-08 NOTE — [facility] Group Notes (Signed)
LCSW Group Therapy Note  05/08/2018 1:00 pm  Type of Therapy/Topic:  Group Therapy:  Emotion Regulation  Participation Level:  None   Description of Group:    The purpose of this group is to assist patients in learning to regulate negative emotions and experience positive emotions. Patients will be guided to discuss ways in which they have been vulnerable to their negative emotions. These vulnerabilities will be juxtaposed with experiences of positive emotions or situations, and patients will be challenged to use positive emotions to combat negative ones. Special emphasis will be placed on coping with negative emotions in conflict situations, and patients will process healthy conflict resolution skills.  Therapeutic Goals: 1. Patient will identify two positive emotions or experiences to reflect on in order to balance out negative emotions 2. Patient will label two or more emotions that they find the most difficult to experience 3. Patient will demonstrate positive conflict resolution skills through discussion and/or role plays  Summary of Patient Progress:  Stacy Poole has a difficult time actively participating in today's group discussion on emotion regulation.  It appears that she had a lot of internal stimuli affecting her psychiatrically. She was found "talking to herself" by CSW and tried to engage in side conversation with another pt.  CSW is unsure if she was hallucinating.       Therapeutic Modalities:   Cognitive Behavioral Therapy Feelings Identification Dialectical Behavioral Therapy

## 2018-05-08 NOTE — Progress Notes (Signed)
Recreation Therapy Notes   Date: 05/08/2018  Time: 9:30 am  Location: Craft Room  Behavioral response: Appropriate,Hyperverbal  Intervention Topic: Team Work  Discussion/Intervention:  Group content on today was focused on teamwork. The group identified what teamwork is. Individuals described who is a part of their team. Patients expressed why they thought teamwork is important. The group stated reasons why they thought it was easier to work with a Comptroller team. Individuals discussed some positives and negatives of working with a team. Patients gave examples of past experiences they had while working with a team. The group participated in the intervention "What is That", where patients were given a chance to point out qualities they look for in a team mate and were able to work in teams with each other.  Clinical Observations/Feedback:  Patient came to group and stated that a trait she looks for in a team member is loyalty. She identified being a leader, strong and making tough calls are all things she can offer a team. Individual was social with peers and staff while participating in the intervention.  Ciria Bernardini LRT/CTRS         Erikah Thumm 05/08/2018 11:40 AM
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
## 2018-05-09 NOTE — Progress Notes (Signed)
Recreation Therapy Notes  Date: 05/09/2018  Time: 9:30 am  Location: Craft Room  Behavioral response: Appropriate    Intervention Topic: Coping  Discussion/Intervention:  Group content on today was focused on coping skills. The group defined what coping skills are and when they can be used. Individuals described how they normally cope with things and the coping skills they normally use. Patients expressed why it is important to cope with things and how not coping with things can affect you. The group participated in the intervention "My coping box" and made coping boxes while adding coping skills they could use in the future to the box.  Clinical Observations/Feedback:  Patient came to group and defined coping skills as read and react. She identified art as a positive coping skill she can use in the future. Individual was social with peers and staff during group while participating in the intervention. Stacy Poole         [name] 05/09/2018 10:57 AM
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
## 2018-05-09 NOTE — [facility] Group Notes (Signed)
LCSW Group Therapy Note  05/09/2018 1:pm  Type of Therapy/Topic:  Group Therapy:  Balance in Life  Participation Level:  Active  Description of Group:    This group will address the concept of balance and how it feels and looks when one is unbalanced. Patients will be encouraged to process areas in their lives that are out of balance and identify reasons for remaining unbalanced. Facilitators will guide patients in utilizing problem-solving interventions to address and correct the stressor making their life unbalanced. Understanding and applying boundaries will be explored and addressed for obtaining and maintaining a balanced life. Patients will be encouraged to explore ways to assertively make their unbalanced needs known to significant others in their lives, using other group members and facilitator for support and feedback.  Therapeutic Goals: 1. Patient will identify two or more emotions or situations they have that consume much of in their lives. 2. Patient will identify signs/triggers that life has become out of balance:  3. Patient will identify two ways to set boundaries in order to achieve balance in their lives:  4. Patient will demonstrate ability to communicate their needs through discussion and/or role plays  Summary of Patient Progress:  Stacy Poole described balance as a challenge. She stated that she finds herself pushing people away and then feeling lonely. She would like to find balance between those extremes.      Therapeutic Modalities:   Cognitive Behavioral Therapy Solution-Focused Therapy Assertiveness Training  Alease Frame, LCSW 05/09/2018 2:00 PM

## 2018-05-10 NOTE — Progress Notes (Addendum)
[facility] LCSW Group Therapy Note  Date/Time: 05/10/18, 1300  Type of Therapy and Topic:  Group Therapy:  Feelings around Relapse and Recovery  Participation Level:  None   Mood:  Description of Group:    Patients in this group will discuss emotions they experience before and after a relapse. They will process how experiencing these feelings, or avoidance of experiencing them, relates to having a relapse. Facilitator will guide patients to explore emotions they have related to recovery. Patients will be encouraged to process which emotions are more powerful. They will be guided to discuss the emotional reaction significant others in their lives may have to patients' relapse or recovery. Patients will be assisted in exploring ways to respond to the emotions of others without this contributing to a relapse.  Therapeutic Goals: 1. Patient will identify two or more emotions that lead to relapse for them:  2. Patient will identify two emotions that result when they relapse:  3. Patient will identify two emotions related to recovery:  4. Patient will demonstrate ability to communicate their needs through discussion and/or role plays.   Summary of Patient Progress:Pt came to group briefly but then left again only a few minutes later.     Therapeutic Modalities:   Cognitive Behavioral Therapy Solution-Focused Therapy Assertiveness Training Relapse Prevention Therapy  Daleen Squibb, LCSW
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
## 2018-05-10 NOTE — Plan of Care (Signed)
Patient was talking to herself and dancing in the hallway this morning.Patient came to staff for medications for "my PTSD flashbacks."PRN meds given.Patient had a good nap in the after noon.Patient states "I had PTSD flask back when I was in the group.When I have flash backs I talk to myself.I never heard any voices.Haldol helped me to relax today."Patient denies SI,HI and AVH.Support and encouragement given.

## 2018-05-10 NOTE — Progress Notes (Signed)
D: Pt denies SI/HI/AVH and minimizes condition. Pt. Interaction is minimal and bizarre. Pt. Peered out of her room periodically throughout the night and stood starring at the floor at her doorway facing out to the hallway for quite sometime, before returning to bed. Pt. At one time walked down the main hallway then back to her room. On the way back to her room the patient was greeted by this Clinical research associate, when the patient stated, "I know what you're all trying to do to me..i'm really glad I'm able to sleep it's good, because I was addicted to ativan for so long, but now I'm good". Pt. Eye contact is avertive and brief. Pt. Was redirected in this moment. Pt. Behavior continues to be unpredictable. Pt. Seems suspicious of staff and still paranoid.   A: Q x 15 minute observation checks were completed for safety. Patient was provided with education, but needs reinforcement.  Patient was given/offered medications per orders. Patient  was encourage to attend groups, participate in unit activities and continue with plan of care. Pt. Chart and plans of care reviewed. Pt. Given support and encouragement.   R: Patient is complaint with medication and unit procedures with direction from staff. Pt. Isolative often.            Precautionary checks every 15 minutes for safety maintained, room free of safety hazards, patient sustains no injury or falls during this shift. Will endorse care to next shift.

## 2018-05-10 NOTE — Progress Notes (Signed)
Recreation Therapy Notes  Date: 05/10/2018  Time: 9:30 am  Location: Craft Room  Behavioral response: Hyperverbal, Hyperactive, Redirected  Intervention Topic: Happiness  Discussion/Intervention:  Group content today was focused on Happiness. The group defined happiness and stated reasons they are and are not happy at times. Participants identified reasons they are normally happy and why. Individuals expressed how not being happy affects themselves and others. Patients stated reasons why happiness is important to them. The group described how they feel when they are happy. Individuals participated in the intervention "What is happiness" where they defined what happiness means to them.   Clinical Observations/Feedback:  Patient came to group and was singing and talking all over her peers. She was redirected several times by staff. Individual was asked to leave group; she left and returned several times.  Freddy Kinne LRT/CTRS          Reneta Niehaus 05/10/2018 12:48 PM
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
## 2018-05-10 NOTE — Plan of Care (Signed)
Pt. Denies Si/hi and verbally can contract for safety. Pt. Reports she can remain safe while on the unit. Pt. Continues to present with bizarre behavior interacting with staff and peers around the unit. Pt. Speech is thought blocking, delusional, paranoid, and potentially still responding to internal stimuli. Pt. Is bizzare with frequent periods of disorganized speech.    Problem: Education: Goal: Will be free of psychotic symptoms Outcome: Not Progressing   Problem: Safety: Goal: Ability to remain free from injury will improve Outcome: Progressing   Problem: Safety: Goal: Ability to remain free from injury will improve Outcome: Progressing

## 2018-05-11 NOTE — Plan of Care (Signed)
Pt. Mood is pleasant and cooperative, but continues to present with paranoid, disorganized speech/thought content, thought blocking, and preoccupations. Pt. Is less labile overall. Pt. Is complaint with medications and treatments, but does not attend groups and is still a bit isolative and withdrawn to room. Pt. Needs reinforcement on provided education.    Problem: Education: Goal: Knowledge of [facility] Education information/materials will improve Outcome: Not Progressing   Problem: Activity: Goal: Interest or engagement in activities will improve Outcome: Not Progressing   Problem: Education: Goal: Will be free of psychotic symptoms Outcome: Not Progressing   Problem: Education: Goal: Emotional status will improve Outcome: Progressing Goal: Mental status will improve Outcome: Progressing   Problem: Health Behavior/Discharge Planning: Goal: Compliance with treatment plan for underlying cause of condition will improve Outcome: Progressing   Problem: Safety: Goal: Periods of time without injury will increase Outcome: Progressing

## 2018-05-11 NOTE — Progress Notes (Signed)
D: Pt denies SI/HI/AVH, and verbally is able to contract for safety. Pt. Mood is pleasant and cooperative, but continues to present with paranoid, disorganized speech/thought content, thought blocking, and preoccupations. Pt. Is less labile. Pt. Is complaint with medications and treatments, but does not attend groups and is still a bit isolative and withdrawn to room. Pt. Is fixated on going home today and this has been causing her anxiety. Pt. Given PRN medications for comfort with good effect. Pt. Insight still poor. Pt. Makes statements such as, "I'm not psychotic I won't need any of these medications, I just have PTSD", "I look like I'm talking to myself sometimes, because the flashbacks like make me talk to myself". Facial expression is still flat and animated like. Pt. Attends to hygiene.   A: Q x 15 minute observation checks were completed for safety. Patient was provided with education, needs reinforcement.  Patient was given/offered medications per orders. Patient  was encourage to attend groups, participate in unit activities and continue with plan of care. Pt. Chart and plans of care reviewed. Pt. Given support and encouragement.   R: Patient is complaint with medication and unit procedures, continue individualized treatment plan. Monitor for safety by staff. Pt. Does attend meals.             Precautionary checks every 15 minutes for safety maintained, room free of safety hazards, patient sustains no injury or falls during this shift. Will endorse care to next shift.

## 2018-05-11 NOTE — [facility] Group Notes (Signed)
[facility] Group Notes:  (Nursing/MHT/Case Management/Adjunct)  Date:  05/11/2018  Time:  12:39 AM  Type of Therapy:  Group Therapy  Participation Level:  Active  Participation Quality:  Appropriate  Affect:  Appropriate  Cognitive:  Appropriate  Insight:  Improving  Engagement in Group:  Engaged  Modes of Intervention:  Discussion  Summary of Progress/Problems: Jadalynn attended group. Emmajane completed her self-inventory sheet. Linelle was cooperative on this day. Bettey stated her goal was to work on her discharge plan. Srihita reported she was doing well. MHT reviewed the rules and expectations of the unit. MHT addressed concerns with patients sharing clothes. MHT processed with patients why it was a rule and why it had to be followed. MHT processed with patients about the snack schedule. MHT addressed concerns patients expressed during wrap up. MHT provided patients with self-inventory sheet to complete.  Jinger Neighbors 05/11/2018, 12:39 AM
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
## 2018-05-11 NOTE — [facility] Group Notes (Signed)
LCSW Group Therapy Note   05/11/2018 1:15pm   Type of Therapy and Topic:  Group Therapy:  Trust and Honesty  Participation Level:  Did Not Attend  Description of Group:    In this group patients will be asked to explore the value of being honest.  Patients will be guided to discuss their thoughts, feelings, and behaviors related to honesty and trusting in others. Patients will process together how trust and honesty relate to forming relationships with peers, family members, and self. Each patient will be challenged to identify and express feelings of being vulnerable. Patients will discuss reasons why people are dishonest and identify alternative outcomes if one was truthful (to self or others). This group will be process-oriented, with patients participating in exploration of their own experiences, giving and receiving support, and processing challenge from other group members.   Therapeutic Goals: 1. Patient will identify why honesty is important to relationships and how honesty overall affects relationships.  2. Patient will identify a situation where they lied or were lied too and the  feelings, thought process, and behaviors surrounding the situation 3. Patient will identify the meaning of being vulnerable, how that feels, and how that correlates to being honest with self and others. 4. Patient will identify situations where they could have told the truth, but instead lied and explain reasons of dishonesty.   Summary of Patient Progress: Pt was invited to attend group but chose not to attend. CSW will continue to encourage pt to attend group throughout their admission.     Therapeutic Modalities:   Cognitive Behavioral Therapy Solution Focused Therapy Motivational Interviewing Brief Therapy  Yajayra Feldt  CUEBAS-COLON, LCSW 05/11/2018 12:52 PM  

## 2018-05-11 NOTE — Plan of Care (Signed)
Patient is progressing in all areas, patient states that her medications are working well for her, she is sleeping long hours with out any interruptions, attends groups with good participation and assertive , patient is anticipating to be discharged soon and said that I  want  to do all the right things, so I can be discharge from here, patient is compliant with her medicines, and maintaining safety in the unit, no complain voiced, 15 minute rounding is in progress, no distress   Problem: Education: Goal: Knowledge of General Education information will improve Description Including pain rating scale, medication(s)/side effects and non-pharmacologic comfort measures Outcome: Progressing   Problem: Education: Goal: Knowledge of [place] General Education information/materials will improve Outcome: Progressing Goal: Emotional status will improve Outcome: Progressing Goal: Mental status will improve Outcome: Progressing Goal: Verbalization of understanding the information provided will improve Outcome: Progressing   Problem: Activity: Goal: Interest or engagement in activities will improve Outcome: Progressing Goal: Sleeping patterns will improve Outcome: Progressing   Problem: Coping: Goal: Ability to verbalize frustrations and anger appropriately will improve Outcome: Progressing Goal: Ability to demonstrate self-control will improve Outcome: Progressing   Problem: Health Behavior/Discharge Planning: Goal: Identification of resources available to assist in meeting health care needs will improve Outcome: Progressing Goal: Compliance with treatment plan for underlying cause of condition will improve Outcome: Progressing   Problem: Physical Regulation: Goal: Ability to maintain clinical measurements within normal limits will improve Outcome: Progressing   Problem: Safety: Goal: Periods of time without injury will increase Outcome: Progressing   Problem: Education: Goal:  Will be free of psychotic symptoms Outcome: Progressing Goal: Knowledge of the prescribed therapeutic regimen will improve Outcome: Progressing   Problem: Health Behavior/Discharge Planning: Goal: Compliance with prescribed medication regimen will improve Outcome: Progressing   Problem: Safety: Goal: Ability to redirect hostility and anger into socially appropriate behaviors will improve Outcome: Progressing Goal: Ability to remain free from injury will improve Outcome: Progressing   Problem: Education: Goal: Knowledge of disease or condition will improve Outcome: Progressing Goal: Understanding of discharge needs will improve Outcome: Progressing   Problem: Physical Regulation: Goal: Complications related to the disease process, condition or treatment will be avoided or minimized Outcome: Progressing   Problem: Safety: Goal: Ability to remain free from injury will improve Outcome: Progressing

## 2018-05-12 NOTE — Plan of Care (Signed)
Pt. Pleasant and cooperative this evening, Complaint with medications and unit procedures. Pt. Participation is good. Pt. Improved clarity and train of thought. Pt. Able to wait patiently for requests to be met and appropriate times for medications. Pt. Denies SI/Hi and verbally is able to contract for safety. Pt. Does continue to present with disorganized thought process periodically, but conversations more normal this evening. Pt. Preoccupied with discharge and doing homework reportedly.    Problem: Education: Goal: Will be free of psychotic symptoms Outcome: Not Progressing   Problem: Education: Goal: Emotional status will improve Outcome: Progressing Goal: Mental status will improve Outcome: Progressing   Problem: Activity: Goal: Interest or engagement in activities will improve Outcome: Progressing   Problem: Coping: Goal: Ability to demonstrate self-control will improve Outcome: Progressing   Problem: Health Behavior/Discharge Planning: Goal: Compliance with treatment plan for underlying cause of condition will improve Outcome: Progressing   Problem: Safety: Goal: Periods of time without injury will increase Outcome: Progressing   Problem: Safety: Goal: Ability to remain free from injury will improve Outcome: Progressing

## 2018-05-12 NOTE — Plan of Care (Signed)
Data: Patient is appropriate and cooperative to assessment. Patient denies SI/HI/AVH. Patient has completed daily self inventory worksheet. Patient has complaints of anxiety and a pain rating of 0/10. Patient reports good sleep quality, appetite is good. Patient rates depression "0/10" , feelings of hopelessness "0/10" and anxiety "0/10" Patients goal for today is "work on discharge."  Action:  Q x 15 minute observation checks were completed for safety. Patient was provided with education on medications. Patient was offered support and encouragement. Patient was given scheduled medications. Patient  was encourage to attend groups, participate in unit activities and continue with plan of care.     Response: Patient is complaint with medications. Patient has no complaints at this time. Patient is receptive to treatment and safety maintained on unit.    Problem: Education: Goal: Knowledge of [place] General Education information/materials will improve Outcome: Progressing Goal: Verbalization of understanding the information provided will improve Outcome: Progressing   Problem: Coping: Goal: Ability to demonstrate self-control will improve Outcome: Progressing   Problem: Safety: Goal: Periods of time without injury will increase Outcome: Progressing   Problem: Education: Goal: Knowledge of the prescribed therapeutic regimen will improve Outcome: Progressing

## 2018-05-12 NOTE — Plan of Care (Signed)
Stacy Poole spent most of the evening in the milieu with peers. She still reports some anxiety and was given PRN medication with effect.  She was cooperative with treatment on shift. She appears to be in bed resting quietly at this time.

## 2018-05-12 NOTE — [facility] Group Notes (Signed)
LCSW Group Therapy Note 05/12/2018 1:15pm  Type of Therapy and Topic: Group Therapy: Feelings Around Returning Home & Establishing a Supportive Framework and Supporting Oneself When Supports Not Available  Participation Level: Active  Description of Group:  Patients first processed thoughts and feelings about upcoming discharge. These included fears of upcoming changes, lack of change, new living environments, judgements and expectations from others and overall stigma of mental health issues. The group then discussed the definition of a supportive framework, what that looks and feels like, and how do to discern it from an unhealthy non-supportive network. The group identified different types of supports as well as what to do when your family/friends are less than helpful or unavailable  Therapeutic Goals  1. Patient will identify one healthy supportive network that they can use at discharge. 2. Patient will identify one factor of a supportive framework and how to tell it from an unhealthy network. 3. Patient able to identify one coping skill to use when they do not have positive supports from others. 4. Patient will demonstrate ability to communicate their needs through discussion and/or role plays.  Summary of Patient Progress:  The patient scored her mood at a 8 or 9 (10 best.) Pt engaged during group session. As patients processed their anxiety about discharge and described healthy supports patient shared that she is ready to be discharge. Pt states "I have a plan to see a psychiatrist and a therapist" Pt listed his family as part of her support system.  Patients identified at least one self-care tool they were willing to use after discharge; paint, practice yoga, hike, run, and practice martial arts.   Therapeutic Modalities Cognitive Behavioral Therapy Motivational Interviewing   Stacy Poole  CUEBAS-COLON, LCSW 05/12/2018 10:56 AM
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
## 2018-05-12 NOTE — Progress Notes (Signed)
D: Pt denies SI/HI/AVH, contracts for safety. Pt is pleasant and cooperative, engages well with peers and staff for the most part. Pt. Reports heightened anxiety this evening, so PRN medications given. Pt. Reports anxious about discharging and completing homework reportedly. Pt. Fixated on taking ativan and sleep aids this evening. Pt. Does continue to present with disorganized thought process periodically, but conversations more normal this evening overall. Pt. Able to wait patiently for requests to be met and appropriate times for medications, improved. Pt. Improved clarity and train of thought overall. Pt. Participation is improved. Pt. Socializing more, less paranoid. No observation of responding to internal stimuli. Pt. Less grandiose.    A: Q x 15 minute observation checks were completed for safety. Patient was provided with education, but needs reinforcement.  Patient was given/offered medications per orders. Patient  was encourage to attend groups, participate in unit activities and continue with plan of care. Pt. Chart and plans of care reviewed. Pt. Given support and encouragement.   R: Patient is complaint with medication and unit procedures.             Precautionary checks every 15 minutes for safety maintained, room free of safety hazards, patient sustains no injury or falls during this shift. Will endorse care to next shift.

## 2018-05-13 MED ORDER — ASENAPINE MALEATE 10 MG SL SUBL: 10.0000 mg | SUBLINGUAL_TABLET | Freq: Two times a day (BID) | SUBLINGUAL | 0 refills | Status: DC
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
## 2018-05-13 NOTE — Progress Notes (Signed)
Recreation Therapy Notes  Date: 05/13/2018  Time: 9:30 am  Location: Craft Room  Behavioral response: Appropriate    Intervention Topic: Communication  Discussion/Intervention:  Group content today was focused on communication. The group defined communication and ways to communicate with others. Individuals stated reason why communication is important and some reasons to communicate with others. Patients expressed if they thought they were good at communicating with others and ways they could improve their communication skills. The group identified important parts of communication and some experiences they have had in the past with communication. The group participated in the intervention "Put the story together", where they had a chance to test out their communication skills and identify ways to improve their communication techniques.   Clinical Observations/Feedback:  Patient came to group and stated she had to leave group because she was working on getting discharged. Individual left group and never returned.  Stacy Poole LRT/CTRS         Stacy Poole 05/13/2018 1:06 PM

## 2018-05-13 NOTE — Progress Notes (Signed)
D: Pt denies SI/HI/AVH, verbally contracts for safety. Pt is pleasant and cooperative, engages well. Pt. has no Complaints, just reports anticipation for discharge tomorrow.  Patient interaction appropriate with staff and peers. Pt. This evening much more organized. Pt. Does report anxiety, but relieved with PRN medications. Pt. Still animated in expression and occasionally disorganized, but overall clarity is improved. Pt. Eager to go home and start school work again.   A: Q x 15 minute observation checks were completed for safety. Patient was provided with education, needs reinforcement at times.  Patient was given/offered medications per orders. Patient  was encourage to attend groups, participate in unit activities and continue with plan of care. Pt. Chart and plans of care reviewed. Pt. Given support and encouragement.   R: Patient is complaint with medication and unit procedures. Pt. Participation good.              Precautionary checks every 15 minutes for safety maintained, room free of safety hazards, patient sustains no injury or falls during this shift. Will endorse care to next shift.   Patient observation @0120am   Pt. Continues to fixate on taking PRN medications for anxiety. Pt. Gets a little irritable and persistent that PRN ativan is necessary for her to cope throughout the night. Pt. Still animated and flat in facial expression and mildly disorganized at times, but much clearer overall.
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
## 2018-05-13 NOTE — [facility] Suicide Risk Assessment (Signed)
[facility] INPATIENT:  Family/Significant Other Suicide Prevention Education  Suicide Prevention Education:  Education Completed; Stacy Poole [phone],  (name of family member/significant other) has been identified by the patient as the family member/significant other with whom the patient will be residing, and identified as the person(s) who will aid the patient in the event of a mental health crisis (suicidal ideations/suicide attempt).  With written consent from the patient, the family member/significant other has been provided the following suicide prevention education, prior to the and/or following the discharge of the patient.  The suicide prevention education provided includes the following:  Suicide risk factors  Suicide prevention and interventions  National Suicide Hotline telephone number  [facility] assessment telephone number  [facility] Emergency Assistance 911  [facility] and/or Residential Mobile Crisis Unit telephone number  Request made of family/significant other to:  Remove weapons (e.g., guns, rifles, knives), all items previously/currently identified as safety concern.    Remove drugs/medications (over-the-counter, prescriptions, illicit drugs), all items previously/currently identified as a safety concern.  The family member/significant other verbalizes understanding of the suicide prevention education information provided.  The family member/significant other agrees to remove the items of safety concern listed above.    Cleda Daub Stacy Quackenbush, LCSW 05/13/2018, 5:14 PM
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
## 2018-05-13 NOTE — Progress Notes (Signed)
  [facility] Adult Case Management Discharge Plan :  Will you be returning to the same living situation after discharge:  Yes,    At discharge, do you have transportation home?: Yes,    Do you have the ability to pay for your medications: Yes,     Release of information consent forms completed and in the chart;  Patient's signature needed at discharge.  Patient to Follow up at: Follow-up Information    [facility]. Go on 05/15/2018.   Specialty:  Behavioral Health Why:  3:00pm for hospital follow up with Dr. Marlyne Beards. Contact information: [address] [place] [address] [phone]        [facility]. Schedule an appointment as soon as possible for a visit.   Why:  Please follow up.  Referral was made at your request, but please call them to schedule new patient appointment. Contact information: [address] [place] [address] [phone]        [facility] Follow up.   Specialty:  Behavioral Health Why:  This provider may be able to meet your needs if the above referrals do not work out.  Contact information: [address] [place] [address] [phone]           Next level of care provider has access to [facility] Link:yes  Safety Planning and Suicide Prevention discussed: Yes,     Have you used any form of tobacco in the last 30 days? (Cigarettes, Smokeless Tobacco, Cigars, and/or Pipes): Yes  Has patient been referred to the Quitline?: Patient refused referral  Patient has been referred for addiction treatment: Yes  Glennon Mac, LCSW 05/13/2018, 5:14 PM

## 2018-05-13 NOTE — Plan of Care (Signed)
Patient stated that she is excited to go home tomorrow.Patient states "as soon as I get home I need to start my homework."Pleasant and cooperative on approach.Denies SI,HI and AVH.Appetite and energy level good.Support and encouragement given.

## 2018-05-13 NOTE — Tx Team (Signed)
Interdisciplinary Treatment and Diagnostic Plan Update  05/13/2018 Time of Session: 11:00 AM Stacy Poole MRN: [id number]  Principal Diagnosis: Schizoaffective disorder, bipolar type (HCC)  Secondary Diagnoses: Principal Problem:   Schizoaffective disorder, bipolar type (HCC) Active Problems:   Alcohol use disorder, moderate, dependence (HCC)   Tobacco use disorder   Current Medications:  Current Facility-Administered Medications  Medication Dose Route Frequency Provider Last Rate Last Dose  . acetaminophen (TYLENOL) tablet 650 mg  650 mg Oral Q6H PRN Charm Rings, NP   650 mg at 05/08/18 1851  . alum & mag hydroxide-simeth (MAALOX/MYLANTA) 200-200-20 MG/5ML suspension 30 mL  30 mL Oral Q4H PRN Charm Rings, NP   30 mL at 05/12/18 1859  . asenapine (SAPHRIS) sublingual tablet 10 mg  10 mg Sublingual BID McNew, Ileene Hutchinson, MD   10 mg at 05/13/18 0801  . diphenhydrAMINE (BENADRYL) capsule 50 mg  50 mg Oral QHS PRN McNew, Ileene Hutchinson, MD   50 mg at 05/12/18 2126  . haloperidol (HALDOL) tablet 5 mg  5 mg Oral Q6H PRN McNew, Ileene Hutchinson, MD   5 mg at 05/10/18 4098   Or  . haloperidol lactate (HALDOL) injection 5 mg  5 mg Intramuscular Q6H PRN McNew, Ileene Hutchinson, MD      . hydrOXYzine (ATARAX/VISTARIL) tablet 50 mg  50 mg Oral TID PRN Haskell Riling, MD   50 mg at 05/13/18 1034  . LORazepam (ATIVAN) tablet 1 mg  1 mg Oral Q6H PRN Haskell Riling, MD   1 mg at 05/12/18 2223  . magnesium hydroxide (MILK OF MAGNESIA) suspension 30 mL  30 mL Oral Daily PRN Charm Rings, NP       PTA Medications: Medications Prior to Admission  Medication Sig Dispense Refill Last Dose  . benztropine (COGENTIN) 0.5 MG tablet Take 0.5 mg by mouth 2 (two) times daily.  0 unk at Unknown time  . Coenzyme Q10 (COQ-10 PO) Take 1 tablet by mouth daily.   unk at Unknown time  . haloperidol (HALDOL) 0.5 MG tablet Take 0.5 mg by mouth 2 (two) times daily.  0 unk at Unknown time  . hydrOXYzine (ATARAX/VISTARIL) 25 MG tablet  Take 3 tabs (75 mg) in the morning and 3 tabs (75 mg) at bedtime. (Patient not taking: Reported on 04/25/2018) 180 tablet 0 Not Taking at Unknown time    Patient Stressors: Marital or family conflict Medication change or noncompliance Substance abuse  Patient Strengths: Active sense of humor Average or above average intelligence Capable of independent living Communication skills General fund of knowledge Supportive family/friends  Treatment Modalities: Medication Management, Group therapy, Case management,  1 to 1 session with clinician, Psychoeducation, Recreational therapy.   Physician Treatment Plan for Primary Diagnosis: Schizoaffective disorder, bipolar type (HCC) Long Term Goal(s): Improvement in symptoms so as ready for discharge   Short Term Goals: Ability to demonstrate self-control will improve  Medication Management: Evaluate patient's response, side effects, and tolerance of medication regimen.  Therapeutic Interventions: 1 to 1 sessions, Unit Group sessions and Medication administration.  Evaluation of Outcomes: Progressing  Physician Treatment Plan for Secondary Diagnosis: Principal Problem:   Schizoaffective disorder, bipolar type (HCC) Active Problems:   Alcohol use disorder, moderate, dependence (HCC)   Tobacco use disorder  Long Term Goal(s): Improvement in symptoms so as ready for discharge   Short Term Goals: Ability to demonstrate self-control will improve     Medication Management: Evaluate patient's response, side effects, and tolerance of medication  regimen.  Therapeutic Interventions: 1 to 1 sessions, Unit Group sessions and Medication administration.  Evaluation of Outcomes: Progressing   RN Treatment Plan for Primary Diagnosis: Schizoaffective disorder, bipolar type (HCC) Long Term Goal(s): Knowledge of disease and therapeutic regimen to maintain health will improve  Short Term Goals: Ability to demonstrate self-control, Ability to identify  and develop effective coping behaviors will improve and Compliance with prescribed medications will improve  Medication Management: RN will administer medications as ordered by provider, will assess and evaluate patient's response and provide education to patient for prescribed medication. RN will report any adverse and/or side effects to prescribing provider.  Therapeutic Interventions: 1 on 1 counseling sessions, Psychoeducation, Medication administration, Evaluate responses to treatment, Monitor vital signs and CBGs as ordered, Perform/monitor CIWA, COWS, AIMS and Fall Risk screenings as ordered, Perform wound care treatments as ordered.  Evaluation of Outcomes: Progressing   LCSW Treatment Plan for Primary Diagnosis: Schizoaffective disorder, bipolar type (HCC) Long Term Goal(s): Safe transition to appropriate next level of care at discharge, Engage patient in therapeutic group addressing interpersonal concerns.  Short Term Goals: Engage patient in aftercare planning with referrals and resources and Increase skills for wellness and recovery  Therapeutic Interventions: Assess for all discharge needs, 1 to 1 time with Social worker, Explore available resources and support systems, Assess for adequacy in community support network, Educate family and significant other(s) on suicide prevention, Complete Psychosocial Assessment, Interpersonal group therapy.  Evaluation of Outcomes: Progressing   Progress in Treatment: Attending groups: Yes. Participating in groups: Yes. Taking medication as prescribed: Yes. Toleration medication: Yes. Family/Significant other contact made: No, will contact:  CSW will contact identified support person Patient understands diagnosis: Yes. Discussing patient identified problems/goals with staff: Yes. Medical problems stabilized or resolved: Yes. Denies suicidal/homicidal ideation: Yes. Issues/concerns per patient self-inventory: No. Other: n/a  New  problem(s) identified: No, Describe:  No new problems identified  New Short Term/Long Term Goal(s):  Patient Goals:  "I want to get out of here and not come back"  Discharge Plan or Barriers: Tentative plan is for pt to return home with her mother with outpatient psychiatric services established.  Reason for Continuation of Hospitalization: Medication Stabilization and coordination of aftercare  Estimated Length of Stay: 1 day  Recreational Therapy: Patient Stressors: N/A Patient Goal: Patient will focus on task/topic with 2 prompts from staff within 5 recreation therapy group sessions  Attendees: Patient: Caridee Gouge 05/13/2018 5:17 PM  Physician: Corinna Gab, MD 05/13/2018 5:17 PM  Nursing: Milas Hock, RN 05/13/2018 5:17 PM  RN Care Manager: 05/13/2018 5:17 PM  Social Worker: Jake Shark LCSW 05/13/2018 5:17 PM  Recreational Therapist: Garret Reddish, LRT 05/13/2018 5:17 PM  Other: Daryel Gerald, LCSW 05/13/2018 5:17 PM  Other: Damian Leavell, Chaplain 05/13/2018 5:17 PM  Other: 05/13/2018 5:17 PM    Scribe for Treatment Team: Glennon Mac, LCSW 05/13/2018 5:17 PM

## 2018-05-13 NOTE — Plan of Care (Signed)
Pt. This evening much more organized and able to have good engagement during assessments. Pt. Denies SI/HI and verbally is able to contract for safety. Pt. Is eager to go home and continue school work. Pt. Pleasant and cooperative. Pt. Does report anxiety, but relieved with PRN medications. Pt. Still animated in expression, but overall clarity is improved.     Problem: Education: Goal: Emotional status will improve Outcome: Progressing Goal: Mental status will improve Outcome: Progressing   Problem: Health Behavior/Discharge Planning: Goal: Compliance with treatment plan for underlying cause of condition will improve Outcome: Progressing   Problem: Safety: Goal: Periods of time without injury will increase Outcome: Progressing   Problem: Education: Goal: Will be free of psychotic symptoms Outcome: Progressing

## 2018-05-14 NOTE — Progress Notes (Signed)
D:Patient denies SI/HI/AVH at this time. Pt appears calm and cooperative, and no distress noted.  A: All Personal items in locker returned to pt. Pt. Given extensive discharge education. Pt. Given discharge paperwork.   R:  Pt States she will comply with outpatient services, and take MEDS as prescribed. Pt escorted out of the building by staff.

## 2018-05-14 NOTE — Progress Notes (Signed)
Recreation Therapy Notes  INPATIENT RECREATION TR PLAN  Patient Details Name: Stacy Poole MRN: [id number] DOB: 30-Jan-1991 Today's Date: 05/14/2018  Rec Therapy Plan Is patient appropriate for Therapeutic Recreation?: Yes Treatment times per week: at least 3 Estimated Length of Stay: 5-7 days TR Treatment/Interventions: Group participation (Comment)  Discharge Criteria Pt will be discharged from therapy if:: Discharged Treatment plan/goals/alternatives discussed and agreed upon by:: Patient/family  Discharge Summary Short term goals set: Patient will focus on task/topic with 2 prompts from staff within 5 recreation therapy group sessions Short term goals met: Adequate for discharge Progress toward goals comments: Groups attended Which groups?: Communication, Coping skills, Other (Comment)(Happiness, Team work, Problem Solving) Reason goals not met: N/A Therapeutic equipment acquired: N/A Reason patient discharged from therapy: Discharge from hospital Pt/family agrees with progress & goals achieved: Yes Date patient discharged from therapy: 05/14/18   Melane Windholz 05/14/2018, 12:13 PM

## 2018-05-21 ENCOUNTER — Other Ambulatory Visit: Payer: Self-pay

## 2018-05-21 ENCOUNTER — Emergency Department (HOSPITAL_COMMUNITY)
Admission: EM | Admit: 2018-05-21 | Discharge: 2018-05-22 | Disposition: A | Discharge status: Other Acute Inpatient Hospital | Payer: BLUE CROSS/BLUE SHIELD | Attending: Emergency Medicine | Admitting: Emergency Medicine

## 2018-05-21 ENCOUNTER — Encounter (HOSPITAL_COMMUNITY): Payer: Self-pay

## 2018-05-21 DIAGNOSIS — F250 Schizoaffective disorder, bipolar type: Secondary | ICD-10-CM | POA: Insufficient documentation

## 2018-05-21 DIAGNOSIS — F22 Delusional disorders: Secondary | ICD-10-CM | POA: Insufficient documentation

## 2018-05-21 DIAGNOSIS — F17210 Nicotine dependence, cigarettes, uncomplicated: Secondary | ICD-10-CM | POA: Insufficient documentation

## 2018-05-21 DIAGNOSIS — F29 Unspecified psychosis not due to a substance or known physiological condition: Secondary | ICD-10-CM | POA: Diagnosis present

## 2018-05-21 DIAGNOSIS — R443 Hallucinations, unspecified: Secondary | ICD-10-CM | POA: Insufficient documentation

## 2018-05-21 LAB — RAPID URINE DRUG SCREEN, HOSP PERFORMED
Amphetamines: NOT DETECTED
BARBITURATES: NOT DETECTED
Benzodiazepines: NOT DETECTED
Cocaine: NOT DETECTED
OPIATES: NOT DETECTED
Tetrahydrocannabinol: NOT DETECTED

## 2018-05-21 LAB — COMPREHENSIVE METABOLIC PANEL
ALBUMIN: 4.4 g/dL (ref 3.5–5.0)
ALK PHOS: 39 U/L (ref 38–126)
ALT: 17 U/L (ref 0–44)
ANION GAP: 10 (ref 5–15)
AST: 20 U/L (ref 15–41)
BILIRUBIN TOTAL: 0.5 mg/dL (ref 0.3–1.2)
BUN: 10 mg/dL (ref 6–20)
CALCIUM: 10 mg/dL (ref 8.9–10.3)
CO2: 27 mmol/L (ref 22–32)
Chloride: 107 mmol/L (ref 98–111)
Creatinine, Ser: 0.73 mg/dL (ref 0.44–1.00)
GFR calc Af Amer: >60 mL/min (ref >60)
GFR calc non Af Amer: >60 mL/min (ref >60)
GLUCOSE: 116 mg/dL — AB (ref 70–99)
Potassium: 4.1 mmol/L (ref 3.5–5.1)
Sodium: 144 mmol/L (ref 135–145)
TOTAL PROTEIN: 7.7 g/dL (ref 6.5–8.1)

## 2018-05-21 LAB — CBC
HCT: 43 % (ref 36.0–46.0)
Hemoglobin: 14.3 g/dL (ref 12.0–15.0)
MCH: 29.2 pg (ref 26.0–34.0)
MCHC: 33.3 g/dL (ref 30.0–36.0)
MCV: 87.9 fL (ref 78.0–100.0)
PLATELETS: 315 10*3/uL (ref 150–400)
RBC: 4.89 MIL/uL (ref 3.87–5.11)
RDW: 13.9 % (ref 11.5–15.5)
WBC: 7.6 10*3/uL (ref 4.0–10.5)

## 2018-05-21 LAB — ETHANOL: Alcohol, Ethyl (B): <10 mg/dL (ref <10)

## 2018-05-21 LAB — I-STAT BETA HCG BLOOD, ED (MC, WL, AP ONLY)

## 2018-05-21 MED ORDER — LORAZEPAM 2 MG/ML IJ SOLN
1.0000 mg | Freq: Once | INTRAMUSCULAR | Status: AC
  Administered 2018-05-21: 1 mg via INTRAMUSCULAR
  Filled 2018-05-21: qty 1

## 2018-05-21 MED ORDER — OLANZAPINE 5 MG PO TBDP
5.0000 mg | ORAL_TABLET | Freq: Once | ORAL | Status: AC
  Administered 2018-05-21: 5 mg via ORAL
  Filled 2018-05-21: qty 1

## 2018-05-21 MED ORDER — DIPHENHYDRAMINE HCL 25 MG PO CAPS
25.0000 mg | ORAL_CAPSULE | Freq: Once | ORAL | Status: AC
  Administered 2018-05-21: 25 mg via ORAL
  Filled 2018-05-21: qty 1

## 2018-05-21 MED ORDER — ACETAMINOPHEN 325 MG PO TABS: 650.0000 mg | ORAL_TABLET | ORAL | Status: DC | PRN

## 2018-05-21 MED ORDER — ALUM & MAG HYDROXIDE-SIMETH 200-200-20 MG/5ML PO SUSP: 30.0000 mL | Freq: Four times a day (QID) | ORAL | Status: DC | PRN

## 2018-05-21 MED ORDER — HALOPERIDOL LACTATE 5 MG/ML IJ SOLN
5.0000 mg | Freq: Once | INTRAMUSCULAR | Status: AC
  Administered 2018-05-21: 5 mg via INTRAMUSCULAR
  Filled 2018-05-21: qty 1

## 2018-05-21 MED ORDER — ZOLPIDEM TARTRATE 5 MG PO TABS
5.0000 mg | ORAL_TABLET | Freq: Every evening | ORAL | Status: DC | PRN
  Administered 2018-05-21: 5 mg via ORAL
  Filled 2018-05-21: qty 1

## 2018-05-21 MED ORDER — ONDANSETRON HCL 4 MG PO TABS: 4.0000 mg | ORAL_TABLET | Freq: Three times a day (TID) | ORAL | Status: DC | PRN

## 2018-05-21 NOTE — ED Notes (Signed)
Pt eating dinner at present, no distress noted, calm, cooperative at present.  Watching TV.  Monitoring for safety, Q 15 min checks in effect.

## 2018-05-21 NOTE — ED Notes (Signed)
Patient took po medications without difficulty. 

## 2018-05-21 NOTE — Progress Notes (Signed)
Pt meets inpatient criteria per Stacy Chamberakia Starkes, NP. Referral information has been sent to the following hospitals for review: [facility]  [facility]  [facility]  [facility]  [facility]  [facility]  [facility]  [facility]   CSW will continue to assist with placement needs.   Wells GuilesSarah Suzann Lazaro, LCSW, LCAS Disposition CSW [facility]/TT[facility] [phone]

## 2018-05-21 NOTE — ED Notes (Signed)
Patient walking in the hallways talking loudly and using the phone occasionally.

## 2018-05-21 NOTE — ED Notes (Signed)
Patient sleeping in her bed now.

## 2018-05-21 NOTE — ED Notes (Signed)
Patient came up to nurses' station loud and demanding.

## 2018-05-21 NOTE — ED Notes (Signed)
Patient in and out of bed.

## 2018-05-21 NOTE — ED Triage Notes (Signed)
Her mother tells us that pt. Was recently seen at [facility] under [facility]. She also tells me that pt. Was prescribed Saphris "but it's not working".

## 2018-05-21 NOTE — ED Notes (Addendum)
Patient extremely restless, screaming at staff throwing trash in the hallway.  Nurse practitioner, Arby Barretteakia Stakes, notified.  Medication orders received.

## 2018-05-21 NOTE — Progress Notes (Addendum)
[facility] accepted [facility].   Dr. Sallyanne KusterUma Thotakura is the accepting/attending provider.   Call report to [phone] [name] @ [facility] ED notified.  Pt is involuntary and will be transported by law enforcement.   Pt may arrive at [facility] as soon as transportation is arranged. Admissions staff is aware that the earliest law enforcement may be able to transport is tomorrow (9/18) morning.   Wells GuilesSarah Eliz Nigg, LCSW, LCAS Disposition CSW [facility] [phone]

## 2018-05-21 NOTE — BH Assessment (Signed)
BHH Assessment Progress Note     Per Malachy Chamberakia Starkes, NP, Patient meets inpatient criteria

## 2018-05-21 NOTE — ED Notes (Signed)
Bed: WTR6 Expected date:  Expected time:  Means of arrival:  Comments: 

## 2018-05-21 NOTE — BH Assessment (Signed)
Assessment Note  Per EDP Report:  Patient is a 27 y.o. female with a history of tobacco abuse, insomnia, anxiety, schizoaffective disorder, PTSD, and polysubstance abuse who presents to the emergency department today with her mother for delusional behavior.  Majority of hx provided by patient's mother: Per patient's mother patient has a history of psychiatric condition, she recently had psychiatric admission under IVC to [facility] for similar with discharge home 1 week ago with prescription for 10 mg Saphris BID.  Patient seemed to be stabilized at time of discharge, she is been taking this medication as prescribed, however her mother feels that she has progressively deteriorated.  Patient has appeared to be hallucinating, responding to internal stimuli, and has been delusional. Mother reports that the patient went 24 hours without sleep and did not stop talking the entire time.  No specific alleviating or aggravating factors.  Patient's mother is concerned for her overall safety and feels she needs to be IVCed.   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  Patient denied any history of abuse or self-mutilation.  Patient  denied any drug or alcohol use to this Clinical research associatewriter, but told the RN Staff that she was using benzodiazepines and codeine.  After the TTS counselor left patient's room, patient could be heard outside the nurse's station yelling loudly.  "I need my medicine. All of you are incompetent.  I own you, you work for me."  [facility].  Patient is alert and oriented x 3, but not to situation.  Her mood is labile and she is generally uncooperative.  She is delusional and psychotic.  Patient's thoughts were disorganized, but her memory was intact.  Her judgment, insight and impulse control are all impaired and patient was very restless during the assessment.   Diagnosis: F25.0 Schizoaffective Disorder bipolar type  Past Medical History:  Past Medical History:  Diagnosis Date  . Acute ear infection   . Anxiety   . Arachnoid cyst   . Fifth disease   . Insomnia   . Mental disorder   . Mononucleosis   . PTSD (post-traumatic stress disorder)   . Substance abuse (HCC)     History reviewed. No pertinent surgical history.  Family History: History reviewed. No pertinent family history.  Social History:  reports that she has been smoking cigarettes. She has been smoking about 0.50 packs per day. She has never used smokeless tobacco. She reports that she drinks about 10.0 standard drinks of alcohol per week. She reports that she does not use drugs.  Additional Social History:  Alcohol / Drug Use Pain Medications:  see MAR Prescriptions: see MAR Over the Counter: see MAR History of alcohol / drug use?: Yes Longest period of sobriety (when/how long): unable to assessment Substance #1 Name of Substance 1: patient denied SA use to TTS, but told Rn Staff that she is using benzodiaepines and codeine. Patient is currently psychotic and very guarded, unable to aseess current useage  CIWA: CIWA-Ar BP: 134/89 Pulse Rate: 96 COWS:    Allergies:  Allergies  Allergen  Reactions  . Tetracyclines & Related Other (See Comments)    Ear popping, couldn't move neck/back, and had blind spots/double vision    Home Medications:  (Not in a hospital admission)  OB/GYN Status:  No LMP recorded (lmp unknown).  General Assessment Data [facility] of [facility]: [facility] TTS Assessment: In system Is this a Tele or Face-to-Face Assessment?: Face-to-Face Is this an Initial Assessment or a Re-assessment for this encounter?: Initial Assessment Patient Accompanied by:: N/A Language Other than English: No Living Arrangements: Other (Comment)(patient states that she is homeless) What gender do you identify as?: Female Marital status: Single Maiden name: Nechama Guard) Pregnancy Status: No Living Arrangements: Other (Comment)(homeless) Can pt return to current living arrangement?: Yes Admission Status: Involuntary Petitioner: Family member Is patient capable of signing voluntary admission?: No Referral Source: Self/Family/Friend Insurance type: Biomedical scientist)     Crisis Care Plan Living Arrangements: Other (Comment)(homeless) Legal Guardian: Other: Name of Psychiatrist: Dr Junie Panning Name of Therapist: Evette Doffing  Education Status Is patient currently in school?: No Is the patient employed, unemployed or receiving disability?: Unemployed  Risk to self with the past 6 months Suicidal Ideation: No Has patient been a risk to self within the past 6 months prior to admission? : (unknown) Suicidal Intent: No Has patient had any suicidal intent within the past 6 months prior to admission? : (unknown) Is patient at risk for suicide?: Yes Suicidal Plan?: No Has patient had any suicidal plan within the past 6 months prior to admission? : (unknown) Access to Means: No What has been your use of drugs/alcohol within the last 12 months?: (benzos and codeine) Previous Attempts/Gestures: (unable to assess) How many times?: (unknown) Other Self Harm Risks: (homeless and mentally  unstable) Triggers for Past Attempts: None known Intentional Self Injurious Behavior: None Family Suicide History: Unable to assess Recent stressful life event(s): Conflict (Comment) Persecutory voices/beliefs?: Yes Depression: Yes Depression Symptoms: Despondent, Isolating, Loss of interest in usual pleasures, Feeling worthless/self pity Substance abuse history and/or treatment for substance abuse?: No Suicide prevention information given to non-admitted patients: Not applicable  Risk to Others within the past 6 months Homicidal Ideation: No Does patient have any lifetime risk of violence toward others beyond the six months prior to admission? : No Thoughts of Harm to Others: No-Not Currently Present/Within Last 6 Months Current Homicidal Intent: No Current Homicidal Plan: No Access to Homicidal Means: No Identified Victim: none History of harm to others?: No Assessment of Violence: None Noted Violent Behavior Description: none reported Does patient have access to weapons?: No Criminal Charges Pending?: No Does patient have a court date: No Is patient on probation?: No  Psychosis Hallucinations: None noted Delusions: Grandiose  Mental Status Report Appearance/Hygiene: Disheveled Eye Contact: Good Motor Activity: Restlessness Speech: Argumentative, Pressured, Loud Level of Consciousness: Restless Mood: Depressed, Anxious, Labile, Suspicious, Ambivalent Affect: Appropriate to circumstance, Depressed Anxiety Level: Moderate Thought Processes: Coherent, Relevant, Flight of Ideas Judgement: Impaired Orientation: Person, Place, Time Obsessive Compulsive Thoughts/Behaviors: Severe  Cognitive Functioning Concentration: Decreased Memory: Recent Intact, Remote Intact Is  patient IDD: No Insight: Poor Impulse Control: Poor Appetite: Good Have you had any weight changes? : No Change Sleep: Decreased Total Hours of Sleep: (no sleep at all last pm) Vegetative Symptoms: Not  bathing, Decreased grooming  ADLScreening [facility] Assessment Services) Patient's cognitive ability adequate to safely complete daily activities?: Yes Patient able to express need for assistance with ADLs?: Yes Independently performs ADLs?: Yes (appropriate for developmental age)  Prior Inpatient Therapy Prior Inpatient Therapy: Yes Prior Therapy Dates: last week Prior Therapy Facilty/Provider(s): ([facility]) Reason for Treatment: (schizoaffective disorder)  Prior Outpatient Therapy Prior Outpatient Therapy: Yes Prior Therapy Dates: (active) Prior Therapy Facilty/Provider(s): (Dr Junie Panning and Tomma Rakers) Reason for Treatment: (schizoaffective disorder) Does patient have an ACCT team?: No Does patient have Intensive In-House Services?  : No Does patient have [facility] services? : No Does patient have P4CC services?: No  ADL Screening (condition at time of admission) Patient's cognitive ability adequate to safely complete daily activities?: Yes Is the patient deaf or have difficulty hearing?: No Does the patient have difficulty seeing, even when wearing glasses/contacts?: No Does the patient have difficulty concentrating, remembering, or making decisions?: No Patient able to express need for assistance with ADLs?: Yes Does the patient have difficulty dressing or bathing?: No Independently performs ADLs?: Yes (appropriate for developmental age) Does the patient have difficulty walking or climbing stairs?: No Weakness of Legs: None Weakness of Arms/Hands: None  Home Assistive Devices/Equipment Home Assistive Devices/Equipment: None  Therapy Consults (therapy consults require a physician order) PT Evaluation Needed: No OT Evalulation Needed: No SLP Evaluation Needed: No Abuse/Neglect Assessment (Assessment to be complete while patient is alone) Abuse/Neglect Assessment Can Be Completed: Yes Physical Abuse: Denies Verbal Abuse: Denies Sexual Abuse: Denies Exploitation of  patient/patient's resources: Denies Values / Beliefs Cultural Requests During Hospitalization: None Spiritual Requests During Hospitalization: None Consults Spiritual Care Consult Needed: No Social Work Consult Needed: No Merchant navy officer (For Healthcare) Does Patient Have a Medical Advance Directive?: No Would patient like information on creating a medical advance directive?: No - Patient declined Nutrition Screen- [facility] Has the patient recently lost weight without trying?: No Has the patient been eating poorly because of a decreased appetite?: No Malnutrition Screening Tool Score: 0        Disposition: Per Malachy Chamber, NP, patient is recommended for inpatient treatment. Disposition Initial Assessment Completed for this Encounter: Yes Disposition of Patient: Admit Type of inpatient treatment program: Adult  On Site Evaluation by:   Reviewed with Physician:    Arnoldo Lenis Infant Doane 05/21/2018 3:53 PM

## 2018-05-21 NOTE — ED Notes (Signed)
Family at bedside. PA met with family. VS completed by PA

## 2018-05-21 NOTE — ED Notes (Signed)
[facility] Expected date:  Expected time:  Means of arrival:  Comments: Hold:Triage 6

## 2018-05-21 NOTE — ED Notes (Signed)
[facility] IN THE AM, PT IS IVC.

## 2018-05-21 NOTE — ED Notes (Signed)
Per SAmi PA, in process of filling out IVC papers on pt.

## 2018-05-21 NOTE — ED Triage Notes (Signed)
She tells us she has "bipolar; and it's getting real bad". She is here with her mother. She is paranoid, and needs much cajoling to obtain her v.s. She is otherwise healthy-looking.

## 2018-05-21 NOTE — ED Notes (Signed)
Malachy Chamberakia Starkes NP notified of patient's behavior.  Med orders given.
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
## 2018-05-21 NOTE — ED Provider Notes (Signed)
[facility]-EMERGENCY DEPT Provider Note   CSN: [id number] Arrival date & time: 05/21/18  1119     History   Chief Complaint Chief Complaint  Patient presents with  . Paranoid    HPI Oliver PilaStephanie W Wehmeyer is a 27 y.o. female with a history of tobacco abuse, insomnia, anxiety, schizoaffective disorder, PTSD, and polysubstance abuse who presents to the emergency department today with her mother for delusional behavior.  Majority of hx provided by patient's mother: Per patient's mother patient has a history of psychiatric condition, she recently had psychiatric admission under IVC to [facility] for similar with discharge home 1 week ago with prescription for 10 mg Saphris BID.  Patient seemed to be stabilized at time of discharge, she has been taking this medication as prescribed, however her mother feels that she has progressively deteriorated.  Patient has appeared to be hallucinating, responding to internal stimuli, and has been delusional. Mother reports that the patient went 24 hours without sleep and did not stop talking the entire time.  No specific alleviating or aggravating factors.  Patient's mother is concerned for her overall safety and feels she needs to be IVCed.   Level 5 caveat secondary to patient's psychiatric condition.  Patient is tangential, delusional, and appears very anxious.  She informs me that she runs the [employer], speaks with the president regularly, and that she is responsible for the deaths of 1000s of people.  She is repeatedly asking me to take her blood pressure.  She states she is concerned that she has no blood as we have drawn blood to check labs.  She denies hallucinations, suicidal ideation, or homicidal ideation to me.  She does not answer all of my questions, she responds with unrelated answers.  HPI  Past Medical History:  Diagnosis Date  . Acute ear infection   . Anxiety   . Arachnoid cyst   . Fifth disease   . Insomnia   .  Mental disorder   . Mononucleosis   . PTSD (post-traumatic stress disorder)   . Substance abuse [facility](HCC)     Patient Active Problem List   Diagnosis Date Noted  . Schizoaffective disorder, bipolar type (HCC) 05/06/2018  . Tobacco use disorder 05/06/2018  . 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 Alcohol use disorder, moderate, dependence (HCC) 11/15/2015  . Cannabis use disorder, severe, dependence (HCC) 11/15/2015    No past surgical history on file.   OB History   None      Home Medications    Prior to Admission medications   Medication Sig Start Date End Date Taking? Authorizing Provider  Asenapine Maleate (SAPHRIS) 10 MG SUBL Place 1 tablet (10 mg total) under the tongue 2 (two) times daily. 05/13/18   McNew, Ileene HutchinsonHolly R, MD  Coenzyme Q10 (COQ-10 PO) Take 1 tablet by mouth daily.    [provider]    Family History No family history on file.  Social History Social History   Tobacco Use  . Smoking status: Current Every Day Smoker    Packs/day: 0.50    Types: Cigarettes  . Smokeless tobacco: Never Used  Substance Use Topics  . Alcohol use: Yes    Alcohol/week: 10.0 standard drinks    Types: 10 Shots of liquor per week    Comment: daily  . Drug use: No    Types: Codeine, Benzodiazepines, Other-see comments    Comment: narcotics last used 09/25/11 0100,      Allergies   Tetracyclines &  related   Review of Systems Review of Systems  Unable to perform ROS: Psychiatric disorder     Physical Exam Updated Vital Signs BP (!) 145/98 (BP Location: Left Arm)   Pulse (!) 113   Temp 99.7 F (37.6 C) (Oral)   Resp 16   LMP  (LMP Unknown)   SpO2 100%   Physical Exam  Constitutional: She appears well-developed and well-nourished.  Non-toxic appearance.  HENT:  Head: Normocephalic and atraumatic.  Eyes: Conjunctivae are normal. Right eye exhibits no discharge. Left eye exhibits no discharge.  Neck: Neck supple.    Cardiovascular: Normal rate and regular rhythm.  Pulmonary/Chest: Effort normal and breath sounds normal. No respiratory distress. She has no wheezes. She has no rhonchi. She has no rales.  Respiration even and unlabored  Abdominal: Soft. She exhibits no distension. There is no tenderness.  Neurological: She is alert.  Clear speech.   Skin: Skin is warm and dry. No rash noted.  Psychiatric: Her mood appears anxious. Her speech is rapid and/or pressured and tangential. She is agitated. Thought content is delusional. She expresses impulsivity.  Nursing note and vitals reviewed.   ED Treatments / Results  Labs (all labs ordered are listed, but only abnormal results are displayed) Labs Reviewed  COMPREHENSIVE METABOLIC PANEL - Abnormal; Notable for the following components:      Result Value   Glucose, Bld 116 (*)    All other components within normal limits  ETHANOL  CBC  RAPID URINE DRUG SCREEN, HOSP PERFORMED  I-STAT BETA HCG BLOOD, ED ([facility], [facility], [facility] ONLY)    EKG EKG Interpretation  Date/Time:  Tuesday May 21 2018 13:54:55 EDT Ventricular Rate:  96 PR Interval:    QRS Duration: 81 QT Interval:  326 QTC Calculation: 412 R Axis:   10 Text Interpretation:  Sinus rhythm nl intervals similar to prior 9/19 Confirmed by Meridee Score [phone]) on 05/21/2018 1:57:46 PM Also confirmed by Meridee Score [phone]), editor Barbette Hair [phone])  on 05/21/2018 2:14:02 PM   Radiology No results found.  Procedures Procedures (including critical care time)  Medications Ordered in ED Medications - No data to display   Initial Impression / Assessment and Plan / ED Course  I have reviewed the triage vital signs and the nursing notes.  Pertinent labs & imaging results that were available during my care of the patient were reviewed by me and considered in my medical decision making (see chart for details).   Patient presents to the emergency department with her mother due to  delusions and hallucinations.  She has a psychiatric history with recent admission for similar presentation.  She has been taking her Saphris as prescribed.  On my assessment patient is delusional, anxious, agitated, and appears to be impulsive.  She is not answering all of my questions providing tangential responses.  Patient appears to be acutely psychotic, feel she is a risk to herself and potentially others,  IVC paperwork has been filled out, patient's mother agreement.   Patient screening labs been reviewed and are grossly unremarkable.  EKG reviewed normal sinus rhythm, QTtc WNL. Her initial tachycardia and hypertension resolved on repeat vitals Patient is medically clear for TTS evaluation. Holding orders placed. Patient cooperative throughout my assessment, will hold off on psychiatrtic medications at this time and defer to [place] team given patient responding poorly to [facility] outpatient- per chart review nursing staff has spoken with  psychiatry provider Malachy Chamber NP and received orders for psychiatric medications.   Vitals:  05/21/18 1151 05/21/18 1257  BP: (!) 145/98 134/89  Pulse: (!) 113 96  Resp: 16   Temp: 99.7 F (37.6 C)   SpO2: 100% 100%     Final Clinical Impressions(s) / ED Diagnoses   Final diagnoses:  Delusions [facility])    ED Discharge Orders    None       Cherly Anderson, PA-C 05/21/18 1557    Terrilee Files, MD 05/21/18 1807

## 2018-05-22 ENCOUNTER — Other Ambulatory Visit: Payer: Self-pay

## 2018-05-22 MED ORDER — HYDROXYZINE HCL 25 MG PO TABS
ORAL_TABLET | ORAL | Status: AC
  Filled 2018-05-22: qty 2

## 2018-05-22 MED ORDER — HYDROXYZINE HCL 25 MG PO TABS
50.0000 mg | ORAL_TABLET | Freq: Once | ORAL | Status: AC
  Administered 2018-05-22: 50 mg via ORAL

## 2018-05-22 NOTE — ED Notes (Signed)
Transported to [facility] by [facility]. All belongings returned to pt. Pt was angry about leaving, especially with this Clinical research associatewriter because it was this Clinical research associatewriter who informed her of her transfer. Her mother wanted her to go to a hospital, but not [facility]. [facility] and knows this Sheriff from previous transfers. Pt signed consent for us to speak with her mother.

## 2018-05-22 NOTE — ED Notes (Signed)
Attempted to call report to [facility]. Felicia said that the nurses are counting medicatons right now and will return the call.

## 2018-08-04 ENCOUNTER — Other Ambulatory Visit: Payer: Self-pay | Admitting: Psychiatry

## 2018-08-05 ENCOUNTER — Other Ambulatory Visit: Payer: Self-pay

## 2018-08-05 NOTE — Telephone Encounter (Signed)
Review paper chart  

## 2019-02-21 ENCOUNTER — Other Ambulatory Visit: Payer: Self-pay

## 2019-02-21 ENCOUNTER — Emergency Department (HOSPITAL_COMMUNITY)
Admission: EM | Admit: 2019-02-21 | Discharge: 2019-02-23 | Disposition: A | Discharge status: Home / Self Care | Payer: BC Managed Care – PPO | Attending: Emergency Medicine | Admitting: Emergency Medicine

## 2019-02-21 DIAGNOSIS — F22 Delusional disorders: Secondary | ICD-10-CM | POA: Diagnosis not present

## 2019-02-21 DIAGNOSIS — F250 Schizoaffective disorder, bipolar type: Secondary | ICD-10-CM | POA: Diagnosis not present

## 2019-02-21 DIAGNOSIS — F1010 Alcohol abuse, uncomplicated: Secondary | ICD-10-CM

## 2019-02-21 DIAGNOSIS — Z20828 Contact with and (suspected) exposure to other viral communicable diseases: Secondary | ICD-10-CM | POA: Insufficient documentation

## 2019-02-21 DIAGNOSIS — F29 Unspecified psychosis not due to a substance or known physiological condition: Secondary | ICD-10-CM | POA: Diagnosis not present

## 2019-02-21 DIAGNOSIS — Z008 Encounter for other general examination: Secondary | ICD-10-CM | POA: Diagnosis present

## 2019-02-21 DIAGNOSIS — Z79899 Other long term (current) drug therapy: Secondary | ICD-10-CM | POA: Insufficient documentation

## 2019-02-21 DIAGNOSIS — F17210 Nicotine dependence, cigarettes, uncomplicated: Secondary | ICD-10-CM | POA: Diagnosis not present

## 2019-02-21 DIAGNOSIS — F10150 Alcohol abuse with alcohol-induced psychotic disorder with delusions: Secondary | ICD-10-CM

## 2019-02-21 LAB — COMPREHENSIVE METABOLIC PANEL
ALT: 26 U/L (ref 0–44)
AST: 34 U/L (ref 15–41)
Albumin: 4.8 g/dL (ref 3.5–5.0)
Alkaline Phosphatase: 43 U/L (ref 38–126)
Anion gap: 11 (ref 5–15)
BUN: 9 mg/dL (ref 6–20)
CO2: 26 mmol/L (ref 22–32)
Calcium: 9 mg/dL (ref 8.9–10.3)
Chloride: 103 mmol/L (ref 98–111)
Creatinine, Ser: 0.62 mg/dL (ref 0.44–1.00)
GFR calc Af Amer: >60 mL/min (ref >60)
GFR calc non Af Amer: >60 mL/min (ref >60)
Glucose, Bld: 137 mg/dL — ABNORMAL HIGH (ref 70–99)
Potassium: 4.1 mmol/L (ref 3.5–5.1)
Sodium: 140 mmol/L (ref 135–145)
Total Bilirubin: 0.4 mg/dL (ref 0.3–1.2)
Total Protein: 7.9 g/dL (ref 6.5–8.1)

## 2019-02-21 LAB — CBC WITH DIFFERENTIAL/PLATELET
Abs Immature Granulocytes: 0.03 10*3/uL (ref 0.00–0.07)
Basophils Absolute: 0 10*3/uL (ref 0.0–0.1)
Basophils Relative: 1 %
Eosinophils Absolute: 0.1 10*3/uL (ref 0.0–0.5)
Eosinophils Relative: 1 %
HCT: 42.4 % (ref 36.0–46.0)
Hemoglobin: 13.7 g/dL (ref 12.0–15.0)
Immature Granulocytes: 0 %
Lymphocytes Relative: 15 %
Lymphs Abs: 1.3 10*3/uL (ref 0.7–4.0)
MCH: 29.3 pg (ref 26.0–34.0)
MCHC: 32.3 g/dL (ref 30.0–36.0)
MCV: 90.6 fL (ref 80.0–100.0)
Monocytes Absolute: 0.3 10*3/uL (ref 0.1–1.0)
Monocytes Relative: 3 %
Neutro Abs: 7 10*3/uL (ref 1.7–7.7)
Neutrophils Relative %: 80 %
Platelets: 305 10*3/uL (ref 150–400)
RBC: 4.68 MIL/uL (ref 3.87–5.11)
RDW: 15 % (ref 11.5–15.5)
WBC: 8.7 10*3/uL (ref 4.0–10.5)
nRBC: 0 % (ref 0.0–0.2)

## 2019-02-21 LAB — I-STAT BETA HCG BLOOD, ED (MC, WL, AP ONLY): I-stat hCG, quantitative: <5 m[IU]/mL (ref <5)

## 2019-02-21 LAB — RAPID URINE DRUG SCREEN, HOSP PERFORMED
Amphetamines: NOT DETECTED
Barbiturates: NOT DETECTED
Benzodiazepines: NOT DETECTED
Cocaine: NOT DETECTED
Opiates: NOT DETECTED
Tetrahydrocannabinol: NOT DETECTED

## 2019-02-21 LAB — SARS CORONAVIRUS 2 BY RT PCR (HOSPITAL ORDER, PERFORMED IN ~~LOC~~ HOSPITAL LAB): SARS Coronavirus 2: NEGATIVE

## 2019-02-21 LAB — ACETAMINOPHEN LEVEL: Acetaminophen (Tylenol), Serum: <10 ug/mL — ABNORMAL LOW (ref 10–30)

## 2019-02-21 LAB — SALICYLATE LEVEL: Salicylate Lvl: <7 mg/dL (ref 2.8–30.0)

## 2019-02-21 LAB — ETHANOL: Alcohol, Ethyl (B): 301 mg/dL (ref <10)

## 2019-02-21 MED ORDER — ONDANSETRON HCL 4 MG PO TABS: 4.0000 mg | ORAL_TABLET | Freq: Three times a day (TID) | ORAL | Status: DC | PRN

## 2019-02-21 MED ORDER — HALOPERIDOL LACTATE 5 MG/ML IJ SOLN
10.0000 mg | Freq: Once | INTRAMUSCULAR | Status: AC
  Administered 2019-02-21: 11:00:00 10 mg via INTRAMUSCULAR
  Filled 2019-02-21: qty 2

## 2019-02-21 MED ORDER — ACETAMINOPHEN 325 MG PO TABS
650.0000 mg | ORAL_TABLET | ORAL | Status: DC | PRN
  Administered 2019-02-21 – 2019-02-22 (×2): 650 mg via ORAL
  Filled 2019-02-21 (×2): qty 2

## 2019-02-21 MED ORDER — FLUOXETINE HCL 20 MG PO CAPS
40.0000 mg | ORAL_CAPSULE | Freq: Every day | ORAL | Status: DC
  Administered 2019-02-22 – 2019-02-23 (×2): 40 mg via ORAL
  Filled 2019-02-21 (×2): qty 2

## 2019-02-21 MED ORDER — HYDROXYZINE HCL 25 MG PO TABS
25.0000 mg | ORAL_TABLET | Freq: Four times a day (QID) | ORAL | Status: DC | PRN
  Administered 2019-02-22 – 2019-02-23 (×4): 25 mg via ORAL
  Filled 2019-02-21 (×4): qty 1

## 2019-02-21 MED ORDER — ALUM & MAG HYDROXIDE-SIMETH 200-200-20 MG/5ML PO SUSP: 30.0000 mL | Freq: Four times a day (QID) | ORAL | Status: DC | PRN

## 2019-02-21 MED ORDER — TRAZODONE HCL 50 MG PO TABS: 50.0000 mg | ORAL_TABLET | Freq: Every evening | ORAL | Status: DC | PRN

## 2019-02-21 MED ORDER — LORAZEPAM 2 MG/ML IJ SOLN
1.0000 mg | Freq: Once | INTRAMUSCULAR | Status: AC
  Administered 2019-02-21: 11:00:00 1 mg via INTRAMUSCULAR
  Filled 2019-02-21: qty 1

## 2019-02-21 MED ORDER — VITAMIN B-1 100 MG PO TABS
100.0000 mg | ORAL_TABLET | Freq: Every day | ORAL | Status: DC
  Administered 2019-02-22 – 2019-02-23 (×2): 100 mg via ORAL
  Filled 2019-02-21 (×2): qty 1

## 2019-02-21 MED ORDER — LORAZEPAM 1 MG PO TABS: 0.0000 mg | ORAL_TABLET | Freq: Two times a day (BID) | ORAL | Status: DC

## 2019-02-21 MED ORDER — FLUOXETINE HCL 20 MG PO CAPS
20.0000 mg | ORAL_CAPSULE | Freq: Every day | ORAL | Status: DC
  Administered 2019-02-21 – 2019-02-22 (×2): 20 mg via ORAL
  Filled 2019-02-21 (×2): qty 1

## 2019-02-21 MED ORDER — THIAMINE HCL 100 MG/ML IJ SOLN: 100.0000 mg | Freq: Every day | INTRAMUSCULAR | Status: DC

## 2019-02-21 MED ORDER — LORAZEPAM 2 MG/ML IJ SOLN: 0.0000 mg | Freq: Four times a day (QID) | INTRAMUSCULAR | Status: DC

## 2019-02-21 MED ORDER — LORAZEPAM 2 MG/ML IJ SOLN: 0.0000 mg | Freq: Two times a day (BID) | INTRAMUSCULAR | Status: DC

## 2019-02-21 MED ORDER — PERPHENAZINE 2 MG PO TABS
2.0000 mg | ORAL_TABLET | Freq: Two times a day (BID) | ORAL | Status: DC | PRN
  Administered 2019-02-22 – 2019-02-23 (×2): 2 mg via ORAL
  Filled 2019-02-21 (×4): qty 1

## 2019-02-21 MED ORDER — LORAZEPAM 1 MG PO TABS: 0.0000 mg | ORAL_TABLET | Freq: Four times a day (QID) | ORAL | Status: DC

## 2019-02-21 MED ORDER — NICOTINE 21 MG/24HR TD PT24
21.0000 mg | MEDICATED_PATCH | Freq: Every day | TRANSDERMAL | Status: DC
  Administered 2019-02-22 – 2019-02-23 (×2): 21 mg via TRANSDERMAL
  Filled 2019-02-21 (×2): qty 1

## 2019-02-21 MED ORDER — DIPHENHYDRAMINE HCL 50 MG/ML IJ SOLN
25.0000 mg | Freq: Once | INTRAMUSCULAR | Status: AC
  Administered 2019-02-21: 11:00:00 25 mg via INTRAMUSCULAR
  Filled 2019-02-21: qty 1

## 2019-02-21 NOTE — ED Notes (Signed)
Pt work up to eat. Pt went back to sleep, pt easily awoke.  Sitter at bedside.

## 2019-02-21 NOTE — BH Assessment (Signed)
[facility] Assessment Progress Note  Case was staffed with Parks NP who recommended patient be observed and monitored.    

## 2019-02-21 NOTE — ED Notes (Signed)
Pt signed a consent to call mother, Avalee Castrellon.

## 2019-02-21 NOTE — ED Triage Notes (Signed)
Pt to room 32. Pt loud, slurred speech, labile, restless. Cooperative with VS. Pt stated drunk, pt can tell how much she drank. Pt stated took psycodelic drug. Pt demanding. Denied SI/HI.

## 2019-02-21 NOTE — ED Notes (Signed)
Awake now after sleeping most of the shift from receiving IM meds for agitation. She states she is historically an "angry drunk" and "I may have insulted some people" Feeling better, would like to be discharged in am, doesn't feel she needs to be hospitalized. She was told the TTS recommendation was to spend night for observation and she is accepting of this. Ate most of her dinner and is taking fluids liberally. Will continue to monitor for safety. Sitter at bedside.
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
## 2019-02-21 NOTE — ED Notes (Signed)
Had on several pieces of jewelry and a headband, she was asked for them as they are not allowed on the unit. She gave them up without difficulty and they were added to her personal items in her locker until she is discharged. She has been pleasant all this shift. She was told her mom called to check on her. CIWA=2 no Ativan required.
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
## 2019-02-22 DIAGNOSIS — F1010 Alcohol abuse, uncomplicated: Secondary | ICD-10-CM | POA: Insufficient documentation

## 2019-02-22 DIAGNOSIS — F250 Schizoaffective disorder, bipolar type: Secondary | ICD-10-CM

## 2019-02-22 DIAGNOSIS — F29 Unspecified psychosis not due to a substance or known physiological condition: Secondary | ICD-10-CM | POA: Insufficient documentation

## 2019-02-22 DIAGNOSIS — F10150 Alcohol abuse with alcohol-induced psychotic disorder with delusions: Secondary | ICD-10-CM

## 2019-02-22 DIAGNOSIS — F22 Delusional disorders: Secondary | ICD-10-CM

## 2019-02-22 MED ORDER — STERILE WATER FOR INJECTION IJ SOLN
INTRAMUSCULAR | Status: AC
  Filled 2019-02-22: qty 10

## 2019-02-22 MED ORDER — ULIPRISTAL ACETATE 30 MG PO TABS
30.0000 mg | ORAL_TABLET | Freq: Once | ORAL | Status: AC
  Administered 2019-02-22: 17:00:00 30 mg via ORAL
  Filled 2019-02-22 (×2): qty 1

## 2019-02-22 MED ORDER — CEFTRIAXONE SODIUM 250 MG IJ SOLR
250.0000 mg | Freq: Once | INTRAMUSCULAR | Status: AC
  Administered 2019-02-22: 250 mg via INTRAMUSCULAR
  Filled 2019-02-22: qty 250

## 2019-02-22 MED ORDER — GABAPENTIN 300 MG PO CAPS
300.0000 mg | ORAL_CAPSULE | Freq: Two times a day (BID) | ORAL | Status: DC
  Administered 2019-02-22 – 2019-02-23 (×3): 300 mg via ORAL
  Filled 2019-02-22 (×3): qty 1

## 2019-02-22 MED ORDER — AZITHROMYCIN 250 MG PO TABS
1000.0000 mg | ORAL_TABLET | Freq: Once | ORAL | Status: AC
  Administered 2019-02-22: 17:00:00 1000 mg via ORAL
  Filled 2019-02-22: qty 4

## 2019-02-22 NOTE — ED Notes (Signed)
Patient asked nurse whether she could take a Plan B pill if her mother brings it in.  Checked with charge nurse and that is not allowed.  Patient cannot take pills offered by family members for any reason.

## 2019-02-22 NOTE — ED Notes (Signed)
Patient having psychiatry assessment by telepsych.  Counselor Dave in room with patient during assessment.

## 2019-02-22 NOTE — ED Notes (Signed)
Reports feeling anxious and paranoid. Conversation and behavior is appropriate. States she is feeling restless. No other complaints voiced. She asked for Trilafon when writer asked if she wanted me to give her something for her complaints. Trilafon given as ordered prn.

## 2019-02-22 NOTE — ED Notes (Signed)
Pt. Lunch tray has arrived.

## 2019-02-22 NOTE — ED Notes (Signed)
Pt. Seems to become agitated after talking to mom on the phone. Pasting back and forth in their own rm.
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
## 2019-02-22 NOTE — ED Notes (Addendum)
Patient very anxious pacing in room, brushing her teeth over and over, and practicing yoga.  Her hands are visibly shaking.  Sitter alerted nurse of this behavior.  Nurse talked to patient and she said she was not anxious she just likes to move around in her room.  Patient requested if she could have an order for neurontin. Reminded patient that she had neurontin 300mg  at 1052 this morning.

## 2019-02-22 NOTE — Progress Notes (Signed)
Received Stacy Poole this PM in her room awake with the sitter at the bedside. She requested vistaril for anxiety and later was compliant with her PM medications. She denied all of the psychiatric symptoms and looking forward to being discharge in the AM. She showered this AM at 0600 hrs.

## 2019-02-22 NOTE — ED Notes (Signed)
Pt. Done with TTS and asked to talk to mom. Phone was given to her.

## 2019-02-22 NOTE — ED Notes (Signed)
Pt. Asked to talk to mom on the phone. Explained the rules of using the phone, 3 times a day and for only a certain amount of time. Pt. Understood.

## 2019-02-22 NOTE — ED Notes (Signed)
Went back to clean pts. RM. Counted crayons, pt. Now has 8/10. Checked the entire RM, nowhere to be found. Told pt. That if the remaining 8 crayons are gone by shift change, she will not have anything else besides anything necessary in her RM. Pt. Perfectly understood this.

## 2019-02-22 NOTE — ED Notes (Signed)
Pt. Asked if she could color or do something while she waited for the doctor. Was given 2 color sheets, one word search book and 10 crayons. Will be monitoring.

## 2019-02-22 NOTE — ED Notes (Signed)
Pt.s breakfast came in, pt. Seemed coherent.

## 2019-02-22 NOTE — ED Notes (Signed)
Patient taking a shower.

## 2019-02-22 NOTE — ED Notes (Signed)
Pt. Talking on the phone with mother, at the moment. Mother called pt.

## 2019-02-22 NOTE — ED Notes (Signed)
Patient awake oriented talking pleasantly with nurse and sitter. Patient anxious to speak with doctor because she wants to go home.
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
## 2019-02-23 MED ORDER — PERPHENAZINE 4 MG PO TABS: 4.0000 mg | ORAL_TABLET | Freq: Two times a day (BID) | ORAL | Status: DC
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
## 2019-02-23 NOTE — ED Notes (Signed)
Pt is a little anxious. Denies SI/HI, AVH. Does not appear to be responding. Would like to go home.

## 2019-02-23 NOTE — ED Notes (Signed)
Did request perphenazine PRN for feeling a little moody and said that it helps her with alcohol withdrawal.

## 2019-02-23 NOTE — ED Notes (Signed)
Pt. Breakfast has arrived, currently eating her food.

## 2019-02-23 NOTE — ED Notes (Signed)
Pt showered and in bed resting comfortably

## 2019-02-23 NOTE — ED Notes (Signed)
Pt discharged home. Discharged instructions read to pt who verbalized understanding. All belongings returned to pt. Denies SI/HI, is not delusional and not responding to internal stimuli. Escorted pt to the ED exit.   Resources for community substance-dependence help given to pt by this Probation officer.

## 2019-03-05 ENCOUNTER — Ambulatory Visit (HOSPITAL_COMMUNITY): Payer: BC Managed Care – PPO | Admitting: Licensed Clinical Social Worker

## 2019-03-05 ENCOUNTER — Encounter (HOSPITAL_COMMUNITY): Payer: Self-pay | Admitting: Psychology

## 2019-03-05 ENCOUNTER — Other Ambulatory Visit: Payer: Self-pay

## 2019-03-05 ENCOUNTER — Ambulatory Visit (INDEPENDENT_AMBULATORY_CARE_PROVIDER_SITE_OTHER): Payer: BC Managed Care – PPO | Admitting: Psychology

## 2019-03-05 DIAGNOSIS — F1020 Alcohol dependence, uncomplicated: Secondary | ICD-10-CM | POA: Diagnosis not present
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
## 2019-03-18 ENCOUNTER — Ambulatory Visit (INDEPENDENT_AMBULATORY_CARE_PROVIDER_SITE_OTHER): Payer: BC Managed Care – PPO | Admitting: Licensed Clinical Social Worker

## 2019-03-18 ENCOUNTER — Other Ambulatory Visit: Payer: Self-pay

## 2019-03-18 ENCOUNTER — Encounter (HOSPITAL_COMMUNITY): Payer: Self-pay | Admitting: Licensed Clinical Social Worker

## 2019-03-18 DIAGNOSIS — F250 Schizoaffective disorder, bipolar type: Secondary | ICD-10-CM | POA: Diagnosis not present

## 2019-03-18 DIAGNOSIS — F1020 Alcohol dependence, uncomplicated: Secondary | ICD-10-CM
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
## 2019-03-25 ENCOUNTER — Ambulatory Visit (INDEPENDENT_AMBULATORY_CARE_PROVIDER_SITE_OTHER): Payer: BC Managed Care – PPO | Admitting: Licensed Clinical Social Worker

## 2019-03-25 ENCOUNTER — Encounter (HOSPITAL_COMMUNITY): Payer: Self-pay | Admitting: Licensed Clinical Social Worker

## 2019-03-25 ENCOUNTER — Other Ambulatory Visit: Payer: Self-pay

## 2019-03-25 DIAGNOSIS — F250 Schizoaffective disorder, bipolar type: Secondary | ICD-10-CM | POA: Diagnosis not present

## 2019-03-25 DIAGNOSIS — F1020 Alcohol dependence, uncomplicated: Secondary | ICD-10-CM

## 2019-03-25 NOTE — Progress Notes (Signed)
Virtual Visit via Phone Note  I connected with [name] on 03/25/19 at  2:00 PM EDT by a phone enabled telemedicine application and verified that I am speaking with the correct person using two identifiers.   I discussed the limitations of evaluation and management by telemedicine and the availability of in person appointments. The patient expressed understanding and agreed to proceed.  History of Present Illness: Pt is self referred after discharge from [facility] and [place] for schitzoaffective disorder, bipolar type, Alcohol Use Disorder, Severe    Observations/Objective: Pt presents for her telehealth session by phone due to computer issues.     Assessment and Plan: Pt presents admitting to she is in withdrawal from alcohol. She inquired about Antabuse. Educated pt on the medication as well as Vivatrol that helps with cravings. She reports she is going to call her psychiatrist [name] to get prescriptions.  She reported  drinking yesterday, describing the whole process of acquiring it. She reports she is in withdrawal currently. She described her symptoms of withdrawal she was experiencing including shakes and some "seizure activity." Suggested she go to the ED. Asked if she wanted to reschedule her appointment until tomorrow. Pt verbalized her desire to reschedule to tomorrow. Again, suggested she go the the ED.     Questions: Virtual trauma therapy?  Follow Up Instructions:    I discussed the assessment and treatment plan with the patient. The patient was provided an opportunity to ask questions and all were answered. The patient agreed with the plan and demonstrated an understanding of the instructions.   The patient was advised to call back or seek an in-person evaluation if the symptoms worsen or if the condition fails to improve as anticipated.  I provided 25 minutes of non-face-to-face time during this encounter.   Woodford Strege S, LCAS

## 2019-03-26 ENCOUNTER — Other Ambulatory Visit: Payer: Self-pay

## 2019-03-26 ENCOUNTER — Ambulatory Visit (INDEPENDENT_AMBULATORY_CARE_PROVIDER_SITE_OTHER): Payer: BC Managed Care – PPO | Admitting: Licensed Clinical Social Worker

## 2019-03-26 ENCOUNTER — Encounter (HOSPITAL_COMMUNITY): Payer: Self-pay | Admitting: Licensed Clinical Social Worker

## 2019-03-26 DIAGNOSIS — F1020 Alcohol dependence, uncomplicated: Secondary | ICD-10-CM

## 2019-03-26 DIAGNOSIS — F250 Schizoaffective disorder, bipolar type: Secondary | ICD-10-CM

## 2019-03-26 NOTE — Progress Notes (Signed)
Virtual Visit via Phone Note  I connected with Stacy Poole on 03/26/19 at 12:30 PM EDT by a phone enabled telemedicine application and verified that I am speaking with the correct person using two identifiers.   I discussed the limitations of evaluation and management by telemedicine and the availability of in person appointments. The patient expressed understanding and agreed to proceed.  History of Present Illness: Pt is self referred after discharge from [facility] and [place] for schitzoaffective disorder, bipolar type, Alcohol Use Disorder, Severe    Observations/Objective: Pt presents for her telehealth session by phone due to computer issues.     Assessment and Plan: Pt presents agitated for her phone session. Asked about her symptoms of withdrawal that she was experiencing yesterday, she said she was doing better today. She was agitated because she reached out to her psychiatrist asking for medication - Antabuse and has to wait 30 days for an appointment. Discussed alternatives. We had also talked about Vivatrol for cravings. She became agitated and yelled, "Maam, Maam, You work for me." I have decided which medication I want to use" "You have traumatized me so I'm hanging up now."   Questions: Virtual trauma therapy?  Follow Up Instructions:    I discussed the assessment and treatment plan with the patient. The patient was provided an opportunity to ask questions and all were answered. The patient agreed with the plan and demonstrated an understanding of the instructions.   The patient was advised to call back or seek an in-person evaluation if the symptoms worsen or if the condition fails to improve as anticipated.  I provided 9 minutes of non-face-to-face time during this encounter.   MACKENZIE,LISBETH S, LCAS

## 2019-03-27 ENCOUNTER — Other Ambulatory Visit: Payer: Self-pay

## 2019-03-27 ENCOUNTER — Inpatient Hospital Stay (HOSPITAL_COMMUNITY)
Admission: RE | Admit: 2019-03-27 | Discharge: 2019-04-04 | DRG: 885 | Disposition: A | Discharge status: Home / Self Care | Payer: BC Managed Care – PPO | Attending: Psychiatry | Admitting: Psychiatry

## 2019-03-27 ENCOUNTER — Encounter (HOSPITAL_COMMUNITY): Payer: Self-pay | Admitting: *Deleted

## 2019-03-27 DIAGNOSIS — F17210 Nicotine dependence, cigarettes, uncomplicated: Secondary | ICD-10-CM | POA: Diagnosis present

## 2019-03-27 DIAGNOSIS — F419 Anxiety disorder, unspecified: Secondary | ICD-10-CM | POA: Diagnosis present

## 2019-03-27 DIAGNOSIS — F4310 Post-traumatic stress disorder, unspecified: Secondary | ICD-10-CM | POA: Diagnosis present

## 2019-03-27 DIAGNOSIS — G4700 Insomnia, unspecified: Secondary | ICD-10-CM | POA: Diagnosis present

## 2019-03-27 DIAGNOSIS — Z62810 Personal history of physical and sexual abuse in childhood: Secondary | ICD-10-CM | POA: Diagnosis present

## 2019-03-27 DIAGNOSIS — Z79899 Other long term (current) drug therapy: Secondary | ICD-10-CM | POA: Diagnosis not present

## 2019-03-27 DIAGNOSIS — F1020 Alcohol dependence, uncomplicated: Secondary | ICD-10-CM | POA: Diagnosis present

## 2019-03-27 DIAGNOSIS — F250 Schizoaffective disorder, bipolar type: Secondary | ICD-10-CM | POA: Diagnosis present

## 2019-03-27 DIAGNOSIS — Z20828 Contact with and (suspected) exposure to other viral communicable diseases: Secondary | ICD-10-CM | POA: Diagnosis present

## 2019-03-27 DIAGNOSIS — F209 Schizophrenia, unspecified: Secondary | ICD-10-CM | POA: Diagnosis present

## 2019-03-27 DIAGNOSIS — F201 Disorganized schizophrenia: Secondary | ICD-10-CM | POA: Diagnosis not present

## 2019-03-27 MED ORDER — TRAZODONE HCL 50 MG PO TABS
50.0000 mg | ORAL_TABLET | Freq: Every evening | ORAL | Status: DC | PRN
  Administered 2019-03-27 – 2019-03-31 (×4): 50 mg via ORAL
  Filled 2019-03-27 (×3): qty 1

## 2019-03-27 MED ORDER — HYDROXYZINE HCL 25 MG PO TABS
25.0000 mg | ORAL_TABLET | Freq: Three times a day (TID) | ORAL | Status: DC | PRN
  Administered 2019-03-28 – 2019-04-04 (×14): 25 mg via ORAL
  Filled 2019-03-27 (×14): qty 1

## 2019-03-27 MED ORDER — ASENAPINE MALEATE 5 MG SL SUBL
5.0000 mg | SUBLINGUAL_TABLET | Freq: Two times a day (BID) | SUBLINGUAL | Status: DC
  Administered 2019-03-27 – 2019-03-28 (×2): 5 mg via SUBLINGUAL
  Filled 2019-03-27 (×6): qty 1

## 2019-03-27 MED ORDER — ALUM & MAG HYDROXIDE-SIMETH 200-200-20 MG/5ML PO SUSP: 30.0000 mL | ORAL | Status: DC | PRN

## 2019-03-27 MED ORDER — PERPHENAZINE 4 MG PO TABS
4.0000 mg | ORAL_TABLET | Freq: Two times a day (BID) | ORAL | Status: DC | PRN
  Administered 2019-03-27 – 2019-03-28 (×2): 4 mg via ORAL
  Filled 2019-03-27: qty 2
  Filled 2019-03-27: qty 1

## 2019-03-27 MED ORDER — LAMOTRIGINE 25 MG PO TABS
25.0000 mg | ORAL_TABLET | Freq: Two times a day (BID) | ORAL | Status: DC
  Administered 2019-03-27 – 2019-03-28 (×2): 25 mg via ORAL
  Filled 2019-03-27 (×6): qty 1

## 2019-03-27 MED ORDER — LORAZEPAM 1 MG PO TABS
2.0000 mg | ORAL_TABLET | Freq: Once | ORAL | Status: AC
  Administered 2019-03-27: 22:00:00 2 mg via ORAL
  Filled 2019-03-27: qty 2

## 2019-03-27 MED ORDER — ACETAMINOPHEN 325 MG PO TABS
650.0000 mg | ORAL_TABLET | Freq: Four times a day (QID) | ORAL | Status: DC | PRN
  Administered 2019-03-29 – 2019-04-03 (×4): 650 mg via ORAL
  Filled 2019-03-27 (×4): qty 2

## 2019-03-27 MED ORDER — MAGNESIUM HYDROXIDE 400 MG/5ML PO SUSP: 30.0000 mL | Freq: Every day | ORAL | Status: DC | PRN

## 2019-03-27 MED ORDER — FLUOXETINE HCL 20 MG PO CAPS
40.0000 mg | ORAL_CAPSULE | Freq: Every day | ORAL | Status: DC
  Administered 2019-03-28: 40 mg via ORAL
  Filled 2019-03-27 (×3): qty 2
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
## 2019-03-27 NOTE — Tx Team (Signed)
Initial Treatment Plan 03/27/2019 10:39 PM Stacy Poole EYE:[id number]    PATIENT STRESSORS: Financial difficulties Marital or family conflict Other: Psychosis   PATIENT STRENGTHS: Average or above average intelligence General fund of knowledge Motivation for treatment/growth Supportive family/friends   PATIENT IDENTIFIED PROBLEMS: psychosis  anxiety  Depression                  DISCHARGE CRITERIA:  Improved stabilization in mood, thinking, and/or behavior Need for constant or close observation no longer present Verbal commitment to aftercare and medication compliance  PRELIMINARY DISCHARGE PLAN: Outpatient therapy Return to previous living arrangement Return to previous work or school arrangements  PATIENT/FAMILY INVOLVEMENT: This treatment plan has been presented to and reviewed with the patient, Stacy Poole, and/or family member,   The patient and family have been given the opportunity to ask questions and make suggestions.  Clarisa Fling, RN 03/27/2019, 10:39 PM

## 2019-03-27 NOTE — Progress Notes (Signed)
Voluntary admission accompanied with her mother with psychosis. 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 On admission.rapid pressured word salad speech,  Restless, hyperactive and labile. Flight of ideas. Impaired judgement. Endorses SI/HI, Denies pain. Showered multiple times, sandwich and Gatorade consumed. Placed in room 207 with sitter for safety while awaiting pending Covid screen and placement on [address]. Contracts for safety

## 2019-03-27 NOTE — Progress Notes (Signed)
Yelling, pacing, agitated and attempting to lay on the floor in hallway. Difficult to redirect. NP on call made aware, medication ordered and given. Placed in room with sitter at bedside for continued safety

## 2019-03-28 DIAGNOSIS — F250 Schizoaffective disorder, bipolar type: Principal | ICD-10-CM

## 2019-03-28 LAB — COMPREHENSIVE METABOLIC PANEL
ALT: 18 U/L (ref 0–44)
AST: 19 U/L (ref 15–41)
Albumin: 4 g/dL (ref 3.5–5.0)
Alkaline Phosphatase: 41 U/L (ref 38–126)
Anion gap: 12 (ref 5–15)
BUN: 13 mg/dL (ref 6–20)
CO2: 24 mmol/L (ref 22–32)
Calcium: 9.1 mg/dL (ref 8.9–10.3)
Chloride: 99 mmol/L (ref 98–111)
Creatinine, Ser: 0.65 mg/dL (ref 0.44–1.00)
GFR calc Af Amer: >60 mL/min (ref >60)
GFR calc non Af Amer: >60 mL/min (ref >60)
Glucose, Bld: 112 mg/dL — ABNORMAL HIGH (ref 70–99)
Potassium: 3.7 mmol/L (ref 3.5–5.1)
Sodium: 135 mmol/L (ref 135–145)
Total Bilirubin: 0.3 mg/dL (ref 0.3–1.2)
Total Protein: 6.7 g/dL (ref 6.5–8.1)

## 2019-03-28 LAB — TSH: TSH: 0.653 u[IU]/mL (ref 0.350–4.500)

## 2019-03-28 LAB — LIPID PANEL
Cholesterol: 179 mg/dL (ref 0–200)
HDL: 95 mg/dL (ref >40)
LDL Cholesterol: 78 mg/dL (ref 0–99)
Total CHOL/HDL Ratio: 1.9 RATIO
Triglycerides: 32 mg/dL (ref <150)
VLDL: 6 mg/dL (ref 0–40)

## 2019-03-28 LAB — CBC
HCT: 38.8 % (ref 36.0–46.0)
Hemoglobin: 12.2 g/dL (ref 12.0–15.0)
MCH: 28.8 pg (ref 26.0–34.0)
MCHC: 31.4 g/dL (ref 30.0–36.0)
MCV: 91.7 fL (ref 80.0–100.0)
Platelets: 237 10*3/uL (ref 150–400)
RBC: 4.23 MIL/uL (ref 3.87–5.11)
RDW: 14.8 % (ref 11.5–15.5)
WBC: 5.9 10*3/uL (ref 4.0–10.5)
nRBC: 0 % (ref 0.0–0.2)

## 2019-03-28 LAB — HEMOGLOBIN A1C
Hgb A1c MFr Bld: 4.9 % (ref 4.8–5.6)
Mean Plasma Glucose: 93.93 mg/dL

## 2019-03-28 LAB — SARS CORONAVIRUS 2 BY RT PCR (HOSPITAL ORDER, PERFORMED IN ~~LOC~~ HOSPITAL LAB): SARS Coronavirus 2: NEGATIVE

## 2019-03-28 MED ORDER — HALOPERIDOL LACTATE 5 MG/ML IJ SOLN
10.0000 mg | Freq: Four times a day (QID) | INTRAMUSCULAR | Status: DC | PRN
  Administered 2019-04-02: 10 mg via INTRAMUSCULAR
  Filled 2019-03-28: qty 2

## 2019-03-28 MED ORDER — FLUOXETINE HCL 20 MG PO CAPS
20.0000 mg | ORAL_CAPSULE | Freq: Every day | ORAL | Status: DC
  Administered 2019-03-29 – 2019-03-31 (×3): 20 mg via ORAL
  Filled 2019-03-28 (×4): qty 1

## 2019-03-28 MED ORDER — HALOPERIDOL 5 MG PO TABS
5.0000 mg | ORAL_TABLET | Freq: Four times a day (QID) | ORAL | Status: DC | PRN
  Administered 2019-03-28 – 2019-04-04 (×10): 5 mg via ORAL
  Filled 2019-03-28 (×10): qty 1

## 2019-03-28 MED ORDER — TEMAZEPAM 30 MG PO CAPS
45.0000 mg | ORAL_CAPSULE | Freq: Every day | ORAL | Status: DC
  Administered 2019-03-28 – 2019-03-30 (×3): 45 mg via ORAL
  Filled 2019-03-28 (×3): qty 1

## 2019-03-28 MED ORDER — LORAZEPAM 1 MG PO TABS
2.0000 mg | ORAL_TABLET | ORAL | Status: DC | PRN
  Administered 2019-03-28 – 2019-04-03 (×5): 2 mg via ORAL
  Filled 2019-03-28 (×5): qty 2

## 2019-03-28 MED ORDER — LORAZEPAM 2 MG/ML IJ SOLN
2.0000 mg | INTRAMUSCULAR | Status: DC | PRN
  Filled 2019-03-28 (×2): qty 1

## 2019-03-28 MED ORDER — PERPHENAZINE 4 MG PO TABS
4.0000 mg | ORAL_TABLET | Freq: Two times a day (BID) | ORAL | Status: DC
  Administered 2019-03-28 – 2019-03-29 (×2): 4 mg via ORAL
  Filled 2019-03-28 (×4): qty 1

## 2019-03-28 MED ORDER — TEMAZEPAM 30 MG PO CAPS: 30.0000 mg | ORAL_CAPSULE | Freq: Every day | ORAL | Status: DC

## 2019-03-28 MED ORDER — ASENAPINE MALEATE 5 MG SL SUBL
10.0000 mg | SUBLINGUAL_TABLET | Freq: Two times a day (BID) | SUBLINGUAL | Status: DC
  Filled 2019-03-28 (×2): qty 2

## 2019-03-28 MED ORDER — PERPHENAZINE 8 MG PO TABS
8.0000 mg | ORAL_TABLET | Freq: Every day | ORAL | Status: DC
  Administered 2019-03-28 – 2019-03-29 (×2): 8 mg via ORAL
  Filled 2019-03-28 (×3): qty 1

## 2019-03-28 NOTE — [facility] Suicide Risk Assessment (Signed)
[facility] Admission Suicide Risk Assessment   Nursing information obtained from:  Patient, Family Demographic factors:  Unemployed Current Mental Status:  Suicidal ideation indicated by patient, Self-harm thoughts, Self-harm behaviors, Suicidal ideation indicated by others, Thoughts of violence towards others Loss Factors:  Financial problems / change in socioeconomic status Historical Factors:  Impulsivity, Prior suicide attempts, Family history of suicide Risk Reduction Factors:  Living with another person, especially a relative  Total Time spent with patient: 45 min Principal Problem: <principal problem not specified> Diagnosis:  Active Problems:   Schizophrenia ([place])  Subjective Data: Presents with numerous delusional statements  Continued Clinical Symptoms:  Alcohol Use Disorder Identification Test Final Score (AUDIT): 6 The "Alcohol Use Disorders Identification Test", Guidelines for Use in Primary Care, Second Edition.  World Pharmacologist [facility]). Score between 0-7:  no or low risk or alcohol related problems. Score between 8-15:  moderate risk of alcohol related problems. Score between 16-19:  high risk of alcohol related problems. Score 20 or above:  warrants further diagnostic evaluation for alcohol dependence and treatment.   CLINICAL FACTORS:   Previous Psychiatric Diagnoses and Treatments   Musculoskeletal: Strength & Muscle Tone: within normal limits Gait & Station: normal Patient leans: N/A  Psychiatric Specialty Exam: Physical Exam  Nursing note and vitals reviewed. Constitutional: She appears well-developed and well-nourished.  HENT:  Head: Normocephalic and atraumatic.    Review of Systems  Constitutional: Negative.   Eyes: Negative.   Respiratory: Negative.   Cardiovascular: Negative.   Gastrointestinal: Negative.   Genitourinary: Negative.   Musculoskeletal: Negative.   Neurological: Negative.   Endo/Heme/Allergies: Negative.     Blood pressure  (!) 119/97, pulse 97, temperature 98.5 F (36.9 C), temperature source Oral, resp. rate 20.There is no height or weight on file to calculate BMI.  General Appearance: Casual  Eye Contact:  Fair  Speech:  Pressured  Volume:  Normal  Mood:  Irritable and Hypomanic  Affect:  Congruent and Labile  Thought Process:  Disorganized, Irrelevant and Descriptions of Associations: Loose  Orientation:  Full (Time, Place, and Person)  Thought Content:  Illogical and Delusions  Suicidal Thoughts:  No  Homicidal Thoughts:  No  Memory:  Immediate;   Poor  Judgement:  Impaired  Insight:  Lacking  Psychomotor Activity:  Normal  Concentration:  Concentration: Poor and Attention Span: Poor  Recall:  [name] of Knowledge:  Fair  Language:  Fair  Akathisia:  Negative  Handed:  Right  AIMS (if indicated):     Assets:  Resilience Social Support  ADL's:  Intact  Cognition:  WNL  Sleep:  Number of Hours: 0      COGNITIVE FEATURES THAT CONTRIBUTE TO RISK:  Closed-mindedness and Loss of executive function    SUICIDE RISK:   Minimal: No identifiable suicidal ideation.  Patients presenting with no risk factors but with morbid ruminations; may be classified as minimal risk based on the severity of the depressive symptoms  PLAN OF CARE: Escalate perphenazine see orders  I certify that inpatient services furnished can reasonably be expected to improve the patient's condition.   Johnn Hai, MD 03/28/2019, 10:43 AM

## 2019-03-28 NOTE — Tx Team (Signed)
Interdisciplinary Treatment and Diagnostic Plan Update  03/28/2019 Time of Session: 09:04am Stacy Poole MRN: [id number]  Principal Diagnosis: <principal problem not specified>  Secondary Diagnoses: Active Problems:   Schizophrenia ([place])   Current Medications:  Current Facility-Administered Medications  Medication Dose Route Frequency Provider Last Rate Last Dose  . acetaminophen (TYLENOL) tablet 650 mg  650 mg Oral Q6H PRN Rozetta Nunnery, NP      . alum & mag hydroxide-simeth (MAALOX/MYLANTA) 200-200-20 MG/5ML suspension 30 mL  30 mL Oral Q4H PRN Lindon Romp A, NP      . asenapine (SAPHRIS) sublingual tablet 10 mg  10 mg Sublingual BID Johnn Hai, MD      . Derrill Memo ON 03/29/2019] FLUoxetine (PROZAC) capsule 20 mg  20 mg Oral Daily Johnn Hai, MD      . hydrOXYzine (ATARAX/VISTARIL) tablet 25 mg  25 mg Oral TID PRN Rozetta Nunnery, NP   25 mg at 03/28/19 8413  . magnesium hydroxide (MILK OF MAGNESIA) suspension 30 mL  30 mL Oral Daily PRN Lindon Romp A, NP      . perphenazine (TRILAFON) tablet 4 mg  4 mg Oral BID Johnn Hai, MD      . temazepam (RESTORIL) capsule 30 mg  30 mg Oral QHS Johnn Hai, MD      . traZODone (DESYREL) tablet 50 mg  50 mg Oral QHS PRN Rozetta Nunnery, NP   50 mg at 03/27/19 2208   PTA Medications: Medications Prior to Admission  Medication Sig Dispense Refill Last Dose  . FLUoxetine (PROZAC) 40 MG capsule Take 40 mg by mouth daily.     Marland Kitchen lamoTRIgine (LAMICTAL) 25 MG tablet Take 25 mg by mouth 2 (two) times daily.     . Asenapine Maleate (SAPHRIS) 10 MG SUBL Place 1 tablet (10 mg total) under the tongue 2 (two) times daily. (Patient not taking: Reported on 02/21/2019) 60 tablet 0   . hydrOXYzine (ATARAX/VISTARIL) 25 MG tablet Take 25 mg by mouth every 6 (six) hours as needed for anxiety.     Marland Kitchen perphenazine (TRILAFON) 4 MG tablet Take 2 mg by mouth 2 (two) times daily as needed (mood and psychosis).     . SAPHRIS 5 MG SUBL 24 hr tablet TAKE 1 TABLET BY  MOUTH TWICE DAILY (Patient not taking: Reported on 02/21/2019) 60 tablet 0   . traZODone (DESYREL) 50 MG tablet TAKE 1 TABLET BY MOUTH AT BEDTIME AS NEEDED FOR SLEEP (Patient taking differently: Take 50 mg by mouth at bedtime as needed for sleep. ) 30 tablet 0     Patient Stressors: Financial difficulties Marital or family conflict Other: Psychosis  Patient Strengths: Average or above average intelligence General fund of knowledge Motivation for treatment/growth Supportive family/friends  Treatment Modalities: Medication Management, Group therapy, Case management,  1 to 1 session with clinician, Psychoeducation, Recreational therapy.   Physician Treatment Plan for Primary Diagnosis: <principal problem not specified> Long Term Goal(s):     Short Term Goals:    Medication Management: Evaluate patient's response, side effects, and tolerance of medication regimen.  Therapeutic Interventions: 1 to 1 sessions, Unit Group sessions and Medication administration.  Evaluation of Outcomes: Not Met  Physician Treatment Plan for Secondary Diagnosis: Active Problems:   Schizophrenia ([place])  Long Term Goal(s):     Short Term Goals:       Medication Management: Evaluate patient's response, side effects, and tolerance of medication regimen.  Therapeutic Interventions: 1 to 1 sessions, Unit Group sessions and  Medication administration.  Evaluation of Outcomes: Not Met   RN Treatment Plan for Primary Diagnosis: <principal problem not specified> Long Term Goal(s): Knowledge of disease and therapeutic regimen to maintain health will improve  Short Term Goals: Ability to participate in decision making will improve, Ability to verbalize feelings will improve, Ability to disclose and discuss suicidal ideas, Ability to identify and develop effective coping behaviors will improve and Compliance with prescribed medications will improve  Medication Management: RN will administer medications as  ordered by provider, will assess and evaluate patient's response and provide education to patient for prescribed medication. RN will report any adverse and/or side effects to prescribing provider.  Therapeutic Interventions: 1 on 1 counseling sessions, Psychoeducation, Medication administration, Evaluate responses to treatment, Monitor vital signs and CBGs as ordered, Perform/monitor CIWA, COWS, AIMS and Fall Risk screenings as ordered, Perform wound care treatments as ordered.  Evaluation of Outcomes: Not Met   LCSW Treatment Plan for Primary Diagnosis: <principal problem not specified> Long Term Goal(s): Safe transition to appropriate next level of care at discharge, Engage patient in therapeutic group addressing interpersonal concerns.  Short Term Goals: Engage patient in aftercare planning with referrals and resources and Increase skills for wellness and recovery  Therapeutic Interventions: Assess for all discharge needs, 1 to 1 time with Social worker, Explore available resources and support systems, Assess for adequacy in community support network, Educate family and significant other(s) on suicide prevention, Complete Psychosocial Assessment, Interpersonal group therapy.  Evaluation of Outcomes: Not Met   Progress in Treatment: Attending groups: No. Participating in groups: No. Taking medication as prescribed: Yes. Toleration medication: Yes. Family/Significant other contact made: No, will contact:  will contact if given consent to contact Patient understands diagnosis: No. Discussing patient identified problems/goals with staff: No. Medical problems stabilized or resolved: No. Denies suicidal/homicidal ideation: No. Issues/concerns per patient self-inventory: Yes. Other:   New problem(s) identified: No, Describe:  None  New Short Term/Long Term Goal(s): Medication stabilization, elimination of SI thoughts, and development of a comprehensive mental wellness plan.   Patient  Goals:    Discharge Plan or Barriers: CSW will continue to follow up for appropriate referrals and possible discharge planning  Reason for Continuation of Hospitalization: Delusions  [place] Medication stabilization  Estimated Length of Stay: 3-5 days  Attendees: Patient: 03/28/2019   Physician: Dr. Johnn Hai, MD 03/28/2019  Nursing: Estill Bamberg, RN 03/28/2019   RN Care Manager: 03/28/2019   Social Worker: Ardelle Anton, LCSW 03/28/2019   Recreational Therapist:  03/28/2019   Other:  03/28/2019   Other:  03/28/2019   Other: 03/28/2019      Scribe for Treatment Team: Trecia Rogers, LCSW 03/28/2019 10:03 AM

## 2019-03-28 NOTE — Progress Notes (Signed)
"  I need my meds at 6 o"clock since I work for [employer]". The patient came out of her room 10 times over the past hour.
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
## 2019-03-28 NOTE — Progress Notes (Signed)
Pt constantly coming out her room coming to the nursing station and talking to staff. 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 When she is told no, pt begins to cry and exhibit axis II behaviors. 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

## 2019-03-28 NOTE — Progress Notes (Signed)
Patient ID: Stacy Poole, female   DOB: [birth year], 28 y.o.   MRN: [id number]  Patient became suddenly agitated, approaching the nurses's station stating she was going to kill a peer who was a "Soviet Spy" and making other delusional statements. Patient yells, "I am here because of Stacy Poole, I am on a secret mission!" Patient provided emotional support and redirected to her room. Patient returned from her room wearing additional scrub clothing around her head like a hood and states "I don't feel safe!" Patient shouting, "you're all pedophiles!" Patient demanding staff retrieve clothing from her locker but is informed staff cannot do that at this time. MD notified of patient's behaviors. New orders received. Patient agreeable to taking scheduled and PRN medications PO. Patient remains safe on the unit at this time. Will continue to support and monitor.

## 2019-03-28 NOTE — Progress Notes (Signed)
Patient ID: Stacy Poole, female   DOB: April 19, 1991, 28 y.o.   MRN: [id number]  Nursing Progress Note 7a-7p  Patient observed demonstrating bizarre behaviors including wearing scrub pants as a head wrap and underwear in her hair. Patient states she is the "covid fairy". Patient complaint with medications and frequently requests Vistaril. Patient intrusive at times and delusional. Patient paranoid stating, "why does this hall go last for lunch? Is it because we are all crazy? You're keeping [place] away from everyone". Patient redirectable at times but is also easily triggered by her peers. Q15 minute safety checks remain in place. Will continue to support and monitor with plan of care.

## 2019-03-28 NOTE — H&P (Signed)
Behavioral Health Medical Screening Exam  Stacy Poole is an 28 y.o. female.  Total Time spent with patient: 30 minutes  Psychiatric Specialty Exam: Physical Exam  Constitutional: She is oriented to person, place, and time. She appears well-developed and well-nourished. No distress.  HENT:  Head: Normocephalic and atraumatic.  Right Ear: External ear normal.  Left Ear: External ear normal.  Eyes: Pupils are equal, round, and reactive to light. Right eye exhibits no discharge. Left eye exhibits no discharge.  Respiratory: Effort normal. No respiratory distress.  Musculoskeletal: Normal range of motion.  Neurological: She is alert and oriented to person, place, and time.  Skin: She is not diaphoretic.    Review of Systems  Constitutional: Negative for chills, diaphoresis, fever, malaise/fatigue and weight loss.  Respiratory: Negative for cough and shortness of breath.   Cardiovascular: Negative for chest pain.  Gastrointestinal: Negative for diarrhea, nausea and vomiting.  Psychiatric/Behavioral: Positive for depression, hallucinations and suicidal ideas. Negative for memory loss and substance abuse. The patient is nervous/anxious and has insomnia.     Blood pressure (!) 148/113, pulse 100, temperature 98.5 F (36.9 C), temperature source Oral, resp. rate 20.There is no height or weight on file to calculate BMI.  General Appearance: Casual and Fairly Groomed  Eye Contact:  Fair  Speech:  Pressured  Volume:  Increased  Mood:  Anxious and Depressed  Affect:  Inappropriate  Thought Process:  Disorganized and Descriptions of Associations: Tangential  Orientation:  Full (Time, Place, and Person)  Thought Content:  Delusions and Hallucinations: Auditory  Suicidal Thoughts:  Yes.  without intent/plan  Homicidal Thoughts:  No  Memory:  UTA  Judgement:  Impaired  Insight:  Lacking  Psychomotor Activity:  Increased  Concentration: Concentration: Poor and Attention Span: Poor   Recall:  Poor  Fund of Knowledge:Fair  Language: Fair  Akathisia:  Negative  Handed:  Right  AIMS (if indicated):     Assets:  Desire for Improvement Financial Resources/Insurance Housing Leisure Time Physical Health Resilience Transportation  Sleep:       Musculoskeletal: Strength & Muscle Tone: within normal limits Gait & Station: normal Patient leans: N/A  Blood pressure (!) 148/113, pulse 100, temperature 98.5 F (36.9 C), temperature source Oral, resp. rate 20.  Recommendations:  Based on my evaluation the patient does not appear to have an emergency medical condition.  Rozetta Nunnery, NP 03/28/2019, 1:54 AM
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
## 2019-03-28 NOTE — Progress Notes (Signed)
Recreation Therapy Notes  Date: 03/28/2019 Time: 10:00 am Location: 500 hall   Group Topic: Strengths Exploration  Goal Area(s) Addresses:  Patient will work on worksheet on Strengths Exploration. Patient will follow directions on first prompt.  Behavioral Response: Appropriate  Intervention: Worksheet  Activity:  Staff on 500 hall were provided with a worksheet on Strengths Exploration. Staff was instructed to give it to the patients and have them work on it in place of Recreation Therapy Group. Staff was also given 2 coloring sheets and 2 word searches and were given the option to give them out.  Education:  Ability to follow Directions, Change of thought processes Discharge Planning, Goal Planning.   Education Outcome: Acknowledges education/In group clarification offered  Clinical Observations/Feedback: . Due to COVID-19, guidelines group was not held. Group members were provided a learning activity worksheet to work on the topic and above-stated goals. LRT is available to answer any questions patient may have regarding the worksheet.  Stacy Poole, Stacy Poole         Stacy Poole 03/28/2019 12:04 PM 
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
## 2019-03-28 NOTE — Plan of Care (Signed)
  Problem: Health Behavior/Discharge Planning: Goal: Compliance with prescribed medication regimen will improve Outcome: Progressing   Problem: Safety: Goal: Periods of time without injury will increase Outcome: Progressing

## 2019-03-29 DIAGNOSIS — F201 Disorganized schizophrenia: Secondary | ICD-10-CM

## 2019-03-29 MED ORDER — NICOTINE POLACRILEX 2 MG MT GUM
2.0000 mg | CHEWING_GUM | OROMUCOSAL | Status: DC | PRN
  Administered 2019-03-29 – 2019-04-04 (×19): 2 mg via ORAL
  Filled 2019-03-29 (×9): qty 1

## 2019-03-29 MED ORDER — PERPHENAZINE 8 MG PO TABS
8.0000 mg | ORAL_TABLET | Freq: Two times a day (BID) | ORAL | Status: DC
  Administered 2019-03-29 – 2019-04-04 (×12): 8 mg via ORAL
  Filled 2019-03-29 (×7): qty 1
  Filled 2019-03-29: qty 2
  Filled 2019-03-29 (×8): qty 1

## 2019-03-29 NOTE — Progress Notes (Addendum)
D. Pt presents with rapid, pressured speech- occasional word salad. Pt remains bizarre, delusional and paranoid, frequently refers to another patient as "a pedophile" and states, "I'm an [place]".  Per pt's self inventory, pt rates her depression, hopelessness and anxiety all 0's. Pt writes that her goal today is "getting my mind straight".Pt currently denies SI/HI and AVH  A. Labs and vitals monitored. Pt compliant with medications. Pt supported emotionally and encouraged to express concerns and ask questions.   R. Pt remains safe with 15 minute checks. Will continue POC.
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
## 2019-03-29 NOTE — [facility] Group Notes (Signed)
  [facility] LCSW Group Therapy Note  Date/Time: 03/29/2019 @ 10am  Type of Therapy/Topic:  Group Therapy:  Emotion Regulation  Participation Level:  None   Mood: Irritable  Description of Group:    The purpose of this group is to assist patients in learning to regulate negative emotions and experience positive emotions. Patients will be guided to discuss ways in which they have been vulnerable to their negative emotions. These vulnerabilities will be juxtaposed with experiences of positive emotions or situations, and patients challenged to use positive emotions to combat negative ones. Special emphasis will be placed on coping with negative emotions in conflict situations, and patients will process healthy conflict resolution skills.  Therapeutic Goals: 1. Patient will identify two positive emotions or experiences to reflect on in order to balance out negative emotions:  2. Patient will label two or more emotions that they find the most difficult to experience:  3. Patient will be able to demonstrate positive conflict resolution skills through discussion or role plays:   Summary of Patient Progress:   Patient was in the the beginning of the group and stated, "I am leaving because there is a pedophile in this room". Patient pointed at another patient and yelled, "she is a pedophile and it is against my human rights to be in here". Patient walked to the door and looked at the other patient and stated, "You're a disgusting, bitch and a pedophile" and left the group.     Therapeutic Modalities:   Cognitive Behavioral Therapy Feelings Identification Dialectical Behavioral Therapy   Ardelle Anton, LCSW

## 2019-03-29 NOTE — Progress Notes (Signed)
[facility] NOVEL CORONAVIRUS (COVID-19) DAILY CHECK-OFF SYMPTOMS - answer yes or no to each - every day NO YES  Have you had a fever in the past 24 hours?  . Fever (Temp > 37.80C / 100F) X   Have you had any of these symptoms in the past 24 hours? . New Cough .  Sore Throat  .  Shortness of Breath .  Difficulty Breathing .  Unexplained Body Aches   X   Have you had any one of these symptoms in the past 24 hours not related to allergies?   . Runny Nose .  Nasal Congestion .  Sneezing   X   If you have had runny nose, nasal congestion, sneezing in the past 24 hours, has it worsened?  X   EXPOSURES - check yes or no X   Have you traveled outside the state in the past 14 days?  X   Have you been in contact with someone with a confirmed diagnosis of COVID-19 or PUI in the past 14 days without wearing appropriate PPE?  X   Have you been living in the same home as a person with confirmed diagnosis of COVID-19 or a PUI (household contact)?    X   Have you been diagnosed with COVID-19?    X              What to do next: Answered NO to all: Answered YES to anything:   Proceed with unit schedule Follow the [facility] Inpatient Flowsheet.   

## 2019-03-29 NOTE — Progress Notes (Signed)
Patient has been up in the dayroom or either the hallway.She had to be redirected on several occasions. She pretended that she was interpreting for [facility]. At times would become argumentative. She was compliant with her medications. Safety maintained on unit with 15 min checks

## 2019-03-30 MED ORDER — PERPHENAZINE 16 MG PO TABS
16.0000 mg | ORAL_TABLET | Freq: Every day | ORAL | Status: DC
  Administered 2019-03-30 – 2019-04-03 (×5): 16 mg via ORAL
  Filled 2019-03-30 (×6): qty 1

## 2019-03-30 NOTE — Progress Notes (Signed)
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 She has multiple layers of clothing on and fashions head coverings out of miscellaneous clothes. Her speech is pressured, rapid, and tangential. She is grandiose and manic in presentation. She is improved today, over yesterday. Patient denies SI/HI/AVH.  A: Patient checked q15 min, and checks reviewed. Reviewed medication changes with patient and educated on side effects. Educated patient on importance of attending group therapy sessions and educated on several coping skills. Encouarged participation in milieu through recreation therapy and attending meals with peers. Support and encouragement provided. Fluids offered. R: Patient receptive to education on medications, and is medication compliant. Patient contracts for safety on the unit.

## 2019-03-30 NOTE — Progress Notes (Signed)
   03/30/19 0751  COVID-19 Daily Checkoff  Have you had a fever (temp > 37.80C/100F)  in the past 24 hours?  No  If you have had runny nose, nasal congestion, sneezing in the past 24 hours, has it worsened? No  COVID-19 EXPOSURE  Have you traveled outside the state in the past 14 days? No  Have you been in contact with someone with a confirmed diagnosis of COVID-19 or PUI in the past 14 days without wearing appropriate PPE? No  Have you been living in the same home as a person with confirmed diagnosis of COVID-19 or a PUI (household contact)? No  Have you been diagnosed with COVID-19? No
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
## 2019-03-30 NOTE — Progress Notes (Signed)
Patient has been observed up and in the dayroom. She has been dressed very bizarre and tangential in conversation. 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 She then starts to speak in [place]. She is concerned about her Lamictal not being ordered. Writer encouraged her to ask her doctor and it may be that this medication does not work well for her. Safety maintained on unit with 15 min checks.

## 2019-03-30 NOTE — Progress Notes (Signed)
Patient is up and in the dayroom or her room back and forth. She comes to nursing station often to request different things which she receives. She is still delusional in her thought process and is hopeful to discharge on tomorrow. She does seem a little more calmer tonight. Safety maintained on unit with 15 min checks.

## 2019-03-31 MED ORDER — TEMAZEPAM 30 MG PO CAPS: 60.0000 mg | ORAL_CAPSULE | Freq: Every day | ORAL | Status: DC

## 2019-03-31 MED ORDER — LAMOTRIGINE 25 MG PO TABS
25.0000 mg | ORAL_TABLET | Freq: Two times a day (BID) | ORAL | Status: DC
  Administered 2019-03-31 – 2019-04-03 (×6): 25 mg via ORAL
  Filled 2019-03-31 (×8): qty 1

## 2019-03-31 MED ORDER — TEMAZEPAM 15 MG PO CAPS
45.0000 mg | ORAL_CAPSULE | Freq: Every day | ORAL | Status: DC
  Administered 2019-03-31 – 2019-04-02 (×2): 45 mg via ORAL
  Filled 2019-03-31 (×2): qty 3

## 2019-03-31 MED ORDER — BENZTROPINE MESYLATE 1 MG PO TABS
1.0000 mg | ORAL_TABLET | Freq: Two times a day (BID) | ORAL | Status: DC
  Administered 2019-03-31 – 2019-04-04 (×8): 1 mg via ORAL
  Filled 2019-03-31 (×10): qty 1

## 2019-03-31 NOTE — Progress Notes (Signed)
Patient ID: Stacy Poole, female   DOB: 08-23-1991, 28 y.o.   MRN: [id number]  Nursing Progress Note 7a-7p   Patient remains with labile mood and delusions. 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 Patient intrusive with staff/peers and requires redirection. Patient easily influenced by behaviors of peers. Patient is fixated on discharging today. q15 minute safety checks in place. Patient remains safe on the unit. Will continue to support and monitor with plan of care.  Patient's self-inventory sheet Rated Energy Level  Normal  Rated Sleep  Good  Rated Appetite  Good  Rated Anxiety (0-10)  0  Rated Hopelessness (0-10)  0  Rated Depression (0-10)  0  Daily Goal  "getting released from here"  Any Additional Comments:  N/A
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
## 2019-03-31 NOTE — Progress Notes (Signed)
[place] NOVEL CORONAVIRUS (COVID-19) DAILY CHECK-OFF SYMPTOMS - answer yes or no to each - every day NO YES  Have you had a fever in the past 24 hours?  . Fever (Temp > 37.80C / 100F) X   Have you had any of these symptoms in the past 24 hours? . New Cough .  Sore Throat  .  Shortness of Breath .  Difficulty Breathing .  Unexplained Body Aches   X   Have you had any one of these symptoms in the past 24 hours not related to allergies?   . Runny Nose .  Nasal Congestion .  Sneezing   X   If you have had runny nose, nasal congestion, sneezing in the past 24 hours, has it worsened?  X   EXPOSURES - check yes or no X   Have you traveled outside the state in the past 14 days?  X   Have you been in contact with someone with a confirmed diagnosis of COVID-19 or PUI in the past 14 days without wearing appropriate PPE?  X   Have you been living in the same home as a person with confirmed diagnosis of COVID-19 or a PUI (household contact)?    X   Have you been diagnosed with COVID-19?    X              What to do next: Answered NO to all: Answered YES to anything:   Proceed with unit schedule Follow the [facility] Inpatient Flowsheet.   
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
## 2019-04-01 MED ORDER — CLONAZEPAM 1 MG PO TABS
ORAL_TABLET | ORAL | Status: AC
  Administered 2019-04-01: 14:00:00 2 mg via ORAL
  Filled 2019-04-01: qty 2

## 2019-04-01 MED ORDER — CLONAZEPAM 1 MG PO TABS
2.0000 mg | ORAL_TABLET | Freq: Once | ORAL | Status: AC
  Administered 2019-04-01: 14:00:00 2 mg via ORAL

## 2019-04-01 NOTE — Progress Notes (Signed)
Recreation Therapy Notes  Date: 7.28.20 Time: 1000 Location: 500 Hall Dayroom  Group Topic: Triggers  Goal Area(s) Addresses:  Patient will identify three biggest triggers.   Patient will identify how to avoid triggers. Patient will identify how to face triggers head on.  Behavioral Response: Anxious  Intervention: Worksheet  Activity: Triggers.  Patients were to identify what triggers certain behaviors in them.  Patients would then identify how they can avoid dealing with their triggers.  Patient would also explain how they could face triggers head on when they can't be avoided.  Education:Triggers, Discharge Planning   Education Outcome: Acknowledges education/In group clarification offered/Needs additional education.   Clinical Observations/Feedback: Pt needed constant redirection from talking over people while others were speaking.  Pt came in and out of group numerous times and was unable to focus on the activity.   Victorino Sparrow, LRT/CTRS        Ria Comment, Dyke Weible A 04/01/2019 11:13 AM

## 2019-04-01 NOTE — Progress Notes (Signed)
Patient did not attend wrap up group. 

## 2019-04-01 NOTE — Progress Notes (Signed)
Patient ID: Stacy Poole, female   DOB: 1990-09-06, 28 y.o.   MRN: [id number]  Nursing Progress Note [id number]  Patient presents with flat affect. Patient with tangential speech, flight of ideas and disorganized thinking. Patient states she works for the [employer] and reports she is [place]. Patient covers her hair with mesh underwear. This morning, patient was reading from the Bible. Patient is intrusive with peers and constantly at the nurses's station. Patient compliant with scheduled medications and frequently requests Haldol. Patient currently denies SI/HI/AVH. Patient remains focused on discharge "so my mom doesn't know what I bought from [place]".  Patient safety maintained with q15 min safety checks.   Patient remains safe on the unit at this time. Will continue to support and monitor with plan of care.   Patient's self-inventory sheet Rated Energy Level  Normal  Rated Sleep  Good  Rated Appetite  Good  Rated Anxiety (0-10)  0  Rated Hopelessness (0-10)  0  Rated Depression (0-10)  0  Daily Goal  "my release"  Any Additional Comments:

## 2019-04-01 NOTE — Progress Notes (Signed)
Patient ID: Stacy Poole, female   DOB: 08-08-1991, 28 y.o.   MRN: [id number]   CSW contacted pt's mother due to mother's concerns around patient's paranoia and delusions. Patient's mother states that the patient is calling her telling her that other patients are "spys and pedophiles". Patient's mother is concerned about her discharging today per what the patient has told her. Mother does not want her released prematurely. Patient's mother states that she just does not want her to be released and then end right back in the hospital.

## 2019-04-01 NOTE — Progress Notes (Signed)
Pt refused to get up for medications.  [place] NOVEL CORONAVIRUS (COVID-19) DAILY CHECK-OFF SYMPTOMS - answer yes or no to each - every day NO YES  Have you had a fever in the past 24 hours?  . Fever (Temp > 37.80C / 100F) X   Have you had any of these symptoms in the past 24 hours? . New Cough .  Sore Throat  .  Shortness of Breath .  Difficulty Breathing .  Unexplained Body Aches   X   Have you had any one of these symptoms in the past 24 hours not related to allergies?   . Runny Nose .  Nasal Congestion .  Sneezing   X   If you have had runny nose, nasal congestion, sneezing in the past 24 hours, has it worsened?  X   EXPOSURES - check yes or no X   Have you traveled outside the state in the past 14 days?  X   Have you been in contact with someone with a confirmed diagnosis of COVID-19 or PUI in the past 14 days without wearing appropriate PPE?  X   Have you been living in the same home as a person with confirmed diagnosis of COVID-19 or a PUI (household contact)?    X   Have you been diagnosed with COVID-19?    X              What to do next: Answered NO to all: Answered YES to anything:   Proceed with unit schedule Follow the [facility] Inpatient Flowsheet.
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
## 2019-04-01 NOTE — Progress Notes (Addendum)
Patient ID: Stacy Poole, female   DOB: 20-Jul-1991, 28 y.o.   MRN: [id number]  Patient became unconsable after pharmacy group stating she feels "very triggered" but cannot articulate what triggered her. Patient observed yelling and crying on the phone. Patient then began pacing the unit, wailing and crying. 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 MD provided new orders and patient was medicated as prescribed. Emotional support provided. Patient is calmer now and observed walking around in the milieu.

## 2019-04-02 MED ORDER — CLONAZEPAM 0.5 MG PO TABS
2.5000 mg | ORAL_TABLET | Freq: Once | ORAL | Status: AC
  Administered 2019-04-02: 2.5 mg via ORAL
  Filled 2019-04-02: qty 5

## 2019-04-02 MED ORDER — LORAZEPAM 2 MG/ML IJ SOLN
4.0000 mg | Freq: Once | INTRAMUSCULAR | Status: AC
  Administered 2019-04-02: 4 mg via INTRAMUSCULAR

## 2019-04-02 MED ORDER — DIPHENHYDRAMINE HCL 50 MG/ML IJ SOLN
INTRAMUSCULAR | Status: AC
  Filled 2019-04-02: qty 1

## 2019-04-02 MED ORDER — LITHIUM CARBONATE 300 MG PO CAPS
300.0000 mg | ORAL_CAPSULE | Freq: Two times a day (BID) | ORAL | Status: DC
  Administered 2019-04-02 – 2019-04-03 (×2): 300 mg via ORAL
  Filled 2019-04-02 (×4): qty 1

## 2019-04-02 MED ORDER — DIPHENHYDRAMINE HCL 50 MG/ML IJ SOLN
50.0000 mg | Freq: Four times a day (QID) | INTRAMUSCULAR | Status: DC | PRN
  Administered 2019-04-02: 09:00:00 50 mg via INTRAMUSCULAR

## 2019-04-02 MED ORDER — DIPHENHYDRAMINE HCL 25 MG PO CAPS
50.0000 mg | ORAL_CAPSULE | ORAL | Status: DC | PRN
  Administered 2019-04-02: 50 mg via ORAL
  Filled 2019-04-02: qty 2

## 2019-04-02 NOTE — BH Assessment (Signed)
STARR initiated for this patient. Patient agitated, in the dayroom threatening staff and patients stating '' Fuck ya'll mother fuckers , I'm gonna kill all ya'll '' pt agitated, pacing, she then went to the phone area started crying and screaming and then banging the phone. She was redirected but continue to escalate and began threatening and disruptive to milieu. Emergent medications required to maintain patient safety as patient escalating and destructive to property. Pt placed in manual hold placed at 0932 to 0934 to safely administer medications. Please refer to [facility] . Pt received IM medications , tolerated well with no issues noted. Patient remained on 1.1. post event per protocol, she went into her room and continued to cry. VS obtained and WDL. MD aware of above. Family notified of event per nursing staff. Pt tolerated well, and able to go back into the dayroom shortly after. No injury or issues noted. Pt remains in the care of primary RN.

## 2019-04-02 NOTE — Progress Notes (Signed)
D: Pt denies SI/HI/AVH. Pt is pleasant and cooperative. Pt paranoid on the unit , but kept to herself this evening.  A: Pt was offered support and encouragement. Pt was given scheduled medications. Pt was encourage to attend groups. Q 15 minute checks were done for safety.   R:Pt attends groups and interacts well with peers and staff. Pt is taking medication. Pt has no complaints.Pt receptive to treatment and safety maintained on unit.
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
## 2019-04-02 NOTE — Progress Notes (Signed)
Recreation Therapy Notes  Date: 7.29.20 Time: 1000 Location: 500 Hall Day Room  Group Topic: Communication, Team Building, Problem Solving  Goal Area(s) Addresses:  Patient will effectively work with peer towards shared goal.  Patient will identify skill used to make activity successful.  Patient will identify how skills used during activity can be used to reach post d/c goals.   Behavioral Response: Appropriate  Intervention: STEM Activity   Activity: Straw Bridge.  In groups, patients were given 15 plastic straws and about 2 feet of masking tape.  Patients were to use the supplies provided to them to construct an elevated bridge that could hold a small puzzle box.  Education: Education officer, community, Dentist.   Education Outcome: Acknowledges education  Clinical Observations/Feedback:  Pt was able to focus on activity and not be disruptive.  Pt didn't construct bridge as instructed but made her own creation.        Victorino Sparrow, LRT/CTRS    Ria Comment, Owain Eckerman A 04/02/2019 10:56 AM

## 2019-04-02 NOTE — Progress Notes (Signed)
Patient ID: Stacy Poole, female   DOB: March 11, 1991, 28 y.o.   MRN: [id number]   CSW was contacted by pt's mother regarding the incident this morning with the patient becoming aggressive and needing medications. Pt's mother stated that she received a phone call from the pt's nurse but just wanted to make sure that the patient is aware of why the medications needed to happen. Pt's mother discussed her concerns about the medications not working in her system and discussed the patient's desire to get out of the hospital. Pt's mother asked that someone sit down with the patient and give her time to talk because the patient feels that nobody cares.

## 2019-04-02 NOTE — Tx Team (Signed)
Interdisciplinary Treatment and Diagnostic Plan Update  04/02/2019 Time of Session: 09:04am Stacy Poole MRN: [id number]  Principal Diagnosis: <principal problem not specified>  Secondary Diagnoses: Active Problems:   Schizophrenia ([place])   Current Medications:  Current Facility-Administered Medications  Medication Dose Route Frequency Provider Last Rate Last Dose  . acetaminophen (TYLENOL) tablet 650 mg  650 mg Oral Q6H PRN Lindon Romp A, NP   650 mg at 04/01/19 1119  . alum & mag hydroxide-simeth (MAALOX/MYLANTA) 200-200-20 MG/5ML suspension 30 mL  30 mL Oral Q4H PRN Lindon Romp A, NP      . benztropine (COGENTIN) tablet 1 mg  1 mg Oral BID Johnn Hai, MD   1 mg at 04/02/19 0829  . clonazePAM (KLONOPIN) tablet 2.5 mg  2.5 mg Oral Once Johnn Hai, MD      . diphenhydrAMINE (BENADRYL) capsule 50 mg  50 mg Oral Q4H PRN Johnn Hai, MD       Or  . diphenhydrAMINE (BENADRYL) injection 50 mg  50 mg Intramuscular Q6H PRN Johnn Hai, MD   50 mg at 04/02/19 2130  . haloperidol (HALDOL) tablet 5 mg  5 mg Oral Q6H PRN Johnn Hai, MD   5 mg at 04/02/19 0445   Or  . haloperidol lactate (HALDOL) injection 10 mg  10 mg Intramuscular Q6H PRN Johnn Hai, MD   10 mg at 04/02/19 8657  . hydrOXYzine (ATARAX/VISTARIL) tablet 25 mg  25 mg Oral TID PRN Rozetta Nunnery, NP   25 mg at 04/02/19 0445  . lamoTRIgine (LAMICTAL) tablet 25 mg  25 mg Oral BID Johnn Hai, MD   25 mg at 04/02/19 0829  . lithium carbonate capsule 300 mg  300 mg Oral BID WC Johnn Hai, MD      . LORazepam (ATIVAN) tablet 2 mg  2 mg Oral Q4H PRN Johnn Hai, MD   2 mg at 03/30/19 0549   Or  . LORazepam (ATIVAN) injection 2 mg  2 mg Intramuscular Q4H PRN Johnn Hai, MD      . magnesium hydroxide (MILK OF MAGNESIA) suspension 30 mL  30 mL Oral Daily PRN Lindon Romp A, NP      . nicotine polacrilex (NICORETTE) gum 2 mg  2 mg Oral PRN Derrill Center, NP   2 mg at 04/01/19 1512  . perphenazine (TRILAFON) tablet 16  mg  16 mg Oral QHS Connye Burkitt, NP   16 mg at 04/02/19 0111  . perphenazine (TRILAFON) tablet 8 mg  8 mg Oral BID Connye Burkitt, NP   8 mg at 04/02/19 8469  . temazepam (RESTORIL) capsule 45 mg  45 mg Oral QHS Johnn Hai, MD   45 mg at 04/02/19 0111  . traZODone (DESYREL) tablet 50 mg  50 mg Oral QHS PRN Rozetta Nunnery, NP   50 mg at 03/31/19 2111   PTA Medications: Medications Prior to Admission  Medication Sig Dispense Refill Last Dose  . FLUoxetine (PROZAC) 40 MG capsule Take 40 mg by mouth daily.     Marland Kitchen lamoTRIgine (LAMICTAL) 25 MG tablet Take 25 mg by mouth 2 (two) times daily.     . Asenapine Maleate (SAPHRIS) 10 MG SUBL Place 1 tablet (10 mg total) under the tongue 2 (two) times daily. (Patient not taking: Reported on 02/21/2019) 60 tablet 0   . hydrOXYzine (ATARAX/VISTARIL) 25 MG tablet Take 25 mg by mouth every 6 (six) hours as needed for anxiety.     Marland Kitchen perphenazine (TRILAFON) 4  MG tablet Take 2 mg by mouth 2 (two) times daily as needed (mood and psychosis).     . SAPHRIS 5 MG SUBL 24 hr tablet TAKE 1 TABLET BY MOUTH TWICE DAILY (Patient not taking: Reported on 02/21/2019) 60 tablet 0   . traZODone (DESYREL) 50 MG tablet TAKE 1 TABLET BY MOUTH AT BEDTIME AS NEEDED FOR SLEEP (Patient taking differently: Take 50 mg by mouth at bedtime as needed for sleep. ) 30 tablet 0     Patient Stressors: Financial difficulties Marital or family conflict Other: Psychosis  Patient Strengths: Average or above average intelligence General fund of knowledge Motivation for treatment/growth Supportive family/friends  Treatment Modalities: Medication Management, Group therapy, Case management,  1 to 1 session with clinician, Psychoeducation, Recreational therapy.   Physician Treatment Plan for Primary Diagnosis: <principal problem not specified> Long Term Goal(s): Improvement in symptoms so as ready for discharge Improvement in symptoms so as ready for discharge   Short Term Goals: Ability to  disclose and discuss suicidal ideas Ability to demonstrate self-control will improve Ability to identify and develop effective coping behaviors will improve Ability to demonstrate self-control will improve Ability to identify and develop effective coping behaviors will improve Ability to maintain clinical measurements within normal limits will improve  Medication Management: Evaluate patient's response, side effects, and tolerance of medication regimen.  Therapeutic Interventions: 1 to 1 sessions, Unit Group sessions and Medication administration.  Evaluation of Outcomes: Not Progressing  Physician Treatment Plan for Secondary Diagnosis: Active Problems:   Schizophrenia ([facility])  Long Term Goal(s): Improvement in symptoms so as ready for discharge Improvement in symptoms so as ready for discharge   Short Term Goals: Ability to disclose and discuss suicidal ideas Ability to demonstrate self-control will improve Ability to identify and develop effective coping behaviors will improve Ability to demonstrate self-control will improve Ability to identify and develop effective coping behaviors will improve Ability to maintain clinical measurements within normal limits will improve     Medication Management: Evaluate patient's response, side effects, and tolerance of medication regimen.  Therapeutic Interventions: 1 to 1 sessions, Unit Group sessions and Medication administration.  Evaluation of Outcomes: Not Progressing   RN Treatment Plan for Primary Diagnosis: <principal problem not specified> Long Term Goal(s): Knowledge of disease and therapeutic regimen to maintain health will improve  Short Term Goals: Ability to participate in decision making will improve, Ability to verbalize feelings will improve, Ability to disclose and discuss suicidal ideas, Ability to identify and develop effective coping behaviors will improve and Compliance with prescribed medications will  improve  Medication Management: RN will administer medications as ordered by provider, will assess and evaluate patient's response and provide education to patient for prescribed medication. RN will report any adverse and/or side effects to prescribing provider.  Therapeutic Interventions: 1 on 1 counseling sessions, Psychoeducation, Medication administration, Evaluate responses to treatment, Monitor vital signs and CBGs as ordered, Perform/monitor CIWA, COWS, AIMS and Fall Risk screenings as ordered, Perform wound care treatments as ordered.  Evaluation of Outcomes: Not Progressing   LCSW Treatment Plan for Primary Diagnosis: <principal problem not specified> Long Term Goal(s): Safe transition to appropriate next level of care at discharge, Engage patient in therapeutic group addressing interpersonal concerns.  Short Term Goals: Engage patient in aftercare planning with referrals and resources and Increase skills for wellness and recovery  Therapeutic Interventions: Assess for all discharge needs, 1 to 1 time with Social worker, Explore available resources and support systems, Assess for adequacy in community support  network, Educate family and significant other(s) on suicide prevention, Complete Psychosocial Assessment, Interpersonal group therapy.  Evaluation of Outcomes: Not Progressing   Progress in Treatment: Attending groups: Yes. Participating in groups: Yes. Taking medication as prescribed: Yes. Toleration medication: Yes. Family/Significant other contact made: Yes, individual(s) contacted:  pt's mother Patient understands diagnosis: No. Discussing patient identified problems/goals with staff: Yes. Medical problems stabilized or resolved: Yes. Denies suicidal/homicidal ideation: Yes. Issues/concerns per patient self-inventory: No. Other:   New problem(s) identified: No, Describe:  None  New Short Term/Long Term Goal(s): Medication stabilization, elimination of SI thoughts,  and development of a comprehensive mental wellness plan.   Patient Goals:    Discharge Plan or Barriers: CSW will continue to follow up for appropriate referrals and possible discharge planning  Reason for Continuation of Hospitalization: Aggression Delusions  Medication stabilization  Estimated Length of Stay: 2-3 days  Attendees: Patient: 04/02/2019  Physician: Dr. Malvin JohnsBrian Farah, MD 04/02/2019   Nursing: Cletis AthensJon, RN 04/02/2019  RN Care Manager: 04/02/2019   Social Worker: Stephannie PetersJasmine Jeromie Gainor, LCSW 04/02/2019   Recreational Therapist:  04/02/2019   Other:  04/02/2019   Other:  04/02/2019   Other: 04/02/2019      Scribe for Treatment Team: Delphia GratesJasmine M Lyndy Russman, LCSW 04/02/2019 10:25 AM

## 2019-04-02 NOTE — Progress Notes (Signed)
D: Patient is elevated, agitated, anxious. She appears manic, talking about the government, government agencies, "my OCD makes me do it", hyperverbal. Patient denies SI/HI/AVH. Due to elevated behavior, she required medication administration. When approached she became verbally aggressive "I am going to sue you all" and "Ativan makes my PTSD really bad. It makes me forget." Doris, RN spoke with patient's mother discussing her elevated behavior and need for medication. Patient actually cooperated with IM medication administration once staff provided a supportive hold. See notes on such.  A: Patient checked q15 min, and checks reviewed. Reviewed medication changes with patient and educated on side effects. Educated patient on importance of attending group therapy sessions and educated on several coping skills. Encouarged participation in milieu through recreation therapy and attending meals with peers. Support and encouragement provided. Fluids offered. R: Patient receptive to education on medications, and is medication compliant. Patient contracts for safety on the unit.

## 2019-04-02 NOTE — Progress Notes (Signed)
Patient reports on self-inventory: She slept well last night, and did not request medication to help with sleep. Her appetite is good, energy normal and concentration good. She denies depression, hopelessness and anxiety. She denies withdrawal symptoms. Patient denies SI. She complains of a mild headache 1/10, but did not request medication for pain. Goals for today "My release. Stay focused, follow the rules, and don't misbehave.

## 2019-04-03 MED ORDER — TEMAZEPAM 30 MG PO CAPS: 30.0000 mg | ORAL_CAPSULE | Freq: Every day | ORAL | Status: DC

## 2019-04-03 MED ORDER — LAMOTRIGINE 25 MG PO TABS
50.0000 mg | ORAL_TABLET | Freq: Two times a day (BID) | ORAL | Status: DC
  Administered 2019-04-03 – 2019-04-04 (×2): 50 mg via ORAL
  Filled 2019-04-03 (×4): qty 2

## 2019-04-03 MED ORDER — TEMAZEPAM 15 MG PO CAPS
15.0000 mg | ORAL_CAPSULE | Freq: Every day | ORAL | Status: DC
  Administered 2019-04-03: 15 mg via ORAL
  Filled 2019-04-03: qty 1

## 2019-04-03 MED ORDER — TRAZODONE HCL 100 MG PO TABS
100.0000 mg | ORAL_TABLET | Freq: Every day | ORAL | Status: DC
  Administered 2019-04-03: 100 mg via ORAL
  Filled 2019-04-03 (×3): qty 1

## 2019-04-03 NOTE — Progress Notes (Signed)
Patient ID: Stacy Poole, female   DOB: 12-28-1990, 28 y.o.   MRN: [id number]   CSW was contacted by patient's mother, Dominga Mcduffie. Pt's mother asked about discharge tomorrow and the process. Pt's mother stated that she will be able to pick her up around 11am.

## 2019-04-03 NOTE — Progress Notes (Signed)
DAR NOTE: Patient presents with anxious affect and irritable  mood.  Denies suicidal thoughts, auditory and visual hallucinations.  Continues to be verbally aggressive and abusive towards staff.  Very intrusive and impulsive on the unit.  Rates depression at 0, hopelessness at 0, and anxiety at 0.  Maintained on routine safety checks.  Medications given as prescribed.  Support and encouragement offered as needed.  Patient observed socializing with peers in the dayroom.  PRN Ativan 2 mg and Vistaril 25 mg given for agitation and anxiety with poor effect.  Continues to need a lot of redirection on the unit.  Patient is safe on and off the unit.

## 2019-04-03 NOTE — Progress Notes (Signed)
Recreation Therapy Notes  Date: 7.30.20 Time: 1000 Location: 500 Hall Day Room  Group Topic: Leisure Education, Biomedical scientist) Addresses:  Patient will identify positive leisure outlets. Patient will identify benefits of leisure.  Behavioral Response: None  Intervention:  Markers, Electronics engineer, Web designer, Leisure Activities  Activity: Pictionary.  Patient will pull a leisure activity from the container.  Patient will draw that activity on the board.  The remaining patients will attempt to guess what the picture is.  The person who guess the picture correctly, gets the next turn.  Education: Education officer, community, Environmental health practitioner, Discharge Planning    Education Outcome: Acknowledges education  Clinical Observations/Feedback: Pt was unable to focus or sit still.  Pt kept saying she was being triggered by the activity.  Pt left but came back at the end of group and was able to participate for the last five minutes.   Victorino Sparrow, LRT/CTRS         Victorino Sparrow A 04/03/2019 12:25 PM
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
## 2019-04-03 NOTE — Progress Notes (Signed)
Did not attend group 

## 2019-04-03 NOTE — BHH Group Notes (Signed)
[facility] LCSW Group Therapy Note  Date/Time: 04/03/2019 @ 1pm  Type of Therapy and Topic:  Group Therapy:  Overcoming Obstacles  Participation Level:  Active   Description of Group:    In this group patients will be encouraged to explore what they see as obstacles to their own wellness and recovery. They will be guided to discuss their thoughts, feelings, and behaviors related to these obstacles. The group will process together ways to cope with barriers, with attention given to specific choices patients can make. Each patient will be challenged to identify changes they are motivated to make in order to overcome their obstacles. This group will be process-oriented, with patients participating in exploration of their own experiences as well as giving and receiving support and challenge from other group members.  Therapeutic Goals: 1. Patient will identify personal and current obstacles as they relate to admission. 2. Patient will identify barriers that currently interfere with their wellness or overcoming obstacles.  3. Patient will identify feelings, thought process and behaviors related to these barriers. 4. Patient will identify two changes they are willing to make to overcome these obstacles:    Summary of Patient Progress   Patient was engaged and active throughout group therapy. Patient did go on and on about wanting to advocate for her patient rights and asked who she can talk to about concerns that is going on. Patient was able to identify her current obstacle which is that she is not able to have things that she needs to function (such as shoes and hoodies). Patient stated that the barriers that are currently interfering with her overcoming her obstacles is this currently hospitalization and what is allowed and not allowed in the hospital. As well as, her mother since her mother may make her send back the $1400 dollars worth of things that she bought from [employer]. Patient was not able to  identify changes that she will make to overcome these obstacles.    Therapeutic Modalities:   Cognitive Behavioral Therapy Solution Focused Therapy Motivational Interviewing Relapse Prevention Therapy   Ardelle Anton, LCSW

## 2019-04-04 MED ORDER — BENZTROPINE MESYLATE 1 MG PO TABS: 1.0000 mg | ORAL_TABLET | Freq: Two times a day (BID) | ORAL | 2 refills | Status: DC

## 2019-04-04 MED ORDER — NALTREXONE HCL 50 MG PO TABS: 50.0000 mg | ORAL_TABLET | Freq: Every day | ORAL | 1 refills | Status: DC

## 2019-04-04 MED ORDER — PERPHENAZINE 8 MG PO TABS: ORAL_TABLET | ORAL | 2 refills | Status: DC

## 2019-04-04 MED ORDER — LAMOTRIGINE 25 MG PO TABS: 50.0000 mg | ORAL_TABLET | Freq: Two times a day (BID) | ORAL | 2 refills | Status: DC

## 2019-04-04 MED ORDER — FLUOXETINE HCL 20 MG PO CAPS: 20.0000 mg | ORAL_CAPSULE | Freq: Every day | ORAL | 2 refills | Status: DC

## 2019-04-04 MED ORDER — TRAZODONE HCL 100 MG PO TABS: 100.0000 mg | ORAL_TABLET | Freq: Every day | ORAL | 2 refills | Status: DC

## 2019-04-04 NOTE — BHH Suicide Risk Assessment (Signed)
[facility] Discharge Suicide Risk Assessment   Principal Problem: BPAD Discharge Diagnoses: Active Problems:   Schizophrenia ([place])   Total Time spent with patient: 45 minutes  Musculoskeletal: Strength & Muscle Tone: within normal limits Gait & Station: normal Patient leans: N/A  Psychiatric Specialty Exam: ROS  Blood pressure 108/75, pulse 73, temperature 97.9 F (36.6 C), temperature source Oral, resp. rate 16, SpO2 100 %.There is no height or weight on file to calculate BMI.  General Appearance: Casual  Eye Contact::  Good  Speech:  Clear and [id number]  Volume:  Normal  Mood:  Euthymic  Affect:  Constricted  Thought Process:  Goal Directed  Orientation:  Full (Time, Place, and Person)  Thought Content:  Logical  Suicidal Thoughts:  No  Homicidal Thoughts:  neg  Memory:  Immediate;   Fair  Judgement:  Fair  Insight:  Fair  Psychomotor Activity:  Normal  Concentration:  Fair  Recall:  [name] of Knowledge:Fair  Language: Fair  Akathisia:  Negative  Handed:  Right  AIMS (if indicated):     Assets:  Communication Skills Desire for Improvement  Sleep:  Number of Hours: 4.5  Cognition: WNL  ADL's:  Intact   Mental Status Per Nursing Assessment::   On Admission:  Suicidal ideation indicated by patient, Self-harm thoughts, Self-harm behaviors, Suicidal ideation indicated by others, Thoughts of violence towards others  Demographic Factors:  Caucasian and Unemployed  Loss Factors: NA  Historical Factors: Impulsivity  Risk Reduction Factors:   Sense of responsibility to family and Religious beliefs about death  Continued Clinical Symptoms:  Alcohol/Substance Abuse/Dependencies  Cognitive Features That Contribute To Risk:  Polarized thinking    Suicide Risk:  Minimal: No identifiable suicidal ideation.  Patients presenting with no risk factors but with morbid ruminations; may be classified as minimal risk based on the severity of the depressive  symptoms  [place] Follow up on 04/17/2019.   Why: Medication management appointment with [name] is Thursday, 8/13 at 11:00a.  Appointment will be virtual and Dr. Loni Muse wil contact you for the appt.  Contact information: [address] [address] [phone]        BEHAVIORAL HEALTH OUTPATIENT THERAPY [place] Follow up on 04/08/2019.   Specialty: Behavioral Health Why: Substance Abuse counseling with Alver Fisher is Tuesday, 8/4 at 11:00a.  Session will be virtual and [facility] will contact you for that appointment.  Contact information: [place] [id number] Danice Goltz [place] [address] [facility] Follow up on 04/07/2019.   Why: Telephonic therapy with Dr. Rodena Goldmann is Monday, 8/3 at 1:00p.  Please call Dr. Gevena Cotton for the appointment.  Contact information: [address], [place], [place] [address] P: [phone] Fx: [phone]          Plan Of Care/Follow-up recommendations:  Activity:  full  Kaleeyah Cuffie, MD 04/04/2019, 7:39 AM
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
## 2019-04-04 NOTE — Progress Notes (Addendum)
Patient ID: Stacy Poole, female   DOB: [birth year], 28 y.o.   MRN: [id number]   [place] NOVEL CORONAVIRUS (COVID-19) DAILY CHECK-OFF SYMPTOMS - answer yes or no to each - every day NO YES  Have you had a fever in the past 24 hours?  . Fever (Temp > 37.80C / 100F) X   Have you had any of these symptoms in the past 24 hours? . New Cough .  Sore Throat  .  Shortness of Breath .  Difficulty Breathing .  Unexplained Body Aches   X   Have you had any one of these symptoms in the past 24 hours not related to allergies?   . Runny Nose .  Nasal Congestion .  Sneezing   X   If you have had runny nose, nasal congestion, sneezing in the past 24 hours, has it worsened?  X   EXPOSURES - check yes or no X   Have you traveled outside the state in the past 14 days?  X   Have you been in contact with someone with a confirmed diagnosis of COVID-19 or PUI in the past 14 days without wearing appropriate PPE?  X   Have you been living in the same home as a person with confirmed diagnosis of COVID-19 or a PUI (household contact)?    X   Have you been diagnosed with COVID-19?    X              What to do next: Answered NO to all: Answered YES to anything:   Proceed with unit schedule Follow the [facility] Inpatient Flowsheet.

## 2019-04-04 NOTE — Progress Notes (Signed)
  [facility] Adult Case Management Discharge Plan :  Will you be returning to the same living situation after discharge:  Yes,  with mom At discharge, do you have transportation home?: Yes,  pt's mother Do you have the ability to pay for your medications: Yes,  private insurance  Release of information consent forms completed and in the chart;  Patient's signature needed at discharge.  Patient to Follow up at: [place] Follow up on 04/17/2019.   Why: Medication management appointment with [name] is Thursday, 8/13 at 11:00a.  Appointment will be virtual and Dr. Loni Muse wil contact you for the appt.  Contact information: [address] [place] [address] [phone]        BEHAVIORAL HEALTH OUTPATIENT THERAPY [place] Follow up on 04/08/2019.   Specialty: Behavioral Health Why: Substance Abuse counseling with Alver Fisher is Tuesday, 8/4 at 11:00a.  Session will be virtual and [facility] will contact you for that appointment.  Contact information: [place] [phone] Danice Goltz [place] [address] [facility] Follow up on 04/07/2019.   Why: Telephonic therapy with Dr. Rodena Goldmann is Monday, 8/3 at 1:00p.  Please call Dr. Gevena Cotton for the appointment.  Contact information: [address] [place], [place] [address] P: [phone] Fx: [phone]          Next level of care provider has access to [place] and Suicide Prevention discussed: Yes,  with mother  Have you used any form of tobacco in the last 30 days? (Cigarettes, Smokeless Tobacco, Cigars, and/or Pipes): Yes  Has patient been referred to the Quitline?: Yes, faxed on 04/04/2019  Patient has been referred for addiction treatment: Yes  Trecia Rogers, LCSW 04/04/2019, 10:41 AM

## 2019-04-04 NOTE — Progress Notes (Signed)
Recreation Therapy Notes  Date: 7.31.20 Time: 1000 Location: 500 Hall Dayroom  Group Topic: Goal Setting  Goal Area(s) Addresses:  Patient will be able to identify at least 3 life goals.  Patient will be able to identify benefit of investing in life goals.  Patient will be able to identify benefit of setting life goals.   Behavioral Response:  None  Intervention: Worksheet  Activity: Goal Planning.  Patients were to set goals for the week, month, year and five years.  Patients were to also identify any obstacles preventing from reaching their goals, what they need to achieve their goals and what they can start doing now to work towards their goals.  Education:  Discharge Planning, Radiographer, therapeutic, Leisure Education   Education Outcome: Acknowledges Education/In Group Clarification Provided/Needs Additional Education  Clinical Observations: Pt was given a Radio producer.  Pt stated she wasn't in the right mental state to complete it.  Pt also stated she was done doing her work.    Victorino Sparrow, LRT/CTRS    Ria Comment, Mazi Schuff A 04/04/2019 11:08 AM

## 2019-04-04 NOTE — Progress Notes (Signed)
Patient ID: Stacy Poole, female   DOB: [birth year], 28 y.o.   MRN: [id number]   D: Pt alert and oriented on the unit.   A: Education, support, and encouragement provided. Discharge summary, medications and follow up appointments reviewed with pt. Suicide prevention resources provided, including "My 3 App." Pt's belongings in locker # 28 returned and belongings sheet signed.  R: Pt denies SI/HI, A/VH, pain, or any concerns at this time. Pt ambulatory on and off unit. Pt discharged to lobby.

## 2019-04-04 NOTE — Progress Notes (Signed)
Patient ID: Stacy Poole, female   DOB: 1991-03-20, 28 y.o.   MRN: [id number] D: Patient pacing up and down the hall talking to self. Pt is very intrusive and needing constant redirection. Pt is preoccupied, paranoid about been in the [employer] and people stealing her belongings.   A: Support and encouragement offered as needed to express needs. Medications administered as prescribed.  R: Patient is safe on unit. Will continue to monitor  for safety and stability.

## 2019-04-04 NOTE — Progress Notes (Signed)
Patient ID: Stacy Poole, female   DOB: 1990-10-08, 28 y.o.   MRN: [id number]  CSW was contacted by patient's outpatient therapy, L[facility]. Hermelinda Dellen stated that she does not want to proceed with outpatient therapy with the patient and that she needs higher level of care. Lizabeth discussed PHP and ACTT team.  CSW discussed these options with the patient who stated that her mother needs to be involved.  CSW contacted patient's mother who stated that she needs to sit down with the patient and discuss these options. Patient's mother stated to keep everything the same and she and the patient will sit down and discuss and then do referrals with the patient's outpatient providers.  CSW confirmed with patient and called back her outpatient therapist, Hermelinda Dellen, to let her know. CSW gave ACTT team referrals.

## 2019-04-04 NOTE — Tx Team (Signed)
Interdisciplinary Treatment and Diagnostic Plan Update  04/04/2019 Time of Session: 09:06am Stacy PilaStephanie W Axelson MRN: [id number]  Principal Diagnosis: <principal problem not specified>  Secondary Diagnoses: Active Problems:   Schizophrenia (HCC)   Current Medications:  Current Facility-Administered Medications  Medication Dose Route Frequency Provider Last Rate Last Dose  . acetaminophen (TYLENOL) tablet 650 mg  650 mg Oral Q6H PRN Nira ConnBerry, Jason A, NP   650 mg at 04/03/19 1641  . alum & mag hydroxide-simeth (MAALOX/MYLANTA) 200-200-20 MG/5ML suspension 30 mL  30 mL Oral Q4H PRN Nira ConnBerry, Jason A, NP      . benztropine (COGENTIN) tablet 1 mg  1 mg Oral BID Malvin JohnsFarah, Brian, MD   1 mg at 04/04/19 0758  . diphenhydrAMINE (BENADRYL) capsule 50 mg  50 mg Oral Q4H PRN Malvin JohnsFarah, Brian, MD   50 mg at 04/02/19 1536   Or  . diphenhydrAMINE (BENADRYL) injection 50 mg  50 mg Intramuscular Q6H PRN Malvin JohnsFarah, Brian, MD   50 mg at 04/02/19 [id number]  . haloperidol (HALDOL) tablet 5 mg  5 mg Oral Q6H PRN Malvin JohnsFarah, Brian, MD   5 mg at 04/04/19 [id number]   Or  . haloperidol lactate (HALDOL) injection 10 mg  10 mg Intramuscular Q6H PRN Malvin JohnsFarah, Brian, MD   10 mg at 04/02/19 [id number]  . hydrOXYzine (ATARAX/VISTARIL) tablet 25 mg  25 mg Oral TID PRN Jackelyn PolingBerry, Jason A, NP   25 mg at 04/04/19 0704  . lamoTRIgine (LAMICTAL) tablet 50 mg  50 mg Oral BID Malvin JohnsFarah, Brian, MD   50 mg at 04/04/19 0758  . LORazepam (ATIVAN) tablet 2 mg  2 mg Oral Q4H PRN Malvin JohnsFarah, Brian, MD   2 mg at 04/03/19 0820   Or  . LORazepam (ATIVAN) injection 2 mg  2 mg Intramuscular Q4H PRN Malvin JohnsFarah, Brian, MD      . magnesium hydroxide (MILK OF MAGNESIA) suspension 30 mL  30 mL Oral Daily PRN Nira ConnBerry, Jason A, NP      . nicotine polacrilex (NICORETTE) gum 2 mg  2 mg Oral PRN Oneta RackLewis, Tanika N, NP   2 mg at 04/04/19 0904  . perphenazine (TRILAFON) tablet 16 mg  16 mg Oral QHS Aldean BakerSykes, Janet E, NP   16 mg at 04/03/19 2101  . perphenazine (TRILAFON) tablet 8 mg  8 mg Oral BID Aldean BakerSykes, Janet E, NP    8 mg at 04/04/19 0901  . temazepam (RESTORIL) capsule 15 mg  15 mg Oral QHS Malvin JohnsFarah, Brian, MD   15 mg at 04/03/19 2101  . traZODone (DESYREL) tablet 100 mg  100 mg Oral QHS Malvin JohnsFarah, Brian, MD   100 mg at 04/03/19 2101   PTA Medications: Medications Prior to Admission  Medication Sig Dispense Refill Last Dose  . FLUoxetine (PROZAC) 40 MG capsule Take 40 mg by mouth daily.     Marland Kitchen. lamoTRIgine (LAMICTAL) 25 MG tablet Take 25 mg by mouth 2 (two) times daily.     . Asenapine Maleate (SAPHRIS) 10 MG SUBL Place 1 tablet (10 mg total) under the tongue 2 (two) times daily. (Patient not taking: Reported on 02/21/2019) 60 tablet 0   . hydrOXYzine (ATARAX/VISTARIL) 25 MG tablet Take 25 mg by mouth every 6 (six) hours as needed for anxiety.     Marland Kitchen. perphenazine (TRILAFON) 4 MG tablet Take 2 mg by mouth 2 (two) times daily as needed (mood and psychosis).     . SAPHRIS 5 MG SUBL 24 hr tablet TAKE 1 TABLET BY MOUTH TWICE DAILY (Patient not taking:  Reported on 02/21/2019) 60 tablet 0   . traZODone (DESYREL) 50 MG tablet TAKE 1 TABLET BY MOUTH AT BEDTIME AS NEEDED FOR SLEEP (Patient taking differently: Take 50 mg by mouth at bedtime as needed for sleep. ) 30 tablet 0     Patient Stressors: Financial difficulties Marital or family conflict Other: Psychosis  Patient Strengths: Average or above average intelligence General fund of knowledge Motivation for treatment/growth Supportive family/friends  Treatment Modalities: Medication Management, Group therapy, Case management,  1 to 1 session with clinician, Psychoeducation, Recreational therapy.   Physician Treatment Plan for Primary Diagnosis: <principal problem not specified> Long Term Goal(s): Improvement in symptoms so as ready for discharge Improvement in symptoms so as ready for discharge   Short Term Goals: Ability to disclose and discuss suicidal ideas Ability to demonstrate self-control will improve Ability to identify and develop effective coping  behaviors will improve Ability to demonstrate self-control will improve Ability to identify and develop effective coping behaviors will improve Ability to maintain clinical measurements within normal limits will improve  Medication Management: Evaluate patient's response, side effects, and tolerance of medication regimen.  Therapeutic Interventions: 1 to 1 sessions, Unit Group sessions and Medication administration.  Evaluation of Outcomes: Adequate for Discharge  Physician Treatment Plan for Secondary Diagnosis: Active Problems:   Schizophrenia ([place])  Long Term Goal(s): Improvement in symptoms so as ready for discharge Improvement in symptoms so as ready for discharge   Short Term Goals: Ability to disclose and discuss suicidal ideas Ability to demonstrate self-control will improve Ability to identify and develop effective coping behaviors will improve Ability to demonstrate self-control will improve Ability to identify and develop effective coping behaviors will improve Ability to maintain clinical measurements within normal limits will improve     Medication Management: Evaluate patient's response, side effects, and tolerance of medication regimen.  Therapeutic Interventions: 1 to 1 sessions, Unit Group sessions and Medication administration.  Evaluation of Outcomes: Adequate for Discharge   RN Treatment Plan for Primary Diagnosis: <principal problem not specified> Long Term Goal(s): Knowledge of disease and therapeutic regimen to maintain health will improve  Short Term Goals: Ability to participate in decision making will improve, Ability to verbalize feelings will improve, Ability to disclose and discuss suicidal ideas, Ability to identify and develop effective coping behaviors will improve and Compliance with prescribed medications will improve  Medication Management: RN will administer medications as ordered by provider, will assess and evaluate patient's response and  provide education to patient for prescribed medication. RN will report any adverse and/or side effects to prescribing provider.  Therapeutic Interventions: 1 on 1 counseling sessions, Psychoeducation, Medication administration, Evaluate responses to treatment, Monitor vital signs and CBGs as ordered, Perform/monitor CIWA, COWS, AIMS and Fall Risk screenings as ordered, Perform wound care treatments as ordered.  Evaluation of Outcomes: Adequate for Discharge   LCSW Treatment Plan for Primary Diagnosis: <principal problem not specified> Long Term Goal(s): Safe transition to appropriate next level of care at discharge, Engage patient in therapeutic group addressing interpersonal concerns.  Short Term Goals: Engage patient in aftercare planning with referrals and resources and Increase skills for wellness and recovery  Therapeutic Interventions: Assess for all discharge needs, 1 to 1 time with Social worker, Explore available resources and support systems, Assess for adequacy in community support network, Educate family and significant other(s) on suicide prevention, Complete Psychosocial Assessment, Interpersonal group therapy.  Evaluation of Outcomes: Adequate for Discharge   Progress in Treatment: Attending groups: Yes. Participating in groups: Yes. Taking  medication as prescribed: Yes. Toleration medication: Yes. Family/Significant other contact made: Yes, individual(s) contacted:  pt's mother Patient understands diagnosis: No. Discussing patient identified problems/goals with staff: Yes. Medical problems stabilized or resolved: Yes. Denies suicidal/homicidal ideation: Yes. Issues/concerns per patient self-inventory: No. Other:   New problem(s) identified: No, Describe:  None  New Short Term/Long Term Goal(s): Medication stabilization, elimination of SI thoughts, and development of a comprehensive mental wellness plan.   Patient Goals:    Discharge Plan or Barriers: Patient will  be discharged back home with psychiatric and mental health therapy services  Reason for Continuation of Hospitalization: Patient is discharging today.   Estimated Length of Stay: Patient is discharging today.   Attendees: Patient: 04/04/2019   Physician: Dr. Malvin JohnsBrian Farah, MD 04/04/2019   Nursing: Ethelene BrownsAnthony, RN 04/04/2019  RN Care Manager: 04/04/2019   Social Worker: Stephannie PetersJasmine Feliz Herard, LCSW 04/04/2019   Recreational Therapist:  04/04/2019  Other:  04/04/2019  Other:  04/04/2019  Other: 04/04/2019      Scribe for Treatment Team: Delphia GratesJasmine M Hazim Treadway, LCSW 04/04/2019 11:16 AM

## 2019-04-08 ENCOUNTER — Other Ambulatory Visit: Payer: Self-pay

## 2019-04-08 ENCOUNTER — Encounter (HOSPITAL_COMMUNITY): Payer: Self-pay | Admitting: Licensed Clinical Social Worker

## 2019-04-08 ENCOUNTER — Ambulatory Visit (INDEPENDENT_AMBULATORY_CARE_PROVIDER_SITE_OTHER): Payer: BC Managed Care – PPO | Admitting: Licensed Clinical Social Worker

## 2019-04-08 DIAGNOSIS — F250 Schizoaffective disorder, bipolar type: Secondary | ICD-10-CM

## 2019-04-08 DIAGNOSIS — F1020 Alcohol dependence, uncomplicated: Secondary | ICD-10-CM | POA: Diagnosis not present

## 2019-04-08 NOTE — Progress Notes (Addendum)
Virtual Visit via Phone Note  I connected with Stacy Poole on 04/08/19 at 11:00 AM EDT by a phone enabled telemedicine application and verified that I am speaking with the correct person using two identifiers.   I discussed the limitations of evaluation and management by telemedicine and the availability of in person appointments. The patient expressed understanding and agreed to proceed.  History of Present Illness: Pt is self referred after discharge from [facility] and [place] for schitzoaffective disorder, bipolar type, Alcohol Use Disorder, Severe    Observations/Objective: Pt presents for her telehealth session by phone due to computer issues.     Assessment and Plan: Pt presents for her phone session asking for a referral to another therapist who has experience working with schizoaffective disorder. Suggested ACT team and PHP programs. She is not interested and does not feel she needs that level of care. Counselor will reach out to [place], [place], at [facility] OP for referrals in the community. Pt reports she doesn't need a therapist for her Alcohol use disorder, because she's taking antabuse currently. Counselor told pt she will email with some names of counselors. Pt was in agreement.  PLAN: refer out to another therapist in the community. Discharge from [facility]     Follow Up Instructions:    I discussed the assessment and treatment plan with the patient. The patient was provided an opportunity to ask questions and all were answered. The patient agreed with the plan and demonstrated an understanding of the instructions.   The patient was advised to call back or seek an in-person evaluation if the symptoms worsen or if the condition fails to improve as anticipated.  I provided 9 minutes of non-face-to-face time during this encounter.   MACKENZIE,LISBETH S, LCAS

## 2019-10-06 ENCOUNTER — Other Ambulatory Visit: Payer: Self-pay

## 2019-10-06 ENCOUNTER — Emergency Department (HOSPITAL_COMMUNITY)
Admission: EM | Admit: 2019-10-06 | Discharge: 2019-10-06 | Disposition: A | Discharge status: Home / Self Care | Payer: BC Managed Care – PPO | Attending: Emergency Medicine | Admitting: Emergency Medicine

## 2019-10-06 ENCOUNTER — Encounter (HOSPITAL_COMMUNITY): Payer: Self-pay | Admitting: *Deleted

## 2019-10-06 DIAGNOSIS — Z79899 Other long term (current) drug therapy: Secondary | ICD-10-CM | POA: Insufficient documentation

## 2019-10-06 DIAGNOSIS — F22 Delusional disorders: Secondary | ICD-10-CM | POA: Diagnosis present

## 2019-10-06 DIAGNOSIS — F259 Schizoaffective disorder, unspecified: Secondary | ICD-10-CM | POA: Diagnosis not present

## 2019-10-06 DIAGNOSIS — Z20822 Contact with and (suspected) exposure to covid-19: Secondary | ICD-10-CM | POA: Insufficient documentation

## 2019-10-06 DIAGNOSIS — F17210 Nicotine dependence, cigarettes, uncomplicated: Secondary | ICD-10-CM | POA: Diagnosis not present

## 2019-10-06 LAB — RAPID URINE DRUG SCREEN, HOSP PERFORMED
Amphetamines: NOT DETECTED
Barbiturates: NOT DETECTED
Benzodiazepines: NOT DETECTED
Cocaine: NOT DETECTED
Opiates: NOT DETECTED
Tetrahydrocannabinol: NOT DETECTED

## 2019-10-06 LAB — CBC WITH DIFFERENTIAL/PLATELET
Abs Immature Granulocytes: 0.06 10*3/uL (ref 0.00–0.07)
Basophils Absolute: 0 10*3/uL (ref 0.0–0.1)
Basophils Relative: 0 %
Eosinophils Absolute: 0 10*3/uL (ref 0.0–0.5)
Eosinophils Relative: 0 %
HCT: 40.1 % (ref 36.0–46.0)
Hemoglobin: 13.4 g/dL (ref 12.0–15.0)
Immature Granulocytes: 1 %
Lymphocytes Relative: 9 %
Lymphs Abs: 1 10*3/uL (ref 0.7–4.0)
MCH: 28.9 pg (ref 26.0–34.0)
MCHC: 33.4 g/dL (ref 30.0–36.0)
MCV: 86.4 fL (ref 80.0–100.0)
Monocytes Absolute: 0.4 10*3/uL (ref 0.1–1.0)
Monocytes Relative: 4 %
Neutro Abs: 9.6 10*3/uL — ABNORMAL HIGH (ref 1.7–7.7)
Neutrophils Relative %: 86 %
Platelets: 283 10*3/uL (ref 150–400)
RBC: 4.64 MIL/uL (ref 3.87–5.11)
RDW: 12.8 % (ref 11.5–15.5)
WBC: 11.1 10*3/uL — ABNORMAL HIGH (ref 4.0–10.5)
nRBC: 0 % (ref 0.0–0.2)

## 2019-10-06 LAB — COMPREHENSIVE METABOLIC PANEL
ALT: 16 U/L (ref 0–44)
AST: 19 U/L (ref 15–41)
Albumin: 4.5 g/dL (ref 3.5–5.0)
Alkaline Phosphatase: 45 U/L (ref 38–126)
Anion gap: 13 (ref 5–15)
BUN: 6 mg/dL (ref 6–20)
CO2: 18 mmol/L — ABNORMAL LOW (ref 22–32)
Calcium: 9.1 mg/dL (ref 8.9–10.3)
Chloride: 100 mmol/L (ref 98–111)
Creatinine, Ser: 0.59 mg/dL (ref 0.44–1.00)
GFR calc Af Amer: >60 mL/min (ref >60)
GFR calc non Af Amer: >60 mL/min (ref >60)
Glucose, Bld: 162 mg/dL — ABNORMAL HIGH (ref 70–99)
Potassium: 3.3 mmol/L — ABNORMAL LOW (ref 3.5–5.1)
Sodium: 131 mmol/L — ABNORMAL LOW (ref 135–145)
Total Bilirubin: 0.6 mg/dL (ref 0.3–1.2)
Total Protein: 7.7 g/dL (ref 6.5–8.1)

## 2019-10-06 LAB — RESPIRATORY PANEL BY RT PCR (FLU A&B, COVID)
Influenza A by PCR: NEGATIVE
Influenza B by PCR: NEGATIVE
SARS Coronavirus 2 by RT PCR: NEGATIVE

## 2019-10-06 LAB — ETHANOL: Alcohol, Ethyl (B): <10 mg/dL (ref <10)

## 2019-10-06 LAB — I-STAT BETA HCG BLOOD, ED (MC, WL, AP ONLY): I-stat hCG, quantitative: <5 m[IU]/mL (ref <5)

## 2019-10-06 MED ORDER — ACETAMINOPHEN 325 MG PO TABS: 650.0000 mg | ORAL_TABLET | ORAL | Status: DC | PRN

## 2019-10-06 MED ORDER — ZIPRASIDONE MESYLATE 20 MG IM SOLR
10.0000 mg | Freq: Once | INTRAMUSCULAR | Status: AC
  Administered 2019-10-06: 10 mg via INTRAMUSCULAR
  Filled 2019-10-06: qty 20

## 2019-10-06 MED ORDER — OLANZAPINE 10 MG PO TABS
10.0000 mg | ORAL_TABLET | Freq: Every day | ORAL | Status: DC
  Administered 2019-10-06: 10 mg via ORAL
  Filled 2019-10-06: qty 1

## 2019-10-06 MED ORDER — STERILE WATER FOR INJECTION IJ SOLN
INTRAMUSCULAR | Status: AC
  Administered 2019-10-06: 0.6 mL
  Filled 2019-10-06: qty 10

## 2019-10-06 MED ORDER — ALUM & MAG HYDROXIDE-SIMETH 200-200-20 MG/5ML PO SUSP: 30.0000 mL | Freq: Four times a day (QID) | ORAL | Status: DC | PRN

## 2019-10-06 NOTE — ED Notes (Signed)
Pt out in hallway stating I need to drink water because I drink multiple bottles of water a day. Pt given cup of water by Patty D. RN. Security outside of door.

## 2019-10-06 NOTE — ED Notes (Signed)
Pt provided with blankets and pillow along with graham crackers and peanut butter.

## 2019-10-06 NOTE — ED Notes (Signed)
Pt given [place] sandwich and ginger ale. Pt ambulated back to room without difficulty.
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
## 2019-10-06 NOTE — BH Assessment (Signed)
Tele Assessment Note   Patient Name: Stacy Poole MRN: [id number] Referring Physician: Claudette Stapler, PA-C Location of Patient: [facility] Location of Provider: Behavioral Health TTS Department  Stacy Poole is an 29 y.o. female presenting to the ED with paranoia. Patient denied SI, HI and psychosis. When asked, why are you here, patient stated, "I don't know yet". Patient reported her friend from high school brought her to the ED. Patient stated she drank Robitussin in order to stay sober as she does not want to drink alcohol. Patient denied alcohol and drug usage. Patient had her hands taped up, stating "feel like I can paint again". Patient denied depressive symptoms. Patient reported good sleep and appetite. As assessment progressed patient continued to state "I am feeling better now, I really feel good now I am not going to hurt myself".   Patient was last inpatient at [facility] 03/2019 and 05/2018. Patient is currently being seen by Dr. [name] for medication management. Patient reported medications are working but wants to talk to Dr. [name] about a medication adjustment. Patient is compliant with medication. Patient reported missing her appointment on last week. Patient is currently seeing [name] at [facility] for outpatient therapy.   Collateral Contact: Rennis Golden, mother, [phone] Okey Regal stated patient has a history of having manic episodes, but has been very stable for a while. Okey Regal stated yesterday patient was fine. Today patients friend called her at work and stated he was taking her to ED because she stated she wasn't feeling well. Mother reported that patient is not a threat to herself or anyone else and that she will be able to take of patient when she is discharged. Mother stated that inpatient treatment is not what patient needs. Mother reported patient started new job and it will be more detrimental to patient if she is not able to keep her job.  Mother reported that she will come and pick patient up from ED tonight. Mother reported that she will ensure that patient follows up with Dr. [name].  Diagnosis: Schizoaffective disorder  Past Medical History:  Past Medical History:  Diagnosis Date  . Acute ear infection   . Anxiety   . Arachnoid cyst   . Fifth disease   . Insomnia   . Mental disorder   . Mononucleosis   . PTSD (post-traumatic stress disorder)   . Substance abuse (HCC)     History reviewed. No pertinent surgical history.  Family History: No family history on file.  Social History:  reports that she has been smoking cigarettes. She has been smoking about 0.50 packs per day. She has never used smokeless tobacco. She reports current alcohol use of about 10.0 standard drinks of alcohol per week. She reports current drug use. Drugs: Codeine, Benzodiazepines, and Other-see comments.  Additional Social History:  Alcohol / Drug Use Pain Medications: see MAR Prescriptions: see MAR Over the Counter: see MAR  CIWA: CIWA-Ar BP: (!) 152/99 Pulse Rate: (!) 109 COWS:    Allergies:  Allergies  Allergen Reactions  . Tetracyclines & Related Other (See Comments)    Ear popping, couldn't move neck/back, and had blind spots/double vision    Home Medications: (Not in a hospital admission)   OB/GYN Status:  No LMP recorded.  General Assessment Data Location of Assessment: [facility] TTS Assessment: In system Is this a Tele or Face-to-Face Assessment?: Tele Assessment Is this an Initial Assessment or a Re-assessment for this encounter?: Initial Assessment Patient Accompanied by:: N/A Language Other  than English: No Living Arrangements: (family home) What gender do you identify as?: Female Marital status: Single Living Arrangements: Parent Can pt return to current living arrangement?: Yes Admission Status: Voluntary Is patient capable of signing voluntary admission?: Yes Referral Source: Self/Family/Friend      Crisis Care Plan Living Arrangements: Parent Legal Guardian: Other:(self) Name of Psychiatrist: ([name]) Name of Therapist: Jola Babinski with [facility])  Education Status Is patient currently in school?: No Is the patient employed, unemployed or receiving disability?: Employed  Risk to self with the past 6 months Suicidal Ideation: No Has patient been a risk to self within the past 6 months prior to admission? : No Suicidal Intent: No Has patient had any suicidal intent within the past 6 months prior to admission? : No Is patient at risk for suicide?: No Suicidal Plan?: No Has patient had any suicidal plan within the past 6 months prior to admission? : No Access to Means: No What has been your use of drugs/alcohol within the last 12 months?: (alcohol) Previous Attempts/Gestures: No How many times?: (0) Other Self Harm Risks: (none) Triggers for Past Attempts: (n/a) Intentional Self Injurious Behavior: None Family Suicide History: No Recent stressful life event(s): (paranoia) Persecutory voices/beliefs?: No Depression: No Depression Symptoms: (none reported) Substance abuse history and/or treatment for substance abuse?: No Suicide prevention information given to non-admitted patients: Not applicable  Risk to Others within the past 6 months Homicidal Ideation: No Does patient have any lifetime risk of violence toward others beyond the six months prior to admission? : No Thoughts of Harm to Others: No Current Homicidal Intent: No Current Homicidal Plan: No Access to Homicidal Means: No Identified Victim: (n/a) History of harm to others?: No Assessment of Violence: None Noted Violent Behavior Description: (none) Does patient have access to weapons?: No Criminal Charges Pending?: No Does patient have a court date: No Is patient on probation?: No  Psychosis Hallucinations: None noted Delusions: Unspecified(paranoia)  Mental Status  Report Appearance/Hygiene: Unremarkable Eye Contact: Good Motor Activity: Freedom of movement Speech: Soft Level of Consciousness: Alert Mood: Anxious Affect: Anxious Anxiety Level: Moderate Thought Processes: Coherent Judgement: Partial Orientation: Person, Place, Time, Situation Obsessive Compulsive Thoughts/Behaviors: None  Cognitive Functioning Concentration: Fair Memory: Recent Intact Is patient IDD: No Insight: Poor Impulse Control: Poor Appetite: Good Have you had any weight changes? : No Change Sleep: No Change Total Hours of Sleep: ("a lot with medicine") Vegetative Symptoms: None  ADLScreening [facility] Assessment Services) Patient's cognitive ability adequate to safely complete daily activities?: Yes Patient able to express need for assistance with ADLs?: Yes Independently performs ADLs?: Yes (appropriate for developmental age)  Prior Inpatient Therapy Prior Inpatient Therapy: Yes Prior Therapy Dates: (2020 ) Prior Therapy Facilty/Provider(s): ([facility]) Reason for Treatment: (mental illness)  Prior Outpatient Therapy Prior Outpatient Therapy: Yes Prior Therapy Dates: (present) Prior Therapy Facilty/Provider(s): ([name] and therapist Jola Babinski) Reason for Treatment: (schizoaffective disorder) Does patient have an ACCT team?: No Does patient have Intensive In-House Services?  : No Does patient have [facility] services? : No Does patient have P4CC services?: No  ADL Screening (condition at time of admission) Patient's cognitive ability adequate to safely complete daily activities?: Yes Patient able to express need for assistance with ADLs?: Yes Independently performs ADLs?: Yes (appropriate for developmental age)  Advance Directives (For Healthcare) Does Patient Have a Medical Advance Directive?: No  Disposition:  Disposition Initial Assessment Completed for this Encounter: Yes  Lindon Romp, NP, patient does not meet inpatient criteria.  Patient  will follow-up with Dr. [name] regarding medication questions/medication adjustments which patient is requesting. Patients mother stated she would pick up patient and make sure she follows up with Dr. [name].   This service was provided via telemedicine using a 2-way, interactive audio and video technology.  Names of all persons participating in this telemedicine service and their role in this encounter. Name: Stacy Poole Role: Patient  Name: Rennis Golden Role: Mother  Name: Al Corpus Role: TTS Clinician  Name:  Role:     Burnetta Sabin 10/06/2019 9:44 PM

## 2019-10-06 NOTE — ED Notes (Signed)
Pt speech is pressured and flight of ideas. Pt took Geodon injection without difficulty. TTS machine placed in room for assessment and pt was given crackers, cheese, and water.

## 2019-10-14 ENCOUNTER — Inpatient Hospital Stay (HOSPITAL_COMMUNITY)
Admission: RE | Admit: 2019-10-14 | Discharge: 2019-10-24 | DRG: 885 | Disposition: A | Discharge status: Home / Self Care | Payer: BC Managed Care – PPO | Attending: Psychiatry | Admitting: Psychiatry

## 2019-10-14 ENCOUNTER — Encounter (HOSPITAL_COMMUNITY): Payer: Self-pay | Admitting: Psychiatry

## 2019-10-14 ENCOUNTER — Other Ambulatory Visit: Payer: Self-pay

## 2019-10-14 DIAGNOSIS — F259 Schizoaffective disorder, unspecified: Principal | ICD-10-CM | POA: Diagnosis present

## 2019-10-14 DIAGNOSIS — F4310 Post-traumatic stress disorder, unspecified: Secondary | ICD-10-CM | POA: Diagnosis present

## 2019-10-14 DIAGNOSIS — F17210 Nicotine dependence, cigarettes, uncomplicated: Secondary | ICD-10-CM | POA: Diagnosis present

## 2019-10-14 DIAGNOSIS — F250 Schizoaffective disorder, bipolar type: Secondary | ICD-10-CM | POA: Diagnosis not present

## 2019-10-14 DIAGNOSIS — Z881 Allergy status to other antibiotic agents status: Secondary | ICD-10-CM

## 2019-10-14 DIAGNOSIS — Z20822 Contact with and (suspected) exposure to covid-19: Secondary | ICD-10-CM | POA: Diagnosis present

## 2019-10-14 LAB — RESPIRATORY PANEL BY RT PCR (FLU A&B, COVID)
Influenza A by PCR: NEGATIVE
Influenza B by PCR: NEGATIVE
SARS Coronavirus 2 by RT PCR: NEGATIVE

## 2019-10-14 MED ORDER — ALUM & MAG HYDROXIDE-SIMETH 200-200-20 MG/5ML PO SUSP
30.0000 mL | ORAL | Status: DC | PRN
  Administered 2019-10-17 – 2019-10-23 (×5): 30 mL via ORAL

## 2019-10-14 MED ORDER — ACETAMINOPHEN 325 MG PO TABS
650.0000 mg | ORAL_TABLET | Freq: Four times a day (QID) | ORAL | Status: DC | PRN
  Administered 2019-10-16 – 2019-10-23 (×16): 650 mg via ORAL
  Filled 2019-10-14 (×17): qty 2

## 2019-10-14 MED ORDER — MAGNESIUM HYDROXIDE 400 MG/5ML PO SUSP
30.0000 mL | Freq: Every day | ORAL | Status: DC | PRN
  Administered 2019-10-22: 30 mL via ORAL

## 2019-10-14 MED ORDER — HYDROXYZINE HCL 25 MG PO TABS
25.0000 mg | ORAL_TABLET | Freq: Three times a day (TID) | ORAL | Status: DC | PRN
  Administered 2019-10-15 – 2019-10-24 (×15): 25 mg via ORAL
  Filled 2019-10-14 (×16): qty 1

## 2019-10-14 MED ORDER — TRAZODONE HCL 50 MG PO TABS
50.0000 mg | ORAL_TABLET | Freq: Every evening | ORAL | Status: DC | PRN
  Administered 2019-10-15 – 2019-10-16 (×2): 50 mg via ORAL
  Filled 2019-10-14 (×3): qty 1

## 2019-10-14 NOTE — BH Assessment (Signed)
Assessment Note  Stacy Poole is an 29 y.o. female presenting as a walk-in to [facility] with SI and mania. Patient presents as hyper verbal and with flight of ideas. Patient denied SI plan. Patient reported onset of SI was today and triggered by her mother pressuring her to take Anibuse medicine, which is for alcoholics. Patient reported her mother triggered her PTSD. Patient then stated, "my mother is white and I don't trust white people". 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 Patient reported drinking alcohol from [place] today at 3pm. Patient denied depressive symptoms, however checked following on her intake form, guilt, worthlessness, wanting to be alone and increased anxiety. Patient reported "my memory is fragmented due to many concussions". 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   Patient was last inpatient at [place] 03/2019 and 05/2018. Patient was also assessed on 10/06/19, patient did not meet inpatient criteria at that time and was instructed to follow-up with her psychiatrist [name] for medication management. Patient reported medications are working but wants to talk to [name] about a medication adjustment. Patient is compliant with medication. Patient reported missing her appointment on last week. Patient is currently seeing Jola Babinski at [facility] for outpatient therapy.   Diagnosis: Schizoaffective disorder  Past Medical History:  Past Medical History:  Diagnosis Date  . Acute ear infection   . Anxiety   . Arachnoid cyst   . Fifth disease   . Insomnia   . Mental disorder   . Mononucleosis   . PTSD (post-traumatic stress disorder)   . Substance abuse ([place])     No  past surgical history on file.  Family History: No family history on file.  Social History:  reports that she has been smoking cigarettes. She has been smoking about 0.50 packs per day. She has never used smokeless tobacco. She reports current alcohol use of about 10.0 standard drinks of alcohol per week. She reports current drug use. Drugs: Codeine, Benzodiazepines, and Other-see comments.  Additional Social History:  Alcohol / Drug Use Pain Medications: see MAR Prescriptions: see MAR Over the Counter: see MAR  CIWA:   COWS:    Allergies:  Allergies  Allergen Reactions  . Tetracyclines & Related Other (See Comments)    Ear popping, couldn't move neck/back, and had blind spots/double vision    Home Medications:  Medications Prior to Admission  Medication Sig Dispense Refill  . amantadine (SYMMETREL) 100 MG capsule Take 100 mg by mouth every evening.     . benztropine (COGENTIN) 1 MG tablet Take 1 tablet (1 mg total) by mouth 2 (two) times daily. (Patient not taking: Reported on 10/06/2019) 60 tablet 2  . disulfiram (ANTABUSE) 250 MG tablet Take 250 mg by mouth daily.    Marland Kitchen FLUoxetine (PROZAC) 20 MG capsule Take 1 capsule (20 mg total) by mouth daily. (Patient taking differently: Take 20-40 mg by mouth See admin instructions. 40 MG in the morning and 20 MG in the evening) 30 capsule 2  . lamoTRIgine (LAMICTAL) 25 MG tablet Take 2 tablets (50 mg total) by mouth 2 (two) times daily. (Patient taking differently: Take 25-50 mg by mouth See admin instructions. 25 MG in the morning and 50 MG in the evening) 120 tablet  2  . naltrexone (DEPADE) 50 MG tablet Take 1 tablet (50 mg total) by mouth daily. (Patient taking differently: Take 50 mg by mouth every evening. ) 90 tablet 1  . perphenazine (TRILAFON) 4 MG tablet Take 4 mg by mouth at bedtime.      OB/GYN Status:  No LMP recorded.  General Assessment Data Location of Assessment: [facility] Assessment Services TTS Assessment: In system Is this a  Tele or Face-to-Face Assessment?: Tele Assessment Is this an Initial Assessment or a Re-assessment for this encounter?: Initial Assessment Patient Accompanied by:: N/A Language Other than English: No Living Arrangements: (family home) What gender do you identify as?: Female Marital status: Single Living Arrangements: Parent Can pt return to current living arrangement?: Yes Admission Status: Voluntary Is patient capable of signing voluntary admission?: Yes Referral Source: Self/Family/Friend  Crisis Care Plan Living Arrangements: Parent Legal Guardian: Other:(self) Name of Psychiatrist: (Dr. [name]) Name of Therapist: [name] with [facility])  Education Status Is patient currently in school?: No Is the patient employed, unemployed or receiving disability?: Unemployed  Risk to self with the past 6 months Suicidal Ideation: Yes-Currently Present Has patient been a risk to self within the past 6 months prior to admission? : No Suicidal Intent: No Has patient had any suicidal intent within the past 6 months prior to admission? : No Is patient at risk for suicide?: Yes Suicidal Plan?: No Has patient had any suicidal plan within the past 6 months prior to admission? : No Access to Means: No What has been your use of drugs/alcohol within the last 12 months?: (denied) Previous Attempts/Gestures: No How many times?: 0 Other Self Harm Risks: (none) Triggers for Past Attempts: (n/a) Intentional Self Injurious Behavior: None Family Suicide History: No Recent stressful life event(s): (manic) Persecutory voices/beliefs?: No Depression: No Depression Symptoms: (n/a) Substance abuse history and/or treatment for substance abuse?: No Suicide prevention information given to non-admitted patients: Not applicable  Risk to Others within the past 6 months Homicidal Ideation: No Does patient have any lifetime risk of violence toward others beyond the six months prior  to admission? : No Thoughts of Harm to Others: No Current Homicidal Intent: No Current Homicidal Plan: No Access to Homicidal Means: No Identified Victim: (n/a) History of harm to others?: No Assessment of Violence: None Noted Violent Behavior Description: (none) Does patient have access to weapons?: No Criminal Charges Pending?: No Does patient have a court date: No Is patient on probation?: No  Psychosis Hallucinations: None noted Delusions: Unspecified  Mental Status Report Appearance/Hygiene: Unremarkable Eye Contact: Good Motor Activity: Freedom of movement, Hyperactivity Speech: Pressured Level of Consciousness: Alert Mood: Anxious Affect: Anxious Anxiety Level: Moderate Thought Processes: Coherent, Flight of Ideas Judgement: Impaired Orientation: Person, Place, Time, Situation Obsessive Compulsive Thoughts/Behaviors: Unable to Assess  Cognitive Functioning Concentration: Normal Memory: Recent Intact Is patient IDD: No Insight: Poor Impulse Control: Poor Appetite: Good Have you had any weight changes? : No Change Sleep: No Change Total Hours of Sleep: (8) Vegetative Symptoms: None  ADLScreening [facility] Assessment Services) Patient's cognitive ability adequate to safely complete daily activities?: Yes Patient able to express need for assistance with ADLs?: Yes Independently performs ADLs?: Yes (appropriate for developmental age)  Prior Inpatient Therapy Prior Inpatient Therapy: Yes Prior Therapy Dates: (03/27/19) Prior Therapy Facilty/Provider(s): ([facility]) Reason for Treatment: (mental illness)  Prior Outpatient Therapy Prior Outpatient Therapy: Yes Prior Therapy Dates: (present) Prior Therapy Facilty/Provider(s): (Dr. [name]) Reason for Treatment: (schizoaffective disorder) Does patient have an ACCT team?:  No Does patient have Intensive In-House Services?  : No Does patient have [facility] services? : No Does patient have P4CC services?: No  ADL  Screening (condition at time of admission) Patient's cognitive ability adequate to safely complete daily activities?: Yes Patient able to express need for assistance with ADLs?: Yes Independently performs ADLs?: Yes (appropriate for developmental age)  Disposition:  Disposition Initial Assessment Completed for this Encounter: Yes Disposition of Patient: Admit  Renaye Rakers, NP, patient meets inpatient criteria. [facility], accepted patient to [facility].   On Site Evaluation by:   Reviewed with Physician:    Burnetta Sabin 10/14/2019 10:03 PM

## 2019-10-14 NOTE — Progress Notes (Signed)
Stacy Poole is a 29 y.o. female Voluntary admitted for suicide ideation and mania. Pt present hyper verbal, disorganized thoughts. Pt stated her mother triggered her PTSD by forcing her to take anti-abuse medication for alcohol use. Pt denies SI/HI at this time, safe on the unit. Will continue to monitor.

## 2019-10-14 NOTE — H&P (Signed)
Psychiatric Admission Assessment Adult  Patient Identification: Stacy Poole MRN:  [id number] Date of Evaluation:  10/15/2019 Chief Complaint: Mania Principal Diagnosis: Schizoaffective disorder (HCC) Diagnosis:  Principal Problem:   Schizoaffective disorder (HCC)  History of Present Illness:   Stacy Poole is an 29 y.o. female who presents voluntarily as a walk-in with complaints of SI and mania. Pt reports that she had 7 shots of alcohol and her mother pressured her to take Antabuse which triggered her PTSD symptoms and caused her to be paranoid and suicidal. Pt presents with flight of ideas and pressured speech. 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 Pt denies current SI, HI and AVH. She denies Self harm. She denies depressive symptoms though she wrote on the check in sheet that she felt guilty, worthless, isolative and anxious. 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 She was last inpatient at [facility] 03/2019 and assessed 10/06/19. She sees Dr [name] for med management and [name] at [facility] for outpatient therapy. Pt sates she does not feel safe going home because her mother has kicked her out and is triggering her PTSD.   During evaluation pt is sitting; she is alert/oriented x 4;  cooperative; and mood is anxious congruent with affect. Pt has pressured speech at an increased volume, and pace; with good eye contact. Her thought process is coherent and relevant; There is no indication that she is currently responding to internal/external stimuli.   Associated Signs/Symptoms: Depression Symptoms:  depressed mood, NA (Hypo) Manic Symptoms:   Delusions, Impulsivity, Anxiety Symptoms:  Excessive Worry, Psychotic Symptoms:  Paranoia, PTSD Symptoms: Hypervigilance:  Yes Total Time spent with patient: 30 minutes  Past Psychiatric History: Yes  Is the patient at risk to self? Yes.    Has the patient been a risk to self in the past 6 months? No.  Has the patient been a risk to self within the distant past? No.  Is the patient a risk to others? No.  Has the patient been a risk to others in the past 6 months? No.  Has the patient been a risk to others within the distant past? No.   Prior Inpatient Therapy: Prior Inpatient Therapy: Yes Prior Therapy Dates: (03/27/19) Prior Therapy Facilty/Provider(s): [facility]) Reason for Treatment: (mental illness) Prior Outpatient Therapy: Prior Outpatient Therapy: Yes Prior Therapy Dates: (present) Prior Therapy Facilty/Provider(s): (Dr. [name]) Reason for Treatment: (schizoaffective disorder) Does patient have an ACCT team?: No Does patient have Intensive In-House Services?  : No Does patient have [facility] services? : No Does patient have P4CC services?: No  Alcohol Screening: 1. How often do you have a drink containing alcohol?: Monthly or less 2. How many drinks containing alcohol do you have on a typical day when you are drinking?: 3 or 4 3. How often do you have six or more drinks on one occasion?: Less than monthly AUDIT-C Score: 3 4. How often during the last year have you found that you were not able to stop drinking once you had started?: Never 5. How often during the last year have you failed to do what was normally expected from you becasue of drinking?: Never 6.  How often during the last year have you needed a first drink in the morning to get yourself going after a heavy drinking session?: Never 7. How often during the last year have you had a feeling of guilt of remorse after drinking?: Never 8. How often during the last year have you been unable to remember what happened  the night before because you had been drinking?: Never 9. Have you or someone else been injured as a result of your drinking?: No 10. Has a relative or friend or a doctor or another health worker been concerned about your drinking or suggested you cut down?: No Alcohol Use Disorder Identification Test Final Score (AUDIT): 3 Substance Abuse History in the last 12 months:  Yes.   Consequences of Substance Abuse: Family Consequences:  Fights with mother Previous Psychotropic Medications: Yes  Psychological Evaluations: Yes  Past Medical History:  Past Medical History:  Diagnosis Date  . Acute ear infection   . Anxiety   . Arachnoid cyst   . Fifth disease   . Insomnia   . Mental disorder   . Mononucleosis   . PTSD (post-traumatic stress disorder)   . Substance abuse (HCC)    History reviewed. No pertinent surgical history. Family History: History reviewed. No pertinent family history. Family Psychiatric  History: Unknown Tobacco Screening:   Social History:  Social History   Substance and Sexual Activity  Alcohol Use Yes  . Alcohol/week: 10.0 standard drinks  . Types: 10 Shots of liquor per week   Comment: daily     Social History   Substance and Sexual Activity  Drug Use Yes  . Types: Codeine, Benzodiazepines, Other-see comments    Additional Social History: Marital status: Single    Pain Medications: See MAR Prescriptions: See MAR Over the Counter: See MAR History of alcohol / drug use?: No history of alcohol / drug abuse                    Allergies:   Allergies  Allergen Reactions  . Tetracyclines & Related Other (See Comments)    Ear popping, couldn't move neck/back, and had blind spots/double vision   Lab Results:  Results for orders placed or performed during the hospital encounter of 10/14/19 (from the past 48 hour(s))  Respiratory Panel by RT PCR (Flu A&B, Covid) - Nasopharyngeal Swab     Status: None   Collection Time: 10/14/19  9:35 PM    Specimen: Nasopharyngeal Swab  Result Value Ref Range   SARS Coronavirus 2 by RT PCR NEGATIVE NEGATIVE    Comment: (NOTE) SARS-CoV-2 target nucleic acids are NOT DETECTED. The SARS-CoV-2 RNA is generally detectable in upper respiratoy specimens during the acute phase of infection. The lowest concentration of SARS-CoV-2 viral copies this assay can detect is 131 copies/mL. A negative result does not preclude SARS-Cov-2 infection and should not be used as the sole basis for treatment or other patient management decisions. A negative result may occur with  improper specimen collection/handling, submission of specimen other than nasopharyngeal swab, presence of viral mutation(s) within the areas targeted by this assay, and inadequate number of viral copies (<131 copies/mL). A negative result must be combined with clinical observations, patient history, and epidemiological information. The expected result is Negative. Fact Sheet for Patients:  https://www.moore.com/ Fact Sheet for Healthcare Providers:  https://www.young.biz/ This test is not yet ap proved or cleared by the [place] FDA and  has been authorized for detection and/or diagnosis of SARS-CoV-2 by FDA  under an Emergency Use Authorization (EUA). This EUA will remain  in effect (meaning this test can be used) for the duration of the COVID-19 declaration under Section 564(b)(1) of the Act, 21 U.S.C. section 360bbb-3(b)(1), unless the authorization is terminated or revoked sooner.    Influenza A by PCR NEGATIVE NEGATIVE   Influenza B by PCR NEGATIVE NEGATIVE    Comment: (NOTE) The Xpert Xpress SARS-CoV-2/FLU/RSV assay is intended as an aid in  the diagnosis of influenza from Nasopharyngeal swab specimens and  should not be used as a sole basis for treatment. Nasal washings and  aspirates are unacceptable for Xpert Xpress SARS-CoV-2/FLU/RSV  testing. Fact Sheet for  Patients: https://www.moore.com/ Fact Sheet for Healthcare Providers: https://www.young.biz/ This test is not yet approved or cleared by the [place] FDA and  has been authorized for detection and/or diagnosis of SARS-CoV-2 by  FDA under an Emergency Use Authorization (EUA). This EUA will remain  in effect (meaning this test can be used) for the duration of the  Covid-19 declaration under Section 564(b)(1) of the Act, 21  U.S.C. section 360bbb-3(b)(1), unless the authorization is  terminated or revoked. Performed at [facility], [address], [place], [place] [address]     Blood Alcohol level:  Lab Results  Component Value Date   ETH <10 10/06/2019   ETH 301 (HH) 02/21/2019    Metabolic Disorder Labs:  Lab Results  Component Value Date   HGBA1C 4.9 03/28/2019   MPG 93.93 03/28/2019   MPG 99.67 05/07/2018   No results found for: PROLACTIN Lab Results  Component Value Date   CHOL 179 03/28/2019   TRIG 32 03/28/2019   HDL 95 03/28/2019   CHOLHDL 1.9 03/28/2019   VLDL 6 03/28/2019   LDLCALC 78 03/28/2019   LDLCALC 66 05/07/2018    Current Medications: Current Facility-Administered Medications  Medication Dose Route Frequency Provider Last Rate Last Admin  . acetaminophen (TYLENOL) tablet 650 mg  650 mg Oral Q6H PRN Anaiya Wisinski C, NP      . alum & mag hydroxide-simeth (MAALOX/MYLANTA) 200-200-20 MG/5ML suspension 30 mL  30 mL Oral Q4H PRN Mauriana Dann C, NP      . hydrOXYzine (ATARAX/VISTARIL) tablet 25 mg  25 mg Oral TID PRN Emelda Kohlbeck C, NP   25 mg at 10/15/19 0018  . magnesium hydroxide (MILK OF MAGNESIA) suspension 30 mL  30 mL Oral Daily PRN Rubyann Lingle C, NP      . nicotine (NICODERM CQ - dosed in mg/24 hours) patch 14 mg  14 mg Transdermal Daily Demani Weyrauch C, NP      . traZODone (DESYREL) tablet 50 mg  50 mg Oral QHS PRN Howard Bunte C, NP   50 mg at 10/15/19 0013   PTA  Medications: Medications Prior to Admission  Medication Sig Dispense Refill Last Dose  . amantadine (SYMMETREL) 100 MG capsule Take 100 mg by mouth every evening.      . benztropine (COGENTIN) 1 MG tablet Take 1 tablet (1 mg total) by mouth 2 (two) times daily. (Patient not taking: Reported on 10/06/2019) 60 tablet 2   . disulfiram (ANTABUSE) 250 MG tablet Take 250 mg by mouth daily.     Marland Kitchen FLUoxetine (PROZAC) 20 MG capsule Take 1 capsule (20 mg total) by mouth daily. (Patient taking differently: Take 20-40 mg by mouth See admin instructions. 40 MG in the morning and 20 MG in the evening) 30 capsule 2   . lamoTRIgine (LAMICTAL) 25 MG  tablet Take 2 tablets (50 mg total) by mouth 2 (two) times daily. (Patient taking differently: Take 25-50 mg by mouth See admin instructions. 25 MG in the morning and 50 MG in the evening) 120 tablet 2   . naltrexone (DEPADE) 50 MG tablet Take 1 tablet (50 mg total) by mouth daily. (Patient taking differently: Take 50 mg by mouth every evening. ) 90 tablet 1   . perphenazine (TRILAFON) 4 MG tablet Take 4 mg by mouth at bedtime.       Musculoskeletal: Strength & Muscle Tone: within normal limits Gait & Station: normal Patient leans: N/A  Psychiatric Specialty Exam: Physical Exam  Constitutional: She is oriented to person, place, and time. She appears well-developed and well-nourished.  HENT:  Head: Normocephalic.  Eyes: Pupils are equal, round, and reactive to light.  Respiratory: Effort normal.  Musculoskeletal:        General: Normal range of motion.     Cervical back: Normal range of motion.  Neurological: She is alert and oriented to person, place, and time.  Skin: Skin is warm and dry.  Psychiatric: She has a normal mood and affect. Her speech is normal and behavior is normal. Thought content is paranoid and delusional. Cognition and memory are normal. She expresses impulsivity.    Review of Systems  Psychiatric/Behavioral: Negative for agitation,  confusion, dysphoric mood, hallucinations and suicidal ideas. The patient is nervous/anxious.   All other systems reviewed and are negative.   Blood pressure 111/83, pulse (!) 106, temperature 99.2 F (37.3 C), temperature source Oral, resp. rate 18, SpO2 100 %.There is no height or weight on file to calculate BMI.  General Appearance: Casual  Eye Contact:  Good  Speech:  Pressured  Volume:  Increased  Mood:  Anxious  Affect:  Congruent  Thought Process:  Goal Directed and Descriptions of Associations: Circumstantial  Orientation:  Full (Time, Place, and Person)  Thought Content:  Delusions and Ideas of Reference:   Delusions  Suicidal Thoughts:  No  Homicidal Thoughts:  No  Memory:  Recent;   Good  Judgement:  Fair  Insight:  Fair  Psychomotor Activity:  Restlessness  Concentration:  Concentration: Fair  Recall:  Good  Fund of Knowledge:  Good  Language:  Good  Akathisia:  No  Handed:  Right  AIMS (if indicated):     Assets:  Communication Skills Desire for Improvement Housing Social Support  ADL's:  Intact  Cognition:  WNL  Sleep:      Disposition: Recommend psychiatric Inpatient admission when medically cleared. Supportive therapy provided about ongoing stressors.   Treatment Plan Summary: Daily contact with patient to assess and evaluate symptoms and progress in treatment and Medication management  Observation Level/Precautions:  15 minute checks  Laboratory:  Chemistry Profile UDS  Psychotherapy:    Medications:    Consultations:    Discharge Concerns:    Estimated LOS:  Other:     Physician Treatment Plan for Primary Diagnosis: Schizoaffective disorder ([place]) Long Term Goal(s): Improvement in symptoms so as ready for discharge  Short Term Goals: Ability to identify changes in lifestyle to reduce recurrence of condition will improve, Ability to demonstrate self-control will improve, Ability to identify and develop effective coping behaviors will improve,  Ability to maintain clinical measurements within normal limits will improve and Ability to identify triggers associated with substance abuse/mental health issues will improve  Physician Treatment Plan for Secondary Diagnosis: Principal Problem:   Schizoaffective disorder ([place])  Long Term Goal(s): Improvement in  symptoms so as ready for discharge  Short Term Goals: Ability to identify changes in lifestyle to reduce recurrence of condition will improve, Ability to demonstrate self-control will improve, Ability to identify and develop effective coping behaviors will improve, Ability to maintain clinical measurements within normal limits will improve and Ability to identify triggers associated with substance abuse/mental health issues will improve  I certify that inpatient services furnished can reasonably be expected to improve the patient's condition.    Wandra Arthurs, NP 2/10/20211:37 AM

## 2019-10-15 ENCOUNTER — Encounter (HOSPITAL_COMMUNITY): Payer: Self-pay | Admitting: Behavioral Health

## 2019-10-15 DIAGNOSIS — F250 Schizoaffective disorder, bipolar type: Secondary | ICD-10-CM

## 2019-10-15 LAB — URINALYSIS, COMPLETE (UACMP) WITH MICROSCOPIC
Bacteria, UA: NONE SEEN
Bilirubin Urine: NEGATIVE
Glucose, UA: NEGATIVE mg/dL
Hgb urine dipstick: NEGATIVE
Ketones, ur: NEGATIVE mg/dL
Leukocytes,Ua: NEGATIVE
Nitrite: NEGATIVE
Protein, ur: NEGATIVE mg/dL
Specific Gravity, Urine: 1.013 (ref 1.005–1.030)
pH: 8 (ref 5.0–8.0)

## 2019-10-15 LAB — COMPREHENSIVE METABOLIC PANEL
ALT: 17 U/L (ref 0–44)
AST: 23 U/L (ref 15–41)
Albumin: 3.4 g/dL — ABNORMAL LOW (ref 3.5–5.0)
Alkaline Phosphatase: 35 U/L — ABNORMAL LOW (ref 38–126)
Anion gap: 5 (ref 5–15)
BUN: 11 mg/dL (ref 6–20)
CO2: 29 mmol/L (ref 22–32)
Calcium: 8.7 mg/dL — ABNORMAL LOW (ref 8.9–10.3)
Chloride: 103 mmol/L (ref 98–111)
Creatinine, Ser: 0.7 mg/dL (ref 0.44–1.00)
GFR calc Af Amer: >60 mL/min (ref >60)
GFR calc non Af Amer: >60 mL/min (ref >60)
Glucose, Bld: 84 mg/dL (ref 70–99)
Potassium: 4 mmol/L (ref 3.5–5.1)
Sodium: 137 mmol/L (ref 135–145)
Total Bilirubin: 0.3 mg/dL (ref 0.3–1.2)
Total Protein: 5.8 g/dL — ABNORMAL LOW (ref 6.5–8.1)

## 2019-10-15 LAB — RAPID URINE DRUG SCREEN, HOSP PERFORMED
Amphetamines: NOT DETECTED
Barbiturates: NOT DETECTED
Benzodiazepines: NOT DETECTED
Cocaine: NOT DETECTED
Opiates: NOT DETECTED
Tetrahydrocannabinol: NOT DETECTED

## 2019-10-15 LAB — HEPATIC FUNCTION PANEL
ALT: 17 U/L (ref 0–44)
AST: 23 U/L (ref 15–41)
Albumin: 3.3 g/dL — ABNORMAL LOW (ref 3.5–5.0)
Alkaline Phosphatase: 37 U/L — ABNORMAL LOW (ref 38–126)
Bilirubin, Direct: <0.1 mg/dL (ref 0.0–0.2)
Total Bilirubin: 0.4 mg/dL (ref 0.3–1.2)
Total Protein: 5.6 g/dL — ABNORMAL LOW (ref 6.5–8.1)

## 2019-10-15 LAB — LIPID PANEL
Cholesterol: 158 mg/dL (ref 0–200)
HDL: 76 mg/dL (ref >40)
LDL Cholesterol: 71 mg/dL (ref 0–99)
Total CHOL/HDL Ratio: 2.1 RATIO
Triglycerides: 53 mg/dL (ref <150)
VLDL: 11 mg/dL (ref 0–40)

## 2019-10-15 LAB — HEMOGLOBIN A1C
Hgb A1c MFr Bld: 5.1 % (ref 4.8–5.6)
Mean Plasma Glucose: 99.67 mg/dL

## 2019-10-15 LAB — MAGNESIUM: Magnesium: 2 mg/dL (ref 1.7–2.4)

## 2019-10-15 LAB — ETHANOL: Alcohol, Ethyl (B): <10 mg/dL (ref <10)

## 2019-10-15 LAB — TSH: TSH: 1.096 u[IU]/mL (ref 0.350–4.500)

## 2019-10-15 MED ORDER — CLONAZEPAM 1 MG PO TABS: 2.0000 mg | ORAL_TABLET | Freq: Once | ORAL | Status: DC

## 2019-10-15 MED ORDER — TEMAZEPAM 30 MG PO CAPS
30.0000 mg | ORAL_CAPSULE | Freq: Every day | ORAL | Status: DC
  Administered 2019-10-15 – 2019-10-16 (×2): 30 mg via ORAL
  Filled 2019-10-15 (×2): qty 1

## 2019-10-15 MED ORDER — BENZTROPINE MESYLATE 0.5 MG PO TABS
0.5000 mg | ORAL_TABLET | Freq: Two times a day (BID) | ORAL | Status: DC
  Administered 2019-10-15 – 2019-10-16 (×3): 0.5 mg via ORAL
  Filled 2019-10-15 (×5): qty 1

## 2019-10-15 MED ORDER — ZIPRASIDONE MESYLATE 20 MG IM SOLR: 20.0000 mg | Freq: Once | INTRAMUSCULAR | Status: AC

## 2019-10-15 MED ORDER — LORAZEPAM 2 MG/ML IJ SOLN: 2.0000 mg | Freq: Once | INTRAMUSCULAR | Status: AC

## 2019-10-15 MED ORDER — ZIPRASIDONE MESYLATE 20 MG IM SOLR
INTRAMUSCULAR | Status: AC
  Administered 2019-10-15: 20 mg via INTRAMUSCULAR
  Filled 2019-10-15: qty 20

## 2019-10-15 MED ORDER — PERPHENAZINE 4 MG PO TABS
4.0000 mg | ORAL_TABLET | Freq: Once | ORAL | Status: AC
  Administered 2019-10-15: 02:00:00 4 mg via ORAL
  Filled 2019-10-15 (×2): qty 1

## 2019-10-15 MED ORDER — LITHIUM CARBONATE ER 450 MG PO TBCR
450.0000 mg | EXTENDED_RELEASE_TABLET | Freq: Two times a day (BID) | ORAL | Status: DC
  Filled 2019-10-15 (×5): qty 1

## 2019-10-15 MED ORDER — PERPHENAZINE 8 MG PO TABS
8.0000 mg | ORAL_TABLET | Freq: Three times a day (TID) | ORAL | Status: DC
  Administered 2019-10-15 – 2019-10-18 (×8): 8 mg via ORAL
  Filled 2019-10-15 (×16): qty 1

## 2019-10-15 MED ORDER — LORAZEPAM 2 MG/ML IJ SOLN
INTRAMUSCULAR | Status: AC
  Administered 2019-10-15: 09:00:00 2 mg via INTRAMUSCULAR
  Filled 2019-10-15: qty 1

## 2019-10-15 MED ORDER — NICOTINE 14 MG/24HR TD PT24
14.0000 mg | MEDICATED_PATCH | Freq: Every day | TRANSDERMAL | Status: DC
  Administered 2019-10-15 – 2019-10-24 (×10): 14 mg via TRANSDERMAL
  Filled 2019-10-15 (×13): qty 1

## 2019-10-15 NOTE — Tx Team (Signed)
Interdisciplinary Treatment and Diagnostic Plan Update  10/15/2019 Time of Session: 10:40am  [facility] HOLLEN MRN: [id number]  Principal Diagnosis: Schizoaffective disorder [facility])  Secondary Diagnoses: Principal Problem:   Schizoaffective disorder ([place])   Current Medications:  Current Facility-Administered Medications  Medication Dose Route Frequency Provider Last Rate Last Admin  . acetaminophen (TYLENOL) tablet 650 mg  650 mg Oral Q6H PRN Anike, Adaku C, NP      . alum & mag hydroxide-simeth (MAALOX/MYLANTA) 200-200-20 MG/5ML suspension 30 mL  30 mL Oral Q4H PRN Anike, Adaku C, NP      . benztropine (COGENTIN) tablet 0.5 mg  0.5 mg Oral BID Johnn Hai, MD      . hydrOXYzine (ATARAX/VISTARIL) tablet 25 mg  25 mg Oral TID PRN Anike, Adaku C, NP   25 mg at 10/15/19 0018  . lithium carbonate (ESKALITH) CR tablet 450 mg  450 mg Oral Q12H Johnn Hai, MD      . magnesium hydroxide (MILK OF MAGNESIA) suspension 30 mL  30 mL Oral Daily PRN Anike, Adaku C, NP      . nicotine (NICODERM CQ - dosed in mg/24 hours) patch 14 mg  14 mg Transdermal Daily Anike, Adaku C, NP   14 mg at 10/15/19 0745  . perphenazine (TRILAFON) tablet 8 mg  8 mg Oral TID Johnn Hai, MD      . temazepam (RESTORIL) capsule 30 mg  30 mg Oral QHS Johnn Hai, MD      . traZODone (DESYREL) tablet 50 mg  50 mg Oral QHS PRN Anike, Adaku C, NP   50 mg at 10/15/19 0013   PTA Medications: Medications Prior to Admission  Medication Sig Dispense Refill Last Dose  . benztropine (COGENTIN) 1 MG tablet Take 1 tablet (1 mg total) by mouth 2 (two) times daily. 60 tablet 2 Past Month at Unknown time  . FLUoxetine (PROZAC) 20 MG capsule Take 40 mg by mouth daily.     Marland Kitchen lamoTRIgine (LAMICTAL) 25 MG tablet Take 50 mg by mouth daily.     Marland Kitchen lamoTRIgine (LAMICTAL) 25 MG tablet Take 25 mg by mouth at bedtime.     Marland Kitchen perphenazine (TRILAFON) 4 MG tablet Take 4 mg by mouth daily.     Marland Kitchen perphenazine (TRILAFON) 4 MG tablet Take 8 mg by mouth  at bedtime.       Patient Stressors: Marital or family conflict Other: PTSD (exacerbated by mother's actions)  Patient Strengths: Ability for insight Average or above average intelligence Supportive family/friends  Treatment Modalities: Medication Management, Group therapy, Case management,  1 to 1 session with clinician, Psychoeducation, Recreational therapy.   Physician Treatment Plan for Primary Diagnosis: Schizoaffective disorder ([place]) Long Term Goal(s): Improvement in symptoms so as ready for discharge Improvement in symptoms so as ready for discharge   Short Term Goals: Ability to identify changes in lifestyle to reduce recurrence of condition will improve Ability to identify and develop effective coping behaviors will improve Compliance with prescribed medications will improve Ability to demonstrate self-control will improve Ability to maintain clinical measurements within normal limits will improve Compliance with prescribed medications will improve  Medication Management: Evaluate patient's response, side effects, and tolerance of medication regimen.  Therapeutic Interventions: 1 to 1 sessions, Unit Group sessions and Medication administration.  Evaluation of Outcomes: Not Met  Physician Treatment Plan for Secondary Diagnosis: Principal Problem:   Schizoaffective disorder ([place])  Long Term Goal(s): Improvement in symptoms so as ready for discharge Improvement in symptoms so as ready  for discharge   Short Term Goals: Ability to identify changes in lifestyle to reduce recurrence of condition will improve Ability to identify and develop effective coping behaviors will improve Compliance with prescribed medications will improve Ability to demonstrate self-control will improve Ability to maintain clinical measurements within normal limits will improve Compliance with prescribed medications will improve     Medication Management: Evaluate patient's response, side  effects, and tolerance of medication regimen.  Therapeutic Interventions: 1 to 1 sessions, Unit Group sessions and Medication administration.  Evaluation of Outcomes: Not Met   RN Treatment Plan for Primary Diagnosis: Schizoaffective disorder ([place]) Long Term Goal(s): Knowledge of disease and therapeutic regimen to maintain health will improve  Short Term Goals: Ability to participate in decision making will improve, Ability to verbalize feelings will improve, Ability to disclose and discuss suicidal ideas, Ability to identify and develop effective coping behaviors will improve and Compliance with prescribed medications will improve  Medication Management: RN will administer medications as ordered by provider, will assess and evaluate patient's response and provide education to patient for prescribed medication. RN will report any adverse and/or side effects to prescribing provider.  Therapeutic Interventions: 1 on 1 counseling sessions, Psychoeducation, Medication administration, Evaluate responses to treatment, Monitor vital signs and CBGs as ordered, Perform/monitor CIWA, COWS, AIMS and Fall Risk screenings as ordered, Perform wound care treatments as ordered.  Evaluation of Outcomes: Not Met   LCSW Treatment Plan for Primary Diagnosis: Schizoaffective disorder ([place]) Long Term Goal(s): Safe transition to appropriate next level of care at discharge, Engage patient in therapeutic group addressing interpersonal concerns.  Short Term Goals: Engage patient in aftercare planning with referrals and resources and Increase skills for wellness and recovery  Therapeutic Interventions: Assess for all discharge needs, 1 to 1 time with Social worker, Explore available resources and support systems, Assess for adequacy in community support network, Educate family and significant other(s) on suicide prevention, Complete Psychosocial Assessment, Interpersonal group therapy.  Evaluation of Outcomes: Not  Met   Progress in Treatment: Attending groups: No. Participating in groups: No. Taking medication as prescribed: Yes. Toleration medication: Yes. Family/Significant other contact made: No, will contact:  if given consent  Patient understands diagnosis: No. Discussing patient identified problems/goals with staff: Yes. Medical problems stabilized or resolved: Yes. Denies suicidal/homicidal ideation: Yes. Issues/concerns per patient self-inventory: No. Other:   New problem(s) identified: No, Describe:  none   New Short Term/Long Term Goal(s): Medication stabilization, elimination of SI thoughts, and development of a comprehensive mental wellness plan.   Patient Goals:  Pt was asleep and would not wake up when called   Discharge Plan or Barriers: CSW will continue to follow up for appropriate referrals and possible discharge planning  Reason for Continuation of Hospitalization: Delusions  Hallucinations Medication stabilization  Estimated Length of Stay: 3-5 days   Attendees: Patient: Stacy Poole 10/15/2019   Physician:  10/15/2019   Nursing:  10/15/2019   RN Care Manager: 10/15/2019   Social Worker: Ardelle Anton, LCSW 10/15/2019   Recreational Therapist:  10/15/2019   Other: Ovidio Kin, MSW intern  10/15/2019   Other:  10/15/2019   Other: 10/15/2019     Scribe for Treatment Team: Billey Chang, Student-Social Work 10/15/2019 10:59 AM

## 2019-10-15 NOTE — Progress Notes (Signed)
Pt given Trilafon 4 mg. Pt also asked for Cogentin to accompany it. Pt worried about tardive dyskinesia. According to pt PTA meds, she is not taking Cogentin (per mother). Pt states that this is false and that she is taking this medication. Pt informed that her attending psychiatrist will have to order that in the morning.

## 2019-10-15 NOTE — Progress Notes (Signed)
Pt agitated and says she is "schizoaffective and need my medicine." This Clinical research associate and charge nurse spoke with provider and given one-time dose of pts antipsychotic, perphenazine (Trilafon) 4 mg. Will administer when available in pyxis.
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
## 2019-10-15 NOTE — Progress Notes (Signed)
Recreation Therapy Notes  INPATIENT RECREATION THERAPY ASSESSMENT  Patient Details Name: Stacy Poole MRN: [id number] DOB: 10/24/1990 Today's Date: 10/15/2019       Information Obtained From: Patient  Able to Participate in Assessment/Interview: Yes  Patient Presentation: Alert  Reason for Admission (Per Patient): Other (Comments)(Pt stated her PTSD got triggered.)  Patient Stressors: Family, Relationship, Work, Other (Comment)(Finances)  Coping Skills:   Journal, Sports, TV, Arguments, Music, Exercise, Meditate, Deep Breathing, Talk, Art, Avoidance, Read  Leisure Interests (2+):  Sports - Other (Comment), Sports - Exercise (Comment), Crafts - Other (Comment), Individual - Other (Comment), Art - Draw, Art - Paint(Soccer, Pilates, Yoga, Cycling, Jewlery making, [employer] stuff)  Frequency of Recreation/Participation: Other (Comment)(Daily)  Awareness of Community Resources:  Yes  Community Resources:  Library, [employer]  Current Use: Yes  If no, Barriers?:    Expressed Interest in [employer] Information: No  [employer] of Residence:  [place]  Patient 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

## 2019-10-15 NOTE — Progress Notes (Signed)
Recreation Therapy Notes  Date: 2.10.21 Time: 1000 Location: 500 Hall Dayroom  Group Topic: Communication  Goal Area(s) Addresses:  Patient will effectively communicate with peers in group.  Patient will verbalize benefit of healthy communication.  Intervention: Group Discussion  Activity: Check-In.  Patients were to discuss any concerns or positive achievements they may have.  Education: Communication, Discharge Planning  Education Outcome: Acknowledges understanding/In group clarification offered/Needs additional education.   Clinical Observations/Feedback: Pt did not attend group.    Ronel Rodeheaver, LRT/CTRS         Marra Fraga A 10/15/2019 11:48 AM 

## 2019-10-15 NOTE — Progress Notes (Signed)
   10/15/19 2200  Psych Admission Type (Psych Patients Only)  Admission Status Voluntary  Psychosocial Assessment  Patient Complaints Anxiety;Restlessness  Eye Contact Intense  Facial Expression Angry  Affect Angry  Speech Rapid;Pressured;Tangential  Interaction Demanding;Dominating;Needy  Motor Activity Restless;Hyperactive;Fidgety  Appearance/Hygiene Unremarkable  Behavior Characteristics Hyperactive  Mood Labile;Anxious  Aggressive Behavior  Targets Family  Type of Behavior Verbal;Threatening  Effect No apparent injury  Thought Process  Coherency Disorganized;Flight of ideas;Tangential  Content Delusions;Religiosity  Delusions Religious;Paranoid  Perception Depersonalization  Hallucination None reported or observed  Judgment Impaired  Confusion Mild  Danger to Self  Current suicidal ideation? Denies  Danger to Others  Danger to Others None reported or observed
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
## 2019-10-15 NOTE — H&P (Signed)
Psychiatric Admission Assessment Adult  Patient Identification: Stacy Poole MRN:  [id number] Date of Evaluation:  10/15/2019 Chief Complaint:  Schizoaffective disorder [employer]) [F25.9] Principal Diagnosis: Schizoaffective disorder (HCC) Diagnosis:  Principal Problem:   Schizoaffective disorder (HCC)  History of Present Illness:   This is a repeat admission for Stacy Poole, well-known to the healthcare system, a 29 year old patient with a schizoaffective/bipolar type condition, last hospitalized in July 2020. She has been chronically difficult to manage due to her tendency to micromanage her own case, and her tendency towards volatility and inappropriate rages when she is manic.  She presented on 2/9 with multiple symptoms indicating an exacerbation in her underlying psychotic disorder.  She was described as hyperverbal, displaying flight of ideas, making random and delusional statements which continue today.  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  She states "my mother triggered my PTSD, she wanted to put me on Antabuse after 7 shots of whiskey"  When asked about treatment options the patient states "Zyprexa is good, Trilafon is good,"  Clearly needed inpatient stabilization.  According to the assessment team notes  Stacy Poole is an 29 y.o. female presenting as a walk-in to [facility] with SI and mania. Patient presents as hyper verbal and with flight of ideas. Patient denied SI plan. Patient reported onset of SI was today and triggered by her mother pressuring her to take Anibuse medicine, which is for alcoholics. Patient reported her mother triggered her PTSD. Patient then stated, "my mother is white and I don't trust  white people". 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 Patient reported drinking alcohol from [place] today at 3pm. Patient denied depressive symptoms, however checked following on her intake form, guilt, worthlessness, wanting to be alone and increased anxiety. Patient reported "my memory is fragmented due to many concussions". 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   Patient was last inpatient at [facility] 03/2019 and 05/2018. Patient was also assessed on 10/06/19, patient did not meet inpatient criteria at that time and was instructed to follow-up with her psychiatrist Dr. Jannifer Poole for medication management. Patient reported medications are working but wants to talk to Dr. Jannifer Poole about a medication adjustment. Patient is compliant with medication. Patient reported missing her appointment on last week. Patient is currently seeing [name] at [facility] for outpatient therapy.    Associated Signs/Symptoms: Depression Symptoms:  insomnia, psychomotor agitation, difficulty concentrating, (Hypo) Manic Symptoms:  Delusions, Anxiety Symptoms:  Not particularly anxious Psychotic Symptoms:  Delusions, Paranoia, PTSD Symptoms: Had a traumatic exposure:  Possible but never elaborates on my exams Total Time spent with patient: 45 minutes  Past Psychiatric History: Extensive  Is the patient at risk to self? Yes.    Has the patient been a risk to self in the past 6 months? No.  Has the patient been a risk to self within the distant past? Yes.    Is the patient a risk to others? No.  Has the patient been a risk to others  in the past 6 months? No.  Has the patient been a risk to others within the distant  past? No.   Prior Inpatient Therapy: Prior Inpatient Therapy: Yes Prior Therapy Dates: (03/27/19) Prior Therapy Facilty/Provider(s): [facility]) Reason for Treatment: (mental illness) Prior Outpatient Therapy: Prior Outpatient Therapy: Yes Prior Therapy Dates: (present) Prior Therapy Facilty/Provider(s): (Dr. Jannifer Poole) Reason for Treatment: (schizoaffective disorder) Does patient have an ACCT team?: No Does patient have Intensive In-House Services?  : No Does patient have [facility] services? : No Does patient have P4CC services?: No  Alcohol Screening: 1. How often do you have a drink containing alcohol?: Monthly or less 2. How many drinks containing alcohol do you have on a typical day when you are drinking?: 3 or 4 3. How often do you have six or more drinks on one occasion?: Less than monthly AUDIT-C Score: 3 4. How often during the last year have you found that you were not able to stop drinking once you had started?: Never 5. How often during the last year have you failed to do what was normally expected from you becasue of drinking?: Never 6. How often during the last year have you needed a first drink in the morning to get yourself going after a heavy drinking session?: Never 7. How often during the last year have you had a feeling of guilt of remorse after drinking?: Never 8. How often during the last year have you been unable to remember what happened the night before because you had been drinking?: Never 9. Have you or someone else been injured as a result of your drinking?: No 10. Has a relative or friend or a doctor or another health worker been concerned about your drinking or suggested you cut down?: No Alcohol Use Disorder Identification Test Final Score (AUDIT): 3 Substance Abuse History in the last 12 months:  Yes.   Consequences of Substance Abuse: NA Previous Psychotropic Medications: Yes  Psychological Evaluations: No  Past Medical History:  Past Medical History:   Diagnosis Date  . Acute ear infection   . Anxiety   . Arachnoid cyst   . Fifth disease   . Insomnia   . Mental disorder   . Mononucleosis   . PTSD (post-traumatic stress disorder)   . Substance abuse (HCC)    History reviewed. No pertinent surgical history. Family History: History reviewed. No pertinent family history. Family Psychiatric  History: Unknown to patient Tobacco Screening: Have you used any form of tobacco in the last 30 days? (Cigarettes, Smokeless Tobacco, Cigars, and/or Pipes): Yes Tobacco use, Select all that apply: 5 or more cigarettes per day Are you interested in Tobacco Cessation Medications?: Yes, will notify MD for an order Counseled patient on smoking cessation including recognizing danger situations, developing coping skills and basic information about quitting provided: Refused/Declined practical counseling Social History:  Social History   Substance and Sexual Activity  Alcohol Use Yes  . Alcohol/week: 10.0 standard drinks  . Types: 10 Shots of liquor per week   Comment: daily     Social History   Substance and Sexual Activity  Drug Use Yes  . Types: Codeine, Benzodiazepines, Other-see comments    Additional Social History: Marital status: Single    Pain Medications: See MAR Prescriptions: See MAR Over the Counter: See MAR History of alcohol / drug use?: No history of alcohol / drug abuse                    Allergies:  Allergies  Allergen Reactions  . Tetracyclines & Related Other (See Comments)    Ear popping, couldn't move neck/back, and had blind spots/double vision   Lab Results:  Results for orders placed or performed during the hospital encounter of 10/14/19 (from the past 48 hour(s))  Respiratory Panel by RT PCR (Flu A&B, Covid) - Nasopharyngeal Swab     Status: None   Collection Time: 10/14/19  9:35 PM   Specimen: Nasopharyngeal Swab  Result Value Ref Range   SARS Coronavirus 2 by RT PCR NEGATIVE NEGATIVE    Comment:  (NOTE) SARS-CoV-2 target nucleic acids are NOT DETECTED. The SARS-CoV-2 RNA is generally detectable in upper respiratoy specimens during the acute phase of infection. The lowest concentration of SARS-CoV-2 viral copies this assay can detect is 131 copies/mL. A negative result does not preclude SARS-Cov-2 infection and should not be used as the sole basis for treatment or other patient management decisions. A negative result may occur with  improper specimen collection/handling, submission of specimen other than nasopharyngeal swab, presence of viral mutation(s) within the areas targeted by this assay, and inadequate number of viral copies (<131 copies/mL). A negative result must be combined with clinical observations, patient history, and epidemiological information. The expected result is Negative. Fact Sheet for Patients:  https://www.moore.com/ Fact Sheet for Healthcare Providers:  https://www.young.biz/ This test is not yet ap proved or cleared by the [place] FDA and  has been authorized for detection and/or diagnosis of SARS-CoV-2 by FDA under an Emergency Use Authorization (EUA). This EUA will remain  in effect (meaning this test can be used) for the duration of the COVID-19 declaration under Section 564(b)(1) of the Act, 21 U.S.C. section 360bbb-3(b)(1), unless the authorization is terminated or revoked sooner.    Influenza A by PCR NEGATIVE NEGATIVE   Influenza B by PCR NEGATIVE NEGATIVE    Comment: (NOTE) The Xpert Xpress SARS-CoV-2/FLU/RSV assay is intended as an aid in  the diagnosis of influenza from Nasopharyngeal swab specimens and  should not be used as a sole basis for treatment. Nasal washings and  aspirates are unacceptable for Xpert Xpress SARS-CoV-2/FLU/RSV  testing. Fact Sheet for Patients: https://www.moore.com/ Fact Sheet for Healthcare Providers: https://www.young.biz/ This  test is not yet approved or cleared by the [place] FDA and  has been authorized for detection and/or diagnosis of SARS-CoV-2 by  FDA under an Emergency Use Authorization (EUA). This EUA will remain  in effect (meaning this test can be used) for the duration of the  Covid-19 declaration under Section 564(b)(1) of the Act, 21  U.S.C. section 360bbb-3(b)(1), unless the authorization is  terminated or revoked. Performed at [facility], [address] [place], [place] [address]     Blood Alcohol level:  Lab Results  Component Value Date   ETH <10 10/06/2019   ETH 301 (HH) 02/21/2019    Metabolic Disorder Labs:  Lab Results  Component Value Date   HGBA1C 4.9 03/28/2019   MPG 93.93 03/28/2019   MPG 99.67 05/07/2018   No results found for: PROLACTIN Lab Results  Component Value Date   CHOL 179 03/28/2019   TRIG 32 03/28/2019   HDL 95 03/28/2019   CHOLHDL 1.9 03/28/2019   VLDL 6 03/28/2019   LDLCALC 78 03/28/2019   LDLCALC 66 05/07/2018    Current Medications: Current Facility-Administered Medications  Medication Dose Route Frequency Provider Last Rate Last Admin  . acetaminophen (TYLENOL) tablet 650 mg  650 mg Oral Q6H PRN Anike, Adaku C, NP      .  alum & mag hydroxide-simeth (MAALOX/MYLANTA) 200-200-20 MG/5ML suspension 30 mL  30 mL Oral Q4H PRN Anike, Adaku C, NP      . benztropine (COGENTIN) tablet 0.5 mg  0.5 mg Oral BID Johnn Hai, MD      . hydrOXYzine (ATARAX/VISTARIL) tablet 25 mg  25 mg Oral TID PRN Anike, Adaku C, NP   25 mg at 10/15/19 0018  . lithium carbonate (ESKALITH) CR tablet 450 mg  450 mg Oral Q12H Johnn Hai, MD      . magnesium hydroxide (MILK OF MAGNESIA) suspension 30 mL  30 mL Oral Daily PRN Anike, Adaku C, NP      . nicotine (NICODERM CQ - dosed in mg/24 hours) patch 14 mg  14 mg Transdermal Daily Anike, Adaku C, NP   14 mg at 10/15/19 0745  . perphenazine (TRILAFON) tablet 8 mg  8 mg Oral TID Johnn Hai, MD      .  temazepam (RESTORIL) capsule 30 mg  30 mg Oral QHS Johnn Hai, MD      . traZODone (DESYREL) tablet 50 mg  50 mg Oral QHS PRN Anike, Adaku C, NP   50 mg at 10/15/19 0013   PTA Medications: Medications Prior to Admission  Medication Sig Dispense Refill Last Dose  . amantadine (SYMMETREL) 100 MG capsule Take 100 mg by mouth every evening.      . benztropine (COGENTIN) 1 MG tablet Take 1 tablet (1 mg total) by mouth 2 (two) times daily. (Patient not taking: Reported on 10/06/2019) 60 tablet 2   . disulfiram (ANTABUSE) 250 MG tablet Take 250 mg by mouth daily.     Marland Kitchen FLUoxetine (PROZAC) 20 MG capsule Take 1 capsule (20 mg total) by mouth daily. (Patient taking differently: Take 20-40 mg by mouth See admin instructions. 40 MG in the morning and 20 MG in the evening) 30 capsule 2   . lamoTRIgine (LAMICTAL) 25 MG tablet Take 2 tablets (50 mg total) by mouth 2 (two) times daily. (Patient taking differently: Take 25-50 mg by mouth See admin instructions. 25 MG in the morning and 50 MG in the evening) 120 tablet 2   . naltrexone (DEPADE) 50 MG tablet Take 1 tablet (50 mg total) by mouth daily. (Patient taking differently: Take 50 mg by mouth every evening. ) 90 tablet 1   . perphenazine (TRILAFON) 4 MG tablet Take 4 mg by mouth at bedtime.       Musculoskeletal: Strength & Muscle Tone: within normal limits Gait & Station: normal Patient leans: N/A  Psychiatric Specialty Exam: Physical Exam  Nursing note and vitals reviewed. Constitutional: She appears well-developed and well-nourished.  Cardiovascular: Normal rate and regular rhythm.    Review of Systems  Constitutional: Negative.   Eyes: Negative.   Respiratory: Negative.   Cardiovascular: Negative.   Endocrine: Negative.   Genitourinary: Negative.   Neurological: Negative.     Blood pressure 128/87, pulse 93, temperature 98.4 F (36.9 C), temperature source Oral, resp. rate 18, height 5' 3.5" (1.613 m), weight 58.1 kg, SpO2 98 %.Body mass  index is 22.32 kg/m.  General Appearance: Casual  Eye Contact:  Good  Speech:  Clear and Coherent  Volume:  Normal  Mood:  Hypomanic/hyperverbal  Affect:  Constricted  Thought Process:  Irrelevant and Descriptions of Associations: Loose  Orientation:  Other:  Person place situation  Thought Content:  Illogical, Delusions, Paranoid Ideation and Flight of ideas/topic to topic  Suicidal Thoughts:  No  Homicidal Thoughts:  No  Memory:  Immediate;   Poor Recent;   Poor Remote;   Poor  Judgement:  Impaired  Insight:  Lacking  Psychomotor Activity:  Normal  Concentration:  Concentration: Fair and Attention Span: Poor  Recall:  Poor  Fund of Knowledge:  Poor  Language:  Good  Akathisia:  Negative  Handed:  Right  AIMS (if indicated):     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 Time Physical Health Resilience Social Support  ADL's:  Intact  Cognition:  WNL  Sleep:  Number of Hours: 2.5    Treatment Plan Summary: Daily contact with patient to assess and evaluate symptoms and progress in treatment and Medication management  Observation Level/Precautions:  15 minute checks  Laboratory:  HCG UDS  Psychotherapy: Reality based/med and illness education  Medications: Resume mood stabilizer/lithium therapy and perphenazine  Consultations:    Discharge Concerns: Longer-term stability  Estimated LOS: 7-10  Other:   Axis I schizoaffective bipolar type with multiple delusions expressed Axis II deferred Axis III medically stable   Physician Treatment Plan for Primary Diagnosis: Schizoaffective disorder (HCC) Long Term Goal(s): Improvement in symptoms so as ready for discharge  Short Term Goals: Ability to identify changes in lifestyle to reduce recurrence of condition will improve, Ability to identify and develop effective coping behaviors will improve and Compliance with prescribed medications will improve  Physician Treatment Plan for Secondary Diagnosis: Principal  Problem:   Schizoaffective disorder (HCC)  Long Term Goal(s): Improvement in symptoms so as ready for discharge  Short Term Goals: Ability to demonstrate self-control will improve, Ability to maintain clinical measurements within normal limits will improve and Compliance with prescribed medications will improve  I certify that inpatient services furnished can reasonably be expected to improve the patient's condition.    Malvin Johns, MD 2/10/20218:05 AM
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
## 2019-10-15 NOTE — [facility] Suicide Risk Assessment (Signed)
[facility] INPATIENT:  Family/Significant Other Suicide Prevention Education  Suicide Prevention Education:  Education Completed; Pt's mother, Stacy Poole, has been identified by the patient as the family member/significant other with whom the patient will be residing, and identified as the person(s) who will aid the patient in the event of a mental health crisis (suicidal ideations/suicide attempt).  With written consent from the patient, the family member/significant other has been provided the following suicide prevention education, prior to the and/or following the discharge of the patient.  The suicide prevention education provided includes the following:  Suicide risk factors  Suicide prevention and interventions  National Suicide Hotline telephone number  [facility] assessment telephone number  [facility] Emergency Assistance 911  [facility] and/or Residential Mobile Crisis Unit telephone number  Request made of family/significant other to:  Remove weapons (e.g., guns, rifles, knives), all items previously/currently identified as safety concern.    Remove drugs/medications (over-the-counter, prescriptions, illicit drugs), all items previously/currently identified as a safety concern.  The family member/significant other verbalizes understanding of the suicide prevention education information provided.  The family member/significant other agrees to remove the items of safety concern listed above.   CSW contact pt's mother to discuss patient's care. Pt's mother gave information about the patient coming back to the hospital and having concerns around her drinking habits and her substance use. Pt's mother stated that she is not allowed to drive anymore and that the patient truly needs a good therapist that will do EMDR, PTSD, and substance abuse therapy. Pt's mother asked for the CSW to keep her updated.   Delphia Grates 10/15/2019, 3:49 PM

## 2019-10-16 MED ORDER — FLUOXETINE HCL 10 MG PO CAPS
10.0000 mg | ORAL_CAPSULE | Freq: Every day | ORAL | Status: DC
  Administered 2019-10-16 – 2019-10-18 (×3): 10 mg via ORAL
  Filled 2019-10-16 (×6): qty 1

## 2019-10-16 MED ORDER — LORAZEPAM 2 MG/ML IJ SOLN
2.0000 mg | Freq: Once | INTRAMUSCULAR | Status: AC
  Administered 2019-10-16: 10:00:00 2 mg via INTRAMUSCULAR
  Filled 2019-10-16: qty 1

## 2019-10-16 MED ORDER — NALTREXONE HCL 50 MG PO TABS
50.0000 mg | ORAL_TABLET | Freq: Every day | ORAL | Status: DC
  Administered 2019-10-16 – 2019-10-24 (×9): 50 mg via ORAL
  Filled 2019-10-16 (×12): qty 1

## 2019-10-16 MED ORDER — AMANTADINE HCL 100 MG PO CAPS
100.0000 mg | ORAL_CAPSULE | Freq: Two times a day (BID) | ORAL | Status: DC
  Administered 2019-10-16 – 2019-10-24 (×17): 100 mg via ORAL
  Filled 2019-10-16 (×22): qty 1

## 2019-10-16 MED ORDER — LAMOTRIGINE 25 MG PO TABS
25.0000 mg | ORAL_TABLET | Freq: Two times a day (BID) | ORAL | Status: DC
  Administered 2019-10-16 – 2019-10-23 (×15): 25 mg via ORAL
  Filled 2019-10-16 (×18): qty 1

## 2019-10-16 NOTE — Progress Notes (Signed)
Pt reports that she lost her notebook and her [employer] jeans today. Pt told that she should inform social worker that this happened and she might be reimbursed at discharge. Also educated that any items brought on the unit are "at your own risk."

## 2019-10-16 NOTE — [facility] Group Notes (Signed)
[facility] LCSW Group Therapy Note  Date/Time: 10/16/2019 @ 1:30pm  Type of Therapy and Topic:  Group Therapy:  Overcoming Obstacles  Participation Level:  [facility] PARTICIPATION LEVEL: Did Not Attend  Description of Group:    In this group patients will be encouraged to explore what they see as obstacles to their own wellness and recovery. They will be guided to discuss their thoughts, feelings, and behaviors related to these obstacles. The group will process together ways to cope with barriers, with attention given to specific choices patients can make. Each patient will be challenged to identify changes they are motivated to make in order to overcome their obstacles. This group will be process-oriented, with patients participating in exploration of their own experiences as well as giving and receiving support and challenge from other group members.  Therapeutic Goals: 1. Patient will identify personal and current obstacles as they relate to admission. 2. Patient will identify barriers that currently interfere with their wellness or overcoming obstacles.  3. Patient will identify feelings, thought process and behaviors related to these barriers. 4. Patient will identify two changes they are willing to make to overcome these obstacles:    Summary of Patient Progress   There was no group due to high acuity of the unit.    Therapeutic Modalities:   Cognitive Behavioral Therapy Solution Focused Therapy Motivational Interviewing Relapse Prevention Therapy   Donte Lenzo, LCSW 

## 2019-10-16 NOTE — Progress Notes (Signed)
   10/16/19 2038  Psych Admission Type (Psych Patients Only)  Admission Status Voluntary  Psychosocial Assessment  Patient Complaints Anxiety  Eye Contact Intense  Facial Expression Anxious  Affect Anxious  Speech Tangential;Pressured  Interaction Assertive  Motor Activity Other (Comment) (WNL)  Appearance/Hygiene Unremarkable  Behavior Characteristics Cooperative  Mood Labile;Anxious  Thought Process  Coherency Disorganized;Flight of ideas;Tangential  Content Delusions;Religiosity  Delusions Religious;Paranoid  Perception WDL  Hallucination None reported or observed  Judgment Impaired  Confusion None  Danger to Self  Current suicidal ideation? Denies  Danger to Others  Danger to Others None reported or observed   Pt denies SI, HI, AVH. Pt endorses chronic pain in her back from numerous surgeries.

## 2019-10-16 NOTE — Progress Notes (Signed)
Pt stated she was having a hard time functioning because she was not taking her Lamictal.

## 2019-10-16 NOTE — Progress Notes (Signed)
Pt continuing to be delusional, pt stated she was in a gang, "queen of all fairies" . Pt constantly coming out of her room disturbing peers, pt stated she could not disturb other patients on the unit.

## 2019-10-16 NOTE — Progress Notes (Signed)
Recreation Therapy Notes  Date: 2.11.21 Time: 0950 Location: 500 Hall Dayroom  Group Topic: Leisure Education  Goal Area(s) Addresses:  Patient will identify positive leisure activities.  Patient will identify one positive benefit of participation in leisure activities.   Behavioral Response: None  Intervention: Leisure Group Game  Activity: Keep It Contractor.  LRT and patients had to toss or bounce the ball to each other.  LRT kept time of how long the ball was in motion.  The ball was to stay moving at all times.  The ball could not come to a stop.  If the ball stopped, the time started over.  Education:  Leisure Education, Building control surveyor  Education Outcome: Acknowledges education/In group clarification offered/Needs additional education  Clinical Observations/Feedback: Pt stated she couldn't participate because of something with her spine even though this was a seated activity.  Pt left but came back at the end of group.    [name] Rancher, LRT/CTRS         [name] Rancher A 10/16/2019 10:41 AM
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
## 2019-10-17 MED ORDER — TRAZODONE HCL 100 MG PO TABS
200.0000 mg | ORAL_TABLET | Freq: Every day | ORAL | Status: DC
  Administered 2019-10-17 – 2019-10-18 (×2): 200 mg via ORAL
  Filled 2019-10-17 (×4): qty 2

## 2019-10-17 MED ORDER — SALINE SPRAY 0.65 % NA SOLN
1.0000 | NASAL | Status: DC | PRN
  Administered 2019-10-17 – 2019-10-24 (×11): 1 via NASAL
  Filled 2019-10-17 (×2): qty 44

## 2019-10-17 MED ORDER — HYDROXYZINE HCL 25 MG PO TABS
25.0000 mg | ORAL_TABLET | Freq: Once | ORAL | Status: AC
  Administered 2019-10-17: 04:00:00 25 mg via ORAL
  Filled 2019-10-17: qty 1

## 2019-10-17 MED ORDER — ZIPRASIDONE MESYLATE 20 MG IM SOLR
10.0000 mg | Freq: Four times a day (QID) | INTRAMUSCULAR | Status: DC | PRN
  Administered 2019-10-17 (×2): 10 mg via INTRAMUSCULAR
  Filled 2019-10-17 (×2): qty 20

## 2019-10-17 MED ORDER — TRAZODONE HCL 100 MG PO TABS: 200.0000 mg | ORAL_TABLET | Freq: Every evening | ORAL | Status: DC | PRN

## 2019-10-17 MED ORDER — PRAZOSIN HCL 2 MG PO CAPS
4.0000 mg | ORAL_CAPSULE | Freq: Every day | ORAL | Status: DC
  Administered 2019-10-17 – 2019-10-18 (×2): 4 mg via ORAL
  Filled 2019-10-17 (×3): qty 2
  Filled 2019-10-17: qty 4

## 2019-10-17 NOTE — Progress Notes (Signed)
[place] NOVEL CORONAVIRUS (COVID-19) DAILY CHECK-OFF SYMPTOMS - answer yes or no to each - every day NO YES  Have you had a fever in the past 24 hours?  . Fever (Temp > 37.80C / 100F) X   Have you had any of these symptoms in the past 24 hours? . New Cough .  Sore Throat  .  Shortness of Breath .  Difficulty Breathing .  Unexplained Body Aches   X   Have you had any one of these symptoms in the past 24 hours not related to allergies?   . Runny Nose .  Nasal Congestion .  Sneezing   X   If you have had runny nose, nasal congestion, sneezing in the past 24 hours, has it worsened?  X   EXPOSURES - check yes or no X   Have you traveled outside the state in the past 14 days?  X   Have you been in contact with someone with a confirmed diagnosis of COVID-19 or PUI in the past 14 days without wearing appropriate PPE?  X   Have you been living in the same home as a person with confirmed diagnosis of COVID-19 or a PUI (household contact)?    X   Have you been diagnosed with COVID-19?    X              What to do next: Answered NO to all: Answered YES to anything:   Proceed with unit schedule Follow the [facility] Inpatient Flowsheet.   Aniken Monestime K. Cache Decoursey MSN, RN, [facility] [phone] 

## 2019-10-17 NOTE — Progress Notes (Signed)
  Pt is caucacian female  of 29 y.o. , DOB [birth year], MRN  [id number]  presents Voluntary with manic behavior,    Patient reports good appetite, energy normal, and good sleeping pattern  Pt denies   Depression,hopelessness,and anxiety  Pt also denies SI , HI, or AVH.  Pt reports LBM today, denies pain today. Vitals signs WNL and being monitored.   Medication given as Rx, no adverse reaction observed, safety maintained with q15 minute checks.   Einar Crow. Melvyn Neth MSN, RN, [facility] [phone]

## 2019-10-17 NOTE — Progress Notes (Signed)
Pt back up again at nurse's station. States she has chest pain d/t acid reflux. Pt given Maalox for indigestion and ginger ale. Pt told to go back to bed and get some sleep.

## 2019-10-17 NOTE — Progress Notes (Signed)
Pt feeling anxious. Thought it was time to get up. Pt asking for Vistaril. Pt given Vistaril 25mg .

## 2019-10-17 NOTE — Progress Notes (Signed)
Pt back out of her bed again. Says that she sees people in her room. Pt at nurse's station, doubling over and holding herself. Pt says, "I'm trying not to break from this reality. I need an antipsychotic quick." Pt told that she had all 3 doses of her Trilafon yesterday and the next dose isn't due until 0800. Provider called and given verbal order for another Vistaril 25 mg. Pt stated that "my mom manages my meds and I forgot that I could take Vistaril when I need it." Pt educated on what "as needed" means as far as time between doses. Pt given medication and told to stay in her room.

## 2019-10-17 NOTE — Progress Notes (Signed)
Spirituality group facilitated by Wilkie Aye, MDiv, B[facility].  Group focused on the topic of "hope."   Patients were led in a facilitated discussion, creating a word cloud to respond to the prompt "Hope is."   Patients named experiences both past and present that inform their understanding of hope  Patients engaged in a visual explorer activity - choosing a photo that represented hope for today.    Stacy Poole left group after introductions.  She returned at end of group and presented as hyperverbal with flight of ideas.  She was speaking to herself about staying in room and expressed that she could not stay in room, as she thought she was going to be disruptive.  She  was able to contribute to group discussion.  She spoke abstractly of campaigning for Obama and Biden.  After group she spoke individually with Chaplain and nursing students.  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  Was encouraged to rest and affirmed for finding a place of safety.
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
## 2019-10-17 NOTE — Progress Notes (Signed)
Recreation Therapy Notes  Date: 2.12.21 Time: 1000 Location:  [address] Dayroom  Group Topic: Communication, Team Building, Problem Solving  Goal Area(s) Addresses:  Patient will effectively work with peer towards shared goal.  Patient will identify skills used to make activity successful.  Patient will identify how skills used during activity can be used to reach post d/c goals.   Behavioral Response: None  Intervention: STEM Activity  Activity:  Straw Bridge.  In groups, patients were given 15 plastic straws and a 89ft piece of masking tape.  Patients were to build a free standing bridge using all the straws and tape given.  Patients could bend straws as needed to construct bridge.  The bridge constructed had to be able to hold a small puzzle box.    Education: Pharmacist, community, Discharge Planning   Education Outcome: Acknowledges education/In group clarification offered/Needs additional education.   Clinical Observations/Feedback: Pt was unable to focus.  Pt was talking loud and being disruptive.  Pt couldn't follow instructions.  Pt left and did not return.     [name] Rancher, LRT/CTRS     Lillia Abed, Murrel Freet A 10/17/2019 11:30 AM

## 2019-10-18 MED ORDER — OLANZAPINE 10 MG PO TBDP
10.0000 mg | ORAL_TABLET | Freq: Three times a day (TID) | ORAL | Status: DC | PRN
  Administered 2019-10-19 – 2019-10-23 (×4): 10 mg via ORAL
  Filled 2019-10-18 (×5): qty 1

## 2019-10-18 MED ORDER — ZIPRASIDONE MESYLATE 20 MG IM SOLR: 20.0000 mg | INTRAMUSCULAR | Status: DC | PRN

## 2019-10-18 MED ORDER — ZIPRASIDONE MESYLATE 20 MG IM SOLR
20.0000 mg | Freq: Four times a day (QID) | INTRAMUSCULAR | Status: DC | PRN
  Administered 2019-10-18 – 2019-10-21 (×3): 20 mg via INTRAMUSCULAR
  Filled 2019-10-18 (×4): qty 20

## 2019-10-18 MED ORDER — OLANZAPINE 5 MG PO TBDP
5.0000 mg | ORAL_TABLET | Freq: Every day | ORAL | Status: DC
  Administered 2019-10-18 – 2019-10-19 (×2): 5 mg via ORAL
  Filled 2019-10-18 (×5): qty 1

## 2019-10-18 MED ORDER — LORAZEPAM 1 MG PO TABS: 1.0000 mg | ORAL_TABLET | ORAL | Status: DC | PRN

## 2019-10-18 MED ORDER — OLANZAPINE 5 MG PO TBDP
15.0000 mg | ORAL_TABLET | Freq: Every day | ORAL | Status: DC
  Administered 2019-10-18: 15 mg via ORAL
  Filled 2019-10-18 (×2): qty 1

## 2019-10-18 MED ORDER — OLANZAPINE 10 MG PO TBDP: 10.0000 mg | ORAL_TABLET | Freq: Three times a day (TID) | ORAL | Status: DC | PRN

## 2019-10-18 MED ORDER — LORAZEPAM 1 MG PO TABS
1.0000 mg | ORAL_TABLET | ORAL | Status: DC | PRN
  Administered 2019-10-18 – 2019-10-24 (×6): 1 mg via ORAL
  Filled 2019-10-18 (×6): qty 1

## 2019-10-18 MED ORDER — CARBAMAZEPINE 100 MG PO CHEW
200.0000 mg | CHEWABLE_TABLET | Freq: Two times a day (BID) | ORAL | Status: DC
  Administered 2019-10-18 – 2019-10-23 (×11): 200 mg via ORAL
  Filled 2019-10-18 (×14): qty 2

## 2019-10-18 NOTE — Progress Notes (Signed)
Pt asked about Saphris. She wants to speak to the provider about this medication.
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
## 2019-10-18 NOTE — Progress Notes (Signed)
Pt is intrusive and hyperverbal this morning. Pt slept 1.25 hours. Pt is still delusional.

## 2019-10-18 NOTE — Progress Notes (Signed)
Pt back out of room, "I am seeing things and hearing things when I try to lay down and go to sleep. What should I do?" Pt told to go back to bed and try to relax. Pt informed when the next time she can have medication.

## 2019-10-18 NOTE — Progress Notes (Signed)
Pt still wandering the hall and coming to the nurse's station. Pt states that her nose is stopped up and she needs a Sudafed. Pt states she also needs hand sanitizer to keep the demons away. Provider contacted and pt given order for saline nasal spray. Pt given Vistaril 25 mg for anxiety and was allowed to use saline spray.

## 2019-10-18 NOTE — Progress Notes (Signed)
Pt at the nurse's station again. Pt says she is seeing and hearing things and afraid to break from reality. Pt told to take a deep breath and lie down and allow the medication to work. Pt keeps taking showers to stay awake.

## 2019-10-18 NOTE — Progress Notes (Signed)
Pt given IM Geodon 10 mg as well as trazodone 200 mg, prazosin 4 mg and vistaril 25 mg for anxiety, agitation and verbally aggressive behavior. Pt took meds willingly.

## 2019-10-18 NOTE — Progress Notes (Signed)
   10/17/19 1930  Psych Admission Type (Psych Patients Only)  Admission Status Voluntary  Psychosocial Assessment  Patient Complaints Anxiety;Agitation  Eye Contact Intense  Facial Expression Anxious  Affect Anxious  Speech Tangential;Pressured;Loud  Interaction Assertive  Motor Activity Hyperactive;Restless  Appearance/Hygiene Unremarkable  Behavior Characteristics Agitated;Anxious;Cooperative  Mood Labile;Anxious  Aggressive Behavior  Targets Other (Comment) (staff)  Type of Behavior Verbal;Threatening  Effect No apparent injury  Thought Process  Coherency Disorganized;Flight of ideas;Tangential  Content Delusions;Religiosity  Delusions Religious;Paranoid  Perception Hallucinations  Hallucination Visual  Judgment Impaired  Confusion None  Danger to Self  Current suicidal ideation? Denies  Danger to Others  Danger to Others None reported or observed   Pt was agitated on the unit when this writer arrived. Pt verbally aggressive towards staff. Pt still delusional - religious and grandeur. Pt denies SI, HI, AVH and pain.

## 2019-10-18 NOTE — Progress Notes (Signed)
Adult Psychoeducational Group Note  Date:  10/18/2019 Time:  9:30 PM  Group Topic/Focus:  Wrap-Up Group:   The focus of this group is to help patients review their daily goal of treatment and discuss progress on daily workbooks.  Participation Level:  Active  Participation Quality:  Appropriate  Affect:  Appropriate  Cognitive:  Appropriate  Insight: Lacking  Engagement in Group:  Engaged  Modes of Intervention:  Socialization and Support  Additional Comments:  Patient attended and participated in group tonight. She reports that she cant remember what she did today due to medication given to her. Today she spoke with her doctor.  Lita Mains [facility] 10/18/2019, 9:30 PM

## 2019-10-18 NOTE — Progress Notes (Signed)
   10/18/19 1300  Psych Admission Type (Psych Patients Only)  Admission Status Voluntary  Psychosocial Assessment  Patient Complaints Anxiety  Eye Contact Intense  Facial Expression Anxious  Affect Anxious  Speech Pressured;Tangential  Interaction Assertive  Motor Activity Restless  Appearance/Hygiene Unremarkable  Behavior Characteristics Cooperative;Anxious  Mood Anxious  Aggressive Behavior  Targets  (staff)  Type of Behavior Verbal;Threatening  Effect No apparent injury  Thought Process  Coherency Disorganized;Flight of ideas;Tangential  Content Delusions;Religiosity  Delusions Religious;Paranoid  Perception WDL  Hallucination None reported or observed  Judgment Impaired  Confusion None  Danger to Self  Current suicidal ideation? Denies  Danger to Others  Danger to Others None reported or observed

## 2019-10-18 NOTE — [facility] Group Notes (Signed)
Adult Psychoeducational Group Note  Date:  10/18/2019 Time:  12:27 PM  Group Topic/Focus:  Goals Group:   The focus of this group is to help patients establish daily goals to achieve during treatment and discuss how the patient can incorporate goal setting into their daily lives to aide in recovery.  Participation Level:  Minimal  Participation Quality:  Pt sat down for less than a minute. Stood up in the group and said "I am tramatized" and walked out  Affect:  Flat  Cognitive:  Lacking  Insight: None  Engagement in Group:  None  Modes of Intervention:  Discussion  Additional Comments:    Dione Housekeeper 10/18/2019, 12:27 PM

## 2019-10-19 MED ORDER — OLANZAPINE 10 MG PO TBDP
20.0000 mg | ORAL_TABLET | Freq: Every day | ORAL | Status: DC
  Administered 2019-10-19: 21:00:00 20 mg via ORAL
  Filled 2019-10-19 (×2): qty 2

## 2019-10-19 MED ORDER — TRAZODONE HCL 150 MG PO TABS
150.0000 mg | ORAL_TABLET | Freq: Every day | ORAL | Status: DC
  Administered 2019-10-19: 21:00:00 150 mg via ORAL
  Filled 2019-10-19 (×4): qty 1

## 2019-10-19 MED ORDER — PRAZOSIN HCL 2 MG PO CAPS
2.0000 mg | ORAL_CAPSULE | Freq: Every day | ORAL | Status: DC
  Administered 2019-10-19 – 2019-10-23 (×5): 2 mg via ORAL
  Filled 2019-10-19 (×7): qty 1
  Filled 2019-10-19: qty 2
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
## 2019-10-19 NOTE — Progress Notes (Signed)
   10/19/19 2035  Psych Admission Type (Psych Patients Only)  Admission Status Voluntary  Psychosocial Assessment  Patient Complaints Anxiety;Confusion  Eye Contact Fair  Facial Expression Anxious  Affect Anxious;Inconsistent with thought content  Speech Pressured;Tangential;Soft  Interaction Assertive  Motor Activity Restless  Appearance/Hygiene Unremarkable  Behavior Characteristics Anxious;Fidgety;Hyperactive;Restless  Mood Anxious;Preoccupied  Aggressive Behavior  Targets  (staff)  Type of Behavior Verbal;Threatening  Effect No apparent injury  Thought Process  Coherency Disorganized;Flight of ideas;Tangential  Content Delusions  Delusions Grandeur  Perception WDL  Hallucination None reported or observed  Judgment Impaired  Confusion None  Danger to Self  Current suicidal ideation? Denies  Danger to Others  Danger to Others None reported or observed

## 2019-10-19 NOTE — Progress Notes (Signed)
Adult Psychoeducational Group Note  Date:  10/19/2019 Time:  10:18 PM  Group Topic/Focus:  Wrap-Up Group:   The focus of this group is to help patients review their daily goal of treatment and discuss progress on daily workbooks.  Participation Level:  Active  Participation Quality:  Appropriate  Affect:  Appropriate  Cognitive:  Appropriate  Insight: Appropriate  Engagement in Group:  Engaged  Modes of Intervention:  Discussion  Additional Comments:  Patient attended group and said that her day was a 6.  Her goal for today was to work on memory issues. Patient said she is still work on that process. Patient said she is still having auditory and visual hallucinations. Her nurse was notified.   Antonisha Waskey W Shaden Lacher 10/19/2019, 10:18 PM

## 2019-10-19 NOTE — [facility] Group Notes (Signed)
Adult Psychoeducational Group Note  Date:  10/19/2019 Time:  12:40 PM  Group Topic/Focus:  Progressive Relaxation; Progressive muscle relaxation, How to deep breathe and Visible Imagery.  Participation Level:  Did Not Attend   Dione Housekeeper 10/19/2019, 12:40 PM

## 2019-10-19 NOTE — [facility] Group Notes (Signed)
[facility] LCSW Group Therapy Note  Date/Time:  10/19/2019  11:00AM-12:00PM  Type of Therapy and Topic:  Group Therapy:  Music and Mood  Participation Level:  None   Description of Group: In this process group, members listened to a variety of genres of music and identified that different types of music evoke different responses.  Patients were encouraged to identify music that was soothing for them and music that was energizing for them.  Patients discussed how this knowledge can help with wellness and recovery in various ways including managing depression and anxiety as well as encouraging healthy sleep habits.    Therapeutic Goals: Patients will explore the impact of different varieties of music on mood Patients will verbalize the thoughts they have when listening to different types of music Patients will identify music that is soothing to them as well as music that is energizing to them Patients will discuss how to use this knowledge to assist in maintaining wellness and recovery Patients will explore the use of music as a coping skill  Summary of Patient Progress:  At the beginning of group, patient expressed that she felt "busy."  As soon as the first song was started, she left, saying she could not "listen to this music, it's against my rules, I can't explain it."  She was in and out of the room several times, but was distracting and loud each time she entered the room.  Each time she was asked to allow others to listen to the music, she would leave again while talking about the [employer].     Therapeutic Modalities: Solution Focused Brief Therapy Activity   Ambrose Mantle, LCSW

## 2019-10-19 NOTE — Progress Notes (Signed)
   10/19/19 2035  COVID-19 Daily Checkoff  Have you had a fever (temp > 37.80C/100F)  in the past 24 hours?  No  If you have had runny nose, nasal congestion, sneezing in the past 24 hours, has it worsened? No  COVID-19 EXPOSURE  Have you traveled outside the state in the past 14 days? No  Have you been in contact with someone with a confirmed diagnosis of COVID-19 or PUI in the past 14 days without wearing appropriate PPE? No  Have you been living in the same home as a person with confirmed diagnosis of COVID-19 or a PUI (household contact)? No  Have you been diagnosed with COVID-19? No

## 2019-10-19 NOTE — Progress Notes (Signed)
   10/19/19 1000  Psych Admission Type (Psych Patients Only)  Admission Status Voluntary  Psychosocial Assessment  Patient Complaints None  Eye Contact Fair  Facial Expression Anxious  Affect Anxious  Speech Pressured;Tangential;Soft  Interaction Assertive  Motor Activity Restless  Appearance/Hygiene Unremarkable  Behavior Characteristics Cooperative  Mood Anxious;Preoccupied;Pleasant  Aggressive Behavior  Effect No apparent injury  Thought Process  Coherency Disorganized;Flight of ideas;Tangential  Content Delusions  Delusions Grandeur  Perception WDL  Hallucination None reported or observed  Judgment Impaired  Confusion None  Danger to Self  Current suicidal ideation? Denies  Danger to Others  Danger to Others None reported or observed

## 2019-10-20 MED ORDER — FLUOXETINE HCL 10 MG PO CAPS
10.0000 mg | ORAL_CAPSULE | Freq: Every day | ORAL | Status: DC
  Administered 2019-10-20 – 2019-10-24 (×5): 10 mg via ORAL
  Filled 2019-10-20 (×7): qty 1

## 2019-10-20 MED ORDER — PERPHENAZINE 4 MG PO TABS
8.0000 mg | ORAL_TABLET | Freq: Every day | ORAL | Status: DC
  Administered 2019-10-20 – 2019-10-21 (×2): 8 mg via ORAL
  Filled 2019-10-20 (×4): qty 2

## 2019-10-20 MED ORDER — PERPHENAZINE 4 MG PO TABS
4.0000 mg | ORAL_TABLET | Freq: Every day | ORAL | Status: DC
  Administered 2019-10-20 – 2019-10-21 (×2): 4 mg via ORAL
  Filled 2019-10-20 (×3): qty 1

## 2019-10-20 MED ORDER — TRAZODONE HCL 100 MG PO TABS
200.0000 mg | ORAL_TABLET | Freq: Every day | ORAL | Status: DC
  Administered 2019-10-20 – 2019-10-22 (×3): 200 mg via ORAL
  Filled 2019-10-20 (×5): qty 2

## 2019-10-20 NOTE — Progress Notes (Signed)
DAR NOTE: Patient presents with anxious affect and mood.  Appears still disorganized, paranoid and delusional.  Denies suicidal thoughts, pain, auditory and visual hallucinations.  Described energy level as good and concentration as good.  Rates depression at 3, hopelessness at 0, and anxiety at 4.  Maintained on routine safety checks.  Medications given as prescribed.  Support and encouragement offered as needed.  States goal for today is "discharge plan." Patient observed socializing with peers in the dayroom.  Patient is safe on and off the unit.

## 2019-10-20 NOTE — Progress Notes (Deleted)
   10/20/19 2101  Precautions / Armbands  Precautions Other (Comment) (q15 min safety checks)  Patient armbands applied: Patient Identification (White)  [facility] Fall Risk Assessment  Age 29  Mental Status 0  Physical Satus 0  Elimination 0  Sensory Impairments 0  Gait or Balance 0  History or falls in past 6 months 0  Mood Stabilizer Medications 0  Benzodiazepines 0  Narcotics 0  Sedatives/Hypnotics 0  Atypical Anti Psychotics 0  Detox Protocol (Alcohol, Narcotics, etc.) 0  Total Score 0  Patient Fall Risk Level Low fall risk  Required Bundle Interventions *See Row Information* Low fall risk - low requirements implemented  Safe Patient Handling Required Documentation-Repositioning Needs  Assist with movement in bed No  Fragile skin with pressure ulcer No  Unresponsive No  Safe Patient Handling Assessment  Ambulates independently Yes, no lift needed

## 2019-10-20 NOTE — Progress Notes (Signed)
Recreation Therapy Notes  Date: 2.15.21 Time: 1000 Location: 500 Hall Dayroom  Group Topic: Coping Skills  Goal Area(s) Addresses:  Patient will identify positive coping skills. Patient will identify benefit of using positive coping skills.  Behavioral Response: None  Intervention: Magazines, Holiday representative paper, scissors, glue sticks  Activity: Coping Collage.  Patients were to use magazine to identify pictures of coping skills that could be used for diversions, social, cognitive, tension releasers and physical.  Patients were to find at least two positive coping skills for each.   Education: Pharmacologist, Building control surveyor.   Education Outcome: Acknowledges understanding/In group clarification offered/Needs additional education.   Clinical Observations/Feedback: Pt sat and listened for a few moments.  Pt expressed she was having issues with her back and couldn't participate.     [name] Rancher, LRT/CTRS         [name] Rancher A 10/20/2019 11:53 AM

## 2019-10-20 NOTE — Progress Notes (Signed)
   10/20/19 2059  Psych Admission Type (Psych Patients Only)  Admission Status Voluntary  Psychosocial Assessment  Patient Complaints Anxiety  Eye Contact Fair  Facial Expression Anxious  Affect Anxious;Inconsistent with thought content  Speech Pressured;Soft  Interaction Assertive  Motor Activity Restless  Appearance/Hygiene Unremarkable  Behavior Characteristics Cooperative;Anxious  Mood Anxious;Preoccupied  Aggressive Behavior  Effect No apparent injury  Thought Process  Coherency Disorganized;Flight of ideas;Tangential  Content Delusions  Delusions Grandeur  Perception WDL  Hallucination None reported or observed  Judgment Impaired  Confusion None  Danger to Self  Current suicidal ideation? Denies  Danger to Others  Danger to Others None reported or observed

## 2019-10-20 NOTE — [facility] Group Notes (Signed)
[facility] LCSW Group Therapy Note  Date/Time: 10/20/2019 @ 1:30pm  Type of Therapy and Topic:  Group Therapy:  Overcoming Obstacles  Participation Level:  [facility] PARTICIPATION LEVEL: Did Not Attend  Description of Group:    In this group patients will be encouraged to explore what they see as obstacles to their own wellness and recovery. They will be guided to discuss their thoughts, feelings, and behaviors related to these obstacles. The group will process together ways to cope with barriers, with attention given to specific choices patients can make. Each patient will be challenged to identify changes they are motivated to make in order to overcome their obstacles. This group will be process-oriented, with patients participating in exploration of their own experiences as well as giving and receiving support and challenge from other group members.  Therapeutic Goals: 1. Patient will identify personal and current obstacles as they relate to admission. 2. Patient will identify barriers that currently interfere with their wellness or overcoming obstacles.  3. Patient will identify feelings, thought process and behaviors related to these barriers. 4. Patient will identify two changes they are willing to make to overcome these obstacles:    Summary of Patient Progress   Patient did not attend group therapy today.    Therapeutic Modalities:   Cognitive Behavioral Therapy Solution Focused Therapy Motivational Interviewing Relapse Prevention Therapy   Stephannie Peters, LCSW

## 2019-10-20 NOTE — Tx Team (Signed)
Interdisciplinary Treatment and Diagnostic Plan Update  10/20/2019 Time of Session: 11:20am Stacy Poole MRN: [id number]  Principal Diagnosis: Schizoaffective disorder [facility])  Secondary Diagnoses: Principal Problem:   Schizoaffective disorder (HCC)   Current Medications:  Current Facility-Administered Medications  Medication Dose Route Frequency Provider Last Rate Last Admin  . acetaminophen (TYLENOL) tablet 650 mg  650 mg Oral Q6H PRN Anike, Adaku C, NP   650 mg at 10/20/19 0026  . alum & mag hydroxide-simeth (MAALOX/MYLANTA) 200-200-20 MG/5ML suspension 30 mL  30 mL Oral Q4H PRN Anike, Adaku C, NP   30 mL at 10/18/19 1828  . amantadine (SYMMETREL) capsule 100 mg  100 mg Oral BID Malvin Johns, MD   100 mg at 10/20/19 0831  . carbamazepine (TEGRETOL) chewable tablet 200 mg  200 mg Oral BID Antonieta Pert, MD   200 mg at 10/20/19 0831  . FLUoxetine (PROZAC) capsule 10 mg  10 mg Oral Daily Malvin Johns, MD   10 mg at 10/20/19 0831  . hydrOXYzine (ATARAX/VISTARIL) tablet 25 mg  25 mg Oral TID PRN Anike, Adaku C, NP   25 mg at 10/19/19 0526  . lamoTRIgine (LAMICTAL) tablet 25 mg  25 mg Oral BID Malvin Johns, MD   25 mg at 10/20/19 0831  . ziprasidone (GEODON) injection 20 mg  20 mg Intramuscular Q6H PRN Antonieta Pert, MD   20 mg at 10/18/19 0258   And  . OLANZapine zydis (ZYPREXA) disintegrating tablet 10 mg  10 mg Oral Q8H PRN Antonieta Pert, MD   10 mg at 10/20/19 0436   And  . LORazepam (ATIVAN) tablet 1 mg  1 mg Oral Q4H PRN Antonieta Pert, MD   1 mg at 10/20/19 0158  . magnesium hydroxide (MILK OF MAGNESIA) suspension 30 mL  30 mL Oral Daily PRN Anike, Adaku C, NP      . naltrexone (DEPADE) tablet 50 mg  50 mg Oral Daily Malvin Johns, MD   50 mg at 10/20/19 0831  . nicotine (NICODERM CQ - dosed in mg/24 hours) patch 14 mg  14 mg Transdermal Daily Anike, Adaku C, NP   14 mg at 10/20/19 0830  . perphenazine (TRILAFON) tablet 4 mg  4 mg Oral Daily Malvin Johns, MD   4  mg at 10/20/19 0831  . perphenazine (TRILAFON) tablet 8 mg  8 mg Oral QHS Malvin Johns, MD      . prazosin (MINIPRESS) capsule 2 mg  2 mg Oral QHS Antonieta Pert, MD   2 mg at 10/19/19 2032  . sodium chloride (OCEAN) 0.65 % nasal spray 1 spray  1 spray Each Nare PRN Nira Conn A, NP   1 spray at 10/20/19 0158  . traZODone (DESYREL) tablet 200 mg  200 mg Oral QHS Malvin Johns, MD       PTA Medications: Medications Prior to Admission  Medication Sig Dispense Refill Last Dose  . benztropine (COGENTIN) 1 MG tablet Take 1 tablet (1 mg total) by mouth 2 (two) times daily. 60 tablet 2 Past Month at Unknown time  . FLUoxetine (PROZAC) 20 MG capsule Take 40 mg by mouth daily.     Marland Kitchen lamoTRIgine (LAMICTAL) 25 MG tablet Take 50 mg by mouth daily.     Marland Kitchen lamoTRIgine (LAMICTAL) 25 MG tablet Take 25 mg by mouth at bedtime.     Marland Kitchen perphenazine (TRILAFON) 4 MG tablet Take 4 mg by mouth daily.     Marland Kitchen perphenazine (TRILAFON) 4 MG tablet Take  8 mg by mouth at bedtime.       Patient Stressors: Marital or family conflict Other: PTSD (exacerbated by mother's actions)  Patient Strengths: Ability for insight Average or above average intelligence Supportive family/friends  Treatment Modalities: Medication Management, Group therapy, Case management,  1 to 1 session with clinician, Psychoeducation, Recreational therapy.   Physician Treatment Plan for Primary Diagnosis: Schizoaffective disorder ([place]) Long Term Goal(s): Improvement in symptoms so as ready for discharge Improvement in symptoms so as ready for discharge   Short Term Goals: Ability to identify changes in lifestyle to reduce recurrence of condition will improve Ability to identify and develop effective coping behaviors will improve Compliance with prescribed medications will improve Ability to demonstrate self-control will improve Ability to maintain clinical measurements within normal limits will improve Compliance with prescribed medications  will improve  Medication Management: Evaluate patient's response, side effects, and tolerance of medication regimen.  Therapeutic Interventions: 1 to 1 sessions, Unit Group sessions and Medication administration.  Evaluation of Outcomes: Not Progressing  Physician Treatment Plan for Secondary Diagnosis: Principal Problem:   Schizoaffective disorder ([place])  Long Term Goal(s): Improvement in symptoms so as ready for discharge Improvement in symptoms so as ready for discharge   Short Term Goals: Ability to identify changes in lifestyle to reduce recurrence of condition will improve Ability to identify and develop effective coping behaviors will improve Compliance with prescribed medications will improve Ability to demonstrate self-control will improve Ability to maintain clinical measurements within normal limits will improve Compliance with prescribed medications will improve     Medication Management: Evaluate patient's response, side effects, and tolerance of medication regimen.  Therapeutic Interventions: 1 to 1 sessions, Unit Group sessions and Medication administration.  Evaluation of Outcomes: Not Progressing   RN Treatment Plan for Primary Diagnosis: Schizoaffective disorder ([place]) Long Term Goal(s): Knowledge of disease and therapeutic regimen to maintain health will improve  Short Term Goals: Ability to participate in decision making will improve, Ability to verbalize feelings will improve, Ability to disclose and discuss suicidal ideas, Ability to identify and develop effective coping behaviors will improve and Compliance with prescribed medications will improve  Medication Management: RN will administer medications as ordered by provider, will assess and evaluate patient's response and provide education to patient for prescribed medication. RN will report any adverse and/or side effects to prescribing provider.  Therapeutic Interventions: 1 on 1 counseling sessions,  Psychoeducation, Medication administration, Evaluate responses to treatment, Monitor vital signs and CBGs as ordered, Perform/monitor CIWA, COWS, AIMS and Fall Risk screenings as ordered, Perform wound care treatments as ordered.  Evaluation of Outcomes: Not Progressing   LCSW Treatment Plan for Primary Diagnosis: Schizoaffective disorder ([place]) Long Term Goal(s): Safe transition to appropriate next level of care at discharge, Engage patient in therapeutic group addressing interpersonal concerns.  Short Term Goals: Engage patient in aftercare planning with referrals and resources and Increase skills for wellness and recovery  Therapeutic Interventions: Assess for all discharge needs, 1 to 1 time with Social worker, Explore available resources and support systems, Assess for adequacy in community support network, Educate family and significant other(s) on suicide prevention, Complete Psychosocial Assessment, Interpersonal group therapy.  Evaluation of Outcomes: Not Progressing   Progress in Treatment: Attending groups: No. Participating in groups: No. Taking medication as prescribed: Yes. Toleration medication: Yes. Family/Significant other contact made: Yes, individual(s) contacted:  pt's mom Patient understands diagnosis: No. Discussing patient identified problems/goals with staff: Yes. Medical problems stabilized or resolved: Yes. Denies suicidal/homicidal ideation: Yes. Issues/concerns  per patient self-inventory: No. Other:   New problem(s) identified: No, Describe:  None  New Short Term/Long Term Goal(s): Medication stabilization, elimination of SI thoughts, and development of a comprehensive mental wellness plan. ,  Patient Goals:    Discharge Plan or Barriers: Patient will be discharged home with therapy and medication management.   Reason for Continuation of Hospitalization: Anxiety Delusions  Medication stabilization  Estimated Length of Stay: 3-5 days    Attendees: Patient: 10/20/2019   Physician:  10/20/2019   Nursing:  10/20/2019   RN Care Manager: 10/20/2019   Social Worker: Stephannie Peters, LCSW 10/20/2019   Recreational Therapist:  10/20/2019  Other:  10/20/2019  Other:  10/20/2019  Other: 10/20/2019     Scribe for Treatment Team: Delphia Grates, LCSW 10/20/2019 2:49 PM
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
## 2019-10-21 MED ORDER — ONDANSETRON HCL 4 MG PO TABS
4.0000 mg | ORAL_TABLET | Freq: Once | ORAL | Status: AC
  Administered 2019-10-21: 4 mg via ORAL
  Filled 2019-10-21 (×2): qty 1

## 2019-10-21 MED ORDER — PERPHENAZINE 4 MG PO TABS
4.0000 mg | ORAL_TABLET | Freq: Two times a day (BID) | ORAL | Status: DC
  Administered 2019-10-21 – 2019-10-22 (×2): 4 mg via ORAL
  Filled 2019-10-21 (×4): qty 1

## 2019-10-21 MED ORDER — LORAZEPAM 2 MG/ML IJ SOLN: 2.0000 mg | Freq: Once | INTRAMUSCULAR | Status: DC
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
## 2019-10-21 NOTE — Progress Notes (Signed)
Pt up complaining about having nightmares. Pt given Vistaril 25 mg for her anxiety. Pt directed to return to her room.

## 2019-10-21 NOTE — Progress Notes (Signed)
Pt out of her room again. Says she vomited in the toilet and asked for something for nausea. Provider notified and given one-time verbal order for Zofran 4 mg.

## 2019-10-21 NOTE — Progress Notes (Signed)
Recreation Therapy Notes  Date: 2.16.21 Time: 1000 Location: [address] Dayroom  Group Topic: Self-Esteem  Goal Area(s) Addresses:  Patient will successfully identify positive attributes about themselves.  Patient will successfully identify benefit of improved self-esteem.   Behavioral Response: Engaged  Intervention: Colored pencils, blank masks  Activity: How I See Me.  Patients were to design a blank mask to highlight some of the positive attributes about themselves.     Education:  Self-Esteem, Building control surveyor.   Education Outcome: Acknowledges education/In group clarification offered/Needs additional education  Clinical Observations/Feedback: Pt designed herself in a [employer] [place].  Pt also drew herself with a third eye.  Pt incorporated some shooting stars and triangles that have a total of 9 sides combined.  Pt was engaged in activity but also had moments where she would ramble on about things that had nothing to do with group.    [name] Rancher, LRT/CTRS     [name] Rancher A 10/21/2019 11:55 AM

## 2019-10-21 NOTE — Progress Notes (Signed)
   10/21/19 2052  Psych Admission Type (Psych Patients Only)  Admission Status Voluntary  Psychosocial Assessment  Patient Complaints Anxiety  Eye Contact Fair  Facial Expression Anxious  Affect Anxious  Speech Pressured;Soft;Rapid;Tangential  Interaction Assertive  Motor Activity Slow  Appearance/Hygiene Unremarkable  Behavior Characteristics Cooperative;Anxious  Mood Anxious;Labile;Preoccupied  Aggressive Behavior  Effect No apparent injury  Thought Process  Coherency Disorganized;Flight of ideas;Tangential  Content Delusions  Delusions Grandeur;Religious  Perception WDL  Hallucination None reported or observed  Judgment Impaired  Confusion None  Danger to Self  Current suicidal ideation? Denies  Danger to Others  Danger to Others None reported or observed   Pt tangential with grandiose delusions still. Pt c/o indigestion and given Maalox 30 mg.

## 2019-10-22 MED ORDER — PERPHENAZINE 8 MG PO TABS
8.0000 mg | ORAL_TABLET | Freq: Three times a day (TID) | ORAL | Status: DC
  Administered 2019-10-22 – 2019-10-24 (×7): 8 mg via ORAL
  Filled 2019-10-22 (×12): qty 1

## 2019-10-22 NOTE — Progress Notes (Signed)
Patient ID: Stacy Poole, female   DOB: 16-Dec-1990, 29 y.o.   MRN: [id number]   Patient asked to talk with CSW regarding going home. CSW expressed that she has appointments but that she will need to talk to the doctor regarding discharge.  Patient stated that she feels clear but that she has not been given her medications between 6am and 9am in the mornings which is when she needs to take her medications and that has been missing with her.

## 2019-10-22 NOTE — Progress Notes (Signed)
Pt up and worried about bruise on right forearm that she had on admission from a fall going to the liquor store. Bruise turning colors as it gets older which explained to pt is normal. Pt says it is tender to the touch still and is worried she may have fractured the bone. Pt ROM normal in that arm. No numbness or tingling reported in hand or fingers. Pt given tylenol for headache and told that it would also help with pain at bruise site.

## 2019-10-22 NOTE — Progress Notes (Signed)
Adult Psychoeducational Group Note  Date:  10/22/2019 Time:  8:28 PM  Group Topic/Focus:  Wrap-Up Group:   The focus of this group is to help patients review their daily goal of treatment and discuss progress on daily workbooks.  Participation Level:  Active  Participation Quality:  Appropriate  Affect:  Appropriate  Cognitive:  Appropriate  Insight: Appropriate  Engagement in Group:  Engaged  Modes of Intervention:  Discussion  Additional Comments:  Patient attended group and said that her day was a 7. Her goal for today was to discuss her discharge plan. Her goal wasn't accomplished.   Deserai Cansler W Lovene Maret 10/22/2019, 8:28 PM

## 2019-10-22 NOTE — Progress Notes (Signed)
Recreation Therapy Notes  Date:  2.17.21 Time: 1000 Location: [address] Dayroom  Group Topic: Stress Management  Goal Area(s) Addresses:  Patient will identify positive stress management techniques. Patient will identify benefits of using stress management post d/c.  Intervention: Stress Management  Activity : Meditation.  LRT a meditation that focused on clearing your chakras and doing positive self talk.  Patients were to listen and mentally focus on saying positive chants to self.  Education:  Stress Management, Discharge Planning.   Education Outcome: Acknowledges Education  Clinical Observations/Feedback: Pt did not attend group session.    [name] Rancher, LRT/CTRS        [name] Rancher A 10/22/2019 11:48 AM

## 2019-10-22 NOTE — Progress Notes (Signed)
[facility] LCSW Group Therapy Note  Date/Time: 2/17 @ 1:30pm   Type of Therapy/Topic:  Group Therapy:  Feelings about Diagnosis  Participation Level:  Minimal   Mood: Pleasant    Description of Group:    This group will allow patients to explore their thoughts and feelings about diagnoses they have received. Patients will be guided to explore their level of understanding and acceptance of these diagnoses. Facilitator will encourage patients to process their thoughts and feelings about the reactions of others to their diagnosis, and will guide patients in identifying ways to discuss their diagnosis with significant others in their lives. This group will be process-oriented, with patients participating in exploration of their own experiences as well as giving and receiving support and challenge from other group members.   Therapeutic Goals: 1. Patient will demonstrate understanding of diagnosis as evidence by identifying two or more symptoms of the disorder:  2. Patient will be able to express two feelings regarding the diagnosis 3. Patient will demonstrate ability to communicate their needs through discussion and/or role plays  Summary of Patient Progress:  Pt was able to to state how people use other's mental illness to manipulate. Pt mentioned she had a headache and left group.    Therapeutic Modalities:   Cognitive Behavioral Therapy Brief Therapy Feelings Identification   Earlyne Iba, MSW intern
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
## 2019-10-23 MED ORDER — TRAZODONE HCL 150 MG PO TABS
300.0000 mg | ORAL_TABLET | Freq: Every day | ORAL | Status: DC
  Administered 2019-10-23: 23:00:00 300 mg via ORAL
  Filled 2019-10-23 (×3): qty 2

## 2019-10-23 MED ORDER — LAMOTRIGINE 25 MG PO TABS
25.0000 mg | ORAL_TABLET | Freq: Every day | ORAL | Status: DC
  Administered 2019-10-24: 07:00:00 25 mg via ORAL
  Filled 2019-10-23 (×4): qty 1

## 2019-10-23 MED ORDER — ZIPRASIDONE MESYLATE 20 MG IM SOLR
20.0000 mg | Freq: Once | INTRAMUSCULAR | Status: AC
  Administered 2019-10-23: 20 mg via INTRAMUSCULAR

## 2019-10-23 MED ORDER — CARBAMAZEPINE 100 MG PO CHEW
200.0000 mg | CHEWABLE_TABLET | Freq: Three times a day (TID) | ORAL | Status: DC
  Administered 2019-10-23 – 2019-10-24 (×4): 200 mg via ORAL
  Filled 2019-10-23 (×10): qty 2

## 2019-10-23 NOTE — Progress Notes (Signed)
   10/22/19 2241  Psych Admission Type (Psych Patients Only)  Admission Status Voluntary  Psychosocial Assessment  Patient Complaints None  Eye Contact Fair  Facial Expression Anxious  Affect Anxious  Speech Pressured;Soft;Rapid;Tangential  Interaction Assertive  Motor Activity Slow  Appearance/Hygiene Unremarkable  Behavior Characteristics Cooperative  Mood Pleasant  Aggressive Behavior  Effect No apparent injury  Thought Process  Coherency Disorganized;Flight of ideas;Tangential  Content Delusions  Delusions Grandeur;Religious  Perception WDL  Hallucination None reported or observed  Judgment Impaired  Confusion None  Danger to Self  Current suicidal ideation? Denies  Danger to Others  Danger to Others None reported or observed

## 2019-10-23 NOTE — Progress Notes (Cosign Needed)
Psychoeducational Group Note  Date:  10/23/2019 Time:  2023  Group Topic/Focus:  Wrap-Up Group:   The focus of this group is to help patients review their daily goal of treatment and discuss progress on daily workbooks.  Participation Level: Did Not Attend  Participation Quality:  Not Applicable  Affect:  Not Applicable  Cognitive:  Not Applicable  Insight:  Not Applicable  Engagement in Group: Not Applicable  Additional Comments:  The patient did not attend group since she had returned to her room to go to sleep.   [name] S 10/23/2019, 8:23 PM
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
## 2019-10-23 NOTE — Progress Notes (Signed)
Recreation Therapy Notes  Date: 2.18.21 Time: 1000 Location: [address] Dayroom  Group Topic: Communication  Goal Area(s) Addresses:  Patient will effectively communicate with peers in group.  Patient will verbalize benefit of healthy communication. Patient will verbalize positive effect of healthy communication on post d/c goals.  Patient will identify communication techniques that made activity effective for group.   Intervention: Pencils, paper  Activity: Geometrical Drawings.  Patients took turns describing pictures to the group.  The person describing the picture could use as much detail as necessary to describe the picture.  The remaining group members had to draw the as it was described to them.  The group could not ask any clarifying questions.  The group could only ask the presenter to repeat themselves.  Education: Communication, Discharge Planning  Education Outcome: Acknowledges understanding/In group clarification offered/Needs additional education.   Clinical Observations/Feedback: Pt did not attend group session.     [name] Rancher, LRT/CTRS         Lillia Abed, Glenna Brunkow A 10/23/2019 11:19 AM

## 2019-10-23 NOTE — Progress Notes (Addendum)
   10/23/19 1945  Psych Admission Type (Psych Patients Only)  Admission Status Voluntary  Psychosocial Assessment  Patient Complaints Anxiety;Depression  Eye Contact Fair  Facial Expression Anxious  Affect Anxious;Depressed  Speech Pressured;Soft;Rapid;Tangential  Interaction Assertive  Motor Activity Other (Comment) (WNL)  Appearance/Hygiene Unremarkable  Behavior Characteristics Anxious;Cooperative;Impulsive;Intrusive  Mood Pleasant;Depressed;Anxious  Aggressive Behavior  Effect No apparent injury  Thought Process  Coherency Disorganized;Flight of ideas;Tangential  Content Delusions  Delusions Grandeur;Religious  Perception WDL  Hallucination None reported or observed  Judgment Impaired  Confusion None  Danger to Self  Current suicidal ideation? Denies  Danger to Others  Danger to Others None reported or observed   Pt still delusional. Agrees that Minipress is working stating, "my nightmares have been turned into poetic dreams that could be written for the movies." Pt asks that the Minipress be given during the day because she states it calms her anxiety and PTSD.

## 2019-10-24 MED ORDER — PERPHENAZINE 8 MG PO TABS: ORAL_TABLET | ORAL | 2 refills | Status: DC

## 2019-10-24 MED ORDER — CARBAMAZEPINE ER 200 MG PO TB12: 200.0000 mg | ORAL_TABLET | Freq: Two times a day (BID) | ORAL | 2 refills | Status: DC

## 2019-10-24 MED ORDER — FLUOXETINE HCL 10 MG PO CAPS: 10.0000 mg | ORAL_CAPSULE | Freq: Every day | ORAL | 2 refills | Status: DC

## 2019-10-24 MED ORDER — AMANTADINE HCL 100 MG PO CAPS: 100.0000 mg | ORAL_CAPSULE | Freq: Two times a day (BID) | ORAL | 2 refills | Status: DC

## 2019-10-24 MED ORDER — PRAZOSIN HCL 2 MG PO CAPS: 2.0000 mg | ORAL_CAPSULE | Freq: Every day | ORAL | 1 refills | Status: DC

## 2019-10-24 MED ORDER — TRAZODONE HCL 300 MG PO TABS: 300.0000 mg | ORAL_TABLET | Freq: Every evening | ORAL | 2 refills | Status: DC | PRN

## 2019-10-24 MED ORDER — QUETIAPINE FUMARATE 25 MG PO TABS: ORAL_TABLET | ORAL | 2 refills | Status: DC

## 2019-10-24 MED ORDER — LAMOTRIGINE 25 MG PO TABS: 25.0000 mg | ORAL_TABLET | Freq: Every day | ORAL | 1 refills | Status: DC

## 2019-10-24 MED ORDER — BENZTROPINE MESYLATE 1 MG PO TABS: 1.0000 mg | ORAL_TABLET | Freq: Two times a day (BID) | ORAL | 2 refills | Status: DC

## 2019-10-24 MED ORDER — LORAZEPAM 2 MG PO TABS: 2.0000 mg | ORAL_TABLET | Freq: Every day | ORAL | 0 refills | Status: DC | PRN

## 2019-10-24 MED ORDER — NALTREXONE HCL 50 MG PO TABS: 50.0000 mg | ORAL_TABLET | Freq: Every day | ORAL | 1 refills | Status: DC

## 2019-10-24 MED ORDER — ONDANSETRON HCL 4 MG PO TABS
4.0000 mg | ORAL_TABLET | Freq: Once | ORAL | Status: AC
  Administered 2019-10-24: 4 mg via ORAL
  Filled 2019-10-24 (×2): qty 1

## 2019-10-24 NOTE — Progress Notes (Signed)
The patient has come out of her bedroom about half a dozen times since waking up about an hour ago. Most recently, she approached this Thereasa Parkin stating that the medication which the nurse administered to her was tampered by the "neo nazis". She also claims that this is not a delusion and that it is in fact real. She is complaining of nausea.
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
## 2019-10-24 NOTE — [facility] Group Notes (Signed)
[facility] LCSW Group Therapy Note  Date/Time:  10/24/2019 9:00-10:00 or 10:00-11:00AM  Type of Therapy and Topic:  Group Therapy:  Healthy and Unhealthy Supports  Participation Level:  Did Not Attend   Description of Group:  Patients in this group were introduced to the idea of adding a variety of healthy supports to address the various needs in their lives.Patients discussed what additional healthy supports could be helpful in their recovery and wellness after discharge in order to prevent future hospitalizations.   An emphasis was placed on using counselor, doctor, therapy groups, 12-step groups, and problem-specific support groups to expand supports.  They also worked as a group on developing a specific plan for several patients to deal with unhealthy supports through boundary-setting, psychoeducation with loved ones, and even termination of relationships.   Therapeutic Goals:   1)  discuss importance of adding supports to stay well once out of the hospital  2)  compare healthy versus unhealthy supports and identify some examples of each  3)  generate ideas and descriptions of healthy supports that can be added  4)  offer mutual support about how to address unhealthy supports  5)  encourage active participation in and adherence to discharge plan    Summary of Patient Progress:  The patient did not attend   Therapeutic Modalities:   Motivational Interviewing Brief Solution-Focused Therapy  Evorn Gong

## 2019-10-24 NOTE — [facility] Suicide Risk Assessment (Signed)
[facility] Discharge Suicide Risk Assessment   Principal Problem: Schizoaffective disorder [facility]) Discharge Diagnoses: Principal Problem:   Schizoaffective disorder (HCC)   Total Time spent with patient: 45 minutes  Musculoskeletal: Strength & Muscle Tone: within normal limits Gait & Station: normal Patient leans: N/A  Psychiatric Specialty Exam: Physical Exam  Review of Systems  Blood pressure 116/78, pulse (!) 101, temperature 98.1 F (36.7 C), temperature source Oral, resp. rate 18, height 5' 3.5" (1.613 m), weight 58.1 kg, SpO2 99 %.Body mass index is 22.32 kg/m.  General Appearance: Casual  Eye Contact:  Good  Speech:  Clear and Coherent  Volume:  Normal  Mood:  Euthymic  Affect:  Congruent  Thought Process:  Descriptions of Associations: Loose  Orientation:  Full (Time, Place, and Person)  Thought Content:  Delusions  Suicidal Thoughts:  No  Homicidal Thoughts:  No  Memory:  Immediate;   Poor Recent;   Fair Remote;   Fair  Judgement:  Other:  Improved from admission  Insight:  Fair  Psychomotor Activity:  Normal  Concentration:  Concentration: Fair and Attention Span: Fair  Recall:  [employer] of Knowledge:  Fair  Language:  Fair  Akathisia:  Negative  Handed:  Right  AIMS (if indicated):     Assets:  Physical Health Resilience Social Support  ADL's:  Intact  Cognition:  WNL  Sleep:  Number of Hours: 6.5    Mental Status Per Nursing Assessment::   On Admission:  NA  Demographic Factors:  Unemployed  Loss Factors: Decrease in vocational status  Historical Factors: Impulsivity  Risk Reduction Factors:   Sense of responsibility to family, Religious beliefs about death, Living with another person, especially a relative, Positive social support and Positive therapeutic relationship  Continued Clinical Symptoms:  Bipolar Disorder:   Mixed State  Cognitive Features That Contribute To Risk:  Polarized thinking    Suicide Risk:  Minimal: No identifiable  suicidal ideation.  Patients presenting with no risk factors but with morbid ruminations; may be classified as minimal risk based on the severity of the depressive symptoms  Follow-up Information    [facility] Follow up on 10/23/2019.   Why: You are scheduled for an appointment on 10/23/19 at 10:00 am for medication management.  This appointment will be virtual.  Please have your insurance information and your discharge summary available. Contact information: [address] [place] [address] [phone]        [facility] Schedule an appointment as soon as possible for a visit.   Why: A referral has been made on your behalf requesting therapy with Hope Budds.  The provider will contact you to schedule an appointment. Contact information: [address] [place] [address] [phone]          Malvin Johns, MD 10/24/2019, 9:58 AM

## 2019-10-24 NOTE — Progress Notes (Signed)
  [facility] Adult Case Management Discharge Plan :  Will you be returning to the same living situation after discharge:  Yes,  with parent At discharge, do you have transportation home?: Yes,  mother Do you have the ability to pay for your medications: Yes,  BCBS  Release of information consent forms completed and in the chart;  Patient's signature needed at discharge.  Patient to Follow up at: Follow-up Information    [facility] Follow up on 11/06/2019.   Why: You are scheduled for an appointment on 11/06/19 at 2:30 pm for medication management.  This appointment will be virtual.  Please have your insurance information and your discharge summary available. Contact information: [address] [place] [address] [phone]        [facility] Schedule an appointment as soon as possible for a visit.   Why: A referral has been made on your behalf requesting therapy with Hope Budds.  Please contact this provider to schedule an appointment. Contact information: [address] [place] [address] [phone]           Next level of care provider has access to [facility] Link:no  Safety Planning and Suicide Prevention discussed: Yes,  with mother  Have you used any form of tobacco in the last 30 days? (Cigarettes, Smokeless Tobacco, Cigars, and/or Pipes): Yes  Has patient been referred to the Quitline?: Patient refused referral  Patient has been referred for addiction treatment: Pt. refused referral  Lorri Frederick, LCSW 10/24/2019, 11:16 AM

## 2019-10-24 NOTE — Progress Notes (Signed)
D:  Patient's self inventory sheet, patient sleeps good, no sleep medication.  Good appetite, normal energy level, good concentration.  Denied depression, hopeless and anxiety.  Denied withdrawals.   Denied SI.  Physical problems, back pain, spinal pain.  Denied physical pain at this time.  Sometimes has pain in back or spinal area.  Denied physical pain at this time.  Pain medicine is helpful.  Goal is discharge.  Plans to calm down.  No questions at this time, be careful who you hire in the future.  Thanks.  Does have discharge plans. A:  Medications administered per MD orders.  Emotional support and encouragement given patient. R:  Denied SI and HI, contracts for safety.  Denied A/V hallucinations.  Safety maintained with 15 minute checks.

## 2019-10-24 NOTE — Progress Notes (Signed)
Discharge Note:  Patient discharged home with family member.  Denied SI and HI.  Denied A/V hallucinations.  Denied pain.  Patient stated she received all her belongings, clothing, toiletries, etc.  Patient stated she appreciated all assistance received from  [facility] staff.  All required discharge information given to patient at discharge.
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
## 2019-10-24 NOTE — Progress Notes (Signed)
Pt states that she is nauseous. Contacted provider. Given one-time dose of Zofran 4 mg.

## 2019-10-24 NOTE — Progress Notes (Signed)
Spirituality group facilitated by Wilkie Aye, MDiv, BCC. Group focused on the topic of "hope." Patients were led in a facilitated discussion, creating a word cloud to respond to the prompt "Hope is." Patients named experiences both past and present that inform their understanding of hope Patients engaged in a visual explorer activity - choosing a photo that represented hope for today.   Stacy Poole was present throughout group this week.  She continues to present as tangential, though was able to be redirected to topic with consistent intervention from facilitator.  Presented with some awareness of speaking over other group members and expressed apologies.

## 2019-10-24 NOTE — Progress Notes (Signed)
Pt intrusive and loud this morning, leading other pts to do the same until the milieu is agitated. Tried to redirect pt to go to her room but her voice grew louder. Pt given 0800 meds early along with Ativan 1 mg.

## 2020-06-08 ENCOUNTER — Ambulatory Visit (INDEPENDENT_AMBULATORY_CARE_PROVIDER_SITE_OTHER): Payer: BC Managed Care – PPO | Admitting: Licensed Clinical Social Worker

## 2020-06-08 ENCOUNTER — Other Ambulatory Visit: Payer: Self-pay

## 2020-06-08 DIAGNOSIS — F1020 Alcohol dependence, uncomplicated: Secondary | ICD-10-CM

## 2020-06-08 DIAGNOSIS — F250 Schizoaffective disorder, bipolar type: Secondary | ICD-10-CM

## 2020-06-08 DIAGNOSIS — F10229 Alcohol dependence with intoxication, unspecified: Secondary | ICD-10-CM | POA: Diagnosis not present
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
## 2020-06-15 ENCOUNTER — Ambulatory Visit (INDEPENDENT_AMBULATORY_CARE_PROVIDER_SITE_OTHER): Payer: BC Managed Care – PPO | Admitting: Licensed Clinical Social Worker

## 2020-06-15 ENCOUNTER — Other Ambulatory Visit: Payer: Self-pay

## 2020-06-15 DIAGNOSIS — F250 Schizoaffective disorder, bipolar type: Secondary | ICD-10-CM | POA: Diagnosis not present

## 2020-06-15 DIAGNOSIS — F1020 Alcohol dependence, uncomplicated: Secondary | ICD-10-CM | POA: Diagnosis not present

## 2020-06-15 NOTE — Progress Notes (Signed)
   THERAPIST PROGRESS NOTE  Session Time: 10am-10:45  Participation Level: Active  Behavioral Response: Casual and NeatAlertEuthymic and per client 'neutral'  Type of Therapy: Individual Therapy  Treatment Goals addressed: Coping and Diagnosis: alcohol abuse  Interventions: CBT, Motivational Interviewing and Supportive  Summary: Stacy Poole is a 29 y.o. female who presents with alcohol use disorder severe and schizoaffective disorder being seen for goal of achieving and maintaining sobriety. Client shared being one day shy of 2 weeks sober and feels less foggy and more able to complete tasks during the day. Client states she has gotten a job which she is enjoying and which is helping her fill free time and create a schedule for herself. Client was receptive to psycho-educational information, identifying behaviors in stage of change and behaviors to be mindful of in the curve which could signal relapse. Client plans to practice mindfulness skill of her choice before next session.  Suicidal/Homicidal: Nowithout intent/plan  Therapist Response: Clinician checked in with client assessing for SI/HI/psychosis/substance use and overall level of functioning. Clinician inquired about recent successes and roadblocks to recovery. Clinician presented psycho-educational material on stages of change and the Dorise Bullion of Addiction and Recovery. Clinician provided client with 5 senses mindfulness activity to address anxiety and discouraged the use of kava to address anxiety in place of alcohol/lorazepam use. Clinician requested client review cost-benefit analysis worksheet from SMART recovery and practice a mindfulness skill at least 3 times prior to next therapy session.  Plan: Return again in 1 weeks.  Diagnosis: Axis I: Alcohol Abuse    Harlon Ditty, LCSW 06/15/2020

## 2020-06-22 ENCOUNTER — Other Ambulatory Visit: Payer: Self-pay

## 2020-06-22 ENCOUNTER — Ambulatory Visit (HOSPITAL_COMMUNITY): Payer: BC Managed Care – PPO | Admitting: Licensed Clinical Social Worker

## 2020-06-22 ENCOUNTER — Telehealth (HOSPITAL_COMMUNITY): Payer: Self-pay | Admitting: Licensed Clinical Social Worker

## 2020-06-29 NOTE — Telephone Encounter (Signed)
Client missed scheduled appointment. Client called back reporting relapse and taking a break from school this week to get well. Client rescheduled for individual therapy on 06/30/20

## 2020-06-30 ENCOUNTER — Emergency Department (HOSPITAL_COMMUNITY)
Admission: EM | Admit: 2020-06-30 | Discharge: 2020-06-30 | Disposition: A | Discharge status: Home / Self Care | Payer: BC Managed Care – PPO | Attending: Emergency Medicine | Admitting: Emergency Medicine

## 2020-06-30 ENCOUNTER — Other Ambulatory Visit: Payer: Self-pay

## 2020-06-30 ENCOUNTER — Telehealth (HOSPITAL_COMMUNITY): Payer: Self-pay | Admitting: Licensed Clinical Social Worker

## 2020-06-30 ENCOUNTER — Encounter (HOSPITAL_COMMUNITY): Payer: Self-pay | Admitting: Emergency Medicine

## 2020-06-30 ENCOUNTER — Ambulatory Visit (HOSPITAL_COMMUNITY): Payer: BC Managed Care – PPO | Admitting: Licensed Clinical Social Worker

## 2020-06-30 DIAGNOSIS — R000 Tachycardia, unspecified: Secondary | ICD-10-CM | POA: Diagnosis not present

## 2020-06-30 DIAGNOSIS — M791 Myalgia, unspecified site: Secondary | ICD-10-CM | POA: Diagnosis not present

## 2020-06-30 DIAGNOSIS — F1010 Alcohol abuse, uncomplicated: Secondary | ICD-10-CM | POA: Insufficient documentation

## 2020-06-30 DIAGNOSIS — R112 Nausea with vomiting, unspecified: Secondary | ICD-10-CM

## 2020-06-30 DIAGNOSIS — F606 Avoidant personality disorder: Secondary | ICD-10-CM | POA: Insufficient documentation

## 2020-06-30 DIAGNOSIS — F17210 Nicotine dependence, cigarettes, uncomplicated: Secondary | ICD-10-CM | POA: Insufficient documentation

## 2020-06-30 LAB — COMPREHENSIVE METABOLIC PANEL
ALT: 13 U/L (ref 0–44)
AST: 18 U/L (ref 15–41)
Albumin: 4 g/dL (ref 3.5–5.0)
Alkaline Phosphatase: 47 U/L (ref 38–126)
Anion gap: 15 (ref 5–15)
BUN: 13 mg/dL (ref 6–20)
CO2: 23 mmol/L (ref 22–32)
Calcium: 9.4 mg/dL (ref 8.9–10.3)
Chloride: 100 mmol/L (ref 98–111)
Creatinine, Ser: 0.72 mg/dL (ref 0.44–1.00)
GFR, Estimated: >60 mL/min (ref >60)
Glucose, Bld: 162 mg/dL — ABNORMAL HIGH (ref 70–99)
Potassium: 2.9 mmol/L — ABNORMAL LOW (ref 3.5–5.1)
Sodium: 138 mmol/L (ref 135–145)
Total Bilirubin: 0.5 mg/dL (ref 0.3–1.2)
Total Protein: 6.7 g/dL (ref 6.5–8.1)

## 2020-06-30 LAB — I-STAT BETA HCG BLOOD, ED (MC, WL, AP ONLY): I-stat hCG, quantitative: <5 m[IU]/mL (ref <5)

## 2020-06-30 LAB — CBC
HCT: 39.8 % (ref 36.0–46.0)
Hemoglobin: 13.6 g/dL (ref 12.0–15.0)
MCH: 29.6 pg (ref 26.0–34.0)
MCHC: 34.2 g/dL (ref 30.0–36.0)
MCV: 86.5 fL (ref 80.0–100.0)
Platelets: 394 10*3/uL (ref 150–400)
RBC: 4.6 MIL/uL (ref 3.87–5.11)
RDW: 13.3 % (ref 11.5–15.5)
WBC: 16.6 10*3/uL — ABNORMAL HIGH (ref 4.0–10.5)
nRBC: 0 % (ref 0.0–0.2)

## 2020-06-30 LAB — ETHANOL: Alcohol, Ethyl (B): 88 mg/dL — ABNORMAL HIGH (ref <10)

## 2020-06-30 LAB — LIPASE, BLOOD: Lipase: 33 U/L (ref 11–51)

## 2020-06-30 MED ORDER — ONDANSETRON HCL 4 MG/2ML IJ SOLN
4.0000 mg | Freq: Once | INTRAMUSCULAR | Status: AC
  Administered 2020-06-30: 4 mg via INTRAVENOUS
  Filled 2020-06-30: qty 2

## 2020-06-30 MED ORDER — FAMOTIDINE IN NACL 20-0.9 MG/50ML-% IV SOLN
20.0000 mg | INTRAVENOUS | Status: AC
  Administered 2020-06-30: 20 mg via INTRAVENOUS
  Filled 2020-06-30: qty 50

## 2020-06-30 MED ORDER — ONDANSETRON 4 MG PO TBDP: 4.0000 mg | ORAL_TABLET | Freq: Once | ORAL | Status: DC

## 2020-06-30 MED ORDER — SODIUM CHLORIDE 0.9 % IV BOLUS
2000.0000 mL | Freq: Once | INTRAVENOUS | Status: AC
  Administered 2020-06-30: 2000 mL via INTRAVENOUS

## 2020-06-30 MED ORDER — KETOROLAC TROMETHAMINE 15 MG/ML IJ SOLN
15.0000 mg | Freq: Once | INTRAMUSCULAR | Status: AC
  Administered 2020-06-30: 15 mg via INTRAVENOUS
  Filled 2020-06-30: qty 1

## 2020-06-30 NOTE — ED Triage Notes (Signed)
Patient arrived with EMS from home consumed alcohol this evening with multiple emesis , poor historian during encounter at triage . Alert/respirations unlabored.

## 2020-06-30 NOTE — ED Notes (Signed)
Pt walking around hallways unassisted demanding fluids be given to her. Staff informed by Diplomatic Services operational officer that pt has been calling 911. Pt asked if she was able to leave and informed that she is able to do so if she has a ride home. Pt was asked to stop calling 911 as she is already at the hospital.
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
## 2020-06-30 NOTE — Discharge Instructions (Signed)
Talk with your doctor about your concern for continuing Anabuse.  Return for new or concerning symptoms.

## 2020-07-01 ENCOUNTER — Ambulatory Visit (HOSPITAL_COMMUNITY): Payer: BC Managed Care – PPO | Admitting: Licensed Clinical Social Worker

## 2020-07-02 ENCOUNTER — Other Ambulatory Visit: Payer: Self-pay

## 2020-07-02 ENCOUNTER — Ambulatory Visit (HOSPITAL_COMMUNITY): Payer: BC Managed Care – PPO | Admitting: Licensed Clinical Social Worker

## 2020-07-02 ENCOUNTER — Telehealth (HOSPITAL_COMMUNITY): Payer: Self-pay | Admitting: Licensed Clinical Social Worker

## 2020-07-04 ENCOUNTER — Ambulatory Visit (HOSPITAL_COMMUNITY)
Admission: EM | Admit: 2020-07-04 | Discharge: 2020-07-05 | Disposition: A | Discharge status: Home / Self Care | Payer: BC Managed Care – PPO | Attending: Family | Admitting: Family

## 2020-07-04 ENCOUNTER — Encounter (HOSPITAL_COMMUNITY): Payer: Self-pay

## 2020-07-04 ENCOUNTER — Emergency Department (HOSPITAL_COMMUNITY)
Admission: EM | Admit: 2020-07-04 | Discharge: 2020-07-04 | Disposition: A | Discharge status: Other Acute Inpatient Hospital | Payer: BC Managed Care – PPO | Attending: Emergency Medicine | Admitting: Emergency Medicine

## 2020-07-04 ENCOUNTER — Other Ambulatory Visit: Payer: Self-pay

## 2020-07-04 DIAGNOSIS — F1010 Alcohol abuse, uncomplicated: Secondary | ICD-10-CM

## 2020-07-04 DIAGNOSIS — F10129 Alcohol abuse with intoxication, unspecified: Secondary | ICD-10-CM | POA: Diagnosis not present

## 2020-07-04 DIAGNOSIS — F250 Schizoaffective disorder, bipolar type: Secondary | ICD-10-CM | POA: Diagnosis not present

## 2020-07-04 DIAGNOSIS — R45851 Suicidal ideations: Secondary | ICD-10-CM

## 2020-07-04 DIAGNOSIS — Z20822 Contact with and (suspected) exposure to covid-19: Secondary | ICD-10-CM | POA: Diagnosis not present

## 2020-07-04 DIAGNOSIS — F17210 Nicotine dependence, cigarettes, uncomplicated: Secondary | ICD-10-CM | POA: Diagnosis not present

## 2020-07-04 DIAGNOSIS — F4310 Post-traumatic stress disorder, unspecified: Secondary | ICD-10-CM | POA: Diagnosis not present

## 2020-07-04 DIAGNOSIS — F1024 Alcohol dependence with alcohol-induced mood disorder: Secondary | ICD-10-CM

## 2020-07-04 LAB — CBC WITH DIFFERENTIAL/PLATELET
Abs Immature Granulocytes: 0.02 10*3/uL (ref 0.00–0.07)
Basophils Absolute: 0.1 10*3/uL (ref 0.0–0.1)
Basophils Relative: 1 %
Eosinophils Absolute: 0.1 10*3/uL (ref 0.0–0.5)
Eosinophils Relative: 1 %
HCT: 43.6 % (ref 36.0–46.0)
Hemoglobin: 14.4 g/dL (ref 12.0–15.0)
Immature Granulocytes: 0 %
Lymphocytes Relative: 34 %
Lymphs Abs: 2.8 10*3/uL (ref 0.7–4.0)
MCH: 29.3 pg (ref 26.0–34.0)
MCHC: 33 g/dL (ref 30.0–36.0)
MCV: 88.8 fL (ref 80.0–100.0)
Monocytes Absolute: 0.6 10*3/uL (ref 0.1–1.0)
Monocytes Relative: 7 %
Neutro Abs: 4.7 10*3/uL (ref 1.7–7.7)
Neutrophils Relative %: 57 %
Platelets: 353 10*3/uL (ref 150–400)
RBC: 4.91 MIL/uL (ref 3.87–5.11)
RDW: 14 % (ref 11.5–15.5)
WBC: 8.3 10*3/uL (ref 4.0–10.5)
nRBC: 0 % (ref 0.0–0.2)

## 2020-07-04 LAB — I-STAT BETA HCG BLOOD, ED (MC, WL, AP ONLY): I-stat hCG, quantitative: <5 m[IU]/mL (ref <5)

## 2020-07-04 LAB — COMPREHENSIVE METABOLIC PANEL
ALT: 12 U/L (ref 0–44)
AST: 20 U/L (ref 15–41)
Albumin: 4.5 g/dL (ref 3.5–5.0)
Alkaline Phosphatase: 51 U/L (ref 38–126)
Anion gap: 14 (ref 5–15)
BUN: 5 mg/dL — ABNORMAL LOW (ref 6–20)
CO2: 26 mmol/L (ref 22–32)
Calcium: 9.8 mg/dL (ref 8.9–10.3)
Chloride: 104 mmol/L (ref 98–111)
Creatinine, Ser: 0.65 mg/dL (ref 0.44–1.00)
GFR, Estimated: >60 mL/min (ref >60)
Glucose, Bld: 97 mg/dL (ref 70–99)
Potassium: 3.7 mmol/L (ref 3.5–5.1)
Sodium: 144 mmol/L (ref 135–145)
Total Bilirubin: 0.5 mg/dL (ref 0.3–1.2)
Total Protein: 8.3 g/dL — ABNORMAL HIGH (ref 6.5–8.1)

## 2020-07-04 LAB — ACETAMINOPHEN LEVEL: Acetaminophen (Tylenol), Serum: <10 ug/mL — ABNORMAL LOW (ref 10–30)

## 2020-07-04 LAB — RESPIRATORY PANEL BY RT PCR (FLU A&B, COVID)
Influenza A by PCR: NEGATIVE
Influenza B by PCR: NEGATIVE
SARS Coronavirus 2 by RT PCR: NEGATIVE

## 2020-07-04 LAB — ETHANOL: Alcohol, Ethyl (B): 188 mg/dL — ABNORMAL HIGH (ref <10)

## 2020-07-04 LAB — RAPID URINE DRUG SCREEN, HOSP PERFORMED
Amphetamines: NOT DETECTED
Barbiturates: NOT DETECTED
Benzodiazepines: NOT DETECTED
Cocaine: NOT DETECTED
Opiates: NOT DETECTED
Tetrahydrocannabinol: POSITIVE — AB

## 2020-07-04 LAB — SALICYLATE LEVEL: Salicylate Lvl: <7 mg/dL — ABNORMAL LOW (ref 7.0–30.0)

## 2020-07-04 LAB — POCT PREGNANCY, URINE: Preg Test, Ur: NEGATIVE

## 2020-07-04 MED ORDER — ACETAMINOPHEN 325 MG PO TABS: 650.0000 mg | ORAL_TABLET | Freq: Four times a day (QID) | ORAL | Status: DC | PRN

## 2020-07-04 MED ORDER — LORAZEPAM 2 MG/ML IJ SOLN: 0.0000 mg | Freq: Two times a day (BID) | INTRAMUSCULAR | Status: DC

## 2020-07-04 MED ORDER — PRAZOSIN HCL 1 MG PO CAPS
1.0000 mg | ORAL_CAPSULE | Freq: Every day | ORAL | Status: DC
  Filled 2020-07-04: qty 1

## 2020-07-04 MED ORDER — LORAZEPAM 2 MG/ML IJ SOLN: 0.0000 mg | Freq: Four times a day (QID) | INTRAMUSCULAR | Status: DC

## 2020-07-04 MED ORDER — GABAPENTIN 300 MG PO CAPS: 300.0000 mg | ORAL_CAPSULE | Freq: Three times a day (TID) | ORAL | Status: DC

## 2020-07-04 MED ORDER — THIAMINE HCL 100 MG/ML IJ SOLN: 100.0000 mg | Freq: Every day | INTRAMUSCULAR | Status: DC

## 2020-07-04 MED ORDER — THIAMINE HCL 100 MG PO TABS
100.0000 mg | ORAL_TABLET | Freq: Every day | ORAL | Status: DC
  Administered 2020-07-04: 100 mg via ORAL
  Filled 2020-07-04: qty 1

## 2020-07-04 MED ORDER — LORAZEPAM 1 MG PO TABS: 0.0000 mg | ORAL_TABLET | Freq: Four times a day (QID) | ORAL | Status: DC

## 2020-07-04 MED ORDER — ALUM & MAG HYDROXIDE-SIMETH 200-200-20 MG/5ML PO SUSP
30.0000 mL | ORAL | Status: DC | PRN
  Administered 2020-07-04: 30 mL via ORAL
  Filled 2020-07-04: qty 30

## 2020-07-04 MED ORDER — QUETIAPINE FUMARATE 25 MG PO TABS
25.0000 mg | ORAL_TABLET | Freq: Every day | ORAL | Status: DC
  Filled 2020-07-04: qty 1

## 2020-07-04 MED ORDER — LORAZEPAM 1 MG PO TABS: 0.0000 mg | ORAL_TABLET | Freq: Two times a day (BID) | ORAL | Status: DC

## 2020-07-04 MED ORDER — FLUOXETINE HCL 10 MG PO CAPS
10.0000 mg | ORAL_CAPSULE | Freq: Once | ORAL | Status: DC
  Filled 2020-07-04: qty 1

## 2020-07-04 MED ORDER — MAGNESIUM HYDROXIDE 400 MG/5ML PO SUSP: 30.0000 mL | Freq: Every day | ORAL | Status: DC | PRN

## 2020-07-04 MED ORDER — TRAZODONE HCL 150 MG PO TABS
300.0000 mg | ORAL_TABLET | Freq: Once | ORAL | Status: AC
  Administered 2020-07-05: 300 mg via ORAL
  Filled 2020-07-04: qty 2

## 2020-07-04 NOTE — ED Notes (Signed)
Pt is calm and cooperative. No c/o of pain or distress. Will continue to monitor pt for changes in behavior. Will continue to monitor for nsafety

## 2020-07-04 NOTE — ED Notes (Signed)
Pt sitting on bed combing her hair. Pt has no c/o of pain or distress. Will continue to monitor pt for safety
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
## 2020-07-04 NOTE — ED Notes (Signed)
Pt refused night time medications.

## 2020-07-04 NOTE — BH Assessment (Addendum)
Per Ophelia Shoulder, NP, patient evaluated and recommended for overnight observation at the [facility]. Heinz Knuckles, NP, accepts patient for admission to the [facility]. Requested nursing to coordinate transportation utilizing [employer]. Also, call report to #[phone] prior to transfer from [facility] to the [facility].  Charge nurse updated with disposition information.
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
## 2020-07-04 NOTE — ED Notes (Signed)
Locker #[phone] 

## 2020-07-04 NOTE — BH Assessment (Signed)
Attempted to reach patient nurse x 2 with disposition recommendations.  Per Ophelia Shoulder NP:  Recommending overnight observation with reassessment tomorrow (11/1).  Weyman Pedro, MSW, LCSW Outpatient Therapist/Triage Specialist
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
## 2020-07-04 NOTE — ED Notes (Signed)
Pt stated she doesn't take prozac anymore and refused to take it after multiple attempts. NP notified

## 2020-07-04 NOTE — ED Notes (Signed)
Offered patient a washcloth to wash the paint off of her face. Patient refused. Patient stated, "This is my war paint and I need it."

## 2020-07-04 NOTE — ED Notes (Signed)
Lunch given.

## 2020-07-04 NOTE — ED Notes (Signed)
Report given to Ethelene Browns at [facility] transport has been called to transport pt.

## 2020-07-04 NOTE — ED Notes (Signed)
TTS consult in progress. °

## 2020-07-04 NOTE — ED Provider Notes (Signed)
[facility]-EMERGENCY DEPT Provider Note   CSN: [id number] Arrival date & time: 07/04/20  0330     History Chief Complaint  Patient presents with  . Suicidal  . Alcohol Intoxication    Stacy Poole is a 29 y.o. female.  The history is provided by the patient, medical records and the police.  Alcohol Intoxication    29 y.o. F with hx of anxiety, insomnia, PTSD, substance abuse, presenting to the ED for suicidal ideation.  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  She has no plan of suicide other than "drinking herself to death".  She reports being a long term alcoholic.  Patient called [facility] herself and requested help.  Patient's body and face covered in blue body paint.  Past Medical History:  Diagnosis Date  . Acute ear infection   . Anxiety   . Arachnoid cyst   . Fifth disease   . Insomnia   . Mental disorder   . Mononucleosis   . PTSD (post-traumatic stress disorder)   . Substance abuse [facility])     Patient Active Problem List   Diagnosis Date Noted  . Schizoaffective disorder (HCC) 10/14/2019  . Schizophrenia (HCC) 03/27/2019  . Alcohol use disorder, severe, dependence (HCC) 03/05/2019  . 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 Alcohol abuse   . Psychosis (HCC)   . Schizoaffective disorder, bipolar type (HCC) 05/06/2018  . Tobacco use disorder 05/06/2018  . 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 Alcohol use disorder, moderate, dependence (HCC) 11/15/2015  . Cannabis use disorder, severe, dependence (HCC) 11/15/2015    History reviewed. No pertinent surgical history.   OB History   No obstetric history on file.     No family history on file.  Social History   Tobacco Use  . Smoking status: Current Every Day Smoker    Packs/day: 0.50    Years: 10.00    Pack years: 5.00    Types: Cigarettes  . Smokeless  tobacco: Never Used  Substance Use Topics  . Alcohol use: Yes    Alcohol/week: 10.0 standard drinks    Types: 10 Shots of liquor per week    Comment: daily  . Drug use: Yes    Types: Codeine, Benzodiazepines, Other-see comments    Home Medications Prior to Admission medications   Medication Sig Start Date End Date Taking? Authorizing Provider  amantadine (SYMMETREL) 100 MG capsule Take 1 capsule (100 mg total) by mouth 2 (two) times daily. 10/24/19   Malvin Johns, MD  benztropine (COGENTIN) 1 MG tablet Take 1 tablet (1 mg total) by mouth 2 (two) times daily. 10/24/19   Malvin Johns, MD  carbamazepine (TEGRETOL-XR) 200 MG 12 hr tablet Take 1 tablet (200 mg total) by mouth 2 (two) times daily. 10/24/19 10/23/20  Malvin Johns, MD  FLUoxetine (PROZAC) 10 MG capsule Take 1 capsule (10 mg total) by mouth daily. 10/25/19   Malvin Johns, MD  lamoTRIgine (LAMICTAL) 25 MG tablet Take 1 tablet (25 mg total) by mouth daily. 10/25/19   Malvin Johns, MD  LORazepam (ATIVAN) 2 MG tablet Take 1 tablet (2 mg total) by mouth daily as needed for anxiety. 10/24/19   Malvin Johns, MD  naltrexone (DEPADE) 50 MG tablet Take 1 tablet (50 mg total) by mouth daily. 10/25/19   Malvin Johns, MD  perphenazine (TRILAFON) 8 MG tablet 1 in a m 2 at hs 10/24/19   Jeannine Kitten,  Arlys John, MD  prazosin (MINIPRESS) 2 MG capsule Take 1 capsule (2 mg total) by mouth at bedtime. 10/24/19   Malvin Johns, MD  QUEtiapine (SEROQUEL) 25 MG tablet 1-2 q 8 h prn 10/24/19   Malvin Johns, MD  traZODone (DESYREL) 300 MG tablet Take 1 tablet (300 mg total) by mouth at bedtime as needed for sleep. 10/24/19   Malvin Johns, MD    Allergies    Tetracyclines & related  Review of Systems   Review of Systems  Psychiatric/Behavioral: Positive for suicidal ideas.  All other systems reviewed and are negative.   Physical Exam Updated Vital Signs BP 133/90 (BP Location: Left Arm)   Pulse (!) 110   Temp 99 F (37.2 C) (Oral)   Resp 16   Ht 5' 3.5" (1.613 m)    Wt 56.7 kg   SpO2 98%   BMI 21.80 kg/m   Physical Exam Vitals and nursing note reviewed.  Constitutional:      Appearance: She is well-developed.     Comments: Face and body covered in blue paint, intoxicated  HENT:     Head: Normocephalic and atraumatic.  Eyes:     Conjunctiva/sclera: Conjunctivae normal.     Pupils: Pupils are equal, round, and reactive to light.  Cardiovascular:     Rate and Rhythm: Normal rate and regular rhythm.     Heart sounds: Normal heart sounds.  Pulmonary:     Effort: Pulmonary effort is normal. No respiratory distress.     Breath sounds: Normal breath sounds. No rhonchi.  Abdominal:     General: Bowel sounds are normal.     Palpations: Abdomen is soft.     Tenderness: There is no abdominal tenderness. There is no rebound.  Musculoskeletal:        General: Normal range of motion.     Cervical back: Normal range of motion.  Skin:    General: Skin is warm and dry.  Neurological:     Mental Status: She is alert and oriented to person, place, and time.  Psychiatric:        Attention and Perception: She does not perceive auditory hallucinations.        Thought Content: Thought content includes suicidal ideation. Thought content does not include homicidal ideation. Thought content does not include homicidal or suicidal plan.     ED Results / Procedures / Treatments   Labs (all labs ordered are listed, but only abnormal results are displayed) Labs Reviewed  ETHANOL - Abnormal; Notable for the following components:      Result Value   Alcohol, Ethyl (B) 188 (*)    All other components within normal limits  SALICYLATE LEVEL - Abnormal; Notable for the following components:   Salicylate Lvl <7.0 (*)    All other components within normal limits  ACETAMINOPHEN LEVEL - Abnormal; Notable for the following components:   Acetaminophen (Tylenol), Serum <10 (*)    All other components within normal limits  COMPREHENSIVE METABOLIC PANEL - Abnormal; Notable  for the following components:   BUN 5 (*)    Total Protein 8.3 (*)    All other components within normal limits  RESPIRATORY PANEL BY RT PCR (FLU A&B, COVID)  CBC WITH DIFFERENTIAL/PLATELET  RAPID URINE DRUG SCREEN, HOSP PERFORMED  I-STAT BETA HCG BLOOD, ED ([facility], [facility], [facility] ONLY)    EKG None  Radiology No results found.  Procedures Procedures (including critical care time)  Medications Ordered in ED Medications - No data to display  ED Course  I have reviewed the triage vital signs and the nursing notes.  Pertinent labs & imaging results that were available during my care of the patient were reviewed by me and considered in my medical decision making (see chart for details).    MDM Rules/Calculators/A&P  29 y.o. F here with SI. No specific plan other than "drinking herself to death".  She reports she is along term alcoholic. Patient arrives covered in blue body paint.  She appears intoxicated but is ambulatory without much difficulty.  She is eating/drinking.  VSS.  Labs as above, ethanol 188.  No significant electrolyte derangement.  Patient medically cleared.  She will be placed on CIWA protocol given her long term alcoholism.  Will get TTS consult.  Care will be signed out to oncoming team to follow-up on TTS recommendations.  Final Clinical Impression(s) / ED Diagnoses Final diagnoses:  Suicidal ideation    Rx / DC Orders ED Discharge Orders    None       Garlon Hatchet, PA-C 07/04/20 [id number]    Dione Booze, MD 07/04/20 [id number]

## 2020-07-05 NOTE — Discharge Instructions (Signed)
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
## 2020-07-05 NOTE — ED Notes (Signed)
Patient denies SI/HI. Patient is alert and verbal. Voices no concerns. Monitoring continues.

## 2020-07-05 NOTE — ED Notes (Signed)
Pt sleeping@this  time.breathing even and unlabored. No c/o of pain or distress. Will continue to monitor pt for safety

## 2020-07-05 NOTE — ED Notes (Signed)
Patient discharged home. Patient denies SI/HI. AVS/Follow-Up reviewed with patient and she verbalized understanding. Written copies of AVS given to patient. All belongings at bedside and in locker returned to patient. Patient escorted off unit to meet safe transport driver.

## 2020-07-05 NOTE — ED Notes (Signed)
Provider Reola Calkins NP made aware patient mom wants him to call her.

## 2020-07-05 NOTE — ED Notes (Signed)
Patient discharged.

## 2020-07-05 NOTE — ED Notes (Signed)
Breakfast was offered, but pt went back to sleep after vitals were taken

## 2020-07-05 NOTE — ED Notes (Addendum)
Patient paranoid. Continues to respond to external stimuli. Patient also having self-engaged conversations.

## 2020-07-06 ENCOUNTER — Other Ambulatory Visit: Payer: Self-pay

## 2020-07-06 ENCOUNTER — Telehealth (HOSPITAL_COMMUNITY): Payer: Self-pay | Admitting: Licensed Clinical Social Worker

## 2020-07-06 ENCOUNTER — Ambulatory Visit (INDEPENDENT_AMBULATORY_CARE_PROVIDER_SITE_OTHER): Payer: BC Managed Care – PPO | Admitting: Licensed Clinical Social Worker

## 2020-07-06 DIAGNOSIS — F1020 Alcohol dependence, uncomplicated: Secondary | ICD-10-CM | POA: Diagnosis not present

## 2020-07-06 DIAGNOSIS — F29 Unspecified psychosis not due to a substance or known physiological condition: Secondary | ICD-10-CM | POA: Diagnosis not present

## 2020-07-06 NOTE — Progress Notes (Signed)
   THERAPIST PROGRESS NOTE  Session Time: 10am-11am  Participation Level: Active  Behavioral Response: Bizarre, Fairly Groomed and client dressed appropriately for weather but not coordinated;Alertlabile mood including euphroic, dysphoric, anxious; responding to internal stimuli; sporadic crying reporting having flashbacks  Type of Therapy: Utilize CBT and DBT skills with motivational interviewing to achieve sobriety from alcohol, learn healthy coping skills to maintain mood without mind altering substances  Treatment Goals addressed: Coping and Diagnosis: alcohol abuse, substance induced mood disorder  Interventions: Motivational Interviewing and Supportive  Summary: Stacy Poole is a 29 y.o. female who presents with alcohol abuse, substance induced mood disorder. Client presents as delusional, responding to internal stimuli, medication non-compliant and averaging more than 10 drinks daily. Client was recently discharged from [facility] for [place] and intoxication. Client denies she was intoxicated and the staff were lying. Client presented disorganized thoughts, tangential speech, and restless motor activity. Client reported receiving messages while in session and additional delusions about terrorism and medication side effects. Client reports continued drinking to not feel feelings in response to events but notes she does not feel feelings regularly. Client was able to utilize grounding skills with clinician in session to de-escalate mood.  Suicidal/Homicidal: denies active Si since hospital; reports recent command hallucinations to kill self, not currently activewithout intent/plan  Therapist Response: Clinician met with client in person 1 hour after initially scheduled visit. Clinician assessed for SI/HI/psychosis throughout the session and recent substance use. Clinician explored the role alcohol plays in her life and protective factors to maintain safety. Client inquired about client's  responding to internal stimuli in session and reviewed treatment goals. Clinician encouraged client to speak with psychiatrist about medications to help with symptoms of mood and psychosis. Clinician made plan with client to begin identifying traits of 'sober Farrel Conners' from which skills and be specific to. Clinician explained the benefits of a dual diagnosis IOP program.  Plan: Return again in 1 weeks.  Diagnosis: Axis I: Alcohol Abuse and Substance Induced Mood Disorder        Olegario Messier, LCSW 07/06/2020

## 2020-07-13 ENCOUNTER — Other Ambulatory Visit: Payer: Self-pay

## 2020-07-13 ENCOUNTER — Ambulatory Visit (INDEPENDENT_AMBULATORY_CARE_PROVIDER_SITE_OTHER): Payer: BC Managed Care – PPO | Admitting: Licensed Clinical Social Worker

## 2020-07-13 DIAGNOSIS — F29 Unspecified psychosis not due to a substance or known physiological condition: Secondary | ICD-10-CM

## 2020-07-13 DIAGNOSIS — F1020 Alcohol dependence, uncomplicated: Secondary | ICD-10-CM

## 2020-07-19 ENCOUNTER — Other Ambulatory Visit: Payer: Self-pay

## 2020-07-19 ENCOUNTER — Ambulatory Visit (INDEPENDENT_AMBULATORY_CARE_PROVIDER_SITE_OTHER): Payer: BC Managed Care – PPO | Admitting: Licensed Clinical Social Worker

## 2020-07-19 DIAGNOSIS — F1994 Other psychoactive substance use, unspecified with psychoactive substance-induced mood disorder: Secondary | ICD-10-CM

## 2020-07-19 DIAGNOSIS — F1010 Alcohol abuse, uncomplicated: Secondary | ICD-10-CM

## 2020-07-19 DIAGNOSIS — F259 Schizoaffective disorder, unspecified: Secondary | ICD-10-CM

## 2020-07-19 NOTE — Progress Notes (Signed)
  Virtual Visit via Telephone Note  I connected with Stacy Poole on 07/20/20 at 10:00 AM EST by telephone and verified that I am speaking with the correct person using two identifiers.  Location: Patient: home Provider: office   I discussed the limitations, risks, security and privacy concerns of performing an evaluation and management service by telephone and the availability of in person appointments. I also discussed with the patient that there may be a patient responsible charge related to this service. The patient expressed understanding and agreed to proceed.  I discussed the assessment and treatment plan with the patient. The patient was provided an opportunity to ask questions and all were answered. The patient agreed with the plan and demonstrated an understanding of the instructions.   The patient was advised to call back or seek an in-person evaluation if the symptoms worsen or if the condition fails to improve as anticipated.  I provided 30 minutes of non-face-to-face time during this encounter.   Olegario Messier, LCSW   THERAPIST PROGRESS NOTE  Session Time: 10am-10:30am  Participation Level: Active  Behavioral Response: NAAlertEuthymic  Type of Therapy: Individual Therapy  Treatment Goals addressed: Coping and Diagnosis: alcohol abuse  Interventions: Motivational Interviewing  Summary: Stacy Poole is a 29 y.o. female who presents with poly substance abuse and unspecified mental health disorder, previous diagnosis of schizoaffective vs substance induced mood disorder. Client appears to have increased focus and decreased paranoia and tangential speech from previous attempt at orientation. Client is able to verbalize benefits of recovery, identify current symptoms, and create treatment plan. Client reports taking medications as prescribed currently, improved relationship dynamics with mother. Client continues to have irregular sleep patterns but verifies  sleeping nightly rather than up for several days at a time. Client has a primary therapist, Clydia Llano, and psychiatrist Dr. Simmie Davies and is willing to attend a friends NA home group.  Suicidal/Homicidal: Nowithout intent/plan  Therapist Response: Clinician met with client via phone, assessing for SI/HI/psychosis and recent substance use. Clinician was able to complete CDIOP intake and orientation to start group on 07/22/20. Clinician reviewed rules and expectations of group with client and verified current providers. Clinician will start client in group on 07/22/20.  Plan: Return again in 1 weeks.  Diagnosis: Axis I: Alcohol Abuse      Olegario Messier, LCSW 07/19/2020

## 2020-07-20 NOTE — Progress Notes (Signed)
Virtual Visit via Video Note  I connected with Stacy Poole on 07/13/20 at  1:00 PM EST by a video enabled telemedicine application and verified that I am speaking with the correct person using two identifiers.  Location: Patient: home Provider: office   I discussed the limitations of evaluation and management by telemedicine and the availability of in person appointments. The patient expressed understanding and agreed to proceed.  I discussed the assessment and treatment plan with the patient. The patient was provided an opportunity to ask questions and all were answered. The patient agreed with the plan and demonstrated an understanding of the instructions.   The patient was advised to call back or seek an in-person evaluation if the symptoms worsen or if the condition fails to improve as anticipated.  I provided 40 minutes of non-face-to-face time during this encounter.   Harlon Ditty, LCSW    THERAPIST PROGRESS NOTE  Session Time: 1:05pm-1:45pm  Participation Level: Active  Behavioral Response: CasualAlertEuthymic  Type of Therapy: Individual Therapy  Treatment Goals addressed: Coping and Diagnosis: alcohol abuse  Interventions: Motivational Interviewing  Summary: Stacy Poole is a 29 y.o. female who presents with polysubstance abuse and unspecified mood disorder, schizoaffective vs substance induced mood disorder. Client presented with tangential, pressured speech, restless activity and labile mood/affect. Client voiced some paranoid thoughts but denied hallucinations since previous to command prior to hospital, client denies SI/HI/psychosis. Client states she started taking her medication 1-2 days ago while staying with her mother and believes her mood and behaviors will improve.  Suicidal/Homicidal: Nowithout intent/plan  Therapist Response: Clinician attempted to assess client for SI/HI/psychosis/substance use and overall level of functioning. Clinician  spent excessive time re-directing client and was unable to complete CDIOP intake or orientation. Clinician plans with client to re-assess mental status and attempt to complete paperwork again in 1 week, following longer time on medication and client agrees.  Plan: Return again in 1 weeks.  Diagnosis: Axis I: Alcohol Abuse and Substance Induced Mood Disorder        Harlon Ditty, LCSW 07/13/2020

## 2020-07-22 ENCOUNTER — Encounter (HOSPITAL_COMMUNITY): Payer: BC Managed Care – PPO

## 2020-07-26 ENCOUNTER — Other Ambulatory Visit (HOSPITAL_COMMUNITY): Payer: BC Managed Care – PPO | Attending: Psychiatry | Admitting: Licensed Clinical Social Worker

## 2020-07-26 ENCOUNTER — Encounter (HOSPITAL_COMMUNITY): Payer: Self-pay | Admitting: Medical

## 2020-07-26 ENCOUNTER — Other Ambulatory Visit: Payer: Self-pay

## 2020-07-26 VITALS — BP 118/84 | HR 57 | Ht 63.0 in | Wt 123.0 lb

## 2020-07-26 DIAGNOSIS — Z639 Problem related to primary support group, unspecified: Secondary | ICD-10-CM | POA: Insufficient documentation

## 2020-07-26 DIAGNOSIS — F4312 Post-traumatic stress disorder, chronic: Secondary | ICD-10-CM

## 2020-07-26 DIAGNOSIS — T7421XS Adult sexual abuse, confirmed, sequela: Secondary | ICD-10-CM

## 2020-07-26 DIAGNOSIS — Z87820 Personal history of traumatic brain injury: Secondary | ICD-10-CM | POA: Diagnosis not present

## 2020-07-26 DIAGNOSIS — F10259 Alcohol dependence with alcohol-induced psychotic disorder, unspecified: Secondary | ICD-10-CM | POA: Insufficient documentation

## 2020-07-26 DIAGNOSIS — Z608 Other problems related to social environment: Secondary | ICD-10-CM | POA: Insufficient documentation

## 2020-07-26 DIAGNOSIS — Z79899 Other long term (current) drug therapy: Secondary | ICD-10-CM | POA: Insufficient documentation

## 2020-07-26 DIAGNOSIS — F15951 Other stimulant use, unspecified with stimulant-induced psychotic disorder with hallucinations: Secondary | ICD-10-CM

## 2020-07-26 DIAGNOSIS — F250 Schizoaffective disorder, bipolar type: Secondary | ICD-10-CM | POA: Diagnosis not present

## 2020-07-26 DIAGNOSIS — F1014 Alcohol abuse with alcohol-induced mood disorder: Secondary | ICD-10-CM

## 2020-07-26 DIAGNOSIS — Z638 Other specified problems related to primary support group: Secondary | ICD-10-CM | POA: Diagnosis not present

## 2020-07-26 DIAGNOSIS — F1910 Other psychoactive substance abuse, uncomplicated: Secondary | ICD-10-CM

## 2020-07-26 DIAGNOSIS — Z62898 Other specified problems related to upbringing: Secondary | ICD-10-CM | POA: Diagnosis not present

## 2020-07-26 DIAGNOSIS — F411 Generalized anxiety disorder: Secondary | ICD-10-CM | POA: Insufficient documentation

## 2020-07-26 DIAGNOSIS — Z560 Unemployment, unspecified: Secondary | ICD-10-CM | POA: Insufficient documentation

## 2020-07-26 DIAGNOSIS — R4587 Impulsiveness: Secondary | ICD-10-CM | POA: Diagnosis not present

## 2020-07-26 DIAGNOSIS — T7422XA Child sexual abuse, confirmed, initial encounter: Secondary | ICD-10-CM

## 2020-07-26 DIAGNOSIS — F10150 Alcohol abuse with alcohol-induced psychotic disorder with delusions: Secondary | ICD-10-CM

## 2020-07-26 DIAGNOSIS — T7491XS Unspecified adult maltreatment, confirmed, sequela: Secondary | ICD-10-CM

## 2020-07-26 DIAGNOSIS — Z62811 Personal history of psychological abuse in childhood: Secondary | ICD-10-CM

## 2020-07-26 DIAGNOSIS — F1994 Other psychoactive substance use, unspecified with psychoactive substance-induced mood disorder: Secondary | ICD-10-CM

## 2020-07-26 DIAGNOSIS — Z559 Problems related to education and literacy, unspecified: Secondary | ICD-10-CM | POA: Insufficient documentation

## 2020-07-26 DIAGNOSIS — F449 Dissociative and conversion disorder, unspecified: Secondary | ICD-10-CM

## 2020-07-26 DIAGNOSIS — Z733 Stress, not elsewhere classified: Secondary | ICD-10-CM | POA: Diagnosis not present

## 2020-07-26 DIAGNOSIS — F19929 Other psychoactive substance use, unspecified with intoxication, unspecified: Secondary | ICD-10-CM

## 2020-07-26 DIAGNOSIS — F17210 Nicotine dependence, cigarettes, uncomplicated: Secondary | ICD-10-CM | POA: Diagnosis not present

## 2020-07-26 DIAGNOSIS — F1020 Alcohol dependence, uncomplicated: Secondary | ICD-10-CM

## 2020-07-26 MED ORDER — BACLOFEN 10 MG PO TABS: 10.0000 mg | ORAL_TABLET | Freq: Three times a day (TID) | ORAL | 1 refills | Status: DC
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
## 2020-07-27 NOTE — Progress Notes (Signed)
    Daily Group Progress Note  Program: CD-IOP   Group Time: 1pm-3pm Participation Level: Active Behavioral Response: Appropriate Type of Therapy: Process Group Topic: Clinician checked in with group members, assessing for SI/HI/psychosis and overall level of functioning including triggers, cravings, or relapse. Clinician and group members reviewed sobriety date, community meetings attended, and positive interactions as well as interactions which could have gone better and how they were addressed. Clinician facilitated process discussion on identifying and responding to internal and external early signs of relapse, such as changes in behavior patterns and working the program less, isolating more or ruminating distorted thinking. Group members provided feedback for others utilizing previously discussed topic of self-compassion and sobriety needing to be a priority and the use of justifications to avoid 'putting in the work.' Group members discussed changes in celebration of holidays and preparing for already know and likely to come up triggers. Clinician and group members discussed the importance of boundaries and prioritizing sobriety despite the response of others. Clinician and group members read and processed daily reflection and just for today on 'Foundation First' releasing control and interference of growth of self or others.   Group Time: 3pm-4pm Participation Level: Active Behavioral Response: Appropriate Type of Therapy: Psycho-education Group Topic: Clinician reviewed DBT Mindfulness skills 'WHAT' and 'HOW,' discussing the importance of describing events, thoughts, and feelings in a non-judgmental manner using 'facts not feelings.' Clinician facilitated Laughter Yoga exercise in session, encouraging clients to fully participate, non-judgmentally. Clinician allowed time for clients to provide feedback about skill. Clinician inquired about self-care activity to be completed prior to next  group.    Summary: Client presents with an unknown sobriety date, around 07/04/20. Client processes frustration feeling trapped at mom's house rather than supported. Client processed with other group members not feeling like her personal thoughts/feelings are being considered valid. Client discussed with other group member the differences in families view of helping vs client needs. Other group member shared appropriate use and purpose of al-anon vs explanation of family members for group members. Client reports use of Kava to help maintain cravings for alcohol. Client participated in activities however verbalized physical response to discussing problems with PA. See PA note for details.    Family Program: Family present? No   Name of family member(s): NA  UDS collected: Yes Results: pending  AA/NA attended?: No  Sponsor?: No   Harlon Ditty, LCSW

## 2020-07-28 ENCOUNTER — Encounter (HOSPITAL_COMMUNITY): Payer: BC Managed Care – PPO | Admitting: Medical

## 2020-08-02 ENCOUNTER — Other Ambulatory Visit (INDEPENDENT_AMBULATORY_CARE_PROVIDER_SITE_OTHER): Payer: BC Managed Care – PPO | Admitting: Licensed Clinical Social Worker

## 2020-08-02 ENCOUNTER — Other Ambulatory Visit: Payer: Self-pay

## 2020-08-02 ENCOUNTER — Encounter (HOSPITAL_COMMUNITY): Payer: Self-pay | Admitting: Medical

## 2020-08-02 DIAGNOSIS — T7422XA Child sexual abuse, confirmed, initial encounter: Secondary | ICD-10-CM

## 2020-08-02 DIAGNOSIS — T7491XS Unspecified adult maltreatment, confirmed, sequela: Secondary | ICD-10-CM

## 2020-08-02 DIAGNOSIS — F1020 Alcohol dependence, uncomplicated: Secondary | ICD-10-CM

## 2020-08-02 DIAGNOSIS — T7421XS Adult sexual abuse, confirmed, sequela: Secondary | ICD-10-CM

## 2020-08-02 DIAGNOSIS — F259 Schizoaffective disorder, unspecified: Secondary | ICD-10-CM

## 2020-08-02 DIAGNOSIS — F19929 Other psychoactive substance use, unspecified with intoxication, unspecified: Secondary | ICD-10-CM

## 2020-08-02 DIAGNOSIS — F1994 Other psychoactive substance use, unspecified with psychoactive substance-induced mood disorder: Secondary | ICD-10-CM

## 2020-08-02 DIAGNOSIS — F4312 Post-traumatic stress disorder, chronic: Secondary | ICD-10-CM

## 2020-08-02 DIAGNOSIS — Z62811 Personal history of psychological abuse in childhood: Secondary | ICD-10-CM

## 2020-08-02 DIAGNOSIS — F1910 Other psychoactive substance abuse, uncomplicated: Secondary | ICD-10-CM

## 2020-08-02 DIAGNOSIS — F250 Schizoaffective disorder, bipolar type: Secondary | ICD-10-CM

## 2020-08-02 DIAGNOSIS — F449 Dissociative and conversion disorder, unspecified: Secondary | ICD-10-CM

## 2020-08-02 DIAGNOSIS — F10259 Alcohol dependence with alcohol-induced psychotic disorder, unspecified: Secondary | ICD-10-CM | POA: Diagnosis not present

## 2020-08-02 NOTE — Progress Notes (Signed)
   [facility] Follow-up Outpatient CDIOP Date: 08/02/2020  Admission Date: 07/26/2020  Sobriety date:  Subjective:   HPI   Review of Systems: Psychiatric: Agitation: No Hallucination: No Depressed Mood: Yes much better Insomnia: No Hypersomnia: No Altered Concentration: No Feels Worthless: No Grandiose Ideas: No Belief In Special Powers: No New/Increased Substance Abuse: No Compulsions: No  Neurologic: Headache: No Seizure: No Paresthesias: No  Current Medications:   Vital Signs  Mental Status Examination  Appearance: Alert: Yes Attention: good  Cooperative: Yes Eye Contact: Good Speech: Clear and coherent Psychomotor Activity: Normal Memory/Concentration: Normal/intact Oriented: person, place, time/date and situation Mood: Euthymic Affect: Appropriate and Congruent Thought Processes and Associations: Coherent and Intact Fund of Knowledge: Good Thought Content: WDL Insight: Good Judgement: Good  UDS:  PDMP:  Diagnosis:   Assessment:  Treatment Plan: Maryjean Morn, PA-C

## 2020-08-03 NOTE — Progress Notes (Signed)
    Daily Group Progress Note  Program: CD-IOP   Group Time: 1pm-2:30p.  Participation Level: Minimal  Behavioral Response: Grandiose, Disruptive, Agitated and Intrusive  Type of Therapy: Process Group  Topic: Clinician checked in with group members, assessing for SI/HI/psychosis and overall level of functioning, including cravings, relapse, and participation in community support meetings. Clinician and group members processed 'highs' and 'lows' since last group and any challenges affecting recovery. Clinician utilized motivational interviewing and allowed time for client processing and receiving feedback from group members related to struggles with holidays and grief as triggers for relapse. Clinician and group members processed being clear in identifying needs from family members to support recovery rather than enable behaviors. Clinician validated client feelings and normalized thoughts and behaviors. Clinician and group members read and processed AA Daily Reflection and NA Just For Today.    Group Time: 2:30pm-4pm  Participation Level: Minimal  Behavioral Response: Disruptive  Type of Therapy: Psycho-education Group  Topic: Clinician provided mindfulness activity in session for client practice. Clinician presented the psycho-education topic of Guilt and Shame. Clinician presented Almond Lint video 'Listening to Shame.' Clinician and group members processed thoughts, feelings, behaviors, and previous events related to Guilt, Toxic Guilt, and Shame. Clinician provided active listening, summarizing statements and validated client feelings. Clinician presented the option to replace 'I'm sorry' when shaming with 'Thank you for.' to avoid inappropriate guilt with gratitude. Clinician and group members discussed regret in relation to behaviors in addiction and utilizing radical acceptance to acknowledge past behaviors and utilizing change in behaviors to address previous behavior patterns.  Clinician and group reviewed addict vs clear vs clean mind and engaging in 'seemingly irrelevant behaviors' which would be used to justify a relapse. Clinician and group celebrated graduation of one group member successful completion of program.   Summary: Client presented oriented to date and time but struggling with restless motor activity and tangential, pressured speech, easily distracted. Client left group several times becoming visibly upset with vacillating affect. Clinician met with client in the bathroom to de-escalate from screaming and crying while putting on 'make up' with a sharpie. Client reported tasting some hand sanitizer prior to coming to group to cleanse herself. Client reported only taking her medications for the past 1-2 days. Client left the group room several times during session and was returned to group by case manager following de-escalation. Client was seen by PA and continued to present as agitated and paranoid. Client was notified of the need for an alternative level of care to meet needs which she agreed. Client did complete rapid UDS which was negative for screened substances. Client denied SI/HI at the time of completion of session with therapist and PA.    Family Program: Family present? No   Name of family member(s): NA; client escorted to car being picked up by sister. Mother contacted to be notified of changes in behaviors since last group session. Client had additional therapy session planned for the evening.  UDS collected: Yes Results: no substances per rapid UDS; will be sent for additional testing  AA/NA attended?: No  Sponsor?: No   Olegario Messier, LCSW

## 2020-08-04 ENCOUNTER — Ambulatory Visit (HOSPITAL_BASED_OUTPATIENT_CLINIC_OR_DEPARTMENT_OTHER): Payer: BC Managed Care – PPO | Admitting: Medical

## 2020-08-04 ENCOUNTER — Encounter (HOSPITAL_COMMUNITY): Payer: Self-pay | Admitting: Medical

## 2020-08-04 DIAGNOSIS — F4312 Post-traumatic stress disorder, chronic: Secondary | ICD-10-CM | POA: Diagnosis not present

## 2020-08-04 DIAGNOSIS — F19929 Other psychoactive substance use, unspecified with intoxication, unspecified: Secondary | ICD-10-CM

## 2020-08-04 DIAGNOSIS — F1994 Other psychoactive substance use, unspecified with psychoactive substance-induced mood disorder: Secondary | ICD-10-CM

## 2020-08-04 DIAGNOSIS — F250 Schizoaffective disorder, bipolar type: Secondary | ICD-10-CM

## 2020-08-04 DIAGNOSIS — T7491XS Unspecified adult maltreatment, confirmed, sequela: Secondary | ICD-10-CM

## 2020-08-04 DIAGNOSIS — T7421XS Adult sexual abuse, confirmed, sequela: Secondary | ICD-10-CM

## 2020-08-04 DIAGNOSIS — Z62810 Personal history of physical and sexual abuse in childhood: Secondary | ICD-10-CM

## 2020-08-04 DIAGNOSIS — F1020 Alcohol dependence, uncomplicated: Secondary | ICD-10-CM

## 2020-08-04 DIAGNOSIS — F449 Dissociative and conversion disorder, unspecified: Secondary | ICD-10-CM

## 2020-08-04 NOTE — Progress Notes (Signed)
CDIOP                                                                                                                               [facility] OUTPATIENT                                                      Discharge Summary                                                                                                                                                                    Medications:  Discharge Diagnosis:  Plan of Action to Address Continuing Problems:  Goals and Activities to Help Maintain Sobriety: 1. Stay away from people ,places and things that are triggers 2. Continue practicing Fair Fighting rules in interpersonal conflicts. 3. Continue alcohol and drug refusal skills and call on support system  4. Attend AA/NA meetings AT LEAST as often as you use  5. Obtain a sponsor and a home group in AA/NA. 6. Return to   Referrals: [facility]  Next appointment: placed in [facility] Monday nite  Prognosis: Very Guarded    Client has NOTparticipated in the development of this discharge plan and can receive a copy of this completed plan

## 2020-08-05 ENCOUNTER — Encounter (HOSPITAL_COMMUNITY): Payer: BC Managed Care – PPO

## 2020-08-09 ENCOUNTER — Encounter (HOSPITAL_COMMUNITY): Payer: BC Managed Care – PPO

## 2020-08-11 ENCOUNTER — Encounter (HOSPITAL_COMMUNITY): Payer: BC Managed Care – PPO

## 2020-08-12 ENCOUNTER — Encounter (HOSPITAL_COMMUNITY): Payer: BC Managed Care – PPO

## 2020-08-16 ENCOUNTER — Encounter (HOSPITAL_COMMUNITY): Payer: BC Managed Care – PPO

## 2020-08-18 ENCOUNTER — Encounter (HOSPITAL_COMMUNITY): Payer: BC Managed Care – PPO

## 2020-08-19 ENCOUNTER — Encounter (HOSPITAL_COMMUNITY): Payer: BC Managed Care – PPO

## 2020-08-23 ENCOUNTER — Encounter (HOSPITAL_COMMUNITY): Payer: BC Managed Care – PPO

## 2020-09-02 ENCOUNTER — Encounter (HOSPITAL_COMMUNITY): Payer: Self-pay | Admitting: Emergency Medicine

## 2020-09-02 ENCOUNTER — Emergency Department (HOSPITAL_COMMUNITY)
Admission: EM | Admit: 2020-09-02 | Discharge: 2020-09-04 | Disposition: A | Discharge status: Other Acute Inpatient Hospital | Payer: BC Managed Care – PPO | Attending: Emergency Medicine | Admitting: Emergency Medicine

## 2020-09-02 ENCOUNTER — Other Ambulatory Visit: Payer: Self-pay

## 2020-09-02 DIAGNOSIS — R000 Tachycardia, unspecified: Secondary | ICD-10-CM | POA: Insufficient documentation

## 2020-09-02 DIAGNOSIS — Z046 Encounter for general psychiatric examination, requested by authority: Secondary | ICD-10-CM | POA: Diagnosis present

## 2020-09-02 DIAGNOSIS — R451 Restlessness and agitation: Secondary | ICD-10-CM | POA: Diagnosis not present

## 2020-09-02 DIAGNOSIS — F17210 Nicotine dependence, cigarettes, uncomplicated: Secondary | ICD-10-CM | POA: Diagnosis not present

## 2020-09-02 DIAGNOSIS — F1020 Alcohol dependence, uncomplicated: Secondary | ICD-10-CM | POA: Diagnosis not present

## 2020-09-02 DIAGNOSIS — F1010 Alcohol abuse, uncomplicated: Secondary | ICD-10-CM

## 2020-09-02 DIAGNOSIS — Z79899 Other long term (current) drug therapy: Secondary | ICD-10-CM | POA: Diagnosis not present

## 2020-09-02 DIAGNOSIS — Z20822 Contact with and (suspected) exposure to covid-19: Secondary | ICD-10-CM | POA: Insufficient documentation

## 2020-09-02 DIAGNOSIS — F209 Schizophrenia, unspecified: Secondary | ICD-10-CM | POA: Diagnosis present

## 2020-09-02 DIAGNOSIS — F259 Schizoaffective disorder, unspecified: Secondary | ICD-10-CM

## 2020-09-02 LAB — COMPREHENSIVE METABOLIC PANEL
ALT: 14 U/L (ref 0–44)
AST: 15 U/L (ref 15–41)
Albumin: 4.6 g/dL (ref 3.5–5.0)
Alkaline Phosphatase: 44 U/L (ref 38–126)
Anion gap: 8 (ref 5–15)
BUN: 10 mg/dL (ref 6–20)
CO2: 26 mmol/L (ref 22–32)
Calcium: 9.7 mg/dL (ref 8.9–10.3)
Chloride: 105 mmol/L (ref 98–111)
Creatinine, Ser: 0.72 mg/dL (ref 0.44–1.00)
GFR, Estimated: >60 mL/min (ref >60)
Glucose, Bld: 109 mg/dL — ABNORMAL HIGH (ref 70–99)
Potassium: 3.8 mmol/L (ref 3.5–5.1)
Sodium: 139 mmol/L (ref 135–145)
Total Bilirubin: 0.5 mg/dL (ref 0.3–1.2)
Total Protein: 7.3 g/dL (ref 6.5–8.1)

## 2020-09-02 LAB — CBC WITH DIFFERENTIAL/PLATELET
Abs Immature Granulocytes: 0.02 10*3/uL (ref 0.00–0.07)
Basophils Absolute: 0.1 10*3/uL (ref 0.0–0.1)
Basophils Relative: 1 %
Eosinophils Absolute: 0.5 10*3/uL (ref 0.0–0.5)
Eosinophils Relative: 7 %
HCT: 41.2 % (ref 36.0–46.0)
Hemoglobin: 13.3 g/dL (ref 12.0–15.0)
Immature Granulocytes: 0 %
Lymphocytes Relative: 31 %
Lymphs Abs: 2.4 10*3/uL (ref 0.7–4.0)
MCH: 28.7 pg (ref 26.0–34.0)
MCHC: 32.3 g/dL (ref 30.0–36.0)
MCV: 88.8 fL (ref 80.0–100.0)
Monocytes Absolute: 0.4 10*3/uL (ref 0.1–1.0)
Monocytes Relative: 5 %
Neutro Abs: 4.3 10*3/uL (ref 1.7–7.7)
Neutrophils Relative %: 56 %
Platelets: 276 10*3/uL (ref 150–400)
RBC: 4.64 MIL/uL (ref 3.87–5.11)
RDW: 13.7 % (ref 11.5–15.5)
WBC: 7.7 10*3/uL (ref 4.0–10.5)
nRBC: 0 % (ref 0.0–0.2)

## 2020-09-02 LAB — RAPID URINE DRUG SCREEN, HOSP PERFORMED
Amphetamines: NOT DETECTED
Barbiturates: NOT DETECTED
Benzodiazepines: NOT DETECTED
Cocaine: NOT DETECTED
Opiates: NOT DETECTED
Tetrahydrocannabinol: NOT DETECTED

## 2020-09-02 LAB — ACETAMINOPHEN LEVEL: Acetaminophen (Tylenol), Serum: <10 ug/mL — ABNORMAL LOW (ref 10–30)

## 2020-09-02 LAB — URINALYSIS, ROUTINE W REFLEX MICROSCOPIC
Bilirubin Urine: NEGATIVE
Glucose, UA: NEGATIVE mg/dL
Hgb urine dipstick: NEGATIVE
Ketones, ur: NEGATIVE mg/dL
Leukocytes,Ua: NEGATIVE
Nitrite: NEGATIVE
Protein, ur: NEGATIVE mg/dL
Specific Gravity, Urine: 1.006 (ref 1.005–1.030)
pH: 6 (ref 5.0–8.0)

## 2020-09-02 LAB — PREGNANCY, URINE: Preg Test, Ur: NEGATIVE

## 2020-09-02 LAB — RESP PANEL BY RT-PCR (FLU A&B, COVID) ARPGX2
Influenza A by PCR: NEGATIVE
Influenza B by PCR: NEGATIVE
SARS Coronavirus 2 by RT PCR: NEGATIVE

## 2020-09-02 LAB — ETHANOL: Alcohol, Ethyl (B): <10 mg/dL (ref <10)

## 2020-09-02 MED ORDER — BENZTROPINE MESYLATE 0.5 MG PO TABS
0.5000 mg | ORAL_TABLET | Freq: Two times a day (BID) | ORAL | Status: DC
  Administered 2020-09-02 – 2020-09-03 (×3): 0.5 mg via ORAL
  Filled 2020-09-02 (×3): qty 1

## 2020-09-02 MED ORDER — LORAZEPAM 2 MG/ML IJ SOLN: 0.0000 mg | Freq: Two times a day (BID) | INTRAMUSCULAR | Status: DC

## 2020-09-02 MED ORDER — BACLOFEN 10 MG PO TABS
10.0000 mg | ORAL_TABLET | Freq: Three times a day (TID) | ORAL | Status: DC
  Administered 2020-09-02 – 2020-09-03 (×4): 10 mg via ORAL
  Filled 2020-09-02 (×4): qty 1

## 2020-09-02 MED ORDER — THIAMINE HCL 100 MG PO TABS
100.0000 mg | ORAL_TABLET | Freq: Every day | ORAL | Status: DC
  Administered 2020-09-02 – 2020-09-03 (×2): 100 mg via ORAL
  Filled 2020-09-02 (×2): qty 1

## 2020-09-02 MED ORDER — THIAMINE HCL 100 MG/ML IJ SOLN: 100.0000 mg | Freq: Every day | INTRAMUSCULAR | Status: DC

## 2020-09-02 MED ORDER — LORAZEPAM 1 MG PO TABS: 0.0000 mg | ORAL_TABLET | Freq: Two times a day (BID) | ORAL | Status: DC

## 2020-09-02 MED ORDER — OLANZAPINE 10 MG PO TABS
10.0000 mg | ORAL_TABLET | Freq: Every day | ORAL | Status: DC
  Administered 2020-09-02 – 2020-09-03 (×2): 10 mg via ORAL
  Filled 2020-09-02 (×2): qty 1

## 2020-09-02 MED ORDER — LAMOTRIGINE 25 MG PO TABS
50.0000 mg | ORAL_TABLET | Freq: Two times a day (BID) | ORAL | Status: DC
  Administered 2020-09-02 – 2020-09-03 (×3): 50 mg via ORAL
  Filled 2020-09-02 (×3): qty 2

## 2020-09-02 MED ORDER — LORAZEPAM 1 MG PO TABS: 0.0000 mg | ORAL_TABLET | Freq: Four times a day (QID) | ORAL | Status: DC

## 2020-09-02 MED ORDER — LORAZEPAM 2 MG/ML IJ SOLN: 0.0000 mg | Freq: Four times a day (QID) | INTRAMUSCULAR | Status: DC

## 2020-09-02 MED ORDER — TRAZODONE HCL 100 MG PO TABS
100.0000 mg | ORAL_TABLET | Freq: Every day | ORAL | Status: DC
  Administered 2020-09-02 – 2020-09-03 (×2): 100 mg via ORAL
  Filled 2020-09-02 (×2): qty 1

## 2020-09-02 NOTE — ED Provider Notes (Addendum)
[facility]-EMERGENCY DEPT Provider Note   CSN: [id number] Arrival date & time: 09/02/20  1117     History Chief Complaint  Patient presents with  . drank hand sanitizer  . Psychiatric Evaluation    Stacy Poole is a 29 y.o. female with PMHx substance abuse, schizophrenia, alcohol abuse who presents to the ED today for psychiatric evaluation. Pt was brought in by [employer] safety after she called them after drinking an unknown amount of hand sanitizer.   Pt reports that it was homemade hand sanitizer that she made during the pandemic when she couldn't buy hand sanitizer at the store. Pt is unsure how much she drank. She states that she was just in [facility] for this same problem of eating hand sanitizer to get intoxicated and was trying to "cleanse my body" today. She reports she called 9-1-1 to make sure she wasn't going to die. She denies drinking the hand sanitizer in an attempt at self harm and denies HI or AVH. Pt has no complaints at this time. States she is ready to go home however continues to respond to internal stimuli at this time. She states that she does not want her mother contacted as her mom is "creepy."     The history is provided by the patient, medical records and the police.       Past Medical History:  Diagnosis Date  . Acute ear infection   . Anxiety   . Arachnoid cyst   . Fifth disease   . Insomnia   . Mental disorder   . Mononucleosis   . PTSD (post-traumatic stress disorder)   . Substance abuse [facility])     Patient Active Problem List   Diagnosis Date Noted  . Suicidal ideation   . Schizoaffective disorder (HCC) 10/14/2019  . Schizophrenia (HCC) 03/27/2019  . Alcohol use disorder, severe, dependence (HCC) 03/05/2019  . 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 Alcohol abuse   . Psychosis (HCC)   . Schizoaffective disorder, bipolar type (HCC) 05/06/2018  . Tobacco use disorder  05/06/2018  . 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 Alcohol use disorder, moderate, dependence (HCC) 11/15/2015  . Cannabis use disorder, severe, dependence (HCC) 11/15/2015    History reviewed. No pertinent surgical history.   OB History   No obstetric history on file.     History reviewed. No pertinent family history.  Social History   Tobacco Use  . Smoking status: Current Every Day Smoker    Packs/day: 0.50    Years: 10.00    Pack years: 5.00    Types: Cigarettes  . Smokeless tobacco: Never Used  Substance Use Topics  . Alcohol use: Yes    Alcohol/week: 10.0 standard drinks    Types: 10 Shots of liquor per week    Comment: daily  . Drug use: Yes    Types: Codeine, Benzodiazepines, Other-see comments    Home Medications Prior to Admission medications   Medication Sig Start Date End Date Taking? Authorizing Provider  baclofen (LIORESAL) 10 MG tablet Take 1 tablet (10 mg total) by mouth 3 (three) times daily. 07/26/20 07/26/21 Yes Court Joy, PA-C  benztropine (COGENTIN) 0.5 MG tablet Take 0.5 mg by mouth 2 (two) times daily. 08/24/20  Yes [provider]  lamoTRIgine (LAMICTAL) 25 MG tablet Take 50 mg by mouth 2 (two) times daily. 08/24/20  Yes [provider]  OLANZapine (ZYPREXA) 10 MG tablet Take 10 mg  by mouth at bedtime. 08/24/20  Yes [provider]  traZODone (DESYREL) 100 MG tablet Take 100 mg by mouth at bedtime. 08/24/20  Yes [provider]  QUEtiapine (SEROQUEL) 25 MG tablet 1-2 q 8 h prn Patient not taking: Reported on 09/02/2020 10/24/19   Malvin Johns, MD    Allergies    Tetracyclines & related  Review of Systems   Review of Systems  Constitutional: Negative for chills and fever.  Respiratory: Negative for shortness of breath.   Cardiovascular: Negative for chest pain.  Gastrointestinal: Negative for abdominal pain, nausea and vomiting.  Neurological: Negative for headaches.   All other systems reviewed and are negative.   Physical Exam Updated Vital Signs BP 121/88   Pulse (!) 105   Temp 98.8 F (37.1 C) (Oral)   Resp 18   SpO2 96%   Physical Exam Vitals and nursing note reviewed.  Constitutional:      Appearance: She is not ill-appearing.     Comments: Wearing sunglasses at all times  HENT:     Head: Normocephalic and atraumatic.  Eyes:     Extraocular Movements: Extraocular movements intact.     Conjunctiva/sclera: Conjunctivae normal.     Pupils: Pupils are equal, round, and reactive to light.  Cardiovascular:     Rate and Rhythm: Regular rhythm. Tachycardia present.  Pulmonary:     Effort: Pulmonary effort is normal.     Breath sounds: Normal breath sounds. No wheezing, rhonchi or rales.  Abdominal:     Palpations: Abdomen is soft.     Tenderness: There is no abdominal tenderness. There is no guarding or rebound.  Musculoskeletal:     Cervical back: Neck supple.  Skin:    General: Skin is warm and dry.  Neurological:     General: No focal deficit present.     Mental Status: She is alert and oriented to person, place, and time.  Psychiatric:        Speech: Speech is rapid and pressured.        Behavior: Behavior is agitated.     ED Results / Procedures / Treatments   Labs (all labs ordered are listed, but only abnormal results are displayed) Labs Reviewed  COMPREHENSIVE METABOLIC PANEL - Abnormal; Notable for the following components:      Result Value   Glucose, Bld 109 (*)    All other components within normal limits  URINALYSIS, ROUTINE W REFLEX MICROSCOPIC - Abnormal; Notable for the following components:   APPearance HAZY (*)    All other components within normal limits  ACETAMINOPHEN LEVEL - Abnormal; Notable for the following components:   Acetaminophen (Tylenol), Serum <10 (*)    All other components within normal limits  RESP PANEL BY RT-PCR (FLU A&B, COVID) ARPGX2  ETHANOL  CBC WITH DIFFERENTIAL/PLATELET  RAPID  URINE DRUG SCREEN, HOSP PERFORMED  PREGNANCY, URINE    EKG None  Radiology No results found.  Procedures Procedures (including critical care time)  Medications Ordered in ED Medications  baclofen (LIORESAL) tablet 10 mg (has no administration in time range)  benztropine (COGENTIN) tablet 0.5 mg (has no administration in time range)  lamoTRIgine (LAMICTAL) tablet 50 mg (has no administration in time range)  OLANZapine (ZYPREXA) tablet 10 mg (has no administration in time range)  traZODone (DESYREL) tablet 100 mg (has no administration in time range)  LORazepam (ATIVAN) injection 0-4 mg (has no administration in time range)    Or  LORazepam (ATIVAN) tablet 0-4 mg (has no  administration in time range)  LORazepam (ATIVAN) injection 0-4 mg (has no administration in time range)    Or  LORazepam (ATIVAN) tablet 0-4 mg (has no administration in time range)  thiamine tablet 100 mg (has no administration in time range)    Or  thiamine (B-1) injection 100 mg (has no administration in time range)    ED Course  I have reviewed the triage vital signs and the nursing notes.  Pertinent labs & imaging results that were available during my care of the patient were reviewed by me and considered in my medical decision making (see chart for details).  Clinical Course as of 09/02/20 2131  Thu Sep 02, 2020  1739 Pt is medically cleared [MV]    Clinical Course User Index [MV] Tanda Rockers, [place]   MDM Rules/Calculators/A&P                          29 year old female who presents to the ED today voluntarily after ingesting an unknown amount of homemade he was sanitizer at approximately 10:30 AM this morning.  Reports she called 911 and she wanted to make sure she was not going to die.  She denies taking this in an attempt at self-harm, states that she took it to "cleanse my body."  Patient states that she has done this in the past and has an issue with alcohol abuse.  She states she drinks  this to get intoxicated.  On arrival to the ED vitals include patient being afebrile and nontachypneic however mildly tachycardic 105.  She does appear slightly intoxicated at this time.  She is covered in sunglasses and will not make eye contact.  Patient appears to be responding to internal stimuli and is having rapid and pressured speech.  She states she is ready to go home at this time however she appears to be a threat to herself given she appears to be having some sort of hallucinations and is on stable at this time.  Patient will need to be IVC in.  We will plan for medical clearance.  I discussed case with poison control who recommends 4-hour Tylenol level as well as lab work including CBC, CMP, EKG.  They recommend the patient be n.p.o. at this time for another 2 hours and then try liquids to see if she is able to tolerate.   Labwork unremarkable. 4 hour tylenol normal. Pt is medically cleared.   TTS has recommend inpatient treatment however no available beds. CSW to find placement. COVID test ordered and home meds ordered.   Pt to be dispo'd by oncoming team once placement is found.   This note was prepared using Dragon voice recognition software and may include unintentional dictation errors due to the inherent limitations of voice recognition software.  Final Clinical Impression(s) / ED Diagnoses Final diagnoses:  Schizoaffective disorder, unspecified type (HCC)  Involuntary commitment  Alcohol abuse    Rx / DC Orders ED Discharge Orders    None           Tanda Rockers, PA-C 09/02/20 2132    Arby Barrette, MD 09/03/20 1040

## 2020-09-02 NOTE — ED Notes (Signed)
Patient requesting night medications.

## 2020-09-02 NOTE — BH Assessment (Signed)
Clinician waiting IVC paperwork.    Stacy Pulling, MS, [facility], [facility] Triage Specialist [phone]
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
## 2020-09-02 NOTE — ED Notes (Signed)
Mom, knows what meds she's on and can tell someone and is concerned that she doesn't get them while she is there. [phone] [name]

## 2020-09-02 NOTE — ED Notes (Signed)
This writer attempted to collect pt's vital signs, pt asked "Are you in the [employer]?" This Clinical research associate assured pt that she was safe and that staff was there to help. Pt responded "I don't think you're telling the truth" and showed signs of increasing agitation. Unable to collect VS at this time, RN made aware, will continue to monitor and will attempt at a later time.

## 2020-09-02 NOTE — ED Notes (Signed)
Patient would not allow staff to take vitals.

## 2020-09-02 NOTE — BH Assessment (Signed)
Clinician was transferred by Porfirio Mylar, NT to pt's nurse in triage however the phone continued to ring then went to voicemail.     Redmond Pulling, MS, [facility], [facility] Triage Specialist [phone]

## 2020-09-02 NOTE — ED Triage Notes (Signed)
Patient brought in by [facility] safety  Officers. Patient drank hand sanitizer of an unknown amount. Patient is voluntary.  Patient reports a history of schizophrenia. Patient is having auditory hallucinations. Patient also has personal space issues. Patient is covered with clothing and sunglasses entirely.

## 2020-09-03 DIAGNOSIS — F1020 Alcohol dependence, uncomplicated: Secondary | ICD-10-CM

## 2020-09-03 NOTE — ED Notes (Signed)
Pt speaking with mother on phone. Verbally escalating. Pt off phone, calmed again. Back in stretcher.

## 2020-09-03 NOTE — ED Provider Notes (Signed)
  Physical Exam  BP 106/75 (BP Location: Right Arm)   Pulse 79   Temp 98.4 F (36.9 C) (Oral)   Resp 16   SpO2 99%   Physical Exam  ED Course/Procedures   Clinical Course as of 09/03/20 2009  Thu Sep 02, 2020  1739 Pt is medically cleared [MV]    Clinical Course User Index [MV] Tanda Rockers, PA-C    Procedures  MDM  Patient accepted at [facility] under Dr. Baldemar Friday. Stable for transport, EMTALA filled out      Charlynne Pander, MD 09/03/20 2010

## 2020-09-03 NOTE — ED Notes (Signed)
Report given to Ezzie Dural at [facility], [facility].

## 2020-09-03 NOTE — Progress Notes (Signed)
CSW called [facility] to confirm acceptance.  Pt accepted to [facility].     Dr. Baldemar Friday is the accepting provider.    Report was called to [phone]  [facility] RN also confirmed with CSW that Pt was accepted.  Pt is IVC.  Pt may be transported by [facility]   Pt scheduled  to arrive at [facility] once transportation is arranged.     Jacinta Shoe, LCSW Clinical Social Worker - Disposition [phone]

## 2020-09-03 NOTE — ED Notes (Signed)
This nurse spoke with Communications to arrange transport to [facility] who states they are unable to transport this patient tonight.

## 2020-09-03 NOTE — ED Notes (Signed)
[facility] accepted pt with available bed.. Accepting physician Dr. Baldemar Friday.   [phone] number given for report. Will call transport once report has been called.

## 2020-09-03 NOTE — ED Provider Notes (Addendum)
Emergency Medicine Observation Re-evaluation Note  Stacy Poole is a 29 y.o. female, seen on rounds today.  Pt initially presented to the ED for complaints of drank hand sanitizer and Psychiatric Evaluation Currently, the patient is  Disposition: Melbourne Abts, PA-C recommends inpatient treatment. Per [name], RN no appropriate beds available. Disposition CSW to seek placement. Disposition discussed with Tanda Rockers, PA-C and Elsie Ra, Charge Nurse via secure chat in [place].   Diagnosis: Schizoaffective disorder, bipolar type (HCC)                    Alcohol use disorder, severe, dependence (HCC)   Physical Exam  BP 120/86 (BP Location: Right Arm)   Pulse 96   Temp 98 F (36.7 C) (Oral)   Resp 19   SpO2 100%  Physical Exam General: wdwn Cardiac: rrr Lungs: unlabored, normal Psych: sitting in chair and appears appropriate  ED Course / MDM  EKG:  Clinical Course as of 09/03/20 0932  Thu Sep 02, 2020  1739 Pt is medically cleared [MV]    Clinical Course User Index [MV] Tanda Rockers, PA-C   I have reviewed the labs performed to date as well as medications administered while in observation.  Recent changes in the last 24 hours include none.  Plan  Current plan is for psych hospitalization. Patient is not under full IVC at this time. Discussed with patient.  She states that she is feeling much better today.  She denies any suicidal or homicidal ideation.  States she is not currently hearing any voices.  She feels like she can go home.  Plan to get TTS to reassess for discharge. Margarita Grizzle, MD 09/03/20 3329    Margarita Grizzle, MD 09/03/20 [phone]

## 2020-09-03 NOTE — ED Notes (Signed)
Pt aox4, NAD. Resting in stretcher, offers no complaints.

## 2020-09-03 NOTE — ED Notes (Addendum)
Sheriff transported contacted at this time., message left.

## 2020-09-03 NOTE — BH Assessment (Signed)
Per Berneice Heinrich, NP, this pt requires psychiatric hospitalization at this time.  Pt presents under IVC initiated by EDP Arby Barrette, MD.  The following facilities have been contacted to seek placement for this pt, with results as noted:  Beds available, information sent, decision pending: [facility]   At capacity: [facility]   Doylene Canning, [place] [id number] Health Coordinator [phone]

## 2020-09-03 NOTE — ED Notes (Signed)
Crackers, cheese and juice provided at pts request.
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
## 2020-09-04 NOTE — ED Notes (Signed)
Patient ambulated to bathroom.

## 2020-09-04 NOTE — ED Notes (Signed)
Patient ambulated to the bathroom.

## 2020-09-04 NOTE — ED Provider Notes (Signed)
Emergency Medicine Observation Re-evaluation Note  Stacy Poole is a 30 y.o. female, seen on rounds today.  Pt initially presented to the ED for complaints of drank hand sanitizer and Psychiatric Evaluation Currently, the patient is   Physical Exam  BP 105/73   Pulse 73   Temp 98.2 F (36.8 C) (Oral)   Resp 16   SpO2 98%  Physical Exam General: wdwn Cardiac: rrr Lungs: normal resp Psych: pleasant cheerful  ED Course / MDM  EKG:  Clinical Course as of 09/04/20 0932  Thu Sep 02, 2020  1739 Pt is medically cleared [MV]    Clinical Course User Index [MV] Tanda Rockers, PA-C   I have reviewed the labs performed to date as well as medications administered while in observation.  Recent changes in the last 24 hours include none.  Pt is hoping for placement today.  Plan  Current plan is for inpatient treatment    Osie Cheeks 09/04/20 4259    Margarita Grizzle, MD 09/04/20 1003

## 2020-09-04 NOTE — ED Notes (Signed)
Patient received breakfast tray 

## 2021-06-01 ENCOUNTER — Other Ambulatory Visit: Payer: Self-pay

## 2021-06-01 ENCOUNTER — Encounter (HOSPITAL_COMMUNITY): Payer: Self-pay | Admitting: Emergency Medicine

## 2021-06-01 ENCOUNTER — Ambulatory Visit (HOSPITAL_COMMUNITY)
Admission: EM | Admit: 2021-06-01 | Discharge: 2021-06-02 | Disposition: A | Discharge status: Other Acute Inpatient Hospital | Payer: BC Managed Care – PPO | Attending: Family | Admitting: Family

## 2021-06-01 DIAGNOSIS — F1590 Other stimulant use, unspecified, uncomplicated: Secondary | ICD-10-CM | POA: Insufficient documentation

## 2021-06-01 DIAGNOSIS — F259 Schizoaffective disorder, unspecified: Secondary | ICD-10-CM | POA: Insufficient documentation

## 2021-06-01 DIAGNOSIS — F4310 Post-traumatic stress disorder, unspecified: Secondary | ICD-10-CM | POA: Diagnosis not present

## 2021-06-01 DIAGNOSIS — Z91128 Patient's intentional underdosing of medication regimen for other reason: Secondary | ICD-10-CM | POA: Diagnosis not present

## 2021-06-01 DIAGNOSIS — F250 Schizoaffective disorder, bipolar type: Secondary | ICD-10-CM

## 2021-06-01 DIAGNOSIS — F10159 Alcohol abuse with alcohol-induced psychotic disorder, unspecified: Secondary | ICD-10-CM | POA: Insufficient documentation

## 2021-06-01 DIAGNOSIS — Z20822 Contact with and (suspected) exposure to covid-19: Secondary | ICD-10-CM | POA: Diagnosis not present

## 2021-06-01 DIAGNOSIS — F319 Bipolar disorder, unspecified: Secondary | ICD-10-CM | POA: Insufficient documentation

## 2021-06-01 DIAGNOSIS — F17210 Nicotine dependence, cigarettes, uncomplicated: Secondary | ICD-10-CM | POA: Insufficient documentation

## 2021-06-01 DIAGNOSIS — F10150 Alcohol abuse with alcohol-induced psychotic disorder with delusions: Secondary | ICD-10-CM | POA: Diagnosis not present

## 2021-06-01 DIAGNOSIS — Z62810 Personal history of physical and sexual abuse in childhood: Secondary | ICD-10-CM | POA: Insufficient documentation

## 2021-06-01 DIAGNOSIS — Z79899 Other long term (current) drug therapy: Secondary | ICD-10-CM | POA: Diagnosis not present

## 2021-06-01 DIAGNOSIS — Z9151 Personal history of suicidal behavior: Secondary | ICD-10-CM | POA: Insufficient documentation

## 2021-06-01 LAB — POCT URINE DRUG SCREEN - MANUAL ENTRY (I-SCREEN)
POC Amphetamine UR: NOT DETECTED
POC Buprenorphine (BUP): NOT DETECTED
POC Cocaine UR: NOT DETECTED
POC Marijuana UR: POSITIVE — AB
POC Methadone UR: NOT DETECTED
POC Methamphetamine UR: NOT DETECTED
POC Morphine: NOT DETECTED
POC Oxazepam (BZO): NOT DETECTED
POC Oxycodone UR: NOT DETECTED
POC Secobarbital (BAR): NOT DETECTED

## 2021-06-01 LAB — COMPREHENSIVE METABOLIC PANEL
ALT: 57 U/L — ABNORMAL HIGH (ref 0–44)
AST: 118 U/L — ABNORMAL HIGH (ref 15–41)
Albumin: 3.9 g/dL (ref 3.5–5.0)
Alkaline Phosphatase: 92 U/L (ref 38–126)
Anion gap: 11 (ref 5–15)
BUN: 16 mg/dL (ref 6–20)
CO2: 23 mmol/L (ref 22–32)
Calcium: 9 mg/dL (ref 8.9–10.3)
Chloride: 99 mmol/L (ref 98–111)
Creatinine, Ser: 0.91 mg/dL (ref 0.44–1.00)
GFR, Estimated: >60 mL/min (ref >60)
Glucose, Bld: 50 mg/dL — ABNORMAL LOW (ref 70–99)
Potassium: 3.7 mmol/L (ref 3.5–5.1)
Sodium: 133 mmol/L — ABNORMAL LOW (ref 135–145)
Total Bilirubin: 1.5 mg/dL — ABNORMAL HIGH (ref 0.3–1.2)
Total Protein: 6.5 g/dL (ref 6.5–8.1)

## 2021-06-01 LAB — CBC WITH DIFFERENTIAL/PLATELET
Abs Immature Granulocytes: 0.03 10*3/uL (ref 0.00–0.07)
Basophils Absolute: 0 10*3/uL (ref 0.0–0.1)
Basophils Relative: 0 %
Eosinophils Absolute: 0.1 10*3/uL (ref 0.0–0.5)
Eosinophils Relative: 1 %
HCT: 42.3 % (ref 36.0–46.0)
Hemoglobin: 14.4 g/dL (ref 12.0–15.0)
Immature Granulocytes: 0 %
Lymphocytes Relative: 26 %
Lymphs Abs: 2.4 10*3/uL (ref 0.7–4.0)
MCH: 29.1 pg (ref 26.0–34.0)
MCHC: 34 g/dL (ref 30.0–36.0)
MCV: 85.6 fL (ref 80.0–100.0)
Monocytes Absolute: 0.7 10*3/uL (ref 0.1–1.0)
Monocytes Relative: 8 %
Neutro Abs: 5.8 10*3/uL (ref 1.7–7.7)
Neutrophils Relative %: 65 %
Platelets: 234 10*3/uL (ref 150–400)
RBC: 4.94 MIL/uL (ref 3.87–5.11)
RDW: 13.8 % (ref 11.5–15.5)
WBC: 9.1 10*3/uL (ref 4.0–10.5)
nRBC: 0 % (ref 0.0–0.2)

## 2021-06-01 LAB — HEMOGLOBIN A1C
Hgb A1c MFr Bld: 5 % (ref 4.8–5.6)
Mean Plasma Glucose: 96.8 mg/dL

## 2021-06-01 LAB — POCT PREGNANCY, URINE: Preg Test, Ur: NEGATIVE

## 2021-06-01 LAB — TSH: TSH: 3.255 u[IU]/mL (ref 0.350–4.500)

## 2021-06-01 LAB — LIPID PANEL
Cholesterol: 162 mg/dL (ref 0–200)
HDL: 107 mg/dL (ref >40)
LDL Cholesterol: 45 mg/dL (ref 0–99)
Total CHOL/HDL Ratio: 1.5 RATIO
Triglycerides: 52 mg/dL (ref <150)
VLDL: 10 mg/dL (ref 0–40)

## 2021-06-01 LAB — ETHANOL: Alcohol, Ethyl (B): <10 mg/dL (ref <10)

## 2021-06-01 LAB — POC SARS CORONAVIRUS 2 AG -  ED: SARS Coronavirus 2 Ag: NEGATIVE

## 2021-06-01 LAB — RESP PANEL BY RT-PCR (FLU A&B, COVID) ARPGX2
Influenza A by PCR: NEGATIVE
Influenza B by PCR: NEGATIVE
SARS Coronavirus 2 by RT PCR: NEGATIVE

## 2021-06-01 LAB — POC SARS CORONAVIRUS 2 AG: SARSCOV2ONAVIRUS 2 AG: NEGATIVE

## 2021-06-01 LAB — MAGNESIUM: Magnesium: 2.5 mg/dL — ABNORMAL HIGH (ref 1.7–2.4)

## 2021-06-01 MED ORDER — LORAZEPAM 1 MG PO TABS
1.0000 mg | ORAL_TABLET | Freq: Four times a day (QID) | ORAL | Status: DC | PRN
  Administered 2021-06-02: 1 mg via ORAL
  Filled 2021-06-01 (×2): qty 1

## 2021-06-01 MED ORDER — ALUM & MAG HYDROXIDE-SIMETH 200-200-20 MG/5ML PO SUSP: 30.0000 mL | ORAL | Status: DC | PRN

## 2021-06-01 MED ORDER — LORAZEPAM 1 MG PO TABS: 1.0000 mg | ORAL_TABLET | Freq: Every day | ORAL | Status: DC

## 2021-06-01 MED ORDER — ONDANSETRON 4 MG PO TBDP: 4.0000 mg | ORAL_TABLET | Freq: Four times a day (QID) | ORAL | Status: DC | PRN

## 2021-06-01 MED ORDER — THIAMINE HCL 100 MG PO TABS
100.0000 mg | ORAL_TABLET | Freq: Every day | ORAL | Status: DC
  Administered 2021-06-02: 100 mg via ORAL
  Filled 2021-06-01: qty 1

## 2021-06-01 MED ORDER — THIAMINE HCL 100 MG/ML IJ SOLN
100.0000 mg | Freq: Once | INTRAMUSCULAR | Status: AC
  Administered 2021-06-01: 100 mg via INTRAMUSCULAR
  Filled 2021-06-01: qty 2

## 2021-06-01 MED ORDER — ADULT MULTIVITAMIN W/MINERALS CH
1.0000 | ORAL_TABLET | Freq: Every day | ORAL | Status: DC
  Administered 2021-06-01 – 2021-06-02 (×2): 1 via ORAL
  Filled 2021-06-01 (×2): qty 1

## 2021-06-01 MED ORDER — LORAZEPAM 1 MG PO TABS: 1.0000 mg | ORAL_TABLET | Freq: Two times a day (BID) | ORAL | Status: DC

## 2021-06-01 MED ORDER — OLANZAPINE 5 MG PO TBDP
5.0000 mg | ORAL_TABLET | Freq: Every day | ORAL | Status: DC
  Administered 2021-06-01 – 2021-06-02 (×2): 5 mg via ORAL
  Filled 2021-06-01 (×2): qty 1

## 2021-06-01 MED ORDER — LOPERAMIDE HCL 2 MG PO CAPS: 2.0000 mg | ORAL_CAPSULE | ORAL | Status: DC | PRN

## 2021-06-01 MED ORDER — HYDROXYZINE HCL 25 MG PO TABS: 25.0000 mg | ORAL_TABLET | Freq: Four times a day (QID) | ORAL | Status: DC | PRN

## 2021-06-01 MED ORDER — OLANZAPINE 10 MG PO TBDP
10.0000 mg | ORAL_TABLET | Freq: Every day | ORAL | Status: DC
  Administered 2021-06-01: 10 mg via ORAL
  Filled 2021-06-01: qty 1

## 2021-06-01 MED ORDER — ACETAMINOPHEN 325 MG PO TABS
650.0000 mg | ORAL_TABLET | Freq: Four times a day (QID) | ORAL | Status: DC | PRN
  Administered 2021-06-02: 650 mg via ORAL
  Filled 2021-06-01: qty 2

## 2021-06-01 MED ORDER — LORAZEPAM 1 MG PO TABS
1.0000 mg | ORAL_TABLET | Freq: Three times a day (TID) | ORAL | Status: DC
  Administered 2021-06-02: 1 mg via ORAL
  Filled 2021-06-01: qty 1

## 2021-06-01 MED ORDER — MAGNESIUM HYDROXIDE 400 MG/5ML PO SUSP: 30.0000 mL | Freq: Every day | ORAL | Status: DC | PRN

## 2021-06-01 MED ORDER — LORAZEPAM 1 MG PO TABS
1.0000 mg | ORAL_TABLET | Freq: Four times a day (QID) | ORAL | Status: AC
  Administered 2021-06-01 (×3): 1 mg via ORAL
  Filled 2021-06-01 (×3): qty 1
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
## 2021-06-01 NOTE — ED Notes (Signed)
Pt heard in the bathroom several times putting water on in the shower.  When pt leaves bathroom her clothes are dry.  Pt  took shower earlier without asking staff and was then provided a  clean change of clothes.  Since receiving new clothing pt has returned to the bathroom twice more to run the shower water.  When asked why she was running the water the pt stated she needed a towel to put on her back because it was hurting her.  Asked pt if she would like a PRN pain medication, pt looked at nurse and walked to the bathroom without answering.  Will continue monitoring for safety.

## 2021-06-01 NOTE — Progress Notes (Signed)
Pt is asleep. Respirations are even and unlabored. No distress noted. Monitoring for safety.
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
## 2021-06-01 NOTE — Progress Notes (Signed)
Old Onnie Graham contacted CSW to inform her that the acceptance information has changed and the Pt will be going to [facility] and the new reporting number is [id number]. CSW has informed RN and NP regarding the changes.    Damita Dunnings, MSW, LCSW-A  2:26 PM 06/01/2021

## 2021-06-01 NOTE — Progress Notes (Signed)
Patient has been denied by [facility] due to no beds available. Patient meets inpatient criteria per Inetta Fermo Allen,NP. Patient referred to the following facilities:  [facility]  [address], [place] [address] [phone]  [facility]  [address], [place] [address] [phone]  [facility]  [address] [place] [address] [phone]  [facility]  [address] [place] [address] [place] [address] [phone]  [facility]  [address] [place] [address] [phone]  [facility]  [address] [place], [place] [address] [phone]  [facility]  [address] [place], [place] [address] [phone]  [facility]  [phone] [place] [address] [phone]  [facility]  [address] [place] [address] [phone]  [facility]  [address], [place] [address] [phone]  [facility]  [address], [place] [address] [phone]  [facility]  [address] [place] [address] [phone]    CSW will continue to monitor disposition.     Damita Dunnings, MSW, LCSW-A  1:37 PM 06/01/2021

## 2021-06-01 NOTE — ED Notes (Addendum)
Per Zonia Kief, pt is accepted at [facility] and is expected at 0800 on 06/02/2021.  Pt is currently under IVC orders.  Per Yvetta Coder, copies of IVC paperwork and PCR need to be faxed before pt is transported Probation officer # [phone]).    The number provided to call nurse to nurse report is [phone] [facility]).  Accepting doctor is Patent examiner. Will continue to monitor for safety.
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
## 2021-06-01 NOTE — Progress Notes (Signed)
Pt accepted to [facility]     Patient meets inpatient criteria per Doran Heater, NP   Dr.Buttar is the attending provider.    Call report to [phone]  Dickie La, RN @ [facility] notified.     Pt scheduled  to arrive at [facility] tomorrow after 0800. Pt's IVC paperwork and negative COVID results will need to be faxed to [phone] prior to transporting the Pt to the facility.   Damita Dunnings, MSW, LCSW-A  1:59 PM 06/01/2021

## 2021-06-01 NOTE — Progress Notes (Signed)
Pt is presently asleep. Respirations are even and unlabored. No sign of acute distress noted. Unable to assess CIWA at this time. Will continue to monitor.

## 2021-06-01 NOTE — ED Notes (Signed)
Pt finished showering and sitting in bed. A&Ox4 with no signs of acute distress. Calm and cooperative. Will continue to monitor.

## 2021-06-01 NOTE — Progress Notes (Signed)
Pt's CIWA was 15. Inetta Fermo, FNP notified in person.

## 2021-06-01 NOTE — ED Notes (Signed)
Pt refused to let nurse draw blood at this time.  Pt stated "I'm dehydrated and I will pass out."  Provided pt with water and encouraged her to drink fluids. Covid swabs, UDS, and pregnancy test have been collected at this time at stat lab has been called.  Will continue to monitor for safety.

## 2021-06-01 NOTE — ED Notes (Signed)
Pt back and forth between using phone and lying in bed. Used a marker to draw on chest and write "I'm on a boat" on face mask. A&Ox4, Cooperative, figity, and anxious. Pt denies SI, HI, and AVH. Pt able to contract for safety on unit. Pt given a sandwich, and salad per request. No signs of acute distress. Will monitor for safety.
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
## 2021-06-02 NOTE — ED Notes (Signed)
Pt given breakfast.
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
## 2021-06-02 NOTE — ED Notes (Signed)
Pt sleeping in bed. Respirations even and unlabored. No signs of acute distress. Will continue to monitor for safety.

## 2021-06-02 NOTE — Discharge Instructions (Addendum)
Transfer to [facility] 

## 2021-06-02 NOTE — Progress Notes (Signed)
Received Stacy Poole this AM awake in her chair bed eating her breakfast. She requested her vital signs to be checked,ativan and tylenol.  Her vitals signs were rechecked. Report was called to nurse Cherly Hensen at [facility] at [phone] hrs. Her last CIWA score was 6.

## 2021-06-08 ENCOUNTER — Telehealth (HOSPITAL_COMMUNITY): Payer: Self-pay | Admitting: Emergency Medicine

## 2021-06-08 NOTE — BH Assessment (Signed)
Care Management - Follow Up [facility] [address] Discharges   Per chart review, patient was accepted to [facility] inpatient psychiatric hospital.

## 2021-06-21 ENCOUNTER — Ambulatory Visit (HOSPITAL_COMMUNITY)
Admission: RE | Admit: 2021-06-21 | Discharge: 2021-06-21 | Disposition: A | Discharge status: Home / Self Care | Payer: BC Managed Care – PPO | Attending: Psychiatry | Admitting: Psychiatry

## 2021-06-21 NOTE — H&P (Addendum)
Behavioral Health Medical Screening Exam  Visit Diagnosis:   -Schizoaffective disorder, bipolar type (HCC)  Stacy Poole is a 30 y.o. female with documented past psychiatric history of Schizoaffective disorder, bipolar type, sever alcohol use disorder/abuse with dependence/alcohol-induced psychotic disorder with delusions, cannabis use disorder with dependence, and stimulant-induced psychotic disorder with hallucinations, as well as no apparent documented significant past medical history (patient reports history of hypertension), who presents to the [facility]) as a voluntary walk-in accompanied by her mother Stacy Poole: [phone]) for concerns regarding patient's behavior. With patient's consent, patient's mother present during the evaluation and information was obtained from the patient and patient's mother during the course of the evaluation.  When patient is asked why she came to [facility] this evening, patient states "I'm being gang stalked by the [employer] and [employer]. I work for [employer]". When patient is asked how long these events have been occurring for, patient states that she "lost track at this point". Patient also states that the [employer] Stacy Poole is trying to blow up her apartment and that someone has put "pork smells" into her apartment. She also reports that she thinks she might be pregnant. Patient denies SI on exam. She reports history of one past suicide attempt in 2011, in which she states that she "drank a bunch of robitussin" for that attempt. She denies history of self-injurious behavior via intentionally cutting or burning herself. Patient denies HI, but she does state that she would harm others if they tried to harm her loved ones. Patient's mother confirms this is true. Patient does not provide further details regarding this and states that she cannot disclose further details because "it is an ongoing situation". Patient's mother denies any recent history  of the patient making any suicidal statements. Patient denies AVH.   Patient describes her sleep as poor, but she states she is not sure how many hours she sleeps per night. She reports that she feels like she does not need to sleep. Patient and patient's mother report that the patient is prescribed Ambien for sleep, but they are unable to provide details regarding the frequency/dosage of this medication at this time. PDMP review shows that patient filled a 30 day supply of 30 Ambien tablets on 06/15/21. 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 She denies feelings of guilt, hopelessness, or worthlessness. She describes her energy as fluctuating and states that her concentration/focus is "spot on". Patient reports her appetite has been increased recently and when asked about if she has had any recent weight changes, patient does not provide an answer about this but states that she thinks she might be pregnant. Patient's mother states that the patient took a home pregnancy test recently within the past few days, which was reportedly negative.   Per chart review, patient recently presented to the [facility]) on 06/01/21. At that time, patient was placed under IVC and recommended for inpatient psychiatric treatment. Patient was then admitted to [facility] overnight continuous assessment on 06/01/21 while waiting for placement for inpatient treatment. Patient was then accepted to and transferred to [facility] for inpatient psychiatric treatment on 06/02/21.  Patient and patient's mother state that the patient was discharged from [facility] on last Thursday, 06/16/2021.  Patient and patient's mother initially state that the only psychotropic medications the patient is currently taking at this time are Zyprexa, Lamictal, and Ambien, although they are initially unable to  provide details regarding frequency/dosage of these medications.   Patient's mother then reports that she believes the patient is taking Zyprexa 30 mg p.o. twice daily, Prozac 10 mg every morning, Ambien 10 mg at bedtime, and prazosin 2 mg at bedtime.  Chart review does show a recent history of patient having prescriptions filled last week for Prozac 10 mg p.o. daily, prazosin 2 mg p.o. at bedtime, Ambien 10 mg p.o. every night, and Lamictal 25 mg p.o. every night (patient's mother unsure of patient's Lamictal dosage at this time).  I am unable to find a record of any Zyprexa prescriptions per my chart review.  Patient states that she takes all of her medications as prescribed and she took all of her medications today, but patient's mother states that she is not sure if the patient is actually taking her medications as prescribed.  Patient states that she was restarted on Lamictal while at [facility] during her recent hospitalization and she reports that towards the end of this hospitalization, she developed a rash on her bilateral hands and she thinks this is related to the Lamictal.  Patient's mother states that she noticed this rash on patient's hands when patient was picked up from [facility] on 06/16/2021 and she states that she believes that this rash may have worsened somewhat since 06/16/2021.  Patient denies any additional physical symptoms on exam at this time.  Patient and patient's mother state that the patient was [facility] and was also receiving therapy there as well prior to her 06/02/2021 [facility] admission.  Patient's mother states that patient's next outpatient psychotropic medication management appointment with Dr. Jannifer Poole is on 06/27/2021 and that patient's next therapy appointment is on 06/28/2021.  Patient's mother states that the patient's presentation has been unchanged since she was discharged from [facility] on 06/16/2021.  Patient's mother also  reports that the patient was psychiatrically hospitalized at [facility] on another occasion in November/December of 2021. Patient and patient's mother report that the care patient received [facility] on these past 2 hospitalizations did not improve patient's condition and thus, patient and patient's mother are adamant that patient cannot return to [facility] again for inpatient psychiatric treatment.  Patient and patient's mother report that the patient has been psychiatrically hospitalized on multiple other occasions as well at [facility], [address], [address], and [place].  Patient and patient's mother report that the patient received good care when she was hospitalized at [facility], [facility], and [facility] in the past.  Per chart review, it appears that patient was admitted to [facility] in the past from 11/13/2015 to 11/18/2015, 03/27/2019 to 04/04/2019, and 10/14/2019 to 10/24/2019, as well as psychiatrically admitted to [facility] from 05/06/2018 and 05/14/2018.  Patient and patient's mother report that the patient lives alone in [place].  Patient denies access to firearms or weapons.  Patient reports that she is "a major alcoholic".  Patient does have documented history of severe alcohol use disorder with dependence and alcohol induced psychotic disorder.  Unfortunately, when patient is asked about how frequently she consumes alcohol and what her usual quantity of alcohol consumption is, patient states that she is not sure.  Patient's mother states that the patient has had severe issues with alcohol abuse in the past but she reports that the patient has not drank much alcohol since she was discharged from [facility] on 06/16/2021.  Patient does report that she had a  Guinness to drink earlier today on 06/21/2021.  Patient's mother does state in the recent past, the patient would occasionally drink as much as 4 bottles of wine in a single day.  Patient endorses history of alcohol withdrawal symptoms and seizures, but  patient does not specify further on what her alcohol withdrawal symptoms are when she is asked to do so.  Patient's mother denies the patient having a history of seizures.  Patient's mother states that the patient is prescribed Lamictal for psychiatric reasons and not for seizure disorder.  Patient endorses using marijuana and CBD.  When patient is asked about how frequently she uses these products and how much of these products she typically uses, patient states "not often", reports that she does not know how much she typically uses and does not provide further details.  Patient states she cannot recall when her last marijuana/CBD consumption was.  Patient denies any additional illicit substance use.  Patient states that she is currently employed for [employer], the patient's mother states that she believes that the patient may not have this job anymore.  On exam, patient is sitting with bizarre appearance, appearing restless, but in no acute distress.  Patient intermittently clutches her head and screams during the evaluation.  Patient appears to be actively psychotic and demonstrating manic features at this time.  Patient's mood is irritable with congruent, inappropriate, labile affect.  Speech volume is increased and speech is rapid and pressured.  Patient is tangential with scattered thought content.  Patient exhibits significant paranoid ideation, stating that she has been getting stalked by the [facility] and [employer].  Patient is also significantly delusional as well.  Patient appears to be responding to internal stimuli at this time and is noticed to be speaking to herself at times throughout the evaluation.  Patient also states that sometimes she is a werewolf and other times she has a vampire.  Patient is also noted to be speaking in other languages other than English throughout the evaluation that I am not able to identify. Patient alert and oriented to person and place, but not to time (states that the current  date is 07/12/1991).  She is mostly cooperative with the evaluation but provides inappropriate answers to questions at times throughout the evaluation.  Total Time spent with patient: 30 minutes  Psychiatric Specialty Exam:  Presentation  General Appearance: Disheveled; Bizarre  Eye Contact:Fleeting; Fair  Speech:Pressured  Speech Volume:Increased  Handedness:Right   Mood and Affect  Mood:Irritable  Affect:Congruent; Inappropriate; Labile   Thought Process  Thought Processes:Disorganized  Descriptions of Associations:Tangential  Orientation:Partial (Patient alert and oriented to person and place, but not to time (states that the current date is 1991/08/01))  Thought Content:Paranoid Ideation; Delusions; Tangential; Scattered  History of Schizophrenia/Schizoaffective disorder:Yes  Duration of Psychotic Symptoms:Less than six months  Hallucinations:Hallucinations: None  Ideas of Reference:Paranoia; Delusions  Suicidal Thoughts:Suicidal Thoughts: No  Homicidal Thoughts:Homicidal Thoughts: -- (Patient denies HI, but patient states that she would harm others if they tried to harm her loved ones (patient does not provide further details regarding this).)   Sensorium  Memory:Immediate Fair; Recent Fair; Remote Fair  Judgment:Poor  Insight:Poor; Lacking   Executive Functions  Concentration:Poor  Attention Span:Poor  Recall:Fair  Fund of Knowledge:Fair  Language:Fair   Psychomotor Activity  Psychomotor Activity:Psychomotor Activity: Restlessness; Increased   Assets  Assets:Communication Skills; Housing; Leisure Time; Physical Health; Social Support   Sleep  Sleep:Sleep: Poor    Physical Exam: Physical Exam Vitals  reviewed.  Constitutional:      General: She is not in acute distress.    Appearance: She is not ill-appearing, toxic-appearing or diaphoretic.  HENT:     Head: Normocephalic and atraumatic.     Right Ear: External ear  normal.     Left Ear: External ear normal.     Nose: Nose normal.  Eyes:     General:        Right eye: No discharge.        Left eye: No discharge.     Conjunctiva/sclera: Conjunctivae normal.  Cardiovascular:     Rate and Rhythm: Normal rate.  Pulmonary:     Effort: Pulmonary effort is normal. No respiratory distress.  Musculoskeletal:        General: Normal range of motion.     Cervical back: Normal range of motion.  Skin:    Comments: Red maculopapular rash noted on the dorsal surface of patient's bilateral hands.  Neurological:     General: No focal deficit present.     Mental Status: She is alert.     Comments: No tremor noted. Patient alert and oriented to person and place, but not to time (states that the current date is [birth year]).  Psychiatric:        Attention and Perception: She does not perceive auditory or visual hallucinations.        Speech: Speech is rapid and pressured.        Behavior: Behavior is agitated and hyperactive. Behavior is not slowed, aggressive, withdrawn or combative.        Thought Content: Thought content is paranoid and delusional. Thought content does not include homicidal or suicidal ideation.     Comments: Patient's mood is irritable with congruent, inappropriate, labile affect.   Review of Systems  Constitutional:  Positive for malaise/fatigue. Negative for chills, diaphoresis and fever.       When asked about if she has had any recent weight changes, patient does not provide an answer about this but states that she thinks she might be pregnant.   HENT:  Negative for congestion.   Respiratory:  Negative for cough and shortness of breath.   Cardiovascular:  Negative for chest pain and palpitations.  Gastrointestinal:  Negative for abdominal pain, constipation, diarrhea, nausea and vomiting.  Musculoskeletal:  Negative for joint pain and myalgias.  Skin:        Patient states that she was restarted on Lamictal while at [facility]  during her recent hospitalization and she reports that towards the end of this hospitalization, she developed a rash on her bilateral hands and she thinks this is related to the Lamictal.  Patient's mother states that she noticed this rash on patient's hands when patient was picked up from [facility] on 06/16/2021 and she states that she believes that this rash may have worsened somewhat since 06/16/2021.   Neurological:  Negative for dizziness and headaches.       Patient endorses history of alcohol withdrawal symptoms and seizures, but patient does not specify further on what her alcohol withdrawal symptoms are when she is asked to do so.  Patient's mother denies the patient having a history of seizures.  Psychiatric/Behavioral:  Positive for substance abuse. Negative for depression, hallucinations, memory loss and suicidal ideas. The patient has insomnia.   All other systems reviewed and are negative.  Vitals: Blood pressure (!) 135/93, pulse 94, temperature 98.6 F (37 C), resp. rate 14, SpO2 100 %. There  is no height or weight on file to calculate BMI.  Musculoskeletal: Strength & Muscle Tone: within normal limits Gait & Station: normal Patient leans: N/A   Recommendations:  Based on my evaluation the patient does not appear to have an emergency medical condition.  Based on patient's current presentation of psychosis, delusions, paranoia, and manic features, patient's current psychiatric presentation appears to be significantly negatively impacted to the point that patient is a potential threat to herself and is having severe difficulty with adequately functioning in her activities of daily living.  Thus, patient meets criteria for inpatient psychiatric treatment.  Recommend inpatient psychiatric treatment for the patient.  There are no available beds at [facility] at this time.  Patient and patient's mother are agreeable to inpatient psychiatric treatment.  Patient and patient's mother are  adamant that the patient cannot return to [facility] for inpatient psychiatric treatment again and patient and patient's mother are requesting that the patient go to a different facility such as [facility], [facility], or [facility] for inpatient psychiatric treatment.  Although Lamictal can potentially cause significant skin reaction such as SJS/TEN, based on description and presentation of patient's bilateral hand rash rash (see HPI and physical exam for details), I have low suspicion that patient is experiencing a severe skin reaction such as SJS/TEN at this time.  However, I believe that patient needs further medical work-up/evaluation of this bilateral hand rash at this time.  Thus, will transfer the patient to [facility] emergency department for medical clearance/work-up/evaluation of patient's bilateral hand rash and for further observation/safety monitoring and social work will seek placement for inpatient psychiatric treatment for the patient while the patient is in the emergency department.  Patient and patient's mother are agreeable to the overall plan of transfer to [facility] ED for medical clearance and observation/safety monitoring and for eventual inpatient psychiatric treatment once medically cleared.  Provider report given to Dr. Bebe Shaggy, who has agreed to accept the patient.  Provider report also given to [facility] ED charge nurse Victorino Dike).  Notified Dr. Bebe Shaggy and [facility] ED charge RN that patient is currently voluntary and willing to receive medical clearance and inpatient psychiatric treatment, but if the patient attempts to leave/elope from the ED, the patient would then meet criteria for IVC at that time.  Dr. Bebe Shaggy and [facility] ED charge nurse verbalize understanding of this.  Patient will be transferred to [facility] ED by safe transport and patient's mother states that she will follow safe transport over to the ED and will go into the ED with the patient.  Due to concerns regarding  patient's bilateral hand rash, will stop patient's Lamictal at this time.  We will order Lamictal level to be drawn on the patient when she is in the emergency department.   Will order EKG while patient is in the ED to check patient's QT/QTC due to patient's history of being on antipsychotic medications.  Patient's recent EKG from 06/06/2021 showed QT/QTC of 344/436 ms.   Patient also had antipsychotic screening labs done recently of hemoglobin A1c, lipid panel, and TSH done on 06/01/2021 these were reviewed by myself and were all within normal limits.   Will resume the following home psychotropic medications at this time:  -Prozac 10 mg PO daily for depressive symptoms/anxiety -Prazosin 2 mg PO Daily at bedtime for nightmares   Will resume additional home psychotropic medications accordingly once patient's home medications are fully reconciled by pharmacy in the ED.  Patient's mother reports she thinks the  patient may be taking Zyprexa 30 mg BID. However, I am unable to verify any Zyprexa/antipsychotic prescriptions at this time. If pharmacy is unable to confirm that patient is taking an antipsychotic medication at home, antipsychotic medications will need to be reinitiated while patient is in the ED if updated QT/QTC is appropriate.   -Update: After ED pharmacy home medication reconciliation, it appears that there is no documentation of antipsychotic prescriptions in patient's PTA med list.    -Patient's updated 06/22/21 EKG shows normal sinus rhythm with no acute/concerning findings with QT/QTC of 344/436 ms, which is appropriate for re-initiation of antipsychotic medications at this time.    -Per chart review, patient was taking Zyprexa 5 mg daily and Zyprexa 10 mg QHS while at the [facility] on 06/01/21/06/02/21. Due to uncertainty regarding patient's home antipsychotic/Zyprexa prescription dosage at this time, will initiate Zyprexa at lower dose than the reported potential dose of 30 mg BID reported by  patient's mother. Will initiate the above Zyprexa dosages that patient recently received/had ordered at [facility] on 06/01/21/06/02/21 of Zyprexa 5 mg daily and Zyprexa 10 mg QHS while patient is in the ED at this time for Schizophrenia/Schizoaffective disorder/Psychosis.  Orders placed for the following PRN medications for patient to receive in the ED if necessary:   -Vistaril 25 mg PO TID PRN for anxiety  -Trazodone 50 mg PO at bedtime PRN for sleep  Due to patient's history of alcohol use disorder/dependence and patient's ambiguity regarding her responses to questions asked about alcohol use and alcohol withdrawal symptoms, recommend that patient be placed on CIWA protocol while in the ED.   Jaclyn Shaggy, PA-C 06/21/2021, 10:29 PM

## 2021-06-22 ENCOUNTER — Encounter (HOSPITAL_COMMUNITY): Payer: Self-pay | Admitting: Psychiatry

## 2021-06-22 ENCOUNTER — Other Ambulatory Visit: Payer: Self-pay

## 2021-06-22 ENCOUNTER — Encounter (HOSPITAL_COMMUNITY): Payer: Self-pay

## 2021-06-22 ENCOUNTER — Emergency Department (HOSPITAL_COMMUNITY)
Admission: EM | Admit: 2021-06-22 | Discharge: 2021-06-22 | Disposition: A | Discharge status: Other Acute Inpatient Hospital | Payer: BC Managed Care – PPO | Source: Home / Self Care

## 2021-06-22 ENCOUNTER — Inpatient Hospital Stay (HOSPITAL_COMMUNITY)
Admission: AD | Admit: 2021-06-22 | Discharge: 2021-07-05 | DRG: 885 | Disposition: A | Discharge status: Home / Self Care | Payer: BC Managed Care – PPO | Source: Intra-hospital | Attending: Psychiatry | Admitting: Psychiatry

## 2021-06-22 DIAGNOSIS — G932 Benign intracranial hypertension: Secondary | ICD-10-CM | POA: Diagnosis present

## 2021-06-22 DIAGNOSIS — Z87891 Personal history of nicotine dependence: Secondary | ICD-10-CM | POA: Diagnosis not present

## 2021-06-22 DIAGNOSIS — F17210 Nicotine dependence, cigarettes, uncomplicated: Secondary | ICD-10-CM | POA: Insufficient documentation

## 2021-06-22 DIAGNOSIS — F312 Bipolar disorder, current episode manic severe with psychotic features: Secondary | ICD-10-CM | POA: Diagnosis present

## 2021-06-22 DIAGNOSIS — Z79899 Other long term (current) drug therapy: Secondary | ICD-10-CM

## 2021-06-22 DIAGNOSIS — Z20822 Contact with and (suspected) exposure to covid-19: Secondary | ICD-10-CM | POA: Insufficient documentation

## 2021-06-22 DIAGNOSIS — F22 Delusional disorders: Secondary | ICD-10-CM | POA: Diagnosis not present

## 2021-06-22 DIAGNOSIS — G4700 Insomnia, unspecified: Secondary | ICD-10-CM | POA: Diagnosis present

## 2021-06-22 DIAGNOSIS — F29 Unspecified psychosis not due to a substance or known physiological condition: Secondary | ICD-10-CM | POA: Insufficient documentation

## 2021-06-22 DIAGNOSIS — K30 Functional dyspepsia: Secondary | ICD-10-CM | POA: Diagnosis present

## 2021-06-22 DIAGNOSIS — K5900 Constipation, unspecified: Secondary | ICD-10-CM | POA: Diagnosis present

## 2021-06-22 DIAGNOSIS — F4310 Post-traumatic stress disorder, unspecified: Secondary | ICD-10-CM | POA: Diagnosis present

## 2021-06-22 DIAGNOSIS — F23 Brief psychotic disorder: Secondary | ICD-10-CM

## 2021-06-22 DIAGNOSIS — F17200 Nicotine dependence, unspecified, uncomplicated: Secondary | ICD-10-CM | POA: Diagnosis present

## 2021-06-22 LAB — RAPID URINE DRUG SCREEN, HOSP PERFORMED
Amphetamines: NOT DETECTED
Barbiturates: NOT DETECTED
Benzodiazepines: NOT DETECTED
Cocaine: NOT DETECTED
Opiates: NOT DETECTED
Tetrahydrocannabinol: NOT DETECTED

## 2021-06-22 LAB — CBC
HCT: 40.4 % (ref 36.0–46.0)
Hemoglobin: 13.3 g/dL (ref 12.0–15.0)
MCH: 29.6 pg (ref 26.0–34.0)
MCHC: 32.9 g/dL (ref 30.0–36.0)
MCV: 90 fL (ref 80.0–100.0)
Platelets: 390 10*3/uL (ref 150–400)
RBC: 4.49 MIL/uL (ref 3.87–5.11)
RDW: 13.9 % (ref 11.5–15.5)
WBC: 8.8 10*3/uL (ref 4.0–10.5)
nRBC: 0 % (ref 0.0–0.2)

## 2021-06-22 LAB — I-STAT BETA HCG BLOOD, ED (MC, WL, AP ONLY): I-stat hCG, quantitative: <5 m[IU]/mL (ref <5)

## 2021-06-22 LAB — COMPREHENSIVE METABOLIC PANEL
ALT: 22 U/L (ref 0–44)
AST: 22 U/L (ref 15–41)
Albumin: 4.3 g/dL (ref 3.5–5.0)
Alkaline Phosphatase: 56 U/L (ref 38–126)
Anion gap: 7 (ref 5–15)
BUN: 10 mg/dL (ref 6–20)
CO2: 28 mmol/L (ref 22–32)
Calcium: 9.5 mg/dL (ref 8.9–10.3)
Chloride: 104 mmol/L (ref 98–111)
Creatinine, Ser: 0.66 mg/dL (ref 0.44–1.00)
GFR, Estimated: >60 mL/min (ref >60)
Glucose, Bld: 91 mg/dL (ref 70–99)
Potassium: 3.7 mmol/L (ref 3.5–5.1)
Sodium: 139 mmol/L (ref 135–145)
Total Bilirubin: 0.6 mg/dL (ref 0.3–1.2)
Total Protein: 7.5 g/dL (ref 6.5–8.1)

## 2021-06-22 LAB — ETHANOL: Alcohol, Ethyl (B): <10 mg/dL (ref <10)

## 2021-06-22 LAB — RESP PANEL BY RT-PCR (FLU A&B, COVID) ARPGX2
Influenza A by PCR: NEGATIVE
Influenza B by PCR: NEGATIVE
SARS Coronavirus 2 by RT PCR: NEGATIVE

## 2021-06-22 LAB — SALICYLATE LEVEL: Salicylate Lvl: <7 mg/dL — ABNORMAL LOW (ref 7.0–30.0)

## 2021-06-22 LAB — ACETAMINOPHEN LEVEL: Acetaminophen (Tylenol), Serum: <10 ug/mL — ABNORMAL LOW (ref 10–30)

## 2021-06-22 MED ORDER — LORAZEPAM 2 MG/ML IJ SOLN: 0.0000 mg | Freq: Four times a day (QID) | INTRAMUSCULAR | Status: DC

## 2021-06-22 MED ORDER — OLANZAPINE 10 MG PO TABS: 10.0000 mg | ORAL_TABLET | Freq: Every day | ORAL | Status: DC

## 2021-06-22 MED ORDER — ZIPRASIDONE MESYLATE 20 MG IM SOLR
20.0000 mg | Freq: Once | INTRAMUSCULAR | Status: AC
  Administered 2021-06-22: 20 mg via INTRAMUSCULAR
  Filled 2021-06-22: qty 20

## 2021-06-22 MED ORDER — OLANZAPINE 5 MG PO TABS
5.0000 mg | ORAL_TABLET | Freq: Every day | ORAL | Status: DC
  Administered 2021-06-22: 5 mg via ORAL
  Filled 2021-06-22: qty 1

## 2021-06-22 MED ORDER — THIAMINE HCL 100 MG PO TABS
100.0000 mg | ORAL_TABLET | Freq: Every day | ORAL | Status: DC
Start: 2021-06-22 — End: 2021-06-22
  Administered 2021-06-22: 100 mg via ORAL
  Filled 2021-06-22: qty 1

## 2021-06-22 MED ORDER — HYDROXYZINE HCL 25 MG PO TABS
25.0000 mg | ORAL_TABLET | Freq: Three times a day (TID) | ORAL | Status: DC | PRN
  Administered 2021-06-22 – 2021-07-04 (×19): 25 mg via ORAL
  Filled 2021-06-22 (×21): qty 1

## 2021-06-22 MED ORDER — THIAMINE HCL 100 MG/ML IJ SOLN
100.0000 mg | Freq: Every day | INTRAMUSCULAR | Status: DC
Start: 2021-06-22 — End: 2021-06-22

## 2021-06-22 MED ORDER — STERILE WATER FOR INJECTION IJ SOLN
INTRAMUSCULAR | Status: AC
  Administered 2021-06-22: 1.2 mL
  Filled 2021-06-22: qty 10

## 2021-06-22 MED ORDER — ALUM & MAG HYDROXIDE-SIMETH 200-200-20 MG/5ML PO SUSP
30.0000 mL | ORAL | Status: DC | PRN
  Administered 2021-06-22 – 2021-06-23 (×2): 30 mL via ORAL
  Filled 2021-06-22 (×2): qty 30

## 2021-06-22 MED ORDER — LORAZEPAM 1 MG PO TABS
1.0000 mg | ORAL_TABLET | Freq: Once | ORAL | Status: AC
  Administered 2021-06-22: 1 mg via ORAL
  Filled 2021-06-22: qty 1

## 2021-06-22 MED ORDER — ACETAMINOPHEN 325 MG PO TABS
650.0000 mg | ORAL_TABLET | ORAL | Status: DC | PRN
Start: 2021-06-22 — End: 2021-06-22
  Filled 2021-06-22: qty 2

## 2021-06-22 MED ORDER — OLANZAPINE 5 MG PO TABS
5.0000 mg | ORAL_TABLET | Freq: Every day | ORAL | Status: DC
  Administered 2021-06-23 – 2021-07-03 (×11): 5 mg via ORAL
  Filled 2021-06-22: qty 1
  Filled 2021-06-22: qty 2
  Filled 2021-06-22 (×7): qty 1
  Filled 2021-06-22: qty 2
  Filled 2021-06-22 (×6): qty 1

## 2021-06-22 MED ORDER — NICOTINE 21 MG/24HR TD PT24: 21.0000 mg | MEDICATED_PATCH | Freq: Every day | TRANSDERMAL | Status: DC | PRN

## 2021-06-22 MED ORDER — FLUOXETINE HCL 10 MG PO CAPS
10.0000 mg | ORAL_CAPSULE | Freq: Every day | ORAL | Status: DC
  Administered 2021-06-23: 10 mg via ORAL
  Filled 2021-06-22 (×4): qty 1

## 2021-06-22 MED ORDER — ACETAMINOPHEN 325 MG PO TABS
650.0000 mg | ORAL_TABLET | ORAL | Status: DC | PRN
  Administered 2021-06-25 – 2021-06-29 (×4): 650 mg via ORAL
  Filled 2021-06-22 (×3): qty 2

## 2021-06-22 MED ORDER — ALBUTEROL SULFATE HFA 108 (90 BASE) MCG/ACT IN AERS: 1.0000 | INHALATION_SPRAY | Freq: Three times a day (TID) | RESPIRATORY_TRACT | Status: DC | PRN

## 2021-06-22 MED ORDER — NICOTINE 21 MG/24HR TD PT24
21.0000 mg | MEDICATED_PATCH | Freq: Every day | TRANSDERMAL | Status: DC | PRN
  Administered 2021-06-23 – 2021-07-05 (×11): 21 mg via TRANSDERMAL
  Filled 2021-06-22 (×11): qty 1

## 2021-06-22 MED ORDER — LORAZEPAM 1 MG PO TABS: 0.0000 mg | ORAL_TABLET | Freq: Four times a day (QID) | ORAL | Status: DC

## 2021-06-22 MED ORDER — PRAZOSIN HCL 1 MG PO CAPS: 2.0000 mg | ORAL_CAPSULE | Freq: Every day | ORAL | Status: DC

## 2021-06-22 MED ORDER — IBUPROFEN 600 MG PO TABS
600.0000 mg | ORAL_TABLET | Freq: Four times a day (QID) | ORAL | Status: DC | PRN
  Administered 2021-06-22 – 2021-07-05 (×11): 600 mg via ORAL
  Filled 2021-06-22 (×12): qty 1

## 2021-06-22 MED ORDER — LORAZEPAM 2 MG/ML IJ SOLN: 0.0000 mg | Freq: Two times a day (BID) | INTRAMUSCULAR | Status: DC

## 2021-06-22 MED ORDER — OLANZAPINE 10 MG PO TABS
10.0000 mg | ORAL_TABLET | Freq: Every day | ORAL | Status: DC
  Administered 2021-06-22 – 2021-06-26 (×5): 10 mg via ORAL
  Filled 2021-06-22 (×8): qty 1

## 2021-06-22 MED ORDER — MAGNESIUM HYDROXIDE 400 MG/5ML PO SUSP: 30.0000 mL | Freq: Every day | ORAL | Status: DC | PRN

## 2021-06-22 MED ORDER — PRAZOSIN HCL 2 MG PO CAPS
2.0000 mg | ORAL_CAPSULE | Freq: Every day | ORAL | Status: DC
  Administered 2021-06-22 – 2021-06-24 (×3): 2 mg via ORAL
  Filled 2021-06-22: qty 1
  Filled 2021-06-22: qty 2
  Filled 2021-06-22 (×4): qty 1

## 2021-06-22 MED ORDER — TRAZODONE HCL 50 MG PO TABS
50.0000 mg | ORAL_TABLET | Freq: Every evening | ORAL | Status: DC | PRN
  Filled 2021-06-22: qty 1

## 2021-06-22 MED ORDER — FLUOXETINE HCL 10 MG PO CAPS
10.0000 mg | ORAL_CAPSULE | Freq: Every day | ORAL | Status: DC
  Administered 2021-06-22: 10 mg via ORAL
  Filled 2021-06-22: qty 1

## 2021-06-22 MED ORDER — FOLIC ACID 1 MG PO TABS
1.0000 mg | ORAL_TABLET | Freq: Every day | ORAL | Status: DC
  Administered 2021-06-22: 1 mg via ORAL
  Filled 2021-06-22: qty 1

## 2021-06-22 MED ORDER — TRAZODONE HCL 50 MG PO TABS
50.0000 mg | ORAL_TABLET | Freq: Every evening | ORAL | Status: DC | PRN
  Administered 2021-06-22 – 2021-06-28 (×6): 50 mg via ORAL
  Filled 2021-06-22 (×6): qty 1

## 2021-06-22 MED ORDER — LORAZEPAM 1 MG PO TABS
0.0000 mg | ORAL_TABLET | Freq: Two times a day (BID) | ORAL | Status: DC
Start: 2021-06-24 — End: 2021-06-22

## 2021-06-22 MED ORDER — FOLIC ACID 1 MG PO TABS
1.0000 mg | ORAL_TABLET | Freq: Every day | ORAL | Status: DC
  Administered 2021-06-23 – 2021-07-05 (×13): 1 mg via ORAL
  Filled 2021-06-22 (×16): qty 1

## 2021-06-22 MED ORDER — HYDROXYZINE HCL 25 MG PO TABS
25.0000 mg | ORAL_TABLET | Freq: Three times a day (TID) | ORAL | Status: DC | PRN
  Administered 2021-06-22 (×2): 25 mg via ORAL
  Filled 2021-06-22 (×2): qty 1

## 2021-06-22 MED ORDER — ALBUTEROL SULFATE HFA 108 (90 BASE) MCG/ACT IN AERS
1.0000 | INHALATION_SPRAY | Freq: Three times a day (TID) | RESPIRATORY_TRACT | Status: DC | PRN
  Administered 2021-06-26 – 2021-06-29 (×5): 1 via RESPIRATORY_TRACT
  Filled 2021-06-22: qty 6.7

## 2021-06-22 MED ORDER — IBUPROFEN 200 MG PO TABS: 400.0000 mg | ORAL_TABLET | Freq: Four times a day (QID) | ORAL | Status: DC | PRN

## 2021-06-22 NOTE — ED Notes (Signed)
Pt given breakfast tray

## 2021-06-22 NOTE — ED Provider Notes (Signed)
[facility]-EMERGENCY DEPT Provider Note   CSN: [id number] Arrival date & time: 06/22/21  0000     History Chief Complaint  Patient presents with   Paranoid    Stacy Poole is a 30 y.o. female with a history of schizoaffective disorder, PTSD, insomnia, anxiety, as well as polysubstance use who presents to the emergency department from [facility] for medical clearance with plan for inpatient psychiatric care.  Patient complaining of a rash to her bilateral hands since starting Lamictal, she previously took this medicine and did not have any problems with it, however when she started taking it she developed her rash which concerned her.  She has no other areas of rashes.  She denies alleviating or aggravating factors.  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  She does not consistently answer my questions. She denies SI/HI.  Level 5 caveat applies secondary to psychiatric condition.  HPI     Past Medical History:  Diagnosis Date   Acute ear infection    Anxiety    Arachnoid cyst    Fifth disease    Insomnia    Mental disorder    Mononucleosis    PTSD (post-traumatic stress disorder)    Substance abuse [facility])     Patient Active Problem List   Diagnosis Date Noted   Suicidal ideation    Schizoaffective disorder (HCC) 10/14/2019   Schizophrenia (HCC) 03/27/2019   Alcohol use disorder, severe, dependence (HCC) 03/05/2019   Alcohol abuse with alcohol-induced psychotic disorder, 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 No pertinent surgical history.   OB History   No obstetric  history on file.     History reviewed. No pertinent family history.  Social History   Tobacco Use   Smoking status: Every Day    Packs/day: 0.50    Years: 10.00    Pack years: 5.00    Types: Cigarettes   Smokeless tobacco: Never  Substance Use Topics   Alcohol use: Yes    Alcohol/week: 10.0 standard drinks    Types: 10 Shots of liquor per week    Comment: daily   Drug use: Yes    Types: Codeine, Benzodiazepines, Other-see comments    Home Medications Prior to Admission medications   Medication Sig Start Date End Date Taking? Authorizing Provider  zolpidem (AMBIEN) 10 MG tablet Take 10 mg by mouth at bedtime. 06/15/21  Yes [provider]  albuterol (VENTOLIN HFA) 108 (90 Base) MCG/ACT inhaler SMARTSIG:1 Puff(s) Via Inhaler Every 4 Hours PRN 06/15/21   [provider]  baclofen (LIORESAL) 10 MG tablet Take 1 tablet (10 mg total) by mouth 3 (three) times daily. Patient not taking: Reported on 06/22/2021 07/26/20 07/26/21  Court Joy, PA-C  busPIRone (BUSPAR) 15 MG tablet Take 7.5 mg by mouth 2 (two) times daily. Patient not taking: No sig reported 05/28/21   [provider]  FLUoxetine (PROZAC) 10 MG capsule Take 10 mg by mouth daily. 06/15/21   [provider]  FLUoxetine (PROZAC) 20 MG capsule Take 20 mg by mouth daily. Patient not taking: Reported on 06/22/2021 05/28/21   [provider]  prazosin (  MINIPRESS) 2 MG capsule Take 2 mg by mouth at bedtime. 06/15/21   [provider]  traZODone (DESYREL) 50 MG tablet Take 50 mg by mouth at bedtime as needed. Patient not taking: No sig reported 05/24/21   [provider]    Allergies    Pork-derived products and Tetracyclines & related  Review of Systems   Review of Systems  Unable to perform ROS: Psychiatric disorder  Skin:  Positive for rash.  Psychiatric/Behavioral:  Negative for suicidal ideas.    Physical Exam Updated Vital Signs BP (!) 146/91 (BP  Location: Left Arm)   Pulse 87   Temp 98.5 F (36.9 C) (Oral)   Resp 16   Wt 68.6 kg   SpO2 99%   BMI 26.78 kg/m   Physical Exam Vitals and nursing note reviewed.  HENT:     Head: Normocephalic and atraumatic.     Mouth/Throat:     Palate: No lesions.     Pharynx: Uvula midline. No pharyngeal swelling, oropharyngeal exudate or posterior oropharyngeal erythema.     Comments: No intraoral lesions noted. Eyes:     Pupils: Pupils are equal, round, and reactive to light.  Cardiovascular:     Rate and Rhythm: Normal rate and regular rhythm.  Pulmonary:     Effort: Pulmonary effort is normal.     Breath sounds: Normal breath sounds.  Abdominal:     General: There is no distension.     Palpations: Abdomen is soft.     Tenderness: There is no abdominal tenderness. There is no guarding or rebound.  Skin:    General: Skin is warm and dry.     Capillary Refill: Capillary refill takes less than 2 seconds.     Comments: Erythematous blanching  maculopapular rash noted to the dorsal aspect of the bilateral hands as pictured below.  Does not extend to the palms.  Appears isolated to the dorsal hands.  No sole involvement.  No mucous membrane involvement.  No sloughing.  No vesicles, no induration, no fluctuance.   Neurological:     Mental Status: She is alert.     Comments: Alert. Moving all extremities.   Psychiatric:        Mood and Affect: Mood is anxious.        Speech: Speech is rapid and pressured.       ED Results / Procedures / Treatments   Labs (all labs ordered are listed, but only abnormal results are displayed) Labs Reviewed  ACETAMINOPHEN LEVEL - Abnormal; Notable for the following components:      Result Value   Acetaminophen (Tylenol), Serum <10 (*)    All other components within normal limits  SALICYLATE LEVEL - Abnormal; Notable for the following components:   Salicylate Lvl <7.0 (*)    All other components within normal limits  RESP PANEL BY RT-PCR (FLU A&B,  COVID) ARPGX2  CBC  COMPREHENSIVE METABOLIC PANEL  ETHANOL  RAPID URINE DRUG SCREEN, HOSP PERFORMED  LAMOTRIGINE LEVEL  I-STAT BETA HCG BLOOD, ED (MC, WL, AP ONLY)    EKG None  Radiology No results found.  Procedures .Critical Care Performed by: Cherly Anderson, PA-C Authorized by: Cherly Anderson, PA-C    CRITICAL CARE Performed by: Harvie Heck   Total critical care time: 30 minutes  Critical care time was exclusive of separately billable procedures and treating other patients.  Critical care was necessary to treat or prevent imminent or life-threatening deterioration.  Critical care was time spent  personally by me on the following activities: development of treatment plan with patient and/or surrogate as well as nursing, discussions with consultants, evaluation of patient's response to treatment, examination of patient, obtaining history from patient or surrogate, ordering and performing treatments and interventions, ordering and review of laboratory studies, ordering and review of radiographic studies, pulse oximetry and re-evaluation of patient's condition.  Medications Ordered in ED Medications  FLUoxetine (PROZAC) capsule 10 mg (has no administration in time range)  prazosin (MINIPRESS) capsule 2 mg (has no administration in time range)  hydrOXYzine (ATARAX/VISTARIL) tablet 25 mg (25 mg Oral Given 06/22/21 0313)  traZODone (DESYREL) tablet 50 mg (50 mg Oral Patient Refused/Not Given 06/22/21 0313)  OLANZapine (ZYPREXA) tablet 5 mg (has no administration in time range)  OLANZapine (ZYPREXA) tablet 10 mg (has no administration in time range)  LORazepam (ATIVAN) injection 0-4 mg (0 mg Intravenous Not Given 06/22/21 0509)    Or  LORazepam (ATIVAN) tablet 0-4 mg ( Oral See Alternative 06/22/21 0509)  LORazepam (ATIVAN) injection 0-4 mg (has no administration in time range)    Or  LORazepam (ATIVAN) tablet 0-4 mg (has no administration in time range)   thiamine tablet 100 mg (has no administration in time range)    Or  thiamine (B-1) injection 100 mg (has no administration in time range)  acetaminophen (TYLENOL) tablet 650 mg (has no administration in time range)  nicotine (NICODERM CQ - dosed in mg/24 hours) patch 21 mg (has no administration in time range)  albuterol (VENTOLIN HFA) 108 (90 Base) MCG/ACT inhaler 1 puff (has no administration in time range)  folic acid (FOLVITE) tablet 1 mg (has no administration in time range)  ziprasidone (GEODON) injection 20 mg (20 mg Intramuscular Given 06/22/21 0425)  sterile water (preservative free) injection (1.2 mLs  Given 06/22/21 0425)    ED Course  I have reviewed the triage vital signs and the nursing notes.  Pertinent labs & imaging results that were available during my care of the patient were reviewed by me and considered in my medical decision making (see chart for details).    MDM Rules/Calculators/A&P                           Patient presents to the ED for medical clearance.  She was seen at behavioral health and recommended for inpatient psychiatric care, sent to the emergency department for medical clearance and continued observation as there are no current beds at [facility].  Patient is nontoxic, resting comfortably, vitals with elevated blood pressure, doubt hypertensive emergency. She is noted to have an erythematous blanching maculopapular rash isolated to the dorsal aspect of her bilateral hands which began when she started Lamictal.  She has no mucous membrane involvement.  There is no sloughing of the skin.  This does not appear consistent with Stevens-Johnson syndrome at this time.  Low suspicion for other life-threatening etiology, will have her discontinue Lamictal- discussed w/ attending in agreement, - updated PA Melbourne Abts with behavioral health regarding this- he has placed psych med orders.    Additional history obtained:  Additional history obtained from chart review &  nursing note review.   Lab Tests:  Screening labs have been reviewed including CBC, CMP, acetaminophen/salicylate/ethanol level, UDS: Unremarkable.   ED Course:  Patient is medically cleared.  Pending placement at this time.  04:10: Patient escalating in the hallway, yelling, showing signs of paranoia, will IVC at this time and administer geodon, Qtc <  450.   The patient has been placed in psychiatric observation due to the need to provide a safe environment for the patient while obtaining psychiatric consultation and evaluation, as well as ongoing medical and medication management to treat the patient's condition.  The patient has been placed under full IVC at this time.  Findings and plan of care discussed with supervising physician Dr. Bebe Shaggy who is in agreement.   Portions of this note were generated with Scientist, clinical (histocompatibility and immunogenetics). Dictation errors may occur despite best attempts at proofreading.  Final Clinical Impression(s) / ED Diagnoses Final diagnoses:  Acute psychosis [facility])    Rx / DC Orders ED Discharge Orders     None        Cherly Anderson, PA-C 06/22/21 1829    Zadie Rhine, MD 06/22/21 [phone]

## 2021-06-22 NOTE — ED Notes (Signed)
Gave pt belonging bag to [facility] [employer] to transport with pt to [facility].

## 2021-06-22 NOTE — Consult Note (Addendum)
  Patient presented to [facility] as a walk in last night, and was seen and assessed by TTS. Patient continues to present with tangential thought processes and pressured speech. She continues to remain delusional and grandiose, at this time. She is observed to be thought blocking, and at times consciously watches what she says per nursing staff. She was started back on her medications last night by EDP.   -Continue to recommend inpatient psychiatric admission at this time. Contact has been initiated at [facility] for possible admission. Patient has been accepted to [facility] under the care of Dr. Loleta Chance. Covid test was obtain at 0158 and is negative, will not repeat.  -Continue current medications. -Transfer orders have been placed. Will notify [place] Sheriff for transport.
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
## 2021-06-22 NOTE — Tx Team (Signed)
Initial Treatment Plan 06/22/2021 3:29 PM [facility] EPP:[id number]    PATIENT STRESSORS: Health problems   Medication change or noncompliance   Traumatic event     PATIENT STRENGTHS: Ability for insight  Communication skills  Physical Health  Supportive family/friends    PATIENT IDENTIFIED PROBLEMS: anxiety  paranoia  Pressured speech  "Medication change"               DISCHARGE CRITERIA:  Ability to meet basic life and health needs Improved stabilization in mood, thinking, and/or behavior Motivation to continue treatment in a less acute level of care Need for constant or close observation no longer present  PRELIMINARY DISCHARGE PLAN: Attend aftercare/continuing care group Outpatient therapy Return to previous living arrangement  PATIENT/FAMILY INVOLVEMENT: This treatment plan has been presented to and reviewed with the patient, KORRIE HOFBAUER.  The patient and family have been given the opportunity to ask questions and make suggestions.  Raylene Miyamoto, RN 06/22/2021, 3:29 PM

## 2021-06-22 NOTE — Progress Notes (Signed)
Pt did not attend therapeutic relaxation group. Pt ws just admitted and arrived on the unit.

## 2021-06-22 NOTE — ED Notes (Signed)
Patient is up to the bathroom again. 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

## 2021-06-22 NOTE — ED Notes (Signed)
Pt states that she must go to the bathroom because she smells Nazis. Pt pointed to the bathroom.

## 2021-06-22 NOTE — BHH Group Notes (Signed)
Adult Psychoeducational Group Note  Date:  06/22/2021 Time:  5:15 PM  Group Topic/Focus:  Managing Feelings:   The focus of this group is to identify what feelings patients have difficulty handling and develop a plan to handle them in a healthier way upon discharge.  Participation Level:  Active  Participation Quality:  Intrusive  Affect:  Defensive and Resistant  Cognitive:  Delusional  Insight: Lacking  Engagement in Group:  Lacking  Modes of Intervention:  Discussion  Additional Comments:  Pt was arguing with another patient and couldn't fully participate in group  Donell Beers 06/22/2021, 5:15 PM

## 2021-06-22 NOTE — ED Notes (Signed)
Patient is in the bathroom
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
## 2021-06-22 NOTE — BH Assessment (Signed)
BHH Assessment Progress Note   Per Caryn Bee, NP, this pt requires psychiatric hospitalization.  Danika, RN, [facility] has assigned pt to [facility] Rm 503-1 to the service of Dr Loleta Chance.  B[facility] will be ready to receive pt at 14:00.  Pt presents under IVC initiated by EDP Zadie Rhine, and IVC documents have been faxed to [facility].  EDP Mancel Bale, MD and pt's nurses, Abby and Garald Balding, have been notified.  Please call report to [phone].  Pt is to be transported via [employer].   Doylene Canning, [place] [facility] Coordinator [phone]

## 2021-06-22 NOTE — ED Notes (Signed)
Patient has loud outburst stating she is triggered by certain patients that walk by. Patient is also currently singing.

## 2021-06-22 NOTE — ED Notes (Signed)
Pt is dressed out. Pt clothes are in cabinet 16-18.

## 2021-06-22 NOTE — ED Triage Notes (Signed)
Patient reports feelings of paranoia, she states "I am being stalked by the [facility]" when asked to further explain how she is feeling, pt states "I cannot talk about it" unable to obtain more information from patient. Patient also endorses feeling pregnant.

## 2021-06-22 NOTE — [facility] Group Notes (Signed)
Pt didn't attend group. 

## 2021-06-22 NOTE — ED Notes (Signed)
Patient has occasional loud outburst. Patient was advised that other patients are trying to rest.

## 2021-06-22 NOTE — ED Notes (Signed)
Introduced myself to patient. She no SI/HI ideation

## 2021-06-23 LAB — LAMOTRIGINE LEVEL: Lamotrigine Lvl: <1 ug/mL — ABNORMAL LOW (ref 2.0–20.0)

## 2021-06-23 MED ORDER — ZIPRASIDONE MESYLATE 20 MG IM SOLR
20.0000 mg | INTRAMUSCULAR | Status: AC | PRN
  Administered 2021-06-26: 20 mg via INTRAMUSCULAR
  Filled 2021-06-23: qty 20

## 2021-06-23 MED ORDER — OLANZAPINE 10 MG PO TBDP
10.0000 mg | ORAL_TABLET | Freq: Three times a day (TID) | ORAL | Status: DC | PRN
  Administered 2021-06-24 – 2021-06-28 (×5): 10 mg via ORAL
  Filled 2021-06-23 (×5): qty 1

## 2021-06-23 MED ORDER — LORAZEPAM 1 MG PO TABS
1.0000 mg | ORAL_TABLET | ORAL | Status: AC | PRN
  Administered 2021-06-28: 1 mg via ORAL
  Filled 2021-06-23: qty 1

## 2021-06-23 MED ORDER — FAMOTIDINE 20 MG PO TABS
20.0000 mg | ORAL_TABLET | Freq: Every day | ORAL | Status: DC
  Administered 2021-06-24 – 2021-07-05 (×12): 20 mg via ORAL
  Filled 2021-06-23 (×16): qty 1

## 2021-06-23 NOTE — Progress Notes (Signed)
Progress note  Pt's mood isn't as labile as when first admitted. Pt has become more pleasant today. Pt still has delusions including being in the [employer]. Pt is also extremely somatic and can be somatic in this sense. Pt denies si/hi/ah/vh and verbally agrees to approach staff if these become apparent or before harming themselves/others while at [facility].  A: Pt provided support and encouragement. Pt given medication per protocol and standing orders. Q58m safety checks implemented and continued.  R: Pt safe on the unit. Will continue to monitor.

## 2021-06-23 NOTE — [facility] Group Notes (Signed)
Adult Psychoeducational Group Note  Date:  06/23/2021 Time:  10:24 PM  Group Topic/Focus:  Building Self Esteem:   The Focus of this group is helping patients become aware of the effects of self-esteem on their lives, the things they and others do that enhance or undermine their self-esteem, seeing the relationship between their level of self-esteem and the choices they make and learning ways to enhance self-esteem.  Participation Level:  Active  Participation Quality:  Appropriate  Affect:  Appropriate  Cognitive:  Alert  Insight: Improving  Engagement in Group:  Engaged  Modes of Intervention:  Discussion  Additional Comments  Jacalyn Lefevre 06/23/2021, 10:24 PM

## 2021-06-23 NOTE — Progress Notes (Signed)
Patient is very aggitated stating that " the [employer] gang is looking for me  they killed my friend last year".  Patient  speech is rapid, and very argumentative. She is pacing holding her heard and crying loud. " I am drowning my head and my mind is hurting I am in mental anguish"  Scheduled medications given and Prn atarax 25 mg, and trazodone 50 mg for sleep disturbances. Medications not effective    Patient continues to pace in and out of her room taking  showers coming out of her room crying about the [employer]. Provider notified and assessed the Patient OTO of ativan 1 mg given at 2141. Patient calm by 2300. Taking naps and coming out of the room requesting to take more showers. Patient required redirection most of the night.   Patient woke up at 310 am requesting for antipsychotic. She is anxious loud and cursing, prn vistaril 25 mg PO given and Patient went back to her room with no redirection required.   Support and encouragement provided. No adverse drug noted.

## 2021-06-23 NOTE — Group Note (Signed)
Recreation Therapy Group Note   Group Topic:Other  Group Date: 06/23/2021 Start Time: 1000 End Time: 1020 Facilitators: [name] Rancher, LRT/CTRS Location: [address] Dayroom  Goal Area(s) Addresses:  Patient will successfully identify positive attributes about themselves.  Patient will successfully identify benefit of improved self-esteem.   Group Description: Letter to Me.  Patients were to write a letter to their younger selves.  Patients were to identify things they have learned, what they would do different and any words of encouragement to themselves.   Affect/Mood: Anxious   Participation Level: Hyperverbal   Participation Quality: Independent   Behavior: Hyperverbal   Speech/Thought Process: Delusional   Insight: Limited   Judgement: Limited   Modes of Intervention: Activity   Patient Response to Interventions:  Attentive   Education Outcome:  Acknowledges education and In group clarification offered    Clinical Observations/Individualized Feedback: Pt was attentive during group.  Pt was hyperverbal and off topic of what group was about.  Pt talked about people working together and stop division because war is going to happen soon if people don't work together.  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  Pt was very political.  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    Plan: Continue to engage patient in RT group sessions 2-3x/week.   [name] Rancher, LRT/CTRS 06/23/2021 11:05 AM

## 2021-06-23 NOTE — H&P (Signed)
Psychiatric Admission Assessment Adult  Patient Identification: Stacy Poole MRN:  [id number] Date of Evaluation:  06/23/2021 Chief Complaint:  Bipolar affective disorder, currently manic, severe, with psychotic features (HCC) [F31.2] Principal Diagnosis: <principal problem not specified> Diagnosis:  Active Problems:   Bipolar affective disorder, currently manic, severe, with psychotic features (HCC)  History of Present Illness: Patient is a 30 year old female with documented past psychiatric history of schizoaffective disorder, bipolar type, severe alcohol use disorder, cannabis use disorder, stimulant induced psychotic disorder with hallucinations, and reported history of pseudotumor cerebri presents to [facility] involuntarily due to erratic behavior, paranoia, and delusions.  CHART REVIEW Patient has had multiple psychiatric hospitalizations. Per patient, patient's most recent psychiatric hospitalization was at old [place] 1 week ago. Pertinent labs prior to hospitalization include: Negative UDS, WNL CBC, WNL CMP, negative alcohol, negative beta hCG, <1.0 lamotrigine level  TODAY'S INTERVIEW Patient seen and assessed with attending Dr. Loleta Chance.  Patient states that she had just been released from [facility] last week and that she is here because there has been break-ins at her apartment and that led her to be feeling very paranoid.  Patient also states that she has not gotten her hydroxyzine which was another reason as to why she was here.  Patient did not want to divulge what was going on at her apartment, just that she "had it dealt with".  When asked whether she had notified the police regarding the break-ins and stalker situation, patient states that she has.  Patient states that she has had a brain fog due to Zyprexa as well as the multiple head injuries that she states she has had due to being a "rough kid" and from soccer.  Patient also states that she was placed under IVC because he was for  "her own security".  Patient briefly mentions that she works for [employer] but immediately says "I should not have said that".  Patient repeatedly has to tell her self that she is "safe here".  Beyond "brain fog", patient states that she has diagnosis of pseudotumor cerebri.  This has not been documented by any brain imaging that she has done; however, her last CT/MRI was done in 2014.  Patient denies any physical complaints at this time beyond the brain fog.  Patient states that she would like to not have an extended stay.  I assured patient that we will assess her daily.  Patient states that she has a psychiatrist Dr. [name] as well as sees 2 therapists weekly.  Patient states that she has a PTSD diagnosis and states that she has flashbacks/dissociations and nightmares.  Patient takes prazosin for this as she states she regularly has nightmares that affects her sleep.  Patient denies present SI/HI/AVH.  Patient denies ideas of reference but appears to actively have delusions and paranoia regarding her home situation.  Plan to call collateral mother tomorrow.  Patient has verbally agreed to this.  Patient states she lives by her self in her apartment.  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  Patient denies significant substance abuse after discharge from [facility] but did state that she resumed marijuana use once and drank a Guiness.  Patient appears to minimize her substance use but does state that she predominately does no longer uses illicit substances.      Associated Signs/Symptoms: Depression Symptoms:  insomnia, Duration of Depression Symptoms: No data recorded (Hypo) Manic Symptoms:  Delusions, Elevated Mood, Grandiosity, Impulsivity, Anxiety Symptoms:  none Psychotic Symptoms:  Delusions, Paranoia, PTSD Symptoms: Had a traumatic exposure:  unspecified Total Time spent with patient:  I personally spent 60 minutes on  the unit in direct patient care. The direct patient care time included face-to-face time with the patient, reviewing the patient's chart, communicating with other professionals, and coordinating care. Greater than 50% of this time was spent in counseling or coordinating care with the patient regarding goals of hospitalization, psycho-education, and discharge planning needs.   Past Psychiatric History: see H&P  Is the patient at risk to self? Yes.    Has the patient been a risk to self in the past 6 months? Yes.    Has the patient been a risk to self within the distant past? Yes.    Is the patient a risk to others? No.  Has the patient been a risk to others in the past 6 months? No.  Has the patient been a risk to others within the distant past? No.   Prior Inpatient Therapy:  yes Prior Outpatient Therapy:  yes  Alcohol Screening: 1. How often do you have a drink containing alcohol?: 2 to 4 times a month 2. How many drinks containing alcohol do you have on a typical day when you are drinking?: 3 or 4 3. How often do you have six or more drinks on one occasion?: Never AUDIT-C Score: 3 4. How often during the last year have you found that you were not able to stop drinking once you had started?: Never 5. How often during the last year have you failed to do what was normally expected from you because of drinking?: Never 6. How often during the last year have you needed a first drink in the morning to get yourself going after a heavy drinking session?: Never 7. How often during the last year have you had a feeling of guilt of remorse after drinking?: Never 8. How often during the last year have you been unable to remember what happened the night before because you had been drinking?: Never 9. Have you or someone else been injured as a result of your drinking?: No 10. Has a relative or friend or a doctor or another health worker been concerned about your drinking or suggested you cut down?:  No Alcohol Use Disorder Identification Test Final Score (AUDIT): 3 Substance Abuse History in the last 12 months:  Yes.   Consequences of Substance Abuse: Medical Consequences:  multiple ED visits Previous Psychotropic Medications: Yes  Psychological Evaluations: No  Past Medical History:  Past Medical History:  Diagnosis Date   Acute ear infection    Anxiety    Arachnoid cyst    Fifth disease    Insomnia    Mental disorder    Mononucleosis    PTSD (post-traumatic stress disorder)    Substance abuse (HCC)    History reviewed. No pertinent surgical history. Family History: History reviewed. No pertinent family history. Family Psychiatric  History: none reported Tobacco Screening:  no Social History:  Social History   Substance and Sexual Activity  Alcohol Use Not Currently   Comment: socially     Social History   Substance and Sexual Activity  Drug Use Not Currently   Comment: pt denies, + cannabis    Additional Social History:                           Allergies:   Allergies  Allergen Reactions   Lamictal [Lamotrigine]  Rash   Pork-Derived Products Other (See Comments)    Caused her to be hospitalized as a child - reaction unknown   Tetracyclines & Related Other (See Comments)    Ear popping, couldn't move neck/back, and had blind spots/double vision   Lab Results:  Results for orders placed or performed during the hospital encounter of 06/22/21 (from the past 48 hour(s))  Urine rapid drug screen (hosp performed)     Status: None   Collection Time: 06/22/21 12:25 AM  Result Value Ref Range   Opiates NONE DETECTED NONE DETECTED   Cocaine NONE DETECTED NONE DETECTED   Benzodiazepines NONE DETECTED NONE DETECTED   Amphetamines NONE DETECTED NONE DETECTED   Tetrahydrocannabinol NONE DETECTED NONE DETECTED   Barbiturates NONE DETECTED NONE DETECTED    Comment: (NOTE) DRUG SCREEN FOR MEDICAL PURPOSES ONLY.  IF CONFIRMATION IS NEEDED FOR ANY PURPOSE,  NOTIFY LAB WITHIN 5 DAYS.  LOWEST DETECTABLE LIMITS FOR URINE DRUG SCREEN Drug Class                     Cutoff (ng/mL) Amphetamine and metabolites    1000 Barbiturate and metabolites    200 Benzodiazepine                 200 Tricyclics and metabolites     300 Opiates and metabolites        300 Cocaine and metabolites        300 THC                            50 Performed at [facility], [address], [place], [place] [address]   CBC     Status: None   Collection Time: 06/22/21  1:21 AM  Result Value Ref Range   WBC 8.8 4.0 - 10.5 K/uL   RBC 4.49 3.87 - 5.11 MIL/uL   Hemoglobin 13.3 12.0 - 15.0 g/dL   HCT 60.4 54.0 - 98.1 %   MCV 90.0 80.0 - 100.0 fL   MCH 29.6 26.0 - 34.0 pg   MCHC 32.9 30.0 - 36.0 g/dL   RDW 19.1 47.8 - 29.5 %   Platelets 390 150 - 400 K/uL   nRBC 0.0 0.0 - 0.2 %    Comment: Performed at [facility], [address] [place], [place] [address]  Comprehensive metabolic panel     Status: None   Collection Time: 06/22/21  1:21 AM  Result Value Ref Range   Sodium 139 135 - 145 mmol/L   Potassium 3.7 3.5 - 5.1 mmol/L   Chloride 104 98 - 111 mmol/L   CO2 28 22 - 32 mmol/L   Glucose, Bld 91 70 - 99 mg/dL    Comment: Glucose reference range applies only to samples taken after fasting for at least 8 hours.   BUN 10 6 - 20 mg/dL   Creatinine, Ser 8.65 0.44 - 1.00 mg/dL   Calcium 9.5 8.9 - 78.4 mg/dL   Total Protein 7.5 6.5 - 8.1 g/dL   Albumin 4.3 3.5 - 5.0 g/dL   AST 22 15 - 41 U/L   ALT 22 0 - 44 U/L   Alkaline Phosphatase 56 38 - 126 U/L   Total Bilirubin 0.6 0.3 - 1.2 mg/dL   GFR, Estimated >69 >62 mL/min    Comment: (NOTE) Calculated using the CKD-EPI Creatinine Equation (2021)    Anion gap 7 5 - 15  Comment: Performed at [facility], [address], [place], [place] [address]  Ethanol     Status: None   Collection Time: 06/22/21  1:21 AM  Result Value Ref Range   Alcohol, Ethyl (B)  <10 <10 mg/dL    Comment: (NOTE) Lowest detectable limit for serum alcohol is 10 mg/dL.  For medical purposes only. Performed at [facility], [address], [place], [place] [address]   Acetaminophen level     Status: Abnormal   Collection Time: 06/22/21  1:21 AM  Result Value Ref Range   Acetaminophen (Tylenol), Serum <10 (L) 10 - 30 ug/mL    Comment: (NOTE) Therapeutic concentrations vary significantly. A range of 10-30 ug/mL  may be an effective concentration for many patients. However, some  are best treated at concentrations outside of this range. Acetaminophen concentrations >150 ug/mL at 4 hours after ingestion  and >50 ug/mL at 12 hours after ingestion are often associated with  toxic reactions.  Performed at [facility], [address], [place], [place] [address]   Salicylate level     Status: Abnormal   Collection Time: 06/22/21  1:21 AM  Result Value Ref Range   Salicylate Lvl <7.0 (L) 7.0 - 30.0 mg/dL    Comment: Performed at [facility], [address], [place], [place] [address]  I-Stat beta hCG blood, ED     Status: None   Collection Time: 06/22/21  1:34 AM  Result Value Ref Range   I-stat hCG, quantitative <5.0 <5 mIU/mL   Comment 3            Comment:   GEST. AGE      CONC.  (mIU/mL)   <=1 WEEK        5 - 50     2 WEEKS       50 - 500     3 WEEKS       100 - 10,000     4 WEEKS     1,000 - 30,000        FEMALE AND NON-PREGNANT FEMALE:     LESS THAN 5 mIU/mL   Resp Panel by RT-PCR (Flu A&B, Covid)     Status: None   Collection Time: 06/22/21  1:58 AM   Specimen: Nasopharyngeal(NP) swabs in vial transport medium  Result Value Ref Range   SARS Coronavirus 2 by RT PCR NEGATIVE NEGATIVE    Comment: (NOTE) SARS-CoV-2 target nucleic acids are NOT DETECTED.  The SARS-CoV-2 RNA is generally detectable in upper respiratory specimens during the acute phase of infection. The lowest concentration of  SARS-CoV-2 viral copies this assay can detect is 138 copies/mL. A negative result does not preclude SARS-Cov-2 infection and should not be used as the sole basis for treatment or other patient management decisions. A negative result may occur with  improper specimen collection/handling, submission of specimen other than nasopharyngeal swab, presence of viral mutation(s) within the areas targeted by this assay, and inadequate number of viral copies(<138 copies/mL). A negative result must be combined with clinical observations, patient history, and epidemiological information. The expected result is Negative.  Fact Sheet for Patients:  BloggerCourse.com  Fact Sheet for Healthcare Providers:  SeriousBroker.it  This test is no t yet approved or cleared by the [place] FDA and  has been authorized for detection and/or diagnosis of SARS-CoV-2 by FDA under an Emergency Use Authorization (EUA). This EUA will remain  in effect (meaning this test can  be used) for the duration of the COVID-19 declaration under Section 564(b)(1) of the Act, 21 U.S.C.section 360bbb-3(b)(1), unless the authorization is terminated  or revoked sooner.       Influenza A by PCR NEGATIVE NEGATIVE   Influenza B by PCR NEGATIVE NEGATIVE    Comment: (NOTE) The Xpert Xpress SARS-CoV-2/FLU/RSV plus assay is intended as an aid in the diagnosis of influenza from Nasopharyngeal swab specimens and should not be used as a sole basis for treatment. Nasal washings and aspirates are unacceptable for Xpert Xpress SARS-CoV-2/FLU/RSV testing.  Fact Sheet for Patients: BloggerCourse.com  Fact Sheet for Healthcare Providers: SeriousBroker.it  This test is not yet approved or cleared by the [place] FDA and has been authorized for detection and/or diagnosis of SARS-CoV-2 by FDA under an Emergency Use Authorization (EUA).  This EUA will remain in effect (meaning this test can be used) for the duration of the COVID-19 declaration under Section 564(b)(1) of the Act, 21 U.S.C. section 360bbb-3(b)(1), unless the authorization is terminated or revoked.  Performed at [facility], [address] [place], [place] [address]   Lamotrigine level     Status: Abnormal   Collection Time: 06/22/21  3:20 AM  Result Value Ref Range   Lamotrigine Lvl <1.0 (L) 2.0 - 20.0 ug/mL    Comment: (NOTE)                                Detection Limit = 1.0 Performed At: [facility] [address] [place], [place] [id number] Jolene Schimke MD FS:[id number]     Blood Alcohol level:  Lab Results  Component Value Date   [facility] <10 06/22/2021   ETH <10 06/01/2021    Metabolic Disorder Labs:  Lab Results  Component Value Date   HGBA1C 5.0 06/01/2021   MPG 96.8 06/01/2021   MPG 99.67 10/15/2019   No results found for: PROLACTIN Lab Results  Component Value Date   CHOL 162 06/01/2021   TRIG 52 06/01/2021   HDL 107 06/01/2021   CHOLHDL 1.5 06/01/2021   VLDL 10 06/01/2021   LDLCALC 45 06/01/2021   LDLCALC 71 10/15/2019    Current Medications: Current Facility-Administered Medications  Medication Dose Route Frequency Provider Last Rate Last Admin   acetaminophen (TYLENOL) tablet 650 mg  650 mg Oral Q4H PRN Starkes-Perry, Juel Burrow, FNP       albuterol (VENTOLIN HFA) 108 (90 Base) MCG/ACT inhaler 1 puff  1 puff Inhalation TID PRN Maryagnes Amos, FNP       alum & mag hydroxide-simeth (MAALOX/MYLANTA) 200-200-20 MG/5ML suspension 30 mL  30 mL Oral Q4H PRN Maryagnes Amos, FNP   30 mL at 06/23/21 1309   folic acid (FOLVITE) tablet 1 mg  1 mg Oral Daily Maryagnes Amos, FNP   1 mg at 06/23/21 0741   hydrOXYzine (ATARAX/VISTARIL) tablet 25 mg  25 mg Oral TID PRN Maryagnes Amos, FNP   25 mg at 06/23/21 1205   ibuprofen (ADVIL) tablet 600 mg  600 mg Oral Q6H PRN Nira Conn A, NP   600 mg at 06/23/21 1309   OLANZapine zydis (ZYPREXA) disintegrating tablet 10 mg  10 mg Oral Q8H PRN Park Pope, MD       And   LORazepam (ATIVAN) tablet 1 mg  1 mg Oral PRN Park Pope, MD       And   ziprasidone (GEODON) injection 20 mg  20 mg Intramuscular PRN Park Pope, MD       magnesium hydroxide (MILK OF MAGNESIA) suspension 30 mL  30 mL Oral Daily PRN Starkes-Perry, Juel Burrow, FNP       nicotine (NICODERM CQ - dosed in mg/24 hours) patch 21 mg  21 mg Transdermal Daily PRN Maryagnes Amos, FNP   21 mg at 06/23/21 0816   OLANZapine (ZYPREXA) tablet 10 mg  10 mg Oral QHS Maryagnes Amos, FNP   10 mg at 06/22/21 1956   OLANZapine (ZYPREXA) tablet 5 mg  5 mg Oral Daily Maryagnes Amos, FNP   5 mg at 06/23/21 0741   prazosin (MINIPRESS) capsule 2 mg  2 mg Oral QHS Maryagnes Amos, FNP   2 mg at 06/22/21 1957   traZODone (DESYREL) tablet 50 mg  50 mg Oral QHS PRN Maryagnes Amos, FNP   50 mg at 06/22/21 2112   PTA Medications: Medications Prior to Admission  Medication Sig Dispense Refill Last Dose   albuterol (VENTOLIN HFA) 108 (90 Base) MCG/ACT inhaler Inhale 1 puff into the lungs 3 (three) times daily as needed for wheezing.      FLUoxetine (PROZAC) 10 MG capsule Take 10 mg by mouth daily.      folic acid (FOLVITE) 1 MG tablet Take 1 mg by mouth daily.      prazosin (MINIPRESS) 2 MG capsule Take 2 mg by mouth daily.      zolpidem (AMBIEN) 10 MG tablet Take 10 mg by mouth at bedtime.       Musculoskeletal: Strength & Muscle Tone: within normal limits Gait & Station: normal Patient leans: N/A            Psychiatric Specialty Exam:  Presentation  General Appearance: Disheveled; Appropriate for Environment  Eye Contact:Fleeting; Fair  Speech:Pressured  Speech Volume:Increased  Handedness:Right   Mood and Affect  Mood:Irritable  Affect:Congruent; Appropriate   Thought Process  Thought  Processes:Disorganized  Duration of Psychotic Symptoms: Less than six months  Past Diagnosis of Schizophrenia or Psychoactive disorder: Yes  Descriptions of Associations:Tangential  Orientation:Partial  Thought Content:Paranoid Ideation; Delusions; Tangential; Scattered  Hallucinations:Hallucinations: None  Ideas of Reference:Paranoia; Delusions  Suicidal Thoughts:Suicidal Thoughts: No  Homicidal Thoughts:Homicidal Thoughts: No   Sensorium  Memory:Immediate Fair; Recent Fair; Remote Fair  Judgment:Poor  Insight:Poor; Lacking   Executive Functions  Concentration:Poor  Attention Span:Poor  Recall:Fair  Fund of Knowledge:Fair  Language:Fair   Psychomotor Activity  Psychomotor Activity:Psychomotor Activity: Restlessness; Increased   Assets  Assets:Communication Skills; Housing; Leisure Time; Physical Health; Social Support   Sleep  Sleep:Sleep: Fair    Physical Exam: Physical Exam Vitals and nursing note reviewed.  Constitutional:      Appearance: Normal appearance. She is normal weight.  HENT:     Head: Normocephalic and atraumatic.  Pulmonary:     Effort: Pulmonary effort is normal.  Neurological:     General: No focal deficit present.     Mental Status: She is oriented to person, place, and time.   Review of Systems  Respiratory:  Negative for shortness of breath.   Cardiovascular:  Negative for chest pain.  Gastrointestinal:  Negative for abdominal pain, constipation, diarrhea, heartburn, nausea and vomiting.  Neurological:  Negative for headaches.  Blood pressure 122/85, pulse 89, temperature 98.7 F (37.1 C), temperature source Oral, resp. rate 18, height  (1.651 m), weight 68.5 kg, SpO2 94 %. Body mass index is 25.13 kg/m.  Treatment Plan Summary: Daily contact with patient  to assess and evaluate symptoms and progress in treatment and Medication management  ASSESSMENT Patient is a 30 year old female with documented past  psychiatric history of schizoaffective disorder, bipolar type, severe alcohol use disorder, cannabis use disorder, stimulant induced psychotic disorder with hallucinations, and reported history of pseudotumor cerebri presents to [facility] involuntarily due to erratic behavior, paranoia, and delusions.  Given refractory delusions and paranoia, patient will likely need adjunct antipsychotic or different antipsychotic.  PLAN Psychiatric Problems Schizoaffective Disorder, Bipolar Type PTSD Polysubstance abuse Resumed Home Meds: Zyprexa 10 qhs, 5 qam for psychotic symptoms Minipress 2 mg qhs for nightmares Nicoderm 21 mg prn for NRT On Agitation protocol   Medical Problems Reported Pseudotumor Cerebri: ordered CT scan to assess  PRNs Tylenol 650 mg for mild pain Maalox/Mylanta 30 mL for indigestion Bendaryl q6hr for itching/allergies Hydroxyzine 25 mg tid for anxiety Milk of Magnesia 30 mL for constipation Trazodone 50 mg for sleep  3. Safety and Monitoring: Involuntary admission to inpatient psychiatric unit for safety, stabilization and treatment Daily contact with patient to assess and evaluate symptoms and progress in treatment Patient's case to be discussed in multi-disciplinary team meeting Observation Level : q15 minute checks Vital signs: q12 hours Precautions: suicide, elopement, and assault   4. Discharge Planning: Social work and case management to assist with discharge planning and identification of hospital follow-up needs prior to discharge Estimated LOS: 5-7 days Discharge Concerns: Need to establish a safety plan; Medication compliance and effectiveness Discharge Goals: Return home with outpatient referrals for mental health follow-up including medication management/psychotherapy  Observation Level/Precautions:  15 minute checks  Laboratory:  CBC Chemistry Profile Folic Acid  Psychotherapy:    Medications:    Consultations:    Discharge Concerns:    Estimated LOS:   Other:     Physician Treatment Plan for Primary Diagnosis: <principal problem not specified> Long Term Goal(s): Improvement in symptoms so as ready for discharge  Short Term Goals: Ability to identify changes in lifestyle to reduce recurrence of condition will improve, Ability to verbalize feelings will improve, Ability to demonstrate self-control will improve, Ability to identify and develop effective coping behaviors will improve, Ability to maintain clinical measurements within normal limits will improve, Compliance with prescribed medications will improve, and Ability to identify triggers associated with substance abuse/mental health issues will improve  Physician Treatment Plan for Secondary Diagnosis: Active Problems:   Bipolar affective disorder, currently manic, severe, with psychotic features (HCC)  Long Term Goal(s): Improvement in symptoms so as ready for discharge  Short Term Goals: Ability to identify changes in lifestyle to reduce recurrence of condition will improve, Ability to verbalize feelings will improve, Ability to demonstrate self-control will improve, Ability to identify and develop effective coping behaviors will improve, Ability to maintain clinical measurements within normal limits will improve, Compliance with prescribed medications will improve, and Ability to identify triggers associated with substance abuse/mental health issues will improve  I certify that inpatient services furnished can reasonably be expected to improve the patient's condition.    Park Pope, MD 10/20/20226:30 PM

## 2021-06-23 NOTE — [facility] Suicide Risk Assessment (Signed)
[facility] Admission Suicide Risk Assessment   Nursing information obtained from:  Patient, Review of record Demographic factors:  Living alone, Adolescent or young adult Current Mental Status:  NA Loss Factors:  Decline in physical health Historical Factors:  Impulsivity Risk Reduction Factors:  Positive social support, Positive coping skills or problem solving skills, Positive therapeutic relationship  Total Time spent with patient: 45 minutes Principal Problem: <principal problem not specified> Diagnosis:  Active Problems:   Bipolar affective disorder, currently manic, severe, with psychotic features (HCC)  Subjective Data: Stacy Poole presents about a week after discharge from [facility], unable to make it to her outpatient appointment. She is having difficulties managing medications, and "dealing with some break-in issues", and is clearly elevated, hyperverbal, tangential and delusional. She she endorses multiple somatic complaints, memory complaints, nightmares/flashbacks, poor sleep, labile mood, poor appetite, anxiety and feeling unsafe at home. She denies thoughts of harm tos self or others. No current hallucinations. She has been using cannabis recently.   Continued Clinical Symptoms:  Alcohol Use Disorder Identification Test Final Score (AUDIT): 3 The "Alcohol Use Disorders Identification Test", Guidelines for Use in Primary Care, Second Edition.  World Science writer [facility]). Score between 0-7:  no or low risk or alcohol related problems. Score between 8-15:  moderate risk of alcohol related problems. Score between 16-19:  high risk of alcohol related problems. Score 20 or above:  warrants further diagnostic evaluation for alcohol dependence and treatment.   CLINICAL FACTORS:   Severe Anxiety and/or Agitation Bipolar Disorder:   Mixed State Alcohol/Substance Abuse/Dependencies Currently Psychotic Previous Psychiatric Diagnoses and Treatments   Musculoskeletal: Strength &  Muscle Tone: within normal limits Gait & Station: normal Patient leans: N/A  Psychiatric Specialty Exam:  Presentation  General Appearance: Disheveled; Bizarre  Eye Contact:Fleeting; Fair  Speech:Pressured  Speech Volume:Increased  Handedness:Right   Mood and Affect  Mood:Irritable  Affect:Congruent; Inappropriate; Labile   Thought Process  Thought Processes:Disorganized  Descriptions of Associations:Tangential  Orientation:Partial (Patient alert and oriented to person and place, but not to time (states that the current date is 1991-05-11))  Thought Content:Paranoid Ideation; Delusions; Tangential; Scattered  History of Schizophrenia/Schizoaffective disorder:Yes  Duration of Psychotic Symptoms:Less than six months  Hallucinations:No data recorded Ideas of Reference:Paranoia; Delusions  Suicidal Thoughts:No data recorded Homicidal Thoughts:No data recorded  Sensorium  Memory:Immediate Fair; Recent Fair; Remote Fair  Judgment:Poor  Insight:Poor; Lacking   Executive Functions  Concentration:Poor  Attention Span:Poor  Recall:Fair  Fund of Knowledge:Fair  Language:Fair   Psychomotor Activity  Psychomotor Activity: No data recorded  Assets  Assets:Communication Skills; Housing; Leisure Time; Physical Health; Social Support   Sleep  Sleep: No data recorded   Physical Exam: Physical Exam Vitals and nursing note reviewed.  Constitutional:      Appearance: Normal appearance.  HENT:     Head: Normocephalic and atraumatic.     Nose: Nose normal.  Eyes:     Extraocular Movements: Extraocular movements intact.  Pulmonary:     Effort: Pulmonary effort is normal.  Musculoskeletal:        General: Normal range of motion.     Cervical back: Normal range of motion.  Neurological:     General: No focal deficit present.     Mental Status: She is alert and oriented to person, place, and time.   Review of Systems  Constitutional:  Positive  for malaise/fatigue.  HENT:  Positive for sore throat.   Eyes:        "Blind spots"  Respiratory:  Positive for wheezing.   Cardiovascular:  Positive for chest pain.  Gastrointestinal:  Positive for heartburn.  Genitourinary:        Retention  Musculoskeletal:  Positive for back pain and myalgias.  Skin:  Positive for rash.  Neurological:  Positive for dizziness and headaches.  Endo/Heme/Allergies:  Positive for environmental allergies.  Psychiatric/Behavioral:  Positive for memory loss and substance abuse. The patient is nervous/anxious and has insomnia.        Mania  Blood pressure 122/85, pulse 89, temperature 98.7 F (37.1 C), temperature source Oral, resp. rate 18, height 5\' 5"  (1.651 m), weight 68.5 kg, SpO2 94 %. Body mass index is 25.13 kg/m.   COGNITIVE FEATURES THAT CONTRIBUTE TO RISK:  Loss of executive function    SUICIDE RISK:   Mild:  Suicidal ideation of limited frequency, intensity, duration, and specificity.  There are no identifiable plans, no associated intent, mild dysphoria and related symptoms, good self-control (both objective and subjective assessment), few other risk factors, and identifiable protective factors, including available and accessible social support.  PLAN OF CARE: Safety and Monitoring --  Admission to inpatient psychiatric unit for safety, stabilization and treatment -- Daily contact with patient to assess and evaluate symptoms and progress in treatment -- Patient's case to be discussed in multi-disciplinary team meeting. -- Patient will be encouraged to participate in the therapeutic group milieu. -- Observation Level : q15 minute checks -- Vital signs:  q12 hours -- Precautions: suicide.  Plan  -Monitor Vitals. -Monitor for thoughts of harm to self or others -Monitor for psychosis, disorganization or changes to cognition -Monitor for withdrawal symptoms. -Monitor for medication side effects.  Labs/Studies: Lipids,  A1c  Medications: Holding prozac pending improvement in mania Olanzapine for psychosis and mood stability Prazosin for nightmares Trazodone for sleep and vistaril for anxiety PRN  I certify that inpatient services furnished can reasonably be expected to improve the patient's condition.   , MD 06/23/2021, 5:19 PM

## 2021-06-23 NOTE — [facility] Group Notes (Signed)
Adult Psychoeducational Group Note  Date:  06/23/2021 Time:  10:44 AM  Group Topic/Focus:  Goals Group:   The focus of this group is to help patients establish daily goals to achieve during treatment and discuss how the patient can incorporate goal setting into their daily lives to aide in recovery.  Participation Level:  Active  Participation Quality:  Appropriate  Affect:  Appropriate  Cognitive:  Appropriate  Insight: Appropriate  Engagement in Group:  Limited  Modes of Intervention:  Discussion  Additional Comments:  Pt Goal; See Doctor  Donell Beers 06/23/2021, 10:44 AM

## 2021-06-23 NOTE — Group Note (Signed)
Occupational Therapy Group Note  Group Topic:Self-Esteem  Group Date: 06/23/2021 Start Time: 1400 End Time: 1435 Facilitators: Donne Hazel, OT/L   Group Description: Group encouraged increased engagement and participation through discussion and activity focused on self-esteem. Patients explored and discussed the differences between healthy and low self-esteem and how it affects our daily lives and occupations with a focus on relationships, work, school, self-care, and personal leisure interests. Group discussion then transitioned into identifying specific strategies to boost self-esteem and engaged in a collaborative and independent activity looking at positive ways to describe oneself A-Z.   Therapeutic Goal(s): Understand and recognize the differences between healthy and low self-esteem Identify healthy strategies to improve/build self-esteem  Participation Level: Active   Participation Quality: Independent   Behavior: Cooperative   Speech/Thought Process: Tangential    Affect/Mood: Full range   Insight: Limited   Judgement: Limited   Individualization: Aradia was active in their participation of group discussion/activity, however presents as tangential and disorganized in her responses. Pt unable to complete activity, instead writing down a rap that matched every letter of the alphabet instead of identifying activities and positive traits  about self.   Modes of Intervention: Activity, Discussion, and Education  Patient Response to Interventions:  Disengaged and Engaged   Plan: Continue to engage patient in OT groups 2 - 3x/week.  06/23/2021  Donne Hazel, OT/L
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
## 2021-06-23 NOTE — Plan of Care (Signed)
  Problem: Education: Goal: Ability to state activities that reduce stress will improve Outcome: Progressing   Problem: Coping: Goal: Ability to identify and develop effective coping behavior will improve Outcome: Progressing   Problem: Self-Concept: Goal: Ability to identify factors that promote anxiety will improve Outcome: Progressing   

## 2021-06-23 NOTE — Progress Notes (Signed)
Recreation Therapy Notes  INPATIENT RECREATION THERAPY ASSESSMENT  Patient Details Name: Stacy Poole MRN: [id number] DOB: [birth year] Today's Date: 06/23/2021       Information Obtained From: Patient  Able to Participate in Assessment/Interview: Yes  Patient Presentation: Hyperverbal, Anxious  Reason for Admission (Per Patient): Other (Comments) (Pt stated she was just released from [facility] and didn't get her perscription for vistaril and her PTSD kicked in so she needed her medicine.)  Patient Stressors: Other (Comment) ("regular life issues and needing my medication")  Coping Skills:   Isolation, Journal, TV, Sports, Music, Exercise, Meditate, Deep Breathing, Talk, Prayer, Art, Avoidance, Read, Hot Bath/Shower  Leisure Interests (2+):  Exercise - Walking, Social - Friends, Music - Listen, Art - Curator, Individual - TV  Frequency of Recreation/Participation: Other (Comment) (Walk- When she can; Everything else- Daily)  Awareness of Community Resources:  Yes  Community Resources:  Park, Coffee Shop, Public affairs consultant  Current Use: No  If no, Barriers?: Transportation  Expressed Interest in [employer] Information: No  [employer] 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, 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  Patient Main Form of Transportation: Other (Comment) Community education officer)  Patient Strengths:  Producer, television/film/video, Good heart, Creative  Patient Identified Areas of Improvement:  Being self critical- need to rest, be patient and be grateful for what she has  Patient Goal for Hospitalization:  "to improve whatever areas I can while I'm here"  Current SI (including self-harm):  No  Current HI:  No  Current AVH: No  Staff Intervention Plan: Group Attendance, Collaborate with Interdisciplinary Treatment Team  Consent to Intern Participation: N/A    [name] Rancher, LRT/CTRS [name] Rancher A 06/23/2021, 1:58 PM

## 2021-06-23 NOTE — Progress Notes (Signed)
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 I am trying to avoid having brain surgery because of my inflamed spinal cord injury and acid reflux!" Pt is verbally aggressive towards other patients and staff calling the nurse on the hall a "cult leader" and going around telling other patients that this is a concentration camp. Pt has been redirected multiple times and will continued to be redirected and monitored.

## 2021-06-24 ENCOUNTER — Encounter (HOSPITAL_COMMUNITY): Payer: Self-pay

## 2021-06-24 ENCOUNTER — Ambulatory Visit (HOSPITAL_COMMUNITY)
Admit: 2021-06-24 | Discharge: 2021-06-24 | Disposition: A | Discharge status: Home / Self Care | Payer: BC Managed Care – PPO | Attending: Student | Admitting: Student

## 2021-06-24 DIAGNOSIS — R413 Other amnesia: Secondary | ICD-10-CM | POA: Insufficient documentation

## 2021-06-24 NOTE — [facility] Counselor (Signed)
CSW attempted to complete this pt's PSA, however this pt was sent to [facility] for imaging.    Ruthann Cancer MSW, LCSW Clincal Social Worker  [facility]

## 2021-06-24 NOTE — Progress Notes (Signed)
Progress note  Pt found pacing the hallway singing loudly. Pt was compliant with scheduled and prn medications. Pt's energy has decreased this morning. Pt has been less delusional with interactions and able to control their emotions when angered. Pt was compliant with CT scan scheduled. Pt denies si/hi/ah/vh and verbally agrees to approach staff if these become apparent or before harming themselves/others while at bhh.  A: Pt provided support and encouragement. Pt given medication per protocol and standing orders. Q34m safety checks implemented and continued.  R: Pt safe on the unit. Will continue to monitor.

## 2021-06-24 NOTE — [facility] Group Notes (Signed)
Adult Psychoeducational Group Note  Date:  06/24/2021 Time:  4:27 PM  Group Topic/Focus:  Relaxation  Participation Level:  Did Not Attend    Donell Beers 06/24/2021, 4:27 PM

## 2021-06-24 NOTE — Progress Notes (Signed)
Patient has been calm this evening and compliant with medications requested prn trazodone at hs to help her sleep. Patient stated " I am sorry for the way I was behaving yesterday its because of my PTSD, I  feel better today" Support and encouragement provided as needed. No adverse drug noted.

## 2021-06-24 NOTE — BH IP Treatment Plan (Signed)
Interdisciplinary Treatment and Diagnostic Plan Update  06/24/2021 Time of Session:  Stacy Poole MRN: [id number]  Principal Diagnosis: <principal problem not specified>  Secondary Diagnoses: Active Problems:   Bipolar affective disorder, currently manic, severe, with psychotic features ([place])   Current Medications:  Current Facility-Administered Medications  Medication Dose Route Frequency Provider Last Rate Last Admin   acetaminophen (TYLENOL) tablet 650 mg  650 mg Oral Q4H PRN Starkes-Perry, Gayland Curry, FNP       albuterol (VENTOLIN HFA) 108 (90 Base) MCG/ACT inhaler 1 puff  1 puff Inhalation TID PRN Burt Ek, Gayland Curry, FNP       alum & mag hydroxide-simeth (MAALOX/MYLANTA) 200-200-20 MG/5ML suspension 30 mL  30 mL Oral Q4H PRN Suella Broad, FNP   30 mL at 06/23/21 1309   famotidine (PEPCID) tablet 20 mg  20 mg Oral Daily France Ravens, MD   20 mg at 56/38/75 6433   folic acid (FOLVITE) tablet 1 mg  1 mg Oral Daily Suella Broad, FNP   1 mg at 06/24/21 0745   hydrOXYzine (ATARAX/VISTARIL) tablet 25 mg  25 mg Oral TID PRN Suella Broad, FNP   25 mg at 06/24/21 1007   ibuprofen (ADVIL) tablet 600 mg  600 mg Oral Q6H PRN Lindon Romp A, NP   600 mg at 06/23/21 2051   OLANZapine zydis (ZYPREXA) disintegrating tablet 10 mg  10 mg Oral Q8H PRN France Ravens, MD   10 mg at 06/24/21 1007   And   LORazepam (ATIVAN) tablet 1 mg  1 mg Oral PRN France Ravens, MD       And   ziprasidone (GEODON) injection 20 mg  20 mg Intramuscular PRN France Ravens, MD       magnesium hydroxide (MILK OF MAGNESIA) suspension 30 mL  30 mL Oral Daily PRN Starkes-Perry, Gayland Curry, FNP       nicotine (NICODERM CQ - dosed in mg/24 hours) patch 21 mg  21 mg Transdermal Daily PRN Suella Broad, FNP   21 mg at 06/24/21 0745   OLANZapine (ZYPREXA) tablet 10 mg  10 mg Oral QHS Suella Broad, FNP   10 mg at 06/23/21 2050   OLANZapine (ZYPREXA) tablet 5 mg  5 mg Oral Daily Suella Broad, FNP   5 mg at 06/24/21 0745   prazosin (MINIPRESS) capsule 2 mg  2 mg Oral QHS Suella Broad, FNP   2 mg at 06/23/21 2051   traZODone (DESYREL) tablet 50 mg  50 mg Oral QHS PRN Suella Broad, FNP   50 mg at 06/23/21 2051   PTA Medications: Medications Prior to Admission  Medication Sig Dispense Refill Last Dose   albuterol (VENTOLIN HFA) 108 (90 Base) MCG/ACT inhaler Inhale 1 puff into the lungs 3 (three) times daily as needed for wheezing.      FLUoxetine (PROZAC) 10 MG capsule Take 10 mg by mouth daily.      folic acid (FOLVITE) 1 MG tablet Take 1 mg by mouth daily.      prazosin (MINIPRESS) 2 MG capsule Take 2 mg by mouth daily.      zolpidem (AMBIEN) 10 MG tablet Take 10 mg by mouth at bedtime.       Patient Stressors: Health problems   Medication change or noncompliance   Traumatic event    Patient Strengths: Ability for insight  Communication skills  Physical Health  Supportive family/friends   Treatment Modalities: Medication Management, Group therapy, Case management,  1  to 1 session with clinician, Psychoeducation, Recreational therapy.   Physician Treatment Plan for Primary Diagnosis: <principal problem not specified> Long Term Goal(s): Improvement in symptoms so as ready for discharge   Short Term Goals: Ability to identify changes in lifestyle to reduce recurrence of condition will improve Ability to verbalize feelings will improve Ability to demonstrate self-control will improve Ability to identify and develop effective coping behaviors will improve Ability to maintain clinical measurements within normal limits will improve Compliance with prescribed medications will improve Ability to identify triggers associated with substance abuse/mental health issues will improve  Medication Management: Evaluate patient's response, side effects, and tolerance of medication regimen.  Therapeutic Interventions: 1 to 1 sessions, Unit Group sessions and  Medication administration.  Evaluation of Outcomes: Not Met  Physician Treatment Plan for Secondary Diagnosis: Active Problems:   Bipolar affective disorder, currently manic, severe, with psychotic features ([place])  Long Term Goal(s): Improvement in symptoms so as ready for discharge   Short Term Goals: Ability to identify changes in lifestyle to reduce recurrence of condition will improve Ability to verbalize feelings will improve Ability to demonstrate self-control will improve Ability to identify and develop effective coping behaviors will improve Ability to maintain clinical measurements within normal limits will improve Compliance with prescribed medications will improve Ability to identify triggers associated with substance abuse/mental health issues will improve     Medication Management: Evaluate patient's response, side effects, and tolerance of medication regimen.  Therapeutic Interventions: 1 to 1 sessions, Unit Group sessions and Medication administration.  Evaluation of Outcomes: Not Met   RN Treatment Plan for Primary Diagnosis: <principal problem not specified> Long Term Goal(s): Knowledge of disease and therapeutic regimen to maintain health will improve  Short Term Goals: Ability to demonstrate self-control, Ability to participate in decision making will improve, and Ability to verbalize feelings will improve  Medication Management: RN will administer medications as ordered by provider, will assess and evaluate patient's response and provide education to patient for prescribed medication. RN will report any adverse and/or side effects to prescribing provider.  Therapeutic Interventions: 1 on 1 counseling sessions, Psychoeducation, Medication administration, Evaluate responses to treatment, Monitor vital signs and CBGs as ordered, Perform/monitor CIWA, COWS, AIMS and Fall Risk screenings as ordered, Perform wound care treatments as ordered.  Evaluation of Outcomes: Not  Met   LCSW Treatment Plan for Primary Diagnosis: <principal problem not specified> Long Term Goal(s): Safe transition to appropriate next level of care at discharge, Engage patient in therapeutic group addressing interpersonal concerns.  Short Term Goals: Engage patient in aftercare planning with referrals and resources, Increase ability to appropriately verbalize feelings, and Increase emotional regulation  Therapeutic Interventions: Assess for all discharge needs, 1 to 1 time with Social worker, Explore available resources and support systems, Assess for adequacy in community support network, Educate family and significant other(s) on suicide prevention, Complete Psychosocial Assessment, Interpersonal group therapy.  Evaluation of Outcomes: Not Met   Progress in Treatment: Attending groups: Yes. Participating in groups: Yes. Taking medication as prescribed: Yes. Toleration medication: Yes. Family/Significant other contact made: No, will contact:  mother Patient understands diagnosis: Yes. Discussing patient identified problems/goals with staff: Yes. Medical problems stabilized or resolved: Yes. Denies suicidal/homicidal ideation: Yes. Issues/concerns per patient self-inventory: No. Other: None  New problem(s) identified: No, Describe:  None  New Short Term/Long Term Goal(s):medication stabilization, elimination of SI thoughts, development of comprehensive mental wellness plan.   Patient Goals:  PT was at ED and was unable to participate.  Discharge  Plan or Barriers: Patient recently admitted. CSW will continue to follow and assess for appropriate referrals and possible discharge planning.   Reason for Continuation of Hospitalization: Mania Medication stabilization Suicidal ideation  Estimated Length of Stay: 3-5 days   Scribe for Treatment Team: Eliott Nine 06/24/2021 3:03 PM

## 2021-06-24 NOTE — [facility] Group Notes (Signed)
  Spirituality group facilitated by Kathleen Argue, B[facility].   Group Description: Group focused on topic of hope. Patients participated in facilitated discussion around topic, connecting with one another around experiences and definitions for hope. Group members engaged with visual explorer photos, reflecting on what hope looks like for them today. Group engaged in discussion around how their definitions of hope are present today in hospital.   Modalities: Psycho-social ed, Adlerian, Narrative, MI   Patient Progress:  Stacy Poole came to group initially, but when she learned it is a spirituality group, she stated that she has been traumatized for her spirituality and she could not stay.  She left group at that time.  Chaplain Dyanne Carrel, Bcc Pager, [phone] 4:15 PM

## 2021-06-24 NOTE — Group Note (Signed)
Recreation Therapy Group Note   Group Topic:Team Building  Group Date: 06/24/2021 Start Time: 1000 End Time: 1035 Facilitators: [name] Rancher, LRT/CTRS Location: [address] Dayroom  Goal Area(s) Addresses:  Patient will effectively work with peer towards shared goal.  Patient will identify skills used to make activity successful.  Patient will share challenges and verbalize solution-driven approaches used. Patient will identify how skills used during activity can be used to reach post d/c goals.   Group Description: [employer]. Patients were provided the following materials: 2 drinking straws, 5 rubber bands, 5 paper clips, 2 index cards and 2 drinking cups. Using the provided materials patients were asked to build a launching mechanism to launch a ping pong ball across the room, approximately 10 feet. Patients were divided into teams of 3-5. Instructions required all materials be incorporated into the device, functionality of items left to the peer group's discretion.   Affect/Mood: Manic   Participation Level: Hyperverbal   Participation Quality: Moderate Cues   Behavior: Distracted and Off-task   Speech/Thought Process: Delusional   Insight: Lacking   Judgement: Lacking    Modes of Intervention: STEM Activity   Patient Response to Interventions:  Disengaged   Education Outcome:  Acknowledges education and In group clarification offered    Clinical Observations/Individualized Feedback: Pt came into group saying she had a bad nose bleed but no evidence of it was seen.  Pt didn't complete activity.  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  At one point, pt started doing push ups on the floor.  Pt also expressed she writes things down so she can remember what happened at each hour.    Plan: Continue to engage patient in RT group sessions 2-3x/week.   [name] Rancher,  LRT/CTRS 06/24/2021 12:23 PM

## 2021-06-24 NOTE — Progress Notes (Signed)
Psychoeducational Group Note  Date:  06/24/2021 Time:  2036  Group Topic/Focus:  Wrap-Up Group:   The focus of this group is to help patients review their daily goal of treatment and discuss progress on daily workbooks.  Participation Level: Did Not Attend  Participation Quality:  Not Applicable  Affect:  Not Applicable  Cognitive:  Not Applicable  Insight:  Not Applicable  Engagement in Group: Not Applicable  Additional Comments:  The patient did not attend group this evening.   [name] S 06/24/2021, 8:36 PM

## 2021-06-24 NOTE — Group Note (Unsigned)
Group Topic: Goal Setting  Group Date: 06/24/2021 Start Time: 0900 End Time: 1000 Facilitators: Guss Bunde  [facility]  Number of Participants: 4  Group Focus: anxiety Treatment Modality:  Family Therapy Interventions utilized were group exercise Purpose: express feelings   Name: Stacy Poole Date of Birth: 01-Sep-1991  MR: [id number]    Level of Participation: {THERAPIES; PSYCH GROUP PARTICIPATION CVELF:81017} Quality of Participation: {THERAPIES; PSYCH QUALITY OF PARTICIPATION:23992} Interactions with others: {THERAPIES; PSYCH INTERACTIONS:23993} Mood/Affect: {THERAPIES; PSYCH MOOD/AFFECT:23994} Triggers (if applicable): *** Cognition: {THERAPIES; PSYCH COGNITION:23995} Progress: {THERAPIES; PSYCH PROGRESS:23997} Response: *** Plan: {THERAPIES; PSYCH PZWC:58527}  Patients Problems:  Patient Active Problem List   Diagnosis Date Noted   Bipolar affective disorder, currently manic, severe, with psychotic features ([facility]) 06/22/2021   Suicidal ideation    Schizoaffective disorder ([facility]) 10/14/2019   Schizophrenia ([facility]) 03/27/2019   Alcohol use disorder, severe, dependence ([facility]) 03/05/2019   Alcohol abuse with alcohol-induced psychotic disorder, 

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
## 2021-06-24 NOTE — BHH Group Notes (Signed)
Adult Psychoeducational Group Note  Date:  06/24/2021 Time:  8:49 AM  Group Topic/Focus:  Goals Group:   The focus of this group is to help patients establish daily goals to achieve during treatment and discuss how the patient can incorporate goal setting into their daily lives to aide in recovery.  Participation Level:  Active  Participation Quality:  Intrusive and Monopolizing  Affect:  Irritable  Cognitive:  Disorganized and Delusional  Insight: Lacking  Engagement in Group:  Monopolizing, Off Topic, and Poor  Modes of Intervention:  Limit-setting  Additional Comments:  Pt was very disruptive , talking loudly and making peers feel uncomfortable during group. Pt could not stay focused on topic and also used vulgar language throughout the group.   Donell Beers 06/24/2021, 8:49 AM

## 2021-06-24 NOTE — Plan of Care (Signed)
  Problem: Self-Concept: Goal: Ability to modify response to factors that promote anxiety will improve Outcome: Progressing   Problem: Education: Goal: Utilization of techniques to improve thought processes will improve Outcome: Progressing Goal: Knowledge of the prescribed therapeutic regimen will improve Outcome: Progressing   

## 2021-06-24 NOTE — Progress Notes (Signed)
Pt has been manic, intrusive and loud this morning yelling at the top of her lungs while walking up and down the hall saying "We are all in a concentration camp just like the [place] people in [place]! It is not safe!" Pt has been disturbing other patients that are still trying to sleep and other patients are irritable in the dayroom. Pt has been redirected to lower her voice or asked to go her room. Pt will continued to be monitored and redirected as needed on the unit.

## 2021-06-24 NOTE — Group Note (Signed)
LCSW Group Therapy Note  Group Date: 06/24/2021 Start Time: 1100 End Time: 1200   Type of Therapy and Topic:  Group Therapy: Positive Affirmations  Participation Level:  Active   Description of Group:   This group addressed positive affirmation towards self and others.  Patients went around the room and identified two positive things about themselves and two positive things about a peer in the room.  Patients reflected on how it felt to share something positive with others, to identify positive things about themselves, and to hear positive things from others/ Patients were encouraged to have a daily reflection of positive characteristics or circumstances.   Therapeutic Goals: Patients will verbalize two of their positive qualities Patients will demonstrate empathy for others by stating two positive qualities about a peer in the group Patients will verbalize their feelings when voicing positive self affirmations and when voicing positive affirmations of others Patients will discuss the potential positive impact on their wellness/recovery of focusing on positive traits of self and others.  Summary of Patient Progress:  Stacy Poole actively engaged in the discussion and provided feedback and barriers. Stacy Poole was unable to identify positive affirmations about herself as well as other group members. Patient demonstrated limited insight into the subject matter, was respectful of peers. Stacy Poole left group and came back later due to feeling "hangry."  Therapeutic Modalities:   Cognitive Behavioral Therapy Motivational Interviewing    Otelia Santee, LCSW 06/24/2021  11:50 AM

## 2021-06-25 DIAGNOSIS — F4310 Post-traumatic stress disorder, unspecified: Secondary | ICD-10-CM | POA: Diagnosis present

## 2021-06-25 MED ORDER — CLONIDINE HCL 0.1 MG PO TABS
0.1000 mg | ORAL_TABLET | Freq: Every day | ORAL | Status: DC
  Administered 2021-06-25: 0.1 mg via ORAL
  Filled 2021-06-25 (×3): qty 1

## 2021-06-25 NOTE — [facility] Group Notes (Signed)
[facility] Group Notes:  (Nursing/MHT/Case Management/Adjunct)  Date:  06/25/2021  Time:  3:27 PM  Type of Therapy:   Therapeutic Relaxation group  Participation Level:  Active  Participation Quality:  Attentive  Affect:  Appropriate  Cognitive:  Appropriate  Insight:  Improving  Engagement in Group:  Engaged and Improving  Modes of Intervention:  Activity and Exploration  Summary of Progress/Problems: Relaxation techniques were performed in group such as deep breathing and calming visualization techniques. Relaxation isn't only about peace of mind or enjoying a hobby. It's a process that decreases the stress effects on your mind and body.  Sadeen Wiegel J Sharelle Burditt 06/25/2021, 3:27 PM

## 2021-06-25 NOTE — B[facility] Group Notes (Signed)
.  Psychoeducational Group Note    Date:06/25/2021 Time: 1300-1400    Purpose of Group: . The group focus' on teaching patients on how to identify their needs and how Life Skills:  A group where two lists are made. What people need and what are things that we do that are healthy. The lists are developed by the patients and it is explained that we often do the actions that are not healthy to get our list of needs met.  to develop the coping skills needed to get their needs met  Participation Level:  did not attend   Paulino Rily

## 2021-06-25 NOTE — Progress Notes (Signed)
Pt didn't attend 1600 group 

## 2021-06-25 NOTE — Plan of Care (Signed)
  Problem: Self-Concept: Goal: Level of anxiety will decrease Outcome: Progressing   Problem: Activity: Goal: Interest or engagement in leisure activities will improve Outcome: Progressing   Problem: Coping: Goal: Coping ability will improve Outcome: Progressing   

## 2021-06-25 NOTE — Group Note (Signed)
LCSW Group Therapy Note  No therapy group could be held this day due to staffing issues.  Another licensed group was held.  Lauro Manlove Grossman-Orr, LCSW 06/25/2021 11:25 AM     

## 2021-06-25 NOTE — Progress Notes (Signed)
Pt has been very argumentation about her medications. Pt is fixated on Vistaril stating that is the reason why she is her in the hospital. Pt given vistaril at 2043 before going to bed, pt came around midnight stating that she was not given Vistaril and she was very anxious, pt given zyprexa  as ordered for anxiety since it too soon to give vistaril. Pt came back again at 0340 complaining of pain, Tylenol given, but still pt complained why she not been given her Vistaril, pt instructed that when it time to get her Vistaril, it will be given to her.

## 2021-06-25 NOTE — Progress Notes (Signed)
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 Denies  Danger to Others  Danger to Others None reported or observed

## 2021-06-25 NOTE — [facility] Group Notes (Signed)
[facility] Group Notes:  (Nursing/MHT/Case Management/Adjunct)  Date:  06/25/2021  Time:  1:15 PM  Type of Therapy:   Orientation/Goals group  Participation Level:  Active  Participation Quality:  Appropriate  Affect:  Appropriate  Cognitive:  Alert and Appropriate  Insight:  Appropriate, Good, and Improving  Engagement in Group:  Engaged and Improving  Modes of Intervention:  Discussion and Orientation  Summary of Progress/Problems: Pt goal for today is to keep calm and carry on throughout the day.   Eleuterio Dollar J Windi Toro 06/25/2021, 1:15 PM

## 2021-06-25 NOTE — Progress Notes (Signed)
Pt is awake and claims that the only reason why she is here at [facility]. is so that she can receive Vistaril since she can't get an outpatient appointment until the 24th. The nurse gave her medication, but not what she had requested.

## 2021-06-25 NOTE — Progress Notes (Signed)
Patient did not attend wrap up group. 
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
## 2021-06-26 MED ORDER — CLONIDINE HCL 0.1 MG PO TABS
0.1000 mg | ORAL_TABLET | Freq: Once | ORAL | Status: AC
  Administered 2021-06-26: 0.1 mg via ORAL
  Filled 2021-06-26 (×2): qty 1

## 2021-06-26 MED ORDER — CLONIDINE HCL 0.1 MG PO TABS
0.1000 mg | ORAL_TABLET | Freq: Two times a day (BID) | ORAL | Status: DC
  Administered 2021-06-26 – 2021-07-05 (×16): 0.1 mg via ORAL
  Filled 2021-06-26 (×24): qty 1

## 2021-06-26 MED ORDER — LUMATEPERONE TOSYLATE 42 MG PO CAPS
42.0000 mg | ORAL_CAPSULE | Freq: Every day | ORAL | Status: DC
  Administered 2021-06-26 – 2021-07-05 (×10): 42 mg via ORAL
  Filled 2021-06-26 (×12): qty 1

## 2021-06-26 NOTE — [facility] Group Notes (Signed)
[facility] Group Notes:  (Nursing/MHT/Case Management/Adjunct)  Date:  06/26/2021  Time:  9:41 AM  Type of Therapy:   Orientation/Goals group  Participation Level:  Active  Participation Quality:  Appropriate  Affect:  Appropriate  Cognitive:  Alert and Appropriate  Insight:  Appropriate  Engagement in Group:  Engaged  Modes of Intervention:  Discussion and Orientation  Summary of Progress/Problems: Pt goal for today is to stay focused and remain calm.   Rifky Lapre J Carlyle Achenbach 06/26/2021, 9:41 AM

## 2021-06-26 NOTE — [facility] Counselor (Signed)
Adult Psychoeducational Group Not Date:  06/26/2021 Time:  0900-1045 Group Topic/Focus: PROGRESSIVE RELAXATION. A group where deep breathing is taught and tensing and relaxation muscle groups is used. Imagery is used as well.  Pts are asked to imagine 3 pillars that hold them up when they are not able to hold themselves up.  Participation Level:  Came to the room sat down and made a statement about another pt. Who was also in the room  Participation Quality:  Pt starting yelling at the other pt and than was escorted out of the room   Affect:  angry  Cognitive:  limited  Insight: lacking  Engagement in Group:  not engaged  Modes of Intervention:  Activity, Discussion, Education, and Support    Stacy Poole
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
## 2021-06-26 NOTE — Progress Notes (Signed)
Adult Psychoeducational Group Note  Date:  06/26/2021 Time:  8:30 PM  Group Topic/Focus:  Wrap-Up Group:   The focus of this group is to help patients review their daily goal of treatment and discuss progress on daily workbooks.  Participation Level:  Active  Participation Quality:  Appropriate  Affect:  Appropriate  Cognitive:  Oriented  Insight: Limited  Engagement in Group:  Engaged  Modes of Intervention:  Exploration  Additional Comments:  patient attended and participated in group tonight. She reports that today she learnt that when you dont feel safe you should let the nurse know about. Today she did. Her goal for tomorrow is to eat better.  Lita Mains [facility] 06/26/2021, 8:30 PM

## 2021-06-26 NOTE — Progress Notes (Signed)
   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 Denies  Danger to Others  Danger to Others None reported or observed
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
## 2021-06-26 NOTE — Progress Notes (Signed)
Pt didn't attend therapeutic relaxation group. 

## 2021-06-26 NOTE — Group Note (Signed)
Group Topic: Crisis Management  Group Date: 06/26/2021 Start Time: 1600 End Time: 1630 Facilitators: Raylene Miyamoto, RN  Department: [facility] INPATIENT ADULT 500B  Number of Participants: 5  Group Focus: activities of daily living skills, communication, concentration, nursing group, and self-awareness Treatment Modality:  Interpersonal Therapy, Psychoeducation, and Solution-Focused Therapy Interventions utilized were exploration, patient education, and support Purpose: enhance coping skills, express feelings, improve communication skills, and increase insight  Name: Stacy Poole Date of Birth: 1991/05/26  MR: [id number]    Level of Participation: did not attend Quality of Participation: did not attend Interactions with others: did not attend  Patients Problems:  Patient Active Problem List   Diagnosis Date Noted   PTSD (post-traumatic stress disorder) 06/25/2021   Bipolar affective disorder, currently manic, severe, with psychotic features (HCC) 06/22/2021   Suicidal ideation    Schizoaffective disorder (HCC) 10/14/2019   Schizophrenia (HCC) 03/27/2019   Alcohol use disorder, severe, dependence (HCC) 03/05/2019   Alcohol abuse with alcohol-induced psychotic disorder, 

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
## 2021-06-26 NOTE — Plan of Care (Signed)
  Problem: Activity: Goal: Imbalance in normal sleep/wake cycle will improve Outcome: Progressing   Problem: Coping: Goal: Will verbalize feelings Outcome: Progressing   
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
## 2021-06-26 NOTE — Progress Notes (Signed)
Progress note  Pt found in bed; compliant with medication administration. Pt continues to have chronic pain in their neck/back. Pt is hyperactive compared to yesterday and had to be asked to leave the dayroom multiple times for reading the bible loudly and arguing with other pt's over paranoid thought. Pt did show insight and asked for prn medications for this. Pt continues to have fixed delusions with loose association and tangential speech when discussing most things, but is more pleasant and showing impulse control. Pt denies si/hi/ah/vh and verbally agrees to approach staff if these become apparent or before harming themselves/others while at [facility].  A: Pt provided support and encouragement. Pt given medication per protocol and standing orders. Q60m safety checks implemented and continued.  R: Pt safe on the unit. Will continue to monitor.

## 2021-06-27 LAB — LAMOTRIGINE LEVEL: Lamotrigine Lvl: <1 ug/mL — ABNORMAL LOW (ref 2.0–20.0)

## 2021-06-27 MED ORDER — OLANZAPINE 7.5 MG PO TABS
15.0000 mg | ORAL_TABLET | Freq: Every day | ORAL | Status: DC
  Administered 2021-06-27 – 2021-06-28 (×2): 15 mg via ORAL
  Filled 2021-06-27 (×3): qty 2

## 2021-06-27 MED ORDER — BENZTROPINE MESYLATE 0.5 MG PO TABS
0.5000 mg | ORAL_TABLET | Freq: Two times a day (BID) | ORAL | Status: DC | PRN
  Administered 2021-06-27: 0.5 mg via ORAL
  Filled 2021-06-27: qty 1

## 2021-06-27 NOTE — Group Note (Signed)
Recreation Therapy Group Note   Group Topic:Stress Management  Group Date: 06/27/2021 Start Time: 0935 End Time: 1050 Facilitators: [name] Rancher, LRT/CTRS Location: 300 Hall Dayroom  Goal Area(s) Addresses:  Patient will identify positive stress management techniques. Patient will identify benefits of using stress management post d/c.  Group Description:  Meditation.  LRT played a meditation that focused on having patience when dealing with situations beyond our control.  Patients were to sit and focus as the meditation played to fully engage.   Affect/Mood: N/A   Participation Level: Did not attend    Clinical Observations/Individualized Feedback: Pt did not attend group.    Plan: Continue to engage patient in RT group sessions 2-3x/week.   [name] Rancher, LRT/CTRS 06/27/2021 1:32 PM

## 2021-06-27 NOTE — [facility] Group Notes (Signed)
Spiritual care group on grief and loss facilitated by chaplain Dyanne Carrel, [facility]   Group Goal:   Support / Education around grief and loss   Members engage in facilitated group support and psycho-social education.   Group Description:   Following introductions and group rules, group members engaged in facilitated group dialog and support around topic of loss, with particular support around experiences of loss in their lives. Group Identified types of loss (relationships / self / things) and identified patterns, circumstances, and changes that precipitate losses. Reflected on thoughts / feelings around loss, normalized grief responses, and recognized variety in grief experience. Group noted Worden's four tasks of grief in discussion.   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  When she heard the topic of the group, she stated that she could not do that today and promptly left.  [id number] Dyanne Carrel, B[facility] Pager [phone] 3:29 PM

## 2021-06-27 NOTE — [facility] Group Notes (Signed)
Patient was given material for group today 
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
## 2021-06-27 NOTE — Group Note (Signed)
LCSW Group Therapy Note  Group Date: 06/27/2021 Start Time: 1300 End Time: 1400   Type of Therapy and Topic:  Group Therapy: Anger Cues and Responses  Participation Level:  Active   Description of Group:   In this group, patients learned how to recognize the physical, cognitive, emotional, and behavioral responses they have to anger-provoking situations.  They identified a recent time they became angry and how they reacted.  They analyzed how their reaction was possibly beneficial and how it was possibly unhelpful.  The group discussed a variety of healthier coping skills that could help with such a situation in the future.  Focus was placed on how helpful it is to recognize the underlying emotions to our anger, because working on those can lead to a more permanent solution as well as our ability to focus on the important rather than the urgent.  Therapeutic Goals: Patients will remember their last incident of anger and how they felt emotionally and physically, what their thoughts were at the time, and how they behaved. Patients will identify how their behavior at that time worked for them, as well as how it worked against them. Patients will explore possible new behaviors to use in future anger situations. Patients will learn that anger itself is normal and cannot be eliminated, and that healthier reactions can assist with resolving conflict rather than worsening situations.  Summary of Patient Progress:  Stacy Poole was active during the group. Patient was disruptive at times and unable to be redirected. Patient was unable to stay on topic throughout the group.  Patient encouraged to let others to participate but told CSW that she was unable to do so due to medical diagnosis.  Therapeutic Modalities:   Cognitive Behavioral Therapy    Beatris Si, LCSW 06/27/2021  1:59 PM

## 2021-06-27 NOTE — Progress Notes (Signed)
DAR NOTE: Patient presents with anxious affect and restless mood.  Patient is loud, intrusive and disruptive in milieu.  Needed a lot of redirection on the unit.  Requested and received multiple prn medi cations.  Denies suicidal thoughts, auditory and visual hallucinations.  Rates depression at 0, hopelessness at 0, and anxiety at 1.  Maintained on routine safety checks.  Medications given as prescribed.  Support and encouragement offered as needed.  Attended group and participated but was disruptive.  States goal for today is "concentrate on breathing techniques."  Patient observed socializing with peers in the dayroom and in the hallway.  Patient is safe on and off the unit.

## 2021-06-27 NOTE — Progress Notes (Signed)
Psychoeducational Group Note  Date:  06/27/2021 Time:  2207  Group Topic/Focus:  Wrap-Up Group:   The focus of this group is to help patients review their daily goal of treatment and discuss progress on daily workbooks.  Participation Level: Did Not Attend  Participation Quality:  Not Applicable  Affect:  Not Applicable  Cognitive:  Not Applicable  Insight:  Not Applicable  Engagement in Group: Not Applicable  Additional Comments:  The  patient did not attend group this evening. The patient had been loud and argumentative prior to group.   Shellia Hartl S 06/27/2021, 10:07 PM

## 2021-06-28 DIAGNOSIS — F312 Bipolar disorder, current episode manic severe with psychotic features: Principal | ICD-10-CM

## 2021-06-28 MED ORDER — CARBAMAZEPINE ER 100 MG PO TB12
100.0000 mg | ORAL_TABLET | Freq: Two times a day (BID) | ORAL | Status: DC
  Administered 2021-06-28 – 2021-06-29 (×2): 100 mg via ORAL
  Filled 2021-06-28 (×4): qty 1

## 2021-06-28 MED ORDER — LORATADINE 10 MG PO TABS
10.0000 mg | ORAL_TABLET | Freq: Every day | ORAL | Status: DC | PRN
  Administered 2021-06-29: 10 mg via ORAL
  Filled 2021-06-28: qty 1

## 2021-06-28 NOTE — Progress Notes (Signed)
Pt didn't attend psycho-ed group.  

## 2021-06-28 NOTE — Progress Notes (Signed)
Pt states that she is anxious. "This is what happens when the Ativan wears off. I feel like my heart is hurting." Pt given PRNs per MAR.

## 2021-06-28 NOTE — Progress Notes (Signed)
Adult Psychoeducational Group Note  Date:  06/28/2021 Time:  8:10 PM  Group Topic/Focus:  Wrap-Up Group:   The focus of this group is to help patients review their daily goal of treatment and discuss progress on daily workbooks.  Participation Level:  Did Not Attend  Participation Quality:   Did Not Attend  Affect:   Did Not Attend  Cognitive:   Did Not Attend  Insight: None  Engagement in Group:   Did Not Attend  Modes of Intervention:   Did not attend  Additional Comments:  Pt did not attend evening wrap up group tonight.  Felipa Furnace 06/28/2021, 8:10 PM
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
## 2021-06-28 NOTE — Group Note (Signed)
Group Topic: Goal Setting  Group Date: 06/28/2021 Start Time: 0830 End Time: 0900 Facilitators: Margaret Pyle, NT  Department: [facility] INPATIENT ADULT [facility]  Number of Participants: 11  Group Focus: goals/reality orientation  Name: Stacy Poole Date of Birth: 1991-04-25  MR: [id number]    Level of Participation: Pt did attend goals group.

## 2021-06-28 NOTE — Progress Notes (Signed)
   06/28/21 2100  Psych Admission Type (Psych Patients Only)  Admission Status Involuntary  Psychosocial Assessment  Patient Complaints None  Eye Contact Fair  Facial Expression Anxious  Affect Anxious;Appropriate to circumstance  Speech Logical/coherent  Interaction Assertive  Motor Activity Slow  Appearance/Hygiene Improved  Behavior Characteristics Cooperative;Appropriate to situation;Anxious  Mood Anxious  Thought Process  Coherency Circumstantial  Content Delusions;Obsessions  Delusions Somatic  Perception WDL  Hallucination None reported or observed  Judgment Poor  Confusion None  Danger to Self  Current suicidal ideation? Denies  Danger to Others  Danger to Others None reported or observed   Pt was asleep at beginning of shift. Had been given PRNs d/t agitation on previous shift. When pt awoke, denied SI, HI, AVH and anxiety and depression. Rated pain 5/10 in her back. "I have spinal issues and that's what's causing me pain." Pt VS assessed and given PRNs and scheduled meds. Pt still needy but less intrusive. Still with fixed delusions.

## 2021-06-28 NOTE — Progress Notes (Signed)
Pt received PRN Vistaril, Zyprexa and Ativan (see emar) as scheduled due to continued agitation, verbal outburst /abuse towards peers and staff. Verbal redirection was ineffective on 4 separate attempts as pt progressed to slam her door, argumentative with peers, demanding to go off unit for lunch despite her disruptive behavior. Pt denies SI, HI, AVH and pain when assessed. Mood remains labile and pt is unwilling to follow unit routines. Compliant with medications when offered. Pt asleep when reassessed at approximately 1400. Tolerates all meals well.  Q 15 minutes safety checks maintained. Support, encouragement and reassurance provided to pt this shift.  Pt remains safe on unit.

## 2021-06-28 NOTE — Progress Notes (Signed)
Pt given PRN Trazodone with HS medication. Pt not given Clonidine due to pt Bp 95/62 . Pt continues to need frequent re-direction.      06/28/21 0100  Psych Admission Type (Psych Patients Only)  Admission Status Involuntary  Psychosocial Assessment  Patient Complaints Anxiety;Hyperactivity  Eye Contact Fair  Facial Expression Anxious  Affect Anxious;Appropriate to circumstance  Speech Logical/coherent  Interaction Arrogant;Assertive  Motor Activity Slow  Appearance/Hygiene Improved  Behavior Characteristics Cooperative  Mood Preoccupied;Pleasant  Thought Process  Coherency Disorganized;Flight of ideas  Content Delusions;Obsessions  Delusions Somatic  Perception WDL  Hallucination None reported or observed  Judgment Poor  Confusion None  Danger to Self  Current suicidal ideation? Denies  Danger to Others  Danger to Others None reported or observed

## 2021-06-29 ENCOUNTER — Encounter (HOSPITAL_COMMUNITY): Payer: Self-pay

## 2021-06-29 MED ORDER — TRAZODONE HCL 100 MG PO TABS
100.0000 mg | ORAL_TABLET | Freq: Every evening | ORAL | Status: DC | PRN
  Administered 2021-06-29 – 2021-07-04 (×4): 100 mg via ORAL
  Filled 2021-06-29 (×6): qty 1

## 2021-06-29 MED ORDER — LORAZEPAM 1 MG PO TABS: 1.0000 mg | ORAL_TABLET | ORAL | Status: DC | PRN

## 2021-06-29 MED ORDER — CARBAMAZEPINE ER 200 MG PO TB12
200.0000 mg | ORAL_TABLET | Freq: Two times a day (BID) | ORAL | Status: DC
  Administered 2021-06-29 – 2021-07-05 (×12): 200 mg via ORAL
  Filled 2021-06-29 (×16): qty 1

## 2021-06-29 MED ORDER — ZIPRASIDONE MESYLATE 20 MG IM SOLR: 20.0000 mg | Freq: Two times a day (BID) | INTRAMUSCULAR | Status: DC | PRN

## 2021-06-29 MED ORDER — LORAZEPAM 1 MG PO TABS
1.0000 mg | ORAL_TABLET | Freq: Three times a day (TID) | ORAL | Status: DC
  Administered 2021-06-29 – 2021-06-30 (×5): 1 mg via ORAL
  Filled 2021-06-29 (×5): qty 1

## 2021-06-29 MED ORDER — LORATADINE 10 MG PO TABS
10.0000 mg | ORAL_TABLET | Freq: Every day | ORAL | Status: DC
  Administered 2021-06-30 – 2021-07-05 (×6): 10 mg via ORAL
  Filled 2021-06-29 (×9): qty 1

## 2021-06-29 MED ORDER — OLANZAPINE 5 MG PO TBDP
5.0000 mg | ORAL_TABLET | Freq: Three times a day (TID) | ORAL | Status: DC | PRN
  Administered 2021-07-01 – 2021-07-03 (×4): 5 mg via ORAL
  Filled 2021-06-29 (×4): qty 1

## 2021-06-29 MED ORDER — OLANZAPINE 10 MG PO TABS
20.0000 mg | ORAL_TABLET | Freq: Every day | ORAL | Status: DC
  Administered 2021-06-29 – 2021-07-04 (×6): 20 mg via ORAL
  Filled 2021-06-29 (×8): qty 2

## 2021-06-29 MED ORDER — LORAZEPAM 1 MG PO TABS: 1.0000 mg | ORAL_TABLET | Freq: Four times a day (QID) | ORAL | Status: DC | PRN

## 2021-06-29 NOTE — [facility] Group Notes (Signed)
Adult Psychoeducational Group Note  Date:  06/29/2021 Time:  8:30 PM  Group Topic/Focus:  Making Healthy Choices:   The focus of this group is to help patients identify negative/unhealthy choices they were using prior to admission and identify positive/healthier coping strategies to replace them upon discharge.  Participation Level:  Active  Participation Quality:  Appropriate  Affect:  Appropriate  Cognitive:  Alert  Insight: Improving  Engagement in Group:  Engaged  Modes of Intervention:  Discussion  Additional Comments  Jacalyn Lefevre 06/29/2021, 8:30 PM

## 2021-06-29 NOTE — Progress Notes (Signed)
Pt is agitated, irritable and impatient with staff. Pt has been verbally aggressive and demanding. Staff just switched shifts and pt has been yelling down the hall about the dayroom not being open. Pt will continued to be monitored and redirected while on the unit.

## 2021-06-29 NOTE — Progress Notes (Signed)
Pt didn't attend therapeutic relaxation group. 

## 2021-06-29 NOTE — Group Note (Signed)
Recreation Therapy Group Note   Group Topic:Problem Solving  Group Date: 06/29/2021 Start Time: 1000 End Time: 1045 Facilitators: [name] Rancher, LRT/CTRS Location: [address] Dayroom   Goal Area(s) Addresses:  Patient will effectively work with peer towards shared goal.  Patient will identify skills used to make activity successful.  Patient will identify how skills used during activity can be used to reach post d/c goals.    Group Description: Each patient was given a poly spot plus one extra for the whole group and LRT used cones to create an obstacle course for patients to maneuver through.  Patients were to use the poly spots to get the group from one end of the hall to the other.  If anyone were to step off of their spot, the group had to start over. LRT and patients debriefed on the importance of patience, communication, paying attention, asking peers for help, and problem solving   Affect/Mood: Flat   Participation Level: Minimal   Participation Quality: Independent   Behavior: Reluctant   Speech/Thought Process: Irrational   Insight: Moderate   Judgement: Moderate   Modes of Intervention: Group work   Patient Response to Interventions:  Resistant    Education Outcome:  Acknowledges education and In group clarification offered    Clinical Observations/Individualized Feedback: Pt couldn't decide whether or not she wanted to participate in the activity.  Pt initially had an issue with some of the colors of the poly spots saying the reminded her of the [employer].  When the activity started, pt was still having issues with how the activity was to be done.  Pt came in for debriefing and shared it was hard to get her support system to accept her diagnosis even though she has.  Pt also expressed it's important to have people around you who accept you need medicine and not try to get you off of it.    Plan: Continue to engage patient in RT group sessions 2-3x/week.   [name] Rancher, LRT/CTRS 06/29/2021 12:46 PM

## 2021-06-29 NOTE — BH IP Treatment Plan (Signed)
Interdisciplinary Treatment and Diagnostic Plan Update  06/29/2021 Time of Session: did not attend, tx team update Stacy Poole MRN: [id number]  Principal Diagnosis: Bipolar affective disorder, currently manic, severe, with psychotic features (HCC)  Secondary Diagnoses: Principal Problem:   Bipolar affective disorder, currently manic, severe, with psychotic features (HCC) Active Problems:   Tobacco use disorder   PTSD (post-traumatic stress disorder)   Current Medications:  Current Facility-Administered Medications  Medication Dose Route Frequency Provider Last Rate Last Admin   acetaminophen (TYLENOL) tablet 650 mg  650 mg Oral Q4H PRN Maryagnes Amos, FNP   650 mg at 06/29/21 1318   albuterol (VENTOLIN HFA) 108 (90 Base) MCG/ACT inhaler 1 puff  1 puff Inhalation TID PRN Maryagnes Amos, FNP   1 puff at 06/29/21 1316   alum & mag hydroxide-simeth (MAALOX/MYLANTA) 200-200-20 MG/5ML suspension 30 mL  30 mL Oral Q4H PRN Maryagnes Amos, FNP   30 mL at 06/23/21 1309   benztropine (COGENTIN) tablet 0.5 mg  0.5 mg Oral BID PRN Park Pope, MD   0.5 mg at 06/27/21 1629   carbamazepine (TEGRETOL XR) 12 hr tablet 200 mg  200 mg Oral BID Mason Jim, Amy E, MD       cloNIDine (CATAPRES) tablet 0.1 mg  0.1 mg Oral BID Park Pope, MD   0.1 mg at 06/29/21 0816   famotidine (PEPCID) tablet 20 mg  20 mg Oral Daily Park Pope, MD   20 mg at 06/29/21 4259   folic acid (FOLVITE) tablet 1 mg  1 mg Oral Daily Maryagnes Amos, FNP   1 mg at 06/29/21 0816   hydrOXYzine (ATARAX/VISTARIL) tablet 25 mg  25 mg Oral TID PRN Maryagnes Amos, FNP   25 mg at 06/29/21 1048   ibuprofen (ADVIL) tablet 600 mg  600 mg Oral Q6H PRN Nira Conn A, NP   600 mg at 06/28/21 2102   loratadine (CLARITIN) tablet 10 mg  10 mg Oral Daily PRN Park Pope, MD   10 mg at 06/29/21 1048   LORazepam (ATIVAN) tablet 1 mg  1 mg Oral TID Comer Locket, MD   1 mg at 06/29/21 1255   LORazepam (ATIVAN)  tablet 1 mg  1 mg Oral Q6H PRN Comer Locket, MD       lumateperone tosylate (CAPLYTA) capsule 42 mg  42 mg Oral Daily Park Pope, MD   42 mg at 06/29/21 0816   magnesium hydroxide (MILK OF MAGNESIA) suspension 30 mL  30 mL Oral Daily PRN Maryagnes Amos, FNP       nicotine (NICODERM CQ - dosed in mg/24 hours) patch 21 mg  21 mg Transdermal Daily PRN Maryagnes Amos, FNP   21 mg at 06/29/21 0819   OLANZapine (ZYPREXA) tablet 20 mg  20 mg Oral QHS Singleton, Amy E, MD       OLANZapine (ZYPREXA) tablet 5 mg  5 mg Oral Daily Maryagnes Amos, FNP   5 mg at 06/29/21 0817   OLANZapine zydis (ZYPREXA) disintegrating tablet 5 mg  5 mg Oral Q8H PRN Comer Locket, MD       traZODone (DESYREL) tablet 50 mg  50 mg Oral QHS PRN Maryagnes Amos, FNP   50 mg at 06/28/21 2314   ziprasidone (GEODON) injection 20 mg  20 mg Intramuscular Q12H PRN Park Pope, MD       PTA Medications: Medications Prior to Admission  Medication Sig Dispense Refill Last Dose   albuterol (  VENTOLIN HFA) 108 (90 Base) MCG/ACT inhaler Inhale 1 puff into the lungs 3 (three) times daily as needed for wheezing.      FLUoxetine (PROZAC) 10 MG capsule Take 10 mg by mouth daily.      folic acid (FOLVITE) 1 MG tablet Take 1 mg by mouth daily.      prazosin (MINIPRESS) 2 MG capsule Take 2 mg by mouth daily.      zolpidem (AMBIEN) 10 MG tablet Take 10 mg by mouth at bedtime.       Patient Stressors: Health problems   Medication change or noncompliance   Traumatic event    Patient Strengths: Ability for insight  Communication skills  Physical Health  Supportive family/friends   Treatment Modalities: Medication Management, Group therapy, Case management,  1 to 1 session with clinician, Psychoeducation, Recreational therapy.   Physician Treatment Plan for Primary Diagnosis: Bipolar affective disorder, currently manic, severe, with psychotic features (HCC) Long Term Goal(s): Improvement in symptoms so as  ready for discharge   Short Term Goals: Ability to identify changes in lifestyle to reduce recurrence of condition will improve Ability to verbalize feelings will improve Ability to demonstrate self-control will improve Ability to identify and develop effective coping behaviors will improve Ability to maintain clinical measurements within normal limits will improve Compliance with prescribed medications will improve Ability to identify triggers associated with substance abuse/mental health issues will improve  Medication Management: Evaluate patient's response, side effects, and tolerance of medication regimen.  Therapeutic Interventions: 1 to 1 sessions, Unit Group sessions and Medication administration.  Evaluation of Outcomes: Progressing  Physician Treatment Plan for Secondary Diagnosis: Principal Problem:   Bipolar affective disorder, currently manic, severe, with psychotic features (HCC) Active Problems:   Tobacco use disorder   PTSD (post-traumatic stress disorder)  Long Term Goal(s): Improvement in symptoms so as ready for discharge   Short Term Goals: Ability to identify changes in lifestyle to reduce recurrence of condition will improve Ability to verbalize feelings will improve Ability to demonstrate self-control will improve Ability to identify and develop effective coping behaviors will improve Ability to maintain clinical measurements within normal limits will improve Compliance with prescribed medications will improve Ability to identify triggers associated with substance abuse/mental health issues will improve     Medication Management: Evaluate patient's response, side effects, and tolerance of medication regimen.  Therapeutic Interventions: 1 to 1 sessions, Unit Group sessions and Medication administration.  Evaluation of Outcomes: Progressing   RN Treatment Plan for Primary Diagnosis: Bipolar affective disorder, currently manic, severe, with psychotic  features (HCC) Long Term Goal(s): Knowledge of disease and therapeutic regimen to maintain health will improve  Short Term Goals: Ability to verbalize frustration and anger appropriately will improve, Ability to demonstrate self-control, Ability to participate in decision making will improve, Ability to verbalize feelings will improve, Ability to disclose and discuss suicidal ideas, Ability to identify and develop effective coping behaviors will improve, and Compliance with prescribed medications will improve  Medication Management: RN will administer medications as ordered by provider, will assess and evaluate patient's response and provide education to patient for prescribed medication. RN will report any adverse and/or side effects to prescribing provider.  Therapeutic Interventions: 1 on 1 counseling sessions, Psychoeducation, Medication administration, Evaluate responses to treatment, Monitor vital signs and CBGs as ordered, Perform/monitor CIWA, COWS, AIMS and Fall Risk screenings as ordered, Perform wound care treatments as ordered.  Evaluation of Outcomes: Progressing   LCSW Treatment Plan for Primary Diagnosis: Bipolar affective disorder, currently  manic, severe, with psychotic features (HCC) Long Term Goal(s): Safe transition to appropriate next level of care at discharge, Engage patient in therapeutic group addressing interpersonal concerns.  Short Term Goals: Engage patient in aftercare planning with referrals and resources, Increase social support, Increase ability to appropriately verbalize feelings, Increase emotional regulation, Facilitate acceptance of mental health diagnosis and concerns, Facilitate patient progression through stages of change regarding substance use diagnoses and concerns, Identify triggers associated with mental health/substance abuse issues, and Increase skills for wellness and recovery  Therapeutic Interventions: Assess for all discharge needs, 1 to 1 time with  Social worker, Explore available resources and support systems, Assess for adequacy in community support network, Educate family and significant other(s) on suicide prevention, Complete Psychosocial Assessment, Interpersonal group therapy.  Evaluation of Outcomes: Progressing   Progress in Treatment: Attending groups: Yes. Participating in groups: Yes. Taking medication as prescribed: Yes. Toleration medication: Yes. Family/Significant other contact made: Yes, individual(s) contacted:  Parents Patient understands diagnosis: No. Discussing patient identified problems/goals with staff: Yes. Medical problems stabilized or resolved: Yes. Denies suicidal/homicidal ideation: Yes. Issues/concerns per patient self-inventory: No.   New problem(s) identified: No, Describe:  none reported  New Short Term/Long Term Goal(s): medication stabilization, elimination of SI thoughts, development of comprehensive mental wellness plan.    Patient Goals:  Patient was in the ER and unable to participate.  Discharge Plan or Barriers: mental health, medication stabilization.  Reason for Continuation of Hospitalization: Mania Medication stabilization  Estimated Length of Stay: 3-5 days   Scribe for Treatment Team: Beatris Si, LCSW 06/29/2021 3:43 PM

## 2021-06-29 NOTE — [facility] Group Notes (Signed)
Adult Psychoeducational Group Note  Date:  06/29/2021 Time:  5:09 PM  Group Topic/Focus:  Wellness Toolbox:   The focus of this group is to discuss various aspects of wellness, balancing those aspects and exploring ways to increase the ability to experience wellness.  Patients will create a wellness toolbox for use upon discharge.  Participation Level:  Active  Participation Quality:  Appropriate  Affect:  Anxious, Defensive, and Irritable  Cognitive:  Disorganized and Delusional  Insight: Lacking  Engagement in Group:  Lacking  Modes of Intervention:  Clarification and Reality Testing  Additional Comments:    Donell Beers 06/29/2021, 5:09 PM

## 2021-06-29 NOTE — [facility] Group Notes (Signed)
Adult Psychoeducational Group Note  Date:  06/29/2021 Time:  9:29 AM  Group Topic/Focus:  Goals Group:   The focus of this group is to help patients establish daily goals to achieve during treatment and discuss how the patient can incorporate goal setting into their daily lives to aide in recovery.  Participation Level:  Active  Participation Quality:  Intrusive  Affect:  Irritable  Cognitive:  Disorganized and Delusional  Insight: None  Engagement in Group:  Distracting  Modes of Intervention:  Limit-setting, Reality Testing, and Pt was rude and interrupted other patients when they were talking  and had to be redirected   Donell Beers 06/29/2021, 9:29 AM
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
## 2021-06-29 NOTE — Group Note (Signed)
LCSW Group Therapy Note  Group Date: 06/29/2021 Start Time: 1300 End Time: 1400   Type of Therapy and Topic:  Group Therapy: Positive Affirmations  Participation Level:  Active   Description of Group:   This group addressed positive affirmation towards self and others.  Patients went around the room and identified two positive things about themselves and two positive things about a peer in the room.  Patients reflected on how it felt to share something positive with others, to identify positive things about themselves, and to hear positive things from others/ Patients were encouraged to have a daily reflection of positive characteristics or circumstances.   Therapeutic Goals: Patients will verbalize two of their positive qualities Patients will demonstrate empathy for others by stating two positive qualities about a peer in the group Patients will verbalize their feelings when voicing positive self affirmations and when voicing positive affirmations of others Patients will discuss the potential positive impact on their wellness/recovery of focusing on positive traits of self and others.  Summary of Patient Progress:  Stacy Poole actively engaged in the discussion and shared that talking is a strength of hers. She was able to identify positive affirmations about herself as well as other group members. Patient demonstrated developing insight into the subject matter, was respectful of peers, participated throughout the entire session.  Therapeutic Modalities:   Cognitive Behavioral Therapy Motivational Interviewing    Otelia Santee, LCSW 06/29/2021  2:30 PM

## 2021-06-30 NOTE — Plan of Care (Signed)
Spoke with Kadince Boxley (mother) [phone].  Mother states that patient has been a few days ago on many medications and still appeared manic after calling mother multiple times.  Patient appears to have improved since then from mother's perspective including the fact that patient has reduced the number of time she has called mother to once or twice a day rather than 14 times.  Patient's mother had some questions regarding her medication regiment as Caplyta and clonidine were new medications that did not appear to initially help patient.  Mother states that patient had reported to her that she was unhappy being on Ativan.  Discussed with mother as well as to patient that this was temporary in order to reduce manic symptoms.  Discussed that we were not likely to continue Ativan after discharge.  Mother appreciated help and verbalized understanding regarding patient's status and hospitalization.  -Park Pope, MD PGY1 Psychiatry Resident

## 2021-06-30 NOTE — [facility] Group Notes (Signed)
Adult Psychoeducational Group Note  Date:  06/30/2021 Time:  4:17 PM  Group Topic/Focus:  Wellness Toolbox:   The focus of this group is to discuss various aspects of wellness, balancing those aspects and exploring ways to increase the ability to experience wellness.  Patients will create a wellness toolbox for use upon discharge.  Participation Level:  Did Not Attend      Donell Beers 06/30/2021, 4:17 PM

## 2021-06-30 NOTE — Plan of Care (Signed)
  Problem: Health Behavior/Discharge Planning: Goal: Compliance with therapeutic regimen will improve Outcome: Progressing   Problem: Role Relationship: Goal: Will demonstrate positive changes in social behaviors and relationships Outcome: Progressing   Problem: Safety: Goal: Ability to disclose and discuss suicidal ideas will improve Outcome: Progressing   

## 2021-06-30 NOTE — Progress Notes (Signed)
Progress note    06/30/21 0746  Psych Admission Type (Psych Patients Only)  Admission Status Involuntary  Psychosocial Assessment  Patient Complaints Anxiety;Irritability  Eye Contact Fair  Facial Expression Anxious;Pensive  Affect Anxious;Irritable  Land;Attention-seeking  Motor Activity Slow  Appearance/Hygiene Bizarre  Behavior Characteristics Cooperative;Anxious;Impulsive  Mood Anxious;Preoccupied;Pleasant  Thought Process  Coherency Circumstantial  Content Confabulation;Delusions;Obsessions  Delusions Grandeur;Persecutory  Perception Derealization  Hallucination None reported or observed  Judgment Poor  Confusion None  Danger to Self  Current suicidal ideation? Denies  Danger to Others  Danger to Others None reported or observed

## 2021-06-30 NOTE — Progress Notes (Signed)
Patient interacted well with Peers and Staff this shift. Requested for trazodone for sleep Prn Trazodone 100 mg PO given at 2052 and effective. Q 15 minutes safety checks ongoing. Patient remains safe.
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
## 2021-06-30 NOTE — Group Note (Signed)
Recreation Therapy Group Note   Group Topic:Personal Development  Group Date: 06/30/2021 Start Time: 3382 End Time: 1023 Facilitators: [name] Rancher, LRT/CTRS Location: [address] Dayroom   Goal Area(s) Addresses:  Patient will successfully identify triggers. Patient will successfully identify ways to avoid triggers. Patient will successfully identify benefits of knowing triggers.    Group Description:  Triggers.  Patients were given a worksheet where they were to identify three things that triggered them the most.  Patients then identified ways to avoid dealing with those triggers and lastly ways to deal with triggers head on when they can't be avoided.  LRT and patients then discussed the impact triggers can have on overall well being.   Affect/Mood: Appropriate   Participation Level: Engaged   Participation Quality: Independent   Behavior: Appropriate   Speech/Thought Process: Relevant and Rational   Insight: Good   Judgement: Good   Modes of Intervention: Worksheet   Patient Response to Interventions:  Engaged   Education Outcome:  Acknowledges education and In group clarification offered    Clinical Observations/Individualized Feedback: Pt identified triggers as controlling people, negative people, sociopathic people and being misunderstood.  Pt avoids triggers by keeping control of her own company and set limits with the people mentioned in her life.  Pt faces them head on by not being micro managed, proving she in a way that makes them see you differently, not letting them change emotions and not being a people pleaser.  Pt went on to express her mother as her biggest trigger and wanting to come from up under mother to be more free.  At the same time, pt expressed the concern she has for her mother and wanting her to be safe.     Plan: Continue to engage patient in RT group sessions 2-3x/week.   [name] Rancher, LRT/CTRS 06/30/2021 10:59 AM

## 2021-06-30 NOTE — Progress Notes (Signed)
Psychoeducational Group Note  Date:  06/30/2021 Time:  2115  Group Topic/Focus:  Wrap-Up Group:   The focus of this group is to help patients review their daily goal of treatment and discuss progress on daily workbooks.  Participation Level: Did Not Attend  Participation Quality:  Not Applicable  Affect:  Not Applicable  Cognitive:  Not Applicable  Insight:  Not Applicable  Engagement in Group: Not Applicable  Additional Comments:  The patient did not attend group this evening.   Alan Riles S 06/30/2021, 9:15 PM

## 2021-06-30 NOTE — [facility] Group Notes (Signed)
Adult Psychoeducational Group Note  Date:  06/30/2021 Time:  8:42 AM  Group Topic/Focus:  Goals Group:   The focus of this group is to help patients establish daily goals to achieve during treatment and discuss how the patient can incorporate goal setting into their daily lives to aide in recovery.  Participation Level:  Active  Participation Quality:  Appropriate  Affect:  Appropriate  Cognitive:  Appropriate  Insight: Appropriate  Engagement in Group:  Improving  Modes of Intervention:  Clarification  Additional Comments:  Pt Goal: Focus on using art as my therapy  Donell Beers 06/30/2021, 8:42 AM

## 2021-07-01 MED ORDER — LORAZEPAM 0.5 MG PO TABS
ORAL_TABLET | ORAL | Status: AC
  Filled 2021-07-01: qty 1

## 2021-07-01 MED ORDER — LORAZEPAM 0.5 MG PO TABS
0.5000 mg | ORAL_TABLET | Freq: Three times a day (TID) | ORAL | Status: DC
  Administered 2021-07-01: 0.5 mg via ORAL

## 2021-07-01 MED ORDER — WHITE PETROLATUM EX OINT
TOPICAL_OINTMENT | CUTANEOUS | Status: AC
  Filled 2021-07-01: qty 5

## 2021-07-01 MED ORDER — LORAZEPAM 0.5 MG PO TABS
0.5000 mg | ORAL_TABLET | Freq: Three times a day (TID) | ORAL | Status: DC
  Administered 2021-07-01 – 2021-07-02 (×3): 0.5 mg via ORAL
  Filled 2021-07-01 (×4): qty 1

## 2021-07-01 MED ORDER — LORAZEPAM 0.5 MG PO TABS: 0.2500 mg | ORAL_TABLET | Freq: Three times a day (TID) | ORAL | Status: DC

## 2021-07-01 MED ORDER — LORAZEPAM 0.5 MG PO TABS: 0.5000 mg | ORAL_TABLET | Freq: Three times a day (TID) | ORAL | Status: DC

## 2021-07-01 NOTE — Progress Notes (Signed)
Pt spent much of the evening in her room    07/01/21 2000  Psych Admission Type (Psych Patients Only)  Admission Status Involuntary  Psychosocial Assessment  Patient Complaints Anxiety  Eye Contact Fair  Facial Expression Pensive;Wide-eyed  Affect Appropriate to circumstance  Speech Logical/coherent  Interaction Assertive;Manipulative;Needy  Motor Activity Slow  Appearance/Hygiene Unremarkable  Behavior Characteristics Cooperative  Mood Anxious;Suspicious;Preoccupied  Thought Process  Coherency WDL  Content WDL  Delusions None reported or observed  Perception WDL  Hallucination None reported or observed  Judgment Poor  Confusion None  Danger to Self  Current suicidal ideation? Denies  Danger to Others  Danger to Others None reported or observed

## 2021-07-01 NOTE — Progress Notes (Addendum)
   07/01/21 0312  Psych Admission Type (Psych Patients Only)  Admission Status Involuntary  Psychosocial Assessment  Patient Complaints None  Eye Contact Fair  Facial Expression Other (Comment) (appropriate)  Affect Appropriate to circumstance  Speech Logical/coherent  Interaction Assertive  Motor Activity Slow  Appearance/Hygiene Unremarkable  Behavior Characteristics Cooperative  Mood Other (Comment) (appropriate - pt sleepy)  Thought Process  Coherency WDL  Content WDL  Delusions None reported or observed  Perception WDL  Hallucination None reported or observed  Judgment Poor  Confusion None  Danger to Self  Current suicidal ideation? Denies  Danger to Others  Danger to Others None reported or observed   Pt has been asleep the entire shift. Pt woke up to check the time. Was assessed. Denies SI, HI, AVH and pain. "This is the first time I haven't had pain." Pt denies anxiety and depression. Pt yawning. Given her nighttime Zyprexa at this time. Not given clonidine. Pt doesn't voice any other complaints at this time. Pt assured that she will be tapered off of Ativan before discharge. Pt went back to her room to bed.

## 2021-07-01 NOTE — Plan of Care (Signed)
Spoke with Dr. Izora Ribas regarding patient's abnormal ECG findings today.  Dr. Izora Ribas states that ECG is abnormal because the leads were placed inappropriately and reversed. Appreciate Dr. Debby Bud input.  Park Pope, MD PGY1 Psychiatry Resident

## 2021-07-01 NOTE — [facility] Group Notes (Signed)
Pt didn't attend group. 

## 2021-07-01 NOTE — Group Note (Deleted)
LCSW Group Therapy Note   Group Date: 07/01/2021 Start Time: 1100 End Time: 1200   Type of Therapy and Topic:  Group Therapy:   Participation Level:  {[facility] PARTICIPATION LEVEL:22264}  Description of Group:   Therapeutic Goals:  1.     Summary of Patient Progress:    ***  Therapeutic Modalities:   Nour Scalise E Nayellie Sanseverino, LCSWA 07/01/2021  11:58 AM    

## 2021-07-01 NOTE — Progress Notes (Signed)
Pt EKG performed this morning. Print out placed on front of chart.
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
## 2021-07-01 NOTE — [facility] Group Notes (Signed)
  Spirituality group facilitated by Kathleen Argue, BCC.   Group Description: Group focused on topic of hope. Patients participated in facilitated discussion around topic, connecting with one another around experiences and definitions for hope. Group members engaged with visual explorer photos, reflecting on what hope looks like for them today. Group engaged in discussion around how their definitions of hope are present today in hospital.   Modalities: Psycho-social ed, Adlerian, Narrative, MI   Patient Progress: Genelda was in and out of group.  When she was in group, her comments were sometimes meandering and off topic, but she stated that she wanted to be redirected if that happened and she was able to be redirected at times.  Chaplain Katy Carman Essick, Bcc Pager, [phone] 5:10 PM

## 2021-07-01 NOTE — Progress Notes (Signed)
Pt didn't attend psycho-ed group.  

## 2021-07-01 NOTE — [facility] Group Notes (Signed)
[facility] Group Notes:  (Nursing/MHT/Case Management/Adjunct)  Date:  07/01/2021  Time:  2:46 PM  Type of Therapy:   Orientation/Goals group  Participation Level:  Active  Participation Quality:  Attentive  Affect:  Appropriate  Cognitive:  Appropriate  Insight:  Appropriate  Engagement in Group:  Engaged  Modes of Intervention:  Discussion and Orientation  Summary of Progress/Problems: Pt goal for today is to stay calm and carry on.   Stacy Poole J Palak Tercero 07/01/2021, 2:46 PM

## 2021-07-01 NOTE — Progress Notes (Signed)
Pt asleep. Pt has been asleep since beginning of shift. Pt awoke for snack while this writer off unit. When returned, pt already asleep again. MHT reported that pt stated she didn't need any medication to go back to sleep. Evening meds not given d/t pt being asleep.

## 2021-07-01 NOTE — Progress Notes (Signed)
Pt didn't attend therapeutic relaxation group. 

## 2021-07-01 NOTE — Group Note (Signed)
LCSW Group Therapy Note  Group Date: 07/01/2021 Start Time: 1100 End Time: 1200   Type of Therapy and Topic:  Group Therapy - Healthy vs Unhealthy Coping Skills  Participation Level:  Active   Description of Group The focus of this group was to determine what unhealthy coping techniques typically are used by group members and what healthy coping techniques would be helpful in coping with various problems. Patients were guided in becoming aware of the differences between healthy and unhealthy coping techniques. Patients were asked to identify 2-3 healthy coping skills they would like to learn to use more effectively.  Therapeutic Goals Patients learned that coping is what human beings do all day long to deal with various situations in their lives Patients defined and discussed healthy vs unhealthy coping techniques Patients identified their preferred coping techniques and identified whether these were healthy or unhealthy Patients determined 2-3 healthy coping skills they would like to become more familiar with and use more often. Patients provided support and ideas to each other   Summary of Patient Progress:  Patient participated in group. She was slightly intrusive but was redirectable.  Patient participated in muscle progression activity.  Patient engaged in conversation about relaxation techniques and discussed how stress can affect the body.    Therapeutic Modalities Cognitive Behavioral Therapy Motivational Interviewing  Beatris Si, LCSWA 07/01/2021  12:13 PM

## 2021-07-01 NOTE — Progress Notes (Signed)
Pt denies SI/HI/AVH and verbally agrees to approach staff if these become apparent or before harming themselves/others. Rates depression 0/10. Rates anxiety 4/10. Rates pain 5/10 in her head. Pt stated that her goal was to"keep calm and carry on." Pt stated that her mood was "in control." Pt was crying loudly in the hallway this morning. Pt was at the med window and complained that when the ativan starts to wear off, it is causing her to be emotional and anxious. Pt is preoccupied with and still wants to be tapered off of ativan. Pt is being needy. Pt is constantly needing attention and "telling stories." Pt seen pacing. Pt gets agitated if pt does not feel like she is being listened to. Pt is preoccupied with the medications. Pt is very talkative. Scheduled medications administered to Pt, per MD orders. RN provided support and encouragement to Pt. Q15 min safety checks implemented and continued. Pt safe on the unit. RN will continue to monitor and intervene as needed.    07/01/21 0800  Psych Admission Type (Psych Patients Only)  Admission Status Involuntary  Psychosocial Assessment  Patient Complaints Anxiety;Crying spells (pain)  Eye Contact Fair  Facial Expression Pensive;Wide-eyed  Affect Appropriate to circumstance  Speech Logical/coherent  Interaction Assertive;Manipulative;Needy  Motor Activity Slow  Appearance/Hygiene Unremarkable  Behavior Characteristics Cooperative;Calm;Restless;Intrusive  Mood Other (Comment) (appropriate)  Thought Process  Coherency WDL  Content WDL  Delusions None reported or observed  Perception WDL  Hallucination None reported or observed  Judgment Poor  Confusion None  Danger to Self  Current suicidal ideation? Denies  Danger to Others  Danger to Others None reported or observed

## 2021-07-02 MED ORDER — LORAZEPAM 0.5 MG PO TABS: 0.2500 mg | ORAL_TABLET | Freq: Three times a day (TID) | ORAL | Status: AC

## 2021-07-02 MED ORDER — LORAZEPAM 0.5 MG PO TABS: 0.2500 mg | ORAL_TABLET | Freq: Three times a day (TID) | ORAL | Status: DC

## 2021-07-02 NOTE — Group Note (Signed)
LCSW Group Therapy Note  Group Date: 07/02/2021 Start Time: 1130 End Time: 1200   Type of Therapy and Topic:  Group Therapy: Anger Cues and Responses  Participation Level:  Active   Description of Group:   In this group, patients learned how to recognize the physical, cognitive, emotional, and behavioral responses they have to anger-provoking situations.  They identified a recent time they became angry and how they reacted.  They analyzed how their reaction was possibly beneficial and how it was possibly unhelpful.  The group discussed a variety of healthier coping skills that could help with such a situation in the future.  Focus was placed on how helpful it is to recognize the underlying emotions to our anger, because working on those can lead to a more permanent solution as well as our ability to focus on the important rather than the urgent.  Therapeutic Goals: Patients will remember their last incident of anger and how they felt emotionally and physically, what their thoughts were at the time, and how they behaved. Patients will identify how their behavior at that time worked for them, as well as how it worked against them. Patients will explore possible new behaviors to use in future anger situations. Patients will learn that anger itself is normal and cannot be eliminated, and that healthier reactions can assist with resolving conflict rather than worsening situations.  Summary of Patient Progress:  Stacy Poole was active during the group. She shared a recent occurrence wherein news about [place] led to anger, which led to her starting to smoke cigarettes again.  She also called 911 and then "resolved that, but I don't want to talk about it." She demonstrated growing insight into the subject matter, was respectful of peers, and participated throughout the entire session.  Therapeutic Modalities:   Cognitive Behavioral Therapy    Lynnell Chad, LCSWA 07/02/2021  1:16 PM

## 2021-07-02 NOTE — [facility] Group Notes (Addendum)
Psychoeducational Group Note  Date: 06/30/2021 Time: 0900-1000    Goal Setting   Purpose of Group: This group helps to provide patients with the steps of setting a goal that is specific, measurable, attainable, realistic and time specific. A discussion on how we keep ourselves stuck with negative self talk.    Participation Level:  Active  Participation Quality:  Appropriate  Affect:  Appropriate  Cognitive:  Appropriate  Insight:  Improving  Engagement in Group:  Engaged  Additional Comments:  Pt. Rates her energy level as a 9/10. Pt states that she is feeling a little better and that she feels calmer over all.   Dione Housekeeper

## 2021-07-02 NOTE — Progress Notes (Signed)
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 Pt states that this allows them to discuss the [employer] and the trauma caused by them freely. Pt had to leave the lunchroom because they this was agitating others. Pt showed no insight into the inappropriate topic that was being shared to poc. Pt continues to be needy and med seeking, often approaching the medication window only when others have asked for prn's. Pt continues to feel they are on track in progression for discharge. Pt denies si/hi/ah/vh and verbally agrees to approach staff if these become apparent or before harming themselves/others while at [facility].  A: Pt provided support and encouragement. Pt given medication per protocol and standing orders. Q60m safety checks implemented and continued.  R: Pt safe on the unit. Will continue to monitor.

## 2021-07-02 NOTE — Group Note (Signed)
Group Topic: Goal Setting  Group Date: 07/02/2021 Start Time: 0900 End Time: 0930 Facilitators: Alvine Mostafa, Deforest Hoyles, NT  Department: [facility] INPATIENT ADULT [facility]  Number of Participants: 8  Interventions utilized were orientation  Name: AMYIA LODWICK Date of Birth: Dec 01, 1990  MR: [id number]    Level of Participation:Pt did attend morning goals group.

## 2021-07-02 NOTE — [facility] Group Notes (Signed)
[facility] Group Notes:  (Nursing/MHT/Case Management/Adjunct)  Date:  07/02/2021  Time:  9:40 PM  Type of Therapy:  Psychoeducational Skills  Participation Level:  Did Not Attend  Participation Quality:   Did not attend  Affect:   did not attend  Cognitive:   did not attend  Insight:  None  Engagement in Group:   did not attend  Modes of Intervention:   Did not attend  Summary of Progress/Problems:  Bethann Punches 07/02/2021, 9:40 PM

## 2021-07-02 NOTE — Group Note (Signed)
Group Topic: Other  Group Date: 07/02/2021 Start Time: 1000 End Time: 1100 Facilitators: Avante Carneiro, Deforest Hoyles, NT  [facility]  Number of Participants: 8  Group Focus: activities of daily living skills, daily focus, and nursing group  Name: Stacy Poole Date of Birth: July 15, 1991  MR: [id number]    Level of Participation: Pt did attend nursing psycho ed group.
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
## 2021-07-02 NOTE — Plan of Care (Signed)
  Problem: Health Behavior/Discharge Planning: Goal: Ability to make decisions will improve Outcome: Progressing   Problem: Safety: Goal: Ability to identify and utilize support systems that promote safety will improve Outcome: Progressing

## 2021-07-03 LAB — CBC WITH DIFFERENTIAL/PLATELET
Abs Immature Granulocytes: 0.03 10*3/uL (ref 0.00–0.07)
Basophils Absolute: 0.1 10*3/uL (ref 0.0–0.1)
Basophils Relative: 1 %
Eosinophils Absolute: 1 10*3/uL — ABNORMAL HIGH (ref 0.0–0.5)
Eosinophils Relative: 15 %
HCT: 40.9 % (ref 36.0–46.0)
Hemoglobin: 13.3 g/dL (ref 12.0–15.0)
Immature Granulocytes: 0 %
Lymphocytes Relative: 33 %
Lymphs Abs: 2.2 10*3/uL (ref 0.7–4.0)
MCH: 29.1 pg (ref 26.0–34.0)
MCHC: 32.5 g/dL (ref 30.0–36.0)
MCV: 89.5 fL (ref 80.0–100.0)
Monocytes Absolute: 0.7 10*3/uL (ref 0.1–1.0)
Monocytes Relative: 11 %
Neutro Abs: 2.7 10*3/uL (ref 1.7–7.7)
Neutrophils Relative %: 40 %
Platelets: 262 10*3/uL (ref 150–400)
RBC: 4.57 MIL/uL (ref 3.87–5.11)
RDW: 14.2 % (ref 11.5–15.5)
WBC: 6.8 10*3/uL (ref 4.0–10.5)
nRBC: 0 % (ref 0.0–0.2)

## 2021-07-03 LAB — HEPATIC FUNCTION PANEL
ALT: 22 U/L (ref 0–44)
AST: 17 U/L (ref 15–41)
Albumin: 3.8 g/dL (ref 3.5–5.0)
Alkaline Phosphatase: 46 U/L (ref 38–126)
Bilirubin, Direct: 0.1 mg/dL (ref 0.0–0.2)
Indirect Bilirubin: 0.3 mg/dL (ref 0.3–0.9)
Total Bilirubin: 0.4 mg/dL (ref 0.3–1.2)
Total Protein: 6.8 g/dL (ref 6.5–8.1)

## 2021-07-03 LAB — CARBAMAZEPINE LEVEL, TOTAL: Carbamazepine Lvl: 7.4 ug/mL (ref 4.0–12.0)

## 2021-07-03 MED ORDER — OLANZAPINE 5 MG PO TBDP
5.0000 mg | ORAL_TABLET | Freq: Once | ORAL | Status: AC
  Administered 2021-07-03: 5 mg via ORAL
  Filled 2021-07-03 (×2): qty 1

## 2021-07-03 MED ORDER — OLANZAPINE 10 MG PO TABS
10.0000 mg | ORAL_TABLET | Freq: Every day | ORAL | Status: DC
  Administered 2021-07-04 – 2021-07-05 (×2): 10 mg via ORAL
  Filled 2021-07-03 (×4): qty 1

## 2021-07-03 NOTE — Group Note (Signed)
LCSW Group Therapy Note   Group Date: 07/03/2021 Start Time: 1100 End Time: 1200   Type of Therapy and Topic:  Group Therapy: Boundaries  Participation Level:  Active  Description of Group: This group will address the use of boundaries in their personal lives. Patients will explore why boundaries are important, the difference between healthy and unhealthy boundaries, and negative and postive outcomes of different boundaries and will look at how boundaries can be crossed.  Patients will be encouraged to identify current boundaries in their own lives and identify what kind of boundary is being set. Facilitators will guide patients in utilizing problem-solving interventions to address and correct types boundaries being used and to address when no boundary is being used. Understanding and applying boundaries will be explored and addressed for obtaining and maintaining a balanced life. Patients will be encouraged to explore ways to assertively make their boundaries and needs known to significant others in their lives, using other group members and facilitator for role play, support, and feedback.  Therapeutic Goals:  1.  Patient will identify areas in their life where setting clear boundaries could be  used to improve their life.  2.  Patient will identify signs/triggers that a boundary is not being respected. 3.  Patient will identify two ways to set boundaries in order to achieve balance in  their lives: 4.  Patient will demonstrate ability to communicate their needs and set boundaries  through discussion and/or role plays  Summary of Patient Progress:  Stacy Poole was intermittently present/active throughout the session and had a hard time remaining on task.  At one point she left while apologizing for getting off task and return later, stating she had taken a Zyprexa Zydis pill that still remained under her tongue. Patient demonstrated poor insight into the subject matter, but was respectful of  peers, and was present throughout the entire session.  Therapeutic Modalities:   Cognitive Behavioral Therapy Solution-Focused Therapy  Lynnell Chad, LCSWA 07/03/2021  2:56 PM

## 2021-07-03 NOTE — Progress Notes (Signed)
Pt continues to be impulsive but redirectable. Pt complied with scheduled medications. Pt currently denies SI/ HI/AVH but rates head pain 6/10 and was given PRN, see MAR pain to this writer during the shift. Pt endorses anxiety and request PRN meds for anxiety. Pt interacts appropriately with others in the milieu. Pt verbally contracted for safety.   A: Support and encouragement was offered and accepted. Pt medications were administered per order. Safety rounds continued and maintained Q 15 throughout the shift.    R: Safety maintained. Will continue to monitor and assess.      07/03/21 1500  Psych Admission Type (Psych Patients Only)  Admission Status Involuntary  Psychosocial Assessment  Patient Complaints Anxiety  Eye Contact Fair  Facial Expression Anxious;Pensive  Affect Anxious  Child psychotherapist Assertive;Attention-seeking;Manipulative  Motor Activity Slow  Appearance/Hygiene Unremarkable  Behavior Characteristics Cooperative;Anxious;Fidgety;Intrusive;Impulsive  Mood Anxious  Thought Process  Coherency Concrete thinking;Flight of ideas  Content Compulsions;Obsessions;Preoccupation  Delusions Paranoid  Perception Derealization  Hallucination None reported or observed  Judgment Poor  Confusion None  Danger to Self  Current suicidal ideation? Denies  Danger to Others  Danger to Others None reported or observed

## 2021-07-03 NOTE — [facility] Group Notes (Signed)
RN Psychoeducation group was held.  ''A hole in my sidewalk'' poem by Karren Burly was read and patients were asked to reflect in identifying negative behavioral patterns, as it relates to the poem. They were then asked to identify ways in which they could change this behavior, or make a different choice.  Pt was intrusive in the group, manic going off topic. She stated '' she has to smoke cigarettes for her job and that is a negative behavioral pattern. '' She then related to alcohol and drug addiction as a negative pattern.

## 2021-07-03 NOTE — Progress Notes (Signed)
Patient is calm this shift. Compliant with hs medications, requested for PRN trazodone for sleep reported little effect. C/O anxiety at 2353, PRN vistaril 25 mg PO given and effective. No adverse drug noted. Support and encouragement provided as needed.

## 2021-07-03 NOTE — Group Note (Signed)
Group Topic: Healthy Support Systems  Group Date: 07/03/2021 Start Time: 1000 End Time: 1020 Facilitators: Gwenyth Allegra  [facility]  Number of Participants: 5  Group Focus: Healthy Support Systems  Treatment Modality:  Patient-Centered Therapy Interventions utilized were patient education and support Purpose: enhance coping skills, express feelings, and increase insight  Name: Stacy Poole Date of Birth: [birth year]  MR: [id number]    Level of Participation: active Quality of Participation: attention seeking and attentive Interactions with others: gave feedback and intrusive Mood/Affect: anxious and appropriate   Patients Problems:  Patient Active Problem List   Diagnosis Date Noted   PTSD (post-traumatic stress disorder) 06/25/2021   Bipolar affective disorder, currently manic, severe, with psychotic features (HCC) 06/22/2021   Suicidal ideation    Schizoaffective disorder (HCC) 10/14/2019   Schizophrenia (HCC) 03/27/2019   Alcohol use disorder, severe, dependence (HCC) 03/05/2019   Alcohol abuse with alcohol-induced psychotic disorder, 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

## 2021-07-03 NOTE — Progress Notes (Deleted)
Psychoeducational Group Note  Date:  07/03/2021 Time:  2114  Group Topic/Focus:  Wrap-Up Group:   The focus of this group is to help patients review their daily goal of treatment and discuss progress on daily workbooks.  Participation Level: Did Not Attend  Participation Quality:  Not Applicable  Affect:  Not Applicable  Cognitive:  Not Applicable  Insight:  Not Applicable  Engagement in Group: Not Applicable  Additional Comments:  The patient did not attend group this evening.   [name] S 07/03/2021, 9:14 PM

## 2021-07-03 NOTE — Group Note (Signed)
Group Topic: Goal Setting  Group Date: 07/03/2021 Start Time: 0930 End Time: 1000 Facilitators: Gwenyth Allegra  [facility]  Number of Participants: 5  Group Focus: goals/reality orientation Treatment Modality:  Patient-Centered Therapy Interventions utilized were clarification, orientation, and support Purpose: The purpose of this group is to talk about daily goals, what will be done to obtain those goals, rules of the unit and staff introduction.  Name: Stacy Poole Date of Birth: 20-Jan-1991  MR: [id number]    Level of Participation: active Quality of Participation: attention seeking and disruptive Interactions with others: intrusive Mood/Affect: anxious      Patients Problems:  Patient Active Problem List   Diagnosis Date Noted   PTSD (post-traumatic stress disorder) 06/25/2021   Bipolar affective disorder, currently manic, severe, with psychotic features (HCC) 06/22/2021   Suicidal ideation    Schizoaffective disorder (HCC) 10/14/2019   Schizophrenia (HCC) 03/27/2019   Alcohol use disorder, severe, dependence (HCC) 03/05/2019   Alcohol abuse with alcohol-induced psychotic disorder, 

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
## 2021-07-04 ENCOUNTER — Encounter (HOSPITAL_COMMUNITY): Payer: Self-pay

## 2021-07-04 MED ORDER — CARBAMAZEPINE ER 200 MG PO TB12: 200.0000 mg | ORAL_TABLET | Freq: Two times a day (BID) | ORAL | 0 refills | Status: DC

## 2021-07-04 MED ORDER — HYDROXYZINE HCL 25 MG PO TABS: 25.0000 mg | ORAL_TABLET | Freq: Three times a day (TID) | ORAL | 0 refills | Status: DC | PRN

## 2021-07-04 MED ORDER — OLANZAPINE 10 MG PO TABS: 10.0000 mg | ORAL_TABLET | Freq: Every day | ORAL | 0 refills | Status: DC

## 2021-07-04 MED ORDER — LUMATEPERONE TOSYLATE 42 MG PO CAPS: 42.0000 mg | ORAL_CAPSULE | Freq: Every day | ORAL | 0 refills | Status: AC

## 2021-07-04 MED ORDER — NICOTINE 21 MG/24HR TD PT24: 21.0000 mg | MEDICATED_PATCH | Freq: Every day | TRANSDERMAL | 0 refills | Status: AC | PRN

## 2021-07-04 MED ORDER — CLONIDINE HCL 0.1 MG PO TABS: 0.1000 mg | ORAL_TABLET | Freq: Two times a day (BID) | ORAL | 0 refills | Status: DC

## 2021-07-04 MED ORDER — OLANZAPINE 20 MG PO TABS: 20.0000 mg | ORAL_TABLET | Freq: Every day | ORAL | 0 refills | Status: DC

## 2021-07-04 MED ORDER — FAMOTIDINE 20 MG PO TABS: 20.0000 mg | ORAL_TABLET | Freq: Every day | ORAL | 0 refills | Status: DC

## 2021-07-04 MED ORDER — TRAZODONE HCL 100 MG PO TABS: 100.0000 mg | ORAL_TABLET | Freq: Every evening | ORAL | 0 refills | Status: DC | PRN

## 2021-07-04 MED ORDER — LORATADINE 10 MG PO TABS: 10.0000 mg | ORAL_TABLET | Freq: Every day | ORAL | 0 refills | Status: DC

## 2021-07-04 NOTE — [facility] Group Notes (Signed)
[facility] Group Notes:  (Nursing/MHT/Case Management/Adjunct)  Date:  07/04/2021  Time:  10:41 AM  Type of Therapy:   Orientation/Goals group  Participation Level:  Active  Participation Quality:  Appropriate  Affect:  Anxious and Appropriate  Cognitive:  Alert and Appropriate  Insight:  Appropriate and Improving  Engagement in Group:  Engaged and Improving  Modes of Intervention:  Discussion, Education, and Orientation  Summary of Progress/Problems: Pt goal for today is to meditate, not be so manic and talk to the doctor about discharge plan.  Nolan Tuazon J Lihanna Biever 07/04/2021, 10:41 AM
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
## 2021-07-04 NOTE — BH IP Treatment Plan (Signed)
Interdisciplinary Treatment and Diagnostic Plan Update  07/04/2021 Time of Session: 10:20am Stacy Poole MRN: [id number]  Principal Diagnosis: Bipolar affective disorder, currently manic, severe, with psychotic features (HCC)  Secondary Diagnoses: Principal Problem:   Bipolar affective disorder, currently manic, severe, with psychotic features (HCC) Active Problems:   Tobacco use disorder   PTSD (post-traumatic stress disorder)   Current Medications:  Current Facility-Administered Medications  Medication Dose Route Frequency Provider Last Rate Last Admin   acetaminophen (TYLENOL) tablet 650 mg  650 mg Oral Q4H PRN Maryagnes Amos, FNP   650 mg at 06/29/21 1318   albuterol (VENTOLIN HFA) 108 (90 Base) MCG/ACT inhaler 1 puff  1 puff Inhalation TID PRN Maryagnes Amos, FNP   1 puff at 06/29/21 1316   alum & mag hydroxide-simeth (MAALOX/MYLANTA) 200-200-20 MG/5ML suspension 30 mL  30 mL Oral Q4H PRN Maryagnes Amos, FNP   30 mL at 06/23/21 1309   benztropine (COGENTIN) tablet 0.5 mg  0.5 mg Oral BID PRN Park Pope, MD   0.5 mg at 06/27/21 1629   carbamazepine (TEGRETOL XR) 12 hr tablet 200 mg  200 mg Oral BID Mason Jim, Amy E, MD   200 mg at 07/04/21 6606   cloNIDine (CATAPRES) tablet 0.1 mg  0.1 mg Oral BID Park Pope, MD   0.1 mg at 07/04/21 0817   famotidine (PEPCID) tablet 20 mg  20 mg Oral Daily Park Pope, MD   20 mg at 07/04/21 3016   folic acid (FOLVITE) tablet 1 mg  1 mg Oral Daily Maryagnes Amos, FNP   1 mg at 07/04/21 0818   hydrOXYzine (ATARAX/VISTARIL) tablet 25 mg  25 mg Oral TID PRN Maryagnes Amos, FNP   25 mg at 07/04/21 0543   ibuprofen (ADVIL) tablet 600 mg  600 mg Oral Q6H PRN Jackelyn Poling, NP   600 mg at 07/03/21 0953   loratadine (CLARITIN) tablet 10 mg  10 mg Oral Daily Park Pope, MD   10 mg at 07/04/21 0818   LORazepam (ATIVAN) tablet 1 mg  1 mg Oral Q6H PRN Comer Locket, MD       lumateperone tosylate (CAPLYTA) capsule  42 mg  42 mg Oral Daily Park Pope, MD   42 mg at 07/04/21 0800   magnesium hydroxide (MILK OF MAGNESIA) suspension 30 mL  30 mL Oral Daily PRN Maryagnes Amos, FNP       nicotine (NICODERM CQ - dosed in mg/24 hours) patch 21 mg  21 mg Transdermal Daily PRN Maryagnes Amos, FNP   21 mg at 07/03/21 0820   OLANZapine (ZYPREXA) tablet 10 mg  10 mg Oral Daily Park Pope, MD   10 mg at 07/04/21 0817   OLANZapine (ZYPREXA) tablet 20 mg  20 mg Oral QHS Mason Jim, Amy E, MD   20 mg at 07/03/21 2205   OLANZapine zydis (ZYPREXA) disintegrating tablet 5 mg  5 mg Oral Q8H PRN Comer Locket, MD   5 mg at 07/03/21 1452   traZODone (DESYREL) tablet 100 mg  100 mg Oral QHS PRN Park Pope, MD   100 mg at 07/02/21 2126   ziprasidone (GEODON) injection 20 mg  20 mg Intramuscular Q12H PRN Park Pope, MD       PTA Medications: Medications Prior to Admission  Medication Sig Dispense Refill Last Dose   albuterol (VENTOLIN HFA) 108 (90 Base) MCG/ACT inhaler Inhale 1 puff into the lungs 3 (three) times daily as needed for wheezing.  FLUoxetine (PROZAC) 10 MG capsule Take 10 mg by mouth daily.      folic acid (FOLVITE) 1 MG tablet Take 1 mg by mouth daily.      prazosin (MINIPRESS) 2 MG capsule Take 2 mg by mouth daily.      zolpidem (AMBIEN) 10 MG tablet Take 10 mg by mouth at bedtime.       Patient Stressors: Health problems   Medication change or noncompliance   Traumatic event    Patient Strengths: Ability for insight  Communication skills  Physical Health  Supportive family/friends   Treatment Modalities: Medication Management, Group therapy, Case management,  1 to 1 session with clinician, Psychoeducation, Recreational therapy.   Physician Treatment Plan for Primary Diagnosis: Bipolar affective disorder, currently manic, severe, with psychotic features (HCC) Long Term Goal(s): Improvement in symptoms so as ready for discharge   Short Term Goals: Ability to identify changes in  lifestyle to reduce recurrence of condition will improve Ability to verbalize feelings will improve Ability to demonstrate self-control will improve Ability to identify and develop effective coping behaviors will improve Ability to maintain clinical measurements within normal limits will improve Compliance with prescribed medications will improve Ability to identify triggers associated with substance abuse/mental health issues will improve  Medication Management: Evaluate patient's response, side effects, and tolerance of medication regimen.  Therapeutic Interventions: 1 to 1 sessions, Unit Group sessions and Medication administration.  Evaluation of Outcomes: Progressing  Physician Treatment Plan for Secondary Diagnosis: Principal Problem:   Bipolar affective disorder, currently manic, severe, with psychotic features (HCC) Active Problems:   Tobacco use disorder   PTSD (post-traumatic stress disorder)  Long Term Goal(s): Improvement in symptoms so as ready for discharge   Short Term Goals: Ability to identify changes in lifestyle to reduce recurrence of condition will improve Ability to verbalize feelings will improve Ability to demonstrate self-control will improve Ability to identify and develop effective coping behaviors will improve Ability to maintain clinical measurements within normal limits will improve Compliance with prescribed medications will improve Ability to identify triggers associated with substance abuse/mental health issues will improve     Medication Management: Evaluate patient's response, side effects, and tolerance of medication regimen.  Therapeutic Interventions: 1 to 1 sessions, Unit Group sessions and Medication administration.  Evaluation of Outcomes: Progressing   RN Treatment Plan for Primary Diagnosis: Bipolar affective disorder, currently manic, severe, with psychotic features (HCC) Long Term Goal(s): Knowledge of disease and therapeutic regimen  to maintain health will improve  Short Term Goals: Ability to remain free from injury will improve, Ability to verbalize frustration and anger appropriately will improve, Ability to demonstrate self-control, Ability to participate in decision making will improve, Ability to verbalize feelings will improve, Ability to identify and develop effective coping behaviors will improve, and Compliance with prescribed medications will improve  Medication Management: RN will administer medications as ordered by provider, will assess and evaluate patient's response and provide education to patient for prescribed medication. RN will report any adverse and/or side effects to prescribing provider.  Therapeutic Interventions: 1 on 1 counseling sessions, Psychoeducation, Medication administration, Evaluate responses to treatment, Monitor vital signs and CBGs as ordered, Perform/monitor CIWA, COWS, AIMS and Fall Risk screenings as ordered, Perform wound care treatments as ordered.  Evaluation of Outcomes: Progressing   LCSW Treatment Plan for Primary Diagnosis: Bipolar affective disorder, currently manic, severe, with psychotic features (HCC) Long Term Goal(s): Safe transition to appropriate next level of care at discharge, Engage patient in therapeutic group addressing  interpersonal concerns.  Short Term Goals: Engage patient in aftercare planning with referrals and resources, Increase social support, Increase ability to appropriately verbalize feelings, Increase emotional regulation, Facilitate acceptance of mental health diagnosis and concerns, Identify triggers associated with mental health/substance abuse issues, and Increase skills for wellness and recovery  Therapeutic Interventions: Assess for all discharge needs, 1 to 1 time with Social worker, Explore available resources and support systems, Assess for adequacy in community support network, Educate family and significant other(s) on suicide prevention,  Complete Psychosocial Assessment, Interpersonal group therapy.  Evaluation of Outcomes: Progressing   Progress in Treatment: Attending groups: Yes. Participating in groups: Yes. Taking medication as prescribed: Yes. Toleration medication: Yes. Family/Significant other contact made: Yes, individual(s) contacted:  mother Patient understands diagnosis: No. Discussing patient identified problems/goals with staff: Yes. Medical problems stabilized or resolved: Yes. Denies suicidal/homicidal ideation: Yes. Issues/concerns per patient self-inventory: No.   New problem(s) identified: No, Describe:  none  New Short Term/Long Term Goal(s): medication stabilization, elimination of SI thoughts, development of comprehensive mental wellness plan.    Patient Goals:  Did not attend  Discharge Plan or Barriers: Pt is to return to her apartment and continue with established providers   Reason for Continuation of Hospitalization: Delusions  Mania Medication stabilization  Estimated Length of Stay: 3-5 days   Scribe for Treatment Team: Otelia Santee, LCSW 07/04/2021 11:18 AM

## 2021-07-04 NOTE — [facility] Group Notes (Signed)
[facility] Group Notes:  (Nursing/MHT/Case Management/Adjunct)  Date:  07/04/2021  Time:  12:36 AM  Type of Therapy:  Psychoeducational Skills  Participation Level:  None  Participation Quality:  Inattentive  Affect:  Resistant  Cognitive:  Lacking  Insight:  None  Engagement in Group:  Lacking  Modes of Intervention:  Education  Summary of Progress/Problems: The patient did not attend group last evening.   Sade Hollon S 07/04/2021, 12:36 AM
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
## 2021-07-04 NOTE — Group Note (Signed)
LCSW Group Therapy Note   Group Date: 07/04/2021 Start Time: 1300 End Time: 1400   Type of Therapy and Topic:  Group Therapy: Challenging Core Beliefs  Participation Level:  Active  Description of Group:  Patients were educated about core beliefs and asked to identify one harmful core belief that they have. Patients were asked to explore from where those beliefs originate. Patients were asked to discuss how those beliefs make them feel and the resulting behaviors of those beliefs. They were then be asked if those beliefs are true and, if so, what evidence they have to support them. Lastly, group members were challenged to replace those negative core beliefs with helpful beliefs.   Therapeutic Goals:   1. Patient will identify harmful core beliefs and explore the origins of such beliefs. 2. Patient will identify feelings and behaviors that result from those core beliefs. 3. Patient will discuss whether such beliefs are true. 4.  Patient will replace harmful core beliefs with helpful ones.  Summary of Patient Progress:  Stacy Poole actively engaged in processing and exploring how core beliefs are formed and how they impact thoughts, feelings, and behaviors. Patient proved open to input from peers and feedback from CSW. Patient demonstrated good insight into the subject matter, was respectful and supportive of peers, and participated throughout the entire session.  Therapeutic Modalities: Cognitive Behavioral Therapy; Solution-Focused Therapy   Otelia Santee, LCSW 07/04/2021  1:51 PM

## 2021-07-04 NOTE — Plan of Care (Signed)
  Problem: Health Behavior/Discharge Planning: Goal: Compliance with therapeutic regimen will improve Outcome: Progressing   Problem: Safety: Goal: Ability to disclose and discuss suicidal ideas will improve Outcome: Progressing   

## 2021-07-04 NOTE — Plan of Care (Signed)
Spoke with Sanvika Cuttino (mother) [phone].  Mother feels that patient has greatly improved since admission.  Mother has encouraged patient to reality test and to confront her delusions.  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  Mother states that the patient mentions how she uses "being in the [employer]" as a way to "get people to leave me alone".  Mother feels that patient stay would only further exacerbate patient's current mental health problems given that patient tends to become much more aggravated and frustrated when she has prolonged her stay at any hospital.   Discussed with mother the variety of medications patient's on and discussed possible side effects that she should look out for.  Mother did mention how she was somewhat concerned yesterday when she spoke with patient since patient appeared a little "up".  Discussed with mother this is likely due patient being informed that she would not be discharged today.  Mother verbalized understanding and states that this is a normal reaction for her once discharge planning has gone straight.  Mother appreciates help that healthcare team has provided with patient.  Mother is okay with picking up patient tomorrow at 1 PM.   Park Pope, MD  PGY1 Psychiatry Resident

## 2021-07-04 NOTE — Progress Notes (Signed)
Patient in bed most of shift. No interaction with peers noted. Writer attempted to speak with patient on how she was improving. Patient states "I dont feel like talking, I just want to take my meds and go back to bed" Medications given and taken as prescribed. Pt did state that when her daughter left, her life has been a mess ever since. Encouragement and support provided. Safety checks maintained. Medications given as prescribed. Pt receptive and remains safe on unit with q 15 min checks.

## 2021-07-04 NOTE — [facility] Group Notes (Signed)
[facility] Group Notes:  (Nursing/MHT/Case Management/Adjunct)  Date:  07/04/2021  Time:  5:17 PM  Type of Therapy:  Psychoeducational Skills  Participation Level:  Active  Participation Quality:  Appropriate  Affect:  Appropriate  Cognitive:  Appropriate  Insight:  Appropriate and Good  Engagement in Group:  Engaged  Modes of Intervention:  Discussion, Education, and Support  Summary of Progress/Problems: The topic was wellness, preferably mental wellness. We discussed how it includes our emotional, psychological, and social well-being. It affects how we think, feel, and act. We discussed also helps determine how we handle stress, relate to others, and make healthy choices.  Stacy Poole J Stacy Poole 07/04/2021, 5:17 PM

## 2021-07-04 NOTE — Progress Notes (Signed)
Pt didn't attend therapeutic relaxation group. 

## 2021-07-04 NOTE — [facility] Counselor (Signed)
Events affecting the discharge plan:  - Awaiting MD approval   Interventions by CSW:  - No interventions were required for this patients carte today.  - Patient is to return home to her apartment.  - Patient is to follow up with established provider [facility] for court ordered substance use counseling.  Emotional response of the patient/family to the plan of care: - Patient is agreeable to her discharge plans.     Ruthann Cancer MSW, LCSW Clincal Social Worker  [facility]

## 2021-07-04 NOTE — [facility] Group Notes (Signed)
[facility] Group Notes:  (Nursing/MHT/Case Management/Adjunct)  Date:  07/04/2021  Time:  10:40 PM  Type of Therapy:   Wrap up group  Participation Level:  Did Not Attend  Participation Quality:    Affect:    Cognitive:    Insight:    Engagement in Group:   Did not attend  Modes of Intervention:  Discussion and Education  Summary of Progress/Problems:  Arienne did not attend evening wrap up group despite personal invitation.  Norm Parcel Yaffa Seckman 07/04/2021, 10:40 PM

## 2021-07-05 NOTE — Group Note (Signed)
Recreation Therapy Group Note   Group Topic:Self-Esteem  Group Date: 07/05/2021 Start Time: 1000 End Time: 1035 Facilitators: Bjorn Loser, NT Location: [address] Dayroom   Goal Area(s) Addresses:  Patient will be able to identify what influences self esteem. Patient will be able to identify positive traits about themselves. Patient will identify importance of having positive self esteem.    Group Description: Patients and LRT discussed what influences self esteem.  Patients and LRT talked about how internal and external factors can influence how one feels about themselves.  Patients were then given a picture of a blank face, and told to illustrate and describe how they see themselves. Patients were given colored pencils, markers, and crayons to complete the assignment. Patients shared their completed assignment with each other.  Patients were also asked to identify how others see them.  Patients and LRT debriefed on the importance of having confidence in ones self and not letting the negative outshine the positive.   Affect/Mood: Flat   Participation Level: Minimal   Participation Quality: Independent   Behavior: Hesitant   Speech/Thought Process: Distracted   Insight: Fair   Judgement: Fair    Modes of Intervention: Art   Patient Response to Interventions:  Resistant    Education Outcome:  Acknowledges education and In group clarification offered    Clinical Observations/Individualized Feedback: Pt asked if she had to attend group because of not wanting to be influenced by outside forces.  LRT told pt it was of her choosing to stay or leave.  Pt left but later returned halfway through group.  Pt got a picture and ripped it then threw it a way saying it was conceptual art.  Pt stated it represented her face that is "not there".    Plan: Continue to engage patient in RT group sessions 2-3x/week.   [name] Rancher, LRT/CTRS  07/05/2021 12:24 PM

## 2021-07-05 NOTE — [facility] Group Notes (Signed)
[facility] Group Notes:  (Nursing/MHT/Case Management/Adjunct)  Date:  07/05/2021  Time:  10:13 AM  Type of Therapy:   Orientation/Goals group  Participation Level:  Active  Participation Quality:  Appropriate  Affect:  Appropriate  Cognitive:  Appropriate  Insight:  Appropriate  Engagement in Group:  Engaged  Modes of Intervention:  Discussion and Orientation  Summary of Progress/Problems: Pt goal for today is to be patient and read until it is time for discharge.   Govanni Plemons J Shermika Balthaser 07/05/2021, 10:13 AM

## 2021-07-05 NOTE — Progress Notes (Signed)
Patient calm this shift. C/O anxiety around 2 am refused PRN vistaril stating I want to go home I was told not to take PRN or else I will not go. Patient was reassured and offered medication but refused. Patient in bed sleeping respirations noted. Q 15 minutes safety checks ongoing without self harm gesture.

## 2021-07-05 NOTE — Plan of Care (Signed)
Patient was able to participate in recreation therapy groups with a calm and appropriate mood.    [name] Rancher, LRT/CTRS

## 2021-07-05 NOTE — Progress Notes (Signed)
Pt discharged to lobby. Pt was stable and appreciative at that time. All papers and prescriptions were given and valuables returned. Verbal understanding expressed. Denies SI/HI and A/VH. Pt given opportunity to express concerns and ask questions.  
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
## 2021-07-05 NOTE — Progress Notes (Signed)
   07/05/21 1000  Psych Admission Type (Psych Patients Only)  Admission Status Involuntary  Psychosocial Assessment  Patient Complaints None  Eye Contact Fair  Facial Expression Anxious;Pensive  Affect Anxious  Child psychotherapist Assertive;Attention-seeking;Manipulative  Motor Activity Slow  Appearance/Hygiene Unremarkable  Behavior Characteristics Cooperative  Mood Pleasant  Aggressive Behavior  Targets Other (Comment) (n/a)  Type of Behavior Other (Comment) (wdl)  Effect No apparent injury  Thought Process  Coherency Concrete thinking  Content Preoccupation  Delusions None reported or observed  Perception Derealization  Hallucination None reported or observed  Judgment Poor  Confusion None  Danger to Self  Current suicidal ideation? Denies  Danger to Others  Danger to Others None reported or observed

## 2021-07-05 NOTE — Progress Notes (Signed)
Recreation Therapy Notes  INPATIENT RECREATION TR PLAN  Patient Details Name: Stacy Poole MRN: [id number] DOB: May 05, 1991 Today's Date: 07/05/2021  Rec Therapy Plan Is patient appropriate for Therapeutic Recreation?: Yes Treatment times per week: about 3 days Estimated Length of Stay: 5-7 days TR Treatment/Interventions: Group participation (Comment)  Discharge Criteria Pt will be discharged from therapy if:: Discharged Treatment plan/goals/alternatives discussed and agreed upon by:: Patient/family  Discharge Summary Short term goals set: See care plan Short term goals met: Complete Progress toward goals comments: Groups attended Which groups?: Self-esteem, Other (Comment) (Personal Development, Problem Solving, Team Building, Self Expression) Reason goals not met: None Therapeutic equipment acquired: N/A Reason patient discharged from therapy: Discharge from hospital Pt/family agrees with progress & goals achieved: Yes Date patient discharged from therapy: 07/05/21    Victorino Sparrow, LRT/CTRS Ria Comment, [place] 07/05/2021, 1:13 PM

## 2021-07-05 NOTE — Progress Notes (Signed)
  [facility] Adult Case Management Discharge Plan :  Will you be returning to the same living situation after discharge:  Yes,  home to apartment At discharge, do you have transportation home?: Yes,  mother to pick this patient up Do you have the ability to pay for your medications: Yes,  has insurance   Release of information consent forms completed and in the chart;  Patient's signature needed at discharge.  Patient to Follow up at:  Follow-up Information     [facility] Follow up on 07/12/2021.   Why: You also have an appointment for medication management on 07/12/21 at 2:45 pm. You also have an appointment for therapy services on 07/21/21 at 9:00 am.  These will be a Virtual telehealth appointments. Contact information: [address] [place] [address] [phone]         [facility] Call.   Why: Please call to schedule an appointment with your provider for trauma-focused therapy services. Contact information: [address] [place] [address] [phone]         [facility]. Go on 07/11/2021.   Specialty: Behavioral Health Why: You have an appointment for substance abuse intensive outpatient therapy services on 07/11/21 at 10:00 am.  Please attend substance use counseling at this agency to complete required 20 hours for court requirement. * (If the court order involves a DUI, the patient is responsible for the $100 assessment fee) Contact information: [address] [place] [address] [phone]                 Next level of care provider has access to [facility] Link:no  Safety Planning and Suicide Prevention discussed: Yes,  with mother     Has patient been referred to the Quitline?: N/A patient is not a smoker  Patient has been referred for addiction treatment: Pt. refused referral  Otelia Santee, LCSW 07/05/2021, 10:00 AM

## 2021-08-30 ENCOUNTER — Ambulatory Visit (HOSPITAL_COMMUNITY)
Admission: RE | Admit: 2021-08-30 | Discharge: 2021-08-30 | Disposition: A | Discharge status: Home / Self Care | Payer: BC Managed Care – PPO | Attending: Psychiatry | Admitting: Psychiatry

## 2021-08-30 ENCOUNTER — Emergency Department (HOSPITAL_COMMUNITY)
Admission: EM | Admit: 2021-08-30 | Discharge: 2021-08-30 | Disposition: A | Discharge status: Home / Self Care | Payer: BC Managed Care – PPO | Attending: Emergency Medicine | Admitting: Emergency Medicine

## 2021-08-30 DIAGNOSIS — F4310 Post-traumatic stress disorder, unspecified: Secondary | ICD-10-CM | POA: Insufficient documentation

## 2021-08-30 DIAGNOSIS — Z5321 Procedure and treatment not carried out due to patient leaving prior to being seen by health care provider: Secondary | ICD-10-CM | POA: Diagnosis not present

## 2021-08-30 NOTE — ED Notes (Signed)
Pt not noted to be in the lobby when called for triage.

## 2021-08-30 NOTE — H&P (Signed)
Behavioral Health Medical Screening Exam  Stacy Poole is a 30 y.o. female who presented to [facility] as a voluntary walk-in for evaluation of inpatient hospitalization for anxiety and substance abuse. Her mother drove her to [facility], helped fill out the intake form and left to go to work. Patient was seen by this provider and TTS clinician Stacy Poole. Patient has a psychiatric history significant for schizoaffective disorder, bipolar type, PTSD, severe alcohol use disorder, cannabis use disorder, stimulant induced psychotic disorder with hallucinations, and reported history of pseudotumor cerebri (10 years ago).  Patient was discharged from [facility] inpatient unit on 11/1 after a 13 day stay.  When discharged on 11/1, she had an an intake appointment at [facility] scheduled for 11/17 for SAIOP to complete 20 hours as a court requirement for DUI. Today, she denies any legal issues with the exception of getting a DUI in Oct 2021. She did not mention the court requirement. She stated her mother could not drive her to [facility] for [facility] because she has to work so she did not go for her treatment there. She stated she did complete SAIOP at [facility] in 2013.    She has been admitted to [facility], [facility], [facility] multiple times and [facility]. By her count she has had at least 10-12 inpatient psychiatric admissions. When discharged from [facility] on 11/1, she also had an appointment scheduled with her outpatient providers at [facility] for medication management on 11/8 and therapy on 11/17. She stated she missed her appointment for medication management. She also sees a therapist at [facility]. She stated she does not take her mental health medications like she is supposed too and denies that she needs a mood stabilizer. She could not remember when she took them last.   Today, patient stated she had been drinking last night and feels like she is in withdrawal.  She stated she drank 1 bottle of wine, a 40 oz beer and some Guinness. Age of first drink was 15, she drinks daily but has had periods of sobriety. She stated she also did some CBD and Kratom. She was unable to state how much of each she was doing. She stated she started using Kratom when Trump was president and missed "most of the Trump administration."    Patient stated she drinks until she blacks out. She stated she is in withdrawal today and needs Ativan. She stated she has had diarrhea and one episode of vomiting this morning. She stated she had an unwitnessed seizure this morning. She stated she is having PTSD flashbacks every minute. She will not say what her PTSD is caused by. She perseverates on needing to be inpatient and that she is in withdrawal.  Patient is neatly groomed and dressed appropriately for the weather. She maintains fair eye contact. She is unable to say how many hours of sleep she is getting per night and reported a fair appetite. She is somewhat bizarre in her presentation and some of the comments she makes. She stated she saw on TV where stuff is going down in [place] and people who commit genocide make her want to kill them." She denies she has homicidal ideation toward any one person. She denies suicidal ideation, plan or intent. She stated she "half-assed" a suicide attempt when she was 18 by drinking Robitussin. She denies auditory and visual hallucinations, paranoia and delusions. Based on patient's long psychiatric history and medication noncompliance she will be recommended  for inpatient admission. Patient will be transported to [facility] for medical clearance.   Of note: At 3:18 pm this provider received notice that the patient was not present in the ED lobby when her name was called for triage. Patient left AMA.    Collateral from patient's mother Stacy Poole [phone]: She stated her daughter called her today and asked her to bring her to [facility] for evaluation. She stated "I  know my daughter does not take her medications like she is supposed to, she does not ever take a mood stabilizer because she says she does not need one." Patient's mother stated she did not see empty alcohol bottles in her apartment today so she doesn't believe she is actively drinking. She stated " truly alcohol is not my daughter's biggest issue, her PTSD is." We discussed that bringing her daughter inpatient repeatedly may not be beneficial if she won't take her medications when discharged. When this provider suggested that at some point someone may need to take over patient's medications to be sure she is taking them she replied, "She is 30 years old, does not live with me and I can't make her take her medications. If she is in a mental health crisis and can't be turned away." This provider thanked patient's mother for her insight and reassured her we would attempt to get her daughter admitted to a psychiatric hospital for stabilization. Patient's mother wanted it noted that patient is not to be admitted to [facility] because it has "ever gone well and terrible things have happened to my daughter there." Patient's mother requested to be updated on patient's status.   Total Time spent with patient: 30 minutes  Psychiatric Specialty Exam:  Presentation  General Appearance: Appropriate for Environment; Casual; Fairly Groomed  Eye Contact:Fair  Speech:Clear and Coherent; Normal Rate  Speech Volume:Normal  Handedness:Right  Mood and Affect  Mood:Anxious; Depressed  Affect:Appropriate; Congruent; Depressed  Thought Process  Thought Processes:Coherent; Goal Directed  Descriptions of Associations:Intact  Orientation:Full (Time, Place and Person)  Thought Content:Paranoid Ideation; Rumination  History of Schizophrenia/Schizoaffective disorder:Yes  Duration of Psychotic Symptoms:Greater than six months  Hallucinations:Hallucinations: None (patient denies)  Ideas of Reference:None  (patient denies)  Suicidal Thoughts:Suicidal Thoughts: No (patient denies)  Homicidal Thoughts:Homicidal Thoughts: No  Sensorium  Memory:Immediate Fair; Recent Fair; Remote Fair  Judgment:Fair  Insight:Shallow  Executive Functions  Concentration:Fair  Attention Span:Fair  Recall:Fair  Fund of Knowledge:Fair  Language:Fair  Psychomotor Activity  Psychomotor Activity:Psychomotor Activity: Normal  Assets  Assets:Communication Skills; Financial Resources/Insurance; Housing; Physical Health; Resilience; Social Support; Vocational/Educational   Sleep  Sleep:Sleep: Fair (patient unable to report hours of sleep sytaed "I don't know")    Physical Exam: Physical Exam Vitals reviewed.  HENT:     Head: Normocephalic and atraumatic.     Nose: Nose normal.  Eyes:     Pupils: Pupils are equal, round, and reactive to light.  Pulmonary:     Effort: Pulmonary effort is normal.  Musculoskeletal:        General: Normal range of motion.     Cervical back: Normal range of motion.  Neurological:     General: No focal deficit present.     Mental Status: She is oriented to person, place, and time.  Psychiatric:        Attention and Perception: Attention normal. She does not perceive auditory or visual hallucinations.        Mood and Affect: Mood is anxious and depressed.  Speech: Speech normal.        Behavior: Behavior normal. Behavior is cooperative.        Thought Content: Thought content is paranoid. Thought content is not delusional. Thought content does not include homicidal or suicidal ideation. Thought content does not include homicidal or suicidal plan.        Cognition and Memory: Cognition normal.   Review of Systems  Constitutional:  Negative for fever.  HENT:  Negative for congestion and sore throat.   Respiratory:  Negative for cough and shortness of breath.   Cardiovascular:  Negative for chest pain.  Gastrointestinal:  Positive for diarrhea (patient  reported) and vomiting (patient reported 1 episode this AM).  Musculoskeletal: Negative.   Neurological:  Negative for tremors and headaches.  There were no vitals taken for this visit. There is no height or weight on file to calculate BMI.  Musculoskeletal: Strength & Muscle Tone: within normal limits Gait & Station: normal Patient leans: N/A  Recommendations:  Based on my evaluation the patient does not appear to have an emergency medical condition. Patient was transported to [facility] for medical clearance. Patient was not present in the lobby when her name was called for triage. Patient left [facility] lobby, AMA.   Laveda Abbe, NP 08/30/2021, 5:08 PM
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
## 2021-08-31 ENCOUNTER — Emergency Department (HOSPITAL_COMMUNITY)
Admission: EM | Admit: 2021-08-31 | Discharge: 2021-09-01 | Disposition: A | Discharge status: Other Acute Inpatient Hospital | Payer: BC Managed Care – PPO | Source: Home / Self Care | Attending: Emergency Medicine | Admitting: Emergency Medicine

## 2021-08-31 ENCOUNTER — Encounter (HOSPITAL_COMMUNITY): Payer: Self-pay | Admitting: *Deleted

## 2021-08-31 DIAGNOSIS — Z87891 Personal history of nicotine dependence: Secondary | ICD-10-CM | POA: Insufficient documentation

## 2021-08-31 DIAGNOSIS — Y908 Blood alcohol level of 240 mg/100 ml or more: Secondary | ICD-10-CM | POA: Insufficient documentation

## 2021-08-31 DIAGNOSIS — F1020 Alcohol dependence, uncomplicated: Secondary | ICD-10-CM | POA: Insufficient documentation

## 2021-08-31 DIAGNOSIS — Z79899 Other long term (current) drug therapy: Secondary | ICD-10-CM | POA: Insufficient documentation

## 2021-08-31 DIAGNOSIS — F10220 Alcohol dependence with intoxication, uncomplicated: Secondary | ICD-10-CM

## 2021-08-31 DIAGNOSIS — Z20822 Contact with and (suspected) exposure to covid-19: Secondary | ICD-10-CM | POA: Insufficient documentation

## 2021-08-31 DIAGNOSIS — F250 Schizoaffective disorder, bipolar type: Secondary | ICD-10-CM | POA: Diagnosis not present

## 2021-08-31 LAB — CBC WITH DIFFERENTIAL/PLATELET
Abs Immature Granulocytes: 0.02 10*3/uL (ref 0.00–0.07)
Basophils Absolute: 0 10*3/uL (ref 0.0–0.1)
Basophils Relative: 1 %
Eosinophils Absolute: 0.2 10*3/uL (ref 0.0–0.5)
Eosinophils Relative: 3 %
HCT: 45.2 % (ref 36.0–46.0)
Hemoglobin: 15.2 g/dL — ABNORMAL HIGH (ref 12.0–15.0)
Immature Granulocytes: 0 %
Lymphocytes Relative: 35 %
Lymphs Abs: 1.8 10*3/uL (ref 0.7–4.0)
MCH: 29.6 pg (ref 26.0–34.0)
MCHC: 33.6 g/dL (ref 30.0–36.0)
MCV: 88.1 fL (ref 80.0–100.0)
Monocytes Absolute: 0.3 10*3/uL (ref 0.1–1.0)
Monocytes Relative: 5 %
Neutro Abs: 2.9 10*3/uL (ref 1.7–7.7)
Neutrophils Relative %: 56 %
Platelets: 273 10*3/uL (ref 150–400)
RBC: 5.13 MIL/uL — ABNORMAL HIGH (ref 3.87–5.11)
RDW: 13.7 % (ref 11.5–15.5)
WBC: 5.2 10*3/uL (ref 4.0–10.5)
nRBC: 0 % (ref 0.0–0.2)

## 2021-08-31 LAB — I-STAT BETA HCG BLOOD, ED (MC, WL, AP ONLY): I-stat hCG, quantitative: <5 m[IU]/mL (ref <5)

## 2021-08-31 LAB — URINALYSIS, ROUTINE W REFLEX MICROSCOPIC
Bilirubin Urine: NEGATIVE
Glucose, UA: NEGATIVE mg/dL
Hgb urine dipstick: NEGATIVE
Ketones, ur: NEGATIVE mg/dL
Leukocytes,Ua: NEGATIVE
Nitrite: NEGATIVE
Protein, ur: NEGATIVE mg/dL
Specific Gravity, Urine: 1.001 — ABNORMAL LOW (ref 1.005–1.030)
pH: 6 (ref 5.0–8.0)

## 2021-08-31 LAB — COMPREHENSIVE METABOLIC PANEL
ALT: 16 U/L (ref 0–44)
AST: 19 U/L (ref 15–41)
Albumin: 4.3 g/dL (ref 3.5–5.0)
Alkaline Phosphatase: 52 U/L (ref 38–126)
Anion gap: 12 (ref 5–15)
BUN: 10 mg/dL (ref 6–20)
CO2: 21 mmol/L — ABNORMAL LOW (ref 22–32)
Calcium: 9.3 mg/dL (ref 8.9–10.3)
Chloride: 109 mmol/L (ref 98–111)
Creatinine, Ser: 0.75 mg/dL (ref 0.44–1.00)
GFR, Estimated: >60 mL/min (ref >60)
Glucose, Bld: 97 mg/dL (ref 70–99)
Potassium: 4 mmol/L (ref 3.5–5.1)
Sodium: 142 mmol/L (ref 135–145)
Total Bilirubin: 0.5 mg/dL (ref 0.3–1.2)
Total Protein: 7.3 g/dL (ref 6.5–8.1)

## 2021-08-31 LAB — RESP PANEL BY RT-PCR (FLU A&B, COVID) ARPGX2
Influenza A by PCR: NEGATIVE
Influenza B by PCR: NEGATIVE
SARS Coronavirus 2 by RT PCR: NEGATIVE

## 2021-08-31 LAB — PREGNANCY, URINE: Preg Test, Ur: NEGATIVE

## 2021-08-31 LAB — RAPID URINE DRUG SCREEN, HOSP PERFORMED
Amphetamines: NOT DETECTED
Barbiturates: NOT DETECTED
Benzodiazepines: NOT DETECTED
Cocaine: NOT DETECTED
Opiates: NOT DETECTED
Tetrahydrocannabinol: NOT DETECTED

## 2021-08-31 LAB — ETHANOL: Alcohol, Ethyl (B): 254 mg/dL — ABNORMAL HIGH (ref <10)

## 2021-08-31 LAB — ACETAMINOPHEN LEVEL: Acetaminophen (Tylenol), Serum: <10 ug/mL — ABNORMAL LOW (ref 10–30)

## 2021-08-31 LAB — SALICYLATE LEVEL: Salicylate Lvl: <7 mg/dL — ABNORMAL LOW (ref 7.0–30.0)

## 2021-08-31 MED ORDER — LORAZEPAM 1 MG PO TABS
0.0000 mg | ORAL_TABLET | Freq: Four times a day (QID) | ORAL | Status: DC
  Administered 2021-08-31 – 2021-09-01 (×2): 2 mg via ORAL
  Administered 2021-09-01: 03:00:00 1 mg via ORAL
  Filled 2021-08-31 (×3): qty 2

## 2021-08-31 MED ORDER — THIAMINE HCL 100 MG/ML IJ SOLN: 100.0000 mg | Freq: Every day | INTRAMUSCULAR | Status: DC

## 2021-08-31 MED ORDER — FOLIC ACID 1 MG PO TABS
1.0000 mg | ORAL_TABLET | Freq: Every day | ORAL | Status: DC
  Administered 2021-08-31 – 2021-09-01 (×2): 1 mg via ORAL
  Filled 2021-08-31 (×2): qty 1

## 2021-08-31 MED ORDER — LORAZEPAM 2 MG/ML IJ SOLN: 1.0000 mg | INTRAMUSCULAR | Status: DC | PRN

## 2021-08-31 MED ORDER — ADULT MULTIVITAMIN W/MINERALS CH
1.0000 | ORAL_TABLET | Freq: Every day | ORAL | Status: DC
  Administered 2021-08-31 – 2021-09-01 (×2): 1 via ORAL
  Filled 2021-08-31 (×2): qty 1

## 2021-08-31 MED ORDER — LORAZEPAM 1 MG PO TABS: 0.0000 mg | ORAL_TABLET | Freq: Two times a day (BID) | ORAL | Status: DC

## 2021-08-31 MED ORDER — LORAZEPAM 1 MG PO TABS
1.0000 mg | ORAL_TABLET | ORAL | Status: DC | PRN
Start: 2021-08-31 — End: 2021-09-01

## 2021-08-31 MED ORDER — THIAMINE HCL 100 MG PO TABS
100.0000 mg | ORAL_TABLET | Freq: Every day | ORAL | Status: DC
  Administered 2021-08-31 – 2021-09-01 (×2): 100 mg via ORAL
  Filled 2021-08-31 (×2): qty 1

## 2021-08-31 NOTE — ED Triage Notes (Signed)
To ED via [facility] - IVC. Per IVC papers pt has ptsd, schizoeffective disorder and not taking her meds. Very agitated in triage

## 2021-08-31 NOTE — ED Provider Notes (Signed)
Emergency Medicine Provider Triage Evaluation Note  Stacy Poole , a 30 y.o. female  was evaluated in triage.  Pt presents with [employer] crisis team already under involuntary commitment  for acute psychosis.  History of schizoaffective disorder, reportedly noncompliant with her medications, polysubstance abuse.  Patient agitated and tangential in her thinking in the triage area.  Nonviolent.  Review of Systems  Positive:  Negative:  Unable to complete ROS due to patient's acute psychosis Physical Exam  There were no vitals taken for this visit. Gen:   Awake, agitated Resp:  Normal effort  MSK:   Moves extremities without difficulty  Other:  Tachycardic with regular rhythm at time of my evaluation, lungs CTAB.  Abdomen soft, nontender, nontender.  Patient tangential in her thinking, paranoid, delusional, appears to be responding to internal stimuli.  Medical Decision Making  Medically screening exam initiated at 2:27 PM.  Appropriate orders placed.  CHELSEA PEDRETTI was informed that the remainder of the evaluation will be completed by another provider, this initial triage assessment does not replace that evaluation, and the importance of remaining in the ED until their evaluation is complete.  Patient clinically is acutely psychotic.  She presents already under IVC.  Medical clearance labs ordered.  [place] [facility] crisis team remains at the bedside in the emergency department.  This chart was dictated using voice recognition software, Dragon. Despite the best efforts of this provider to proofread and correct errors, errors may still occur which can change documentation meaning.    Paris Lore, PA-C 08/31/21 1437    Franne Forts, DO 08/31/21 2121

## 2021-08-31 NOTE — ED Notes (Signed)
Pt requesting a pregnancy test, EDP notified.

## 2021-08-31 NOTE — Progress Notes (Signed)
Inpatient Behavioral Health Placement  Pt meets inpatient criteria per Nira Conn, NP. There are no appropriate beds at [facility] Fransico Michael, RN.  Referral was sent to the following facilities;    Destination Service Provider Address Phone Fax  [facility]  [address], [place] [address] [phone]  [facility]  [address] [place] [address] [phone]  [facility] [address]  [address], [place] [address] [phone]  [facility]  [address] [place], [place] [address] [phone]  [facility]  [address], [place] [address] [phone]  [facility]  [address] [place] [address] [phone]  [facility]  [address] [place] [address] [phone]  [facility]  [address] [place] [address] [phone]  [facility]  [address] [place] [address] [phone]  [facility]  [address], [place] [address] [phone]  [facility]  [address], [place] [address] [phone]  [facility]  [address], [place] [address] [phone]  [facility]  [address] [place] [address] [phone]  [facility]  [address], [place] [address] [phone]  [facility]  [address] [place] [address] [phone]  [facility]  [address] [place] [address] [phone]  [facility]  [address] [place], [place]  [address] [phone]  [facility]       Situation ongoing,  CSW will follow up.   Maryjean Ka, MSW, [facility] 08/31/2021  @ 11:55 PM

## 2021-08-31 NOTE — ED Notes (Signed)
TTS called to ask for assistance seeing this pt. Pt directed to consult room A and TTS monitor placed in room.  [facility] called to inform counselor she is ready. Spoke with Alfonzo Feller who stated he would let her know.

## 2021-08-31 NOTE — ED Notes (Signed)
Pt continuously asking to watch [employer]
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
## 2021-08-31 NOTE — ED Provider Notes (Signed)
[facility] EMERGENCY DEPARTMENT Provider Note   CSN: [id number] Arrival date & time: 08/31/21  1328     History Chief Complaint  Patient presents with   Alcohol Problem    Stacy Poole is a 30 y.o. female with a past medical history of alcohol use disorder presenting today under IVC by [employer].  She was reportedly found intoxicated and hostile and under the influence.  Patient denies SI/HI at this time.  No AVH.  Reports that she had 12 large 40s today, which is around her baseline.  Reports that she drinks what ever she can get her hands on, to include previous episodes of drinking hand sanitizer.  She reports that she has been under large amounts of stress due to the loss of 2 friends as well as world relations.  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  Reporting that she feels as though her hands are shaking, she is irritable and having "seizure-like activity in her head."  These are frequent feelings that she gets during alcohol withdrawal.     Past Medical History:  Diagnosis Date   Acute ear infection    Anxiety    Arachnoid cyst    Fifth disease    Insomnia    Mental disorder    Mononucleosis    PTSD (post-traumatic stress disorder)    Substance abuse ([place])     Patient Active Problem List   Diagnosis Date Noted   PTSD (post-traumatic stress disorder) 06/25/2021   Bipolar affective disorder, currently manic, severe, with psychotic features ([place]) 06/22/2021   Suicidal ideation    Schizoaffective disorder ([place]) 10/14/2019   Schizophrenia ([place]) 03/27/2019   Alcohol use disorder, severe, dependence ([place]) 03/05/2019   Alcohol abuse with alcohol-induced psychotic disorder, with delusions ([place]) 02/22/2019   Alcohol abuse    Psychosis ([place])    Schizoaffective disorder, bipolar type ([place]) 05/06/2018   Tobacco use disorder 05/06/2018   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 No pertinent surgical history.   OB History   No obstetric history on file.     No family history on file.  Social History   Tobacco Use   Smoking status: Former    Packs/day: 0.50    Years: 10.00    Pack years: 5.00    Types: Cigarettes   Smokeless tobacco: Never  Vaping Use   Vaping Use: Never used  Substance Use Topics   Alcohol use: Not Currently    Comment: socially   Drug use: Not Currently    Comment: pt denies, + cannabis    Home Medications Prior to Admission medications   Medication Sig Start Date End Date Taking? Authorizing Provider  carbamazepine (TEGRETOL XR) 200 MG 12 hr tablet Take 1 tablet (200 mg total) by mouth 2 (two) times daily. 07/05/21 08/31/21 Yes France Ravens, MD  cloNIDine (CATAPRES) 0.1 MG tablet Take 1 tablet (0.1 mg total) by mouth 2 (two) times daily. 07/04/21 08/31/21 Yes France Ravens, MD  hydrOXYzine (ATARAX/VISTARIL) 25 MG tablet Take 1 tablet (25 mg total) by mouth 3 (three) times daily as needed for anxiety. 07/04/21  Yes France Ravens, MD  OLANZapine (ZYPREXA) 10 MG tablet Take 1 tablet (10 mg total) by mouth daily. 07/05/21 08/31/21 Yes France Ravens, MD  traZODone (DESYREL) 100 MG tablet Take  1 tablet (100 mg total) by mouth at bedtime as needed for sleep. 07/04/21 08/31/21 Yes France Ravens, MD  famotidine (PEPCID) 20 MG tablet Take 1 tablet (20 mg total) by mouth daily. Patient not taking: Reported on 08/31/2021 07/05/21 08/04/21  France Ravens, MD  loratadine (CLARITIN) 10 MG tablet Take 1 tablet (10 mg total) by mouth daily. Patient not taking: Reported on 08/31/2021 07/05/21 08/04/21  France Ravens, MD  OLANZapine (ZYPREXA) 20 MG tablet Take 1 tablet (20 mg total) by mouth at bedtime. Patient not taking: Reported on 08/31/2021 07/04/21 08/03/21  France Ravens, MD    Allergies    Lamictal  [lamotrigine], Pork-derived products, and Tetracyclines & related  Review of Systems   Review of Systems  Neurological:  Positive for tremors.  Psychiatric/Behavioral:  Positive for agitation. Negative for hallucinations and suicidal ideas. The patient is nervous/anxious.   All other systems reviewed and are negative.  Physical Exam Updated Vital Signs BP (!) 124/99 (BP Location: Right Arm)    Pulse 96    Temp 98.5 F (36.9 C) (Oral)    Resp 20    SpO2 97%   Physical Exam Vitals and nursing note reviewed.  Constitutional:      Appearance: Normal appearance.  HENT:     Head: Normocephalic and atraumatic.  Eyes:     General: No scleral icterus.    Conjunctiva/sclera: Conjunctivae normal.  Pulmonary:     Effort: Pulmonary effort is normal. No respiratory distress.  Skin:    Findings: No rash.  Neurological:     Mental Status: She is alert.  Psychiatric:        Mood and Affect: Mood normal.    ED Results / Procedures / Treatments   Labs (all labs ordered are listed, but only abnormal results are displayed) Labs Reviewed  COMPREHENSIVE METABOLIC PANEL - Abnormal; Notable for the following components:      Result Value   CO2 21 (*)    All other components within normal limits  ETHANOL - Abnormal; Notable for the following components:   Alcohol, Ethyl (B) 254 (*)    All other components within normal limits  CBC WITH DIFFERENTIAL/PLATELET - Abnormal; Notable for the following components:   RBC 5.13 (*)    Hemoglobin 15.2 (*)    All other components within normal limits  SALICYLATE LEVEL - Abnormal; Notable for the following components:   Salicylate Lvl [id number] (*)    All other components within normal limits  ACETAMINOPHEN LEVEL - Abnormal; Notable for the following components:   Acetaminophen (Tylenol), Serum <10 (*)    All other components within normal limits  URINALYSIS, ROUTINE W REFLEX MICROSCOPIC - Abnormal; Notable for the following components:   Color, Urine  COLORLESS (*)    Specific Gravity, Urine 1.001 (*)    All other components within normal limits  RESP PANEL BY RT-PCR (FLU A&B, COVID) ARPGX2  RAPID URINE DRUG SCREEN, HOSP PERFORMED  I-STAT BETA HCG BLOOD, ED ([facility], [facility], [facility] ONLY)    EKG None  Radiology No results found.  Procedures Procedures   Medications Ordered in ED Medications  LORazepam (ATIVAN) tablet 1-4 mg (has no administration in time range)    Or  LORazepam (ATIVAN) injection 1-4 mg (has no administration in time range)  thiamine tablet 100 mg (has no administration in time range)    Or  thiamine (B-1) injection 100 mg (has no administration in time range)  folic acid (FOLVITE) tablet 1 mg (has no administration in time  range)  multivitamin with minerals tablet 1 tablet (has no administration in time range)  LORazepam (ATIVAN) tablet 0-4 mg (has no administration in time range)    Followed by  LORazepam (ATIVAN) tablet 0-4 mg (has no administration in time range)    ED Course  I have reviewed the triage vital signs and the nursing notes.  Pertinent labs & imaging results that were available during my care of the patient were reviewed by me and considered in my medical decision making (see chart for details).    MDM Rules/Calculators/A&P 30 year old female with a past history of schizoaffective disorder and PTSD presenting today under IVC per [employer].  Patient was originally intoxicated with an ethanol of 254, however she is clinically sober at this time.  She denies any SI/HI or AVH.  Is concerned that she may go into alcohol withdrawals as she has in the past.  Reports she has been drinking since she was a teenager.  She denies any current drug use.  At this time I believe the patient to be medically clear.  CIWA precautions are in place in case the patient goes into alcohol withdrawals.  TTS complete, awaiting recommendation.  Final Clinical Impression(s) / ED Diagnoses Final diagnoses:  Alcohol dependence with  uncomplicated intoxication (HCC)    Rx / DC Orders TTS recommending inpatient evaluation. First exam complete and patient will be admitted with IVC.   Saddie Benders, PA-C 08/31/21 2302    Charlynne Pander, MD 09/02/21 [phone]

## 2021-09-01 ENCOUNTER — Other Ambulatory Visit: Payer: Self-pay

## 2021-09-01 ENCOUNTER — Encounter (HOSPITAL_COMMUNITY): Payer: Self-pay | Admitting: Nurse Practitioner

## 2021-09-01 ENCOUNTER — Inpatient Hospital Stay (HOSPITAL_COMMUNITY)
Admission: AD | Admit: 2021-09-01 | Discharge: 2021-09-05 | DRG: 885 | Disposition: A | Discharge status: Home / Self Care | Payer: BC Managed Care – PPO | Source: Intra-hospital | Attending: Psychiatry | Admitting: Psychiatry

## 2021-09-01 DIAGNOSIS — F10220 Alcohol dependence with intoxication, uncomplicated: Secondary | ICD-10-CM | POA: Diagnosis present

## 2021-09-01 DIAGNOSIS — F10239 Alcohol dependence with withdrawal, unspecified: Secondary | ICD-10-CM | POA: Diagnosis present

## 2021-09-01 DIAGNOSIS — Z23 Encounter for immunization: Secondary | ICD-10-CM | POA: Diagnosis not present

## 2021-09-01 DIAGNOSIS — Z87891 Personal history of nicotine dependence: Secondary | ICD-10-CM | POA: Diagnosis not present

## 2021-09-01 DIAGNOSIS — F4310 Post-traumatic stress disorder, unspecified: Secondary | ICD-10-CM | POA: Diagnosis present

## 2021-09-01 DIAGNOSIS — F250 Schizoaffective disorder, bipolar type: Secondary | ICD-10-CM | POA: Diagnosis present

## 2021-09-01 DIAGNOSIS — Z91018 Allergy to other foods: Secondary | ICD-10-CM

## 2021-09-01 DIAGNOSIS — F419 Anxiety disorder, unspecified: Secondary | ICD-10-CM | POA: Diagnosis present

## 2021-09-01 DIAGNOSIS — Z881 Allergy status to other antibiotic agents status: Secondary | ICD-10-CM | POA: Diagnosis not present

## 2021-09-01 DIAGNOSIS — F10230 Alcohol dependence with withdrawal, uncomplicated: Secondary | ICD-10-CM | POA: Diagnosis present

## 2021-09-01 DIAGNOSIS — E559 Vitamin D deficiency, unspecified: Secondary | ICD-10-CM | POA: Diagnosis present

## 2021-09-01 DIAGNOSIS — F1020 Alcohol dependence, uncomplicated: Secondary | ICD-10-CM | POA: Diagnosis present

## 2021-09-01 DIAGNOSIS — Z888 Allergy status to other drugs, medicaments and biological substances status: Secondary | ICD-10-CM | POA: Diagnosis not present

## 2021-09-01 DIAGNOSIS — Y908 Blood alcohol level of 240 mg/100 ml or more: Secondary | ICD-10-CM | POA: Diagnosis present

## 2021-09-01 DIAGNOSIS — Z20822 Contact with and (suspected) exposure to covid-19: Secondary | ICD-10-CM | POA: Diagnosis present

## 2021-09-01 MED ORDER — ADULT MULTIVITAMIN W/MINERALS CH
1.0000 | ORAL_TABLET | Freq: Every day | ORAL | Status: DC
  Filled 2021-09-01 (×3): qty 1

## 2021-09-01 MED ORDER — TRAZODONE HCL 50 MG PO TABS
100.0000 mg | ORAL_TABLET | Freq: Every evening | ORAL | Status: DC | PRN
  Administered 2021-09-01: 03:00:00 100 mg via ORAL
  Filled 2021-09-01: qty 2

## 2021-09-01 MED ORDER — CARBAMAZEPINE ER 200 MG PO TB12
200.0000 mg | ORAL_TABLET | Freq: Two times a day (BID) | ORAL | Status: DC
  Filled 2021-09-01 (×3): qty 1

## 2021-09-01 MED ORDER — OLANZAPINE 10 MG PO TABS
10.0000 mg | ORAL_TABLET | Freq: Every day | ORAL | Status: DC
  Administered 2021-09-01: 09:00:00 10 mg via ORAL
  Filled 2021-09-01 (×2): qty 1

## 2021-09-01 MED ORDER — THIAMINE HCL 100 MG PO TABS
100.0000 mg | ORAL_TABLET | Freq: Every day | ORAL | Status: DC
  Filled 2021-09-01 (×2): qty 1

## 2021-09-01 MED ORDER — OLANZAPINE 10 MG PO TABS
10.0000 mg | ORAL_TABLET | Freq: Every day | ORAL | Status: DC
  Filled 2021-09-01 (×3): qty 1

## 2021-09-01 MED ORDER — HYDROXYZINE HCL 25 MG PO TABS
25.0000 mg | ORAL_TABLET | Freq: Three times a day (TID) | ORAL | Status: DC | PRN
  Administered 2021-09-01 – 2021-09-02 (×4): 25 mg via ORAL
  Filled 2021-09-01 (×5): qty 1

## 2021-09-01 MED ORDER — CLONIDINE HCL 0.1 MG PO TABS
0.1000 mg | ORAL_TABLET | Freq: Two times a day (BID) | ORAL | Status: DC
  Administered 2021-09-01: 09:00:00 0.1 mg via ORAL
  Filled 2021-09-01: qty 1

## 2021-09-01 MED ORDER — RENA-VITE PO TABS
1.0000 | ORAL_TABLET | Freq: Every day | ORAL | Status: DC
  Administered 2021-09-02 – 2021-09-04 (×3): 1 via ORAL
  Filled 2021-09-01 (×4): qty 1

## 2021-09-01 MED ORDER — OLANZAPINE 5 MG PO TABS
5.0000 mg | ORAL_TABLET | Freq: Every day | ORAL | Status: DC
  Administered 2021-09-01 – 2021-09-04 (×4): 5 mg via ORAL
  Filled 2021-09-01 (×5): qty 1

## 2021-09-01 MED ORDER — MAGNESIUM HYDROXIDE 400 MG/5ML PO SUSP
30.0000 mL | Freq: Every day | ORAL | Status: DC | PRN
  Administered 2021-09-02: 09:00:00 30 mL via ORAL
  Filled 2021-09-01: qty 30

## 2021-09-01 MED ORDER — OLANZAPINE 10 MG IM SOLR: 5.0000 mg | Freq: Four times a day (QID) | INTRAMUSCULAR | Status: DC | PRN

## 2021-09-01 MED ORDER — LORAZEPAM 1 MG PO TABS: 1.0000 mg | ORAL_TABLET | Freq: Four times a day (QID) | ORAL | Status: AC | PRN

## 2021-09-01 MED ORDER — ALUM & MAG HYDROXIDE-SIMETH 200-200-20 MG/5ML PO SUSP
30.0000 mL | ORAL | Status: DC | PRN
  Administered 2021-09-03: 30 mL via ORAL
  Filled 2021-09-01: qty 30

## 2021-09-01 MED ORDER — HYDROXYZINE HCL 25 MG PO TABS
25.0000 mg | ORAL_TABLET | Freq: Three times a day (TID) | ORAL | Status: DC | PRN
  Administered 2021-09-01: 09:00:00 25 mg via ORAL
  Filled 2021-09-01: qty 1

## 2021-09-01 MED ORDER — FAMOTIDINE 20 MG PO TABS
20.0000 mg | ORAL_TABLET | Freq: Every day | ORAL | Status: DC
  Administered 2021-09-01: 09:00:00 20 mg via ORAL
  Filled 2021-09-01: qty 1

## 2021-09-01 MED ORDER — WHITE PETROLATUM EX OINT
TOPICAL_OINTMENT | CUTANEOUS | Status: AC
  Administered 2021-09-01: 2
  Filled 2021-09-01: qty 5

## 2021-09-01 MED ORDER — TRAZODONE HCL 100 MG PO TABS
100.0000 mg | ORAL_TABLET | Freq: Every evening | ORAL | Status: DC | PRN
  Administered 2021-09-01 – 2021-09-03 (×3): 100 mg via ORAL
  Filled 2021-09-01 (×3): qty 1

## 2021-09-01 MED ORDER — INFLUENZA VAC SPLIT QUAD 0.5 ML IM SUSY
0.5000 mL | PREFILLED_SYRINGE | INTRAMUSCULAR | Status: AC
  Administered 2021-09-02: 10:00:00 0.5 mL via INTRAMUSCULAR
  Filled 2021-09-01: qty 0.5

## 2021-09-01 MED ORDER — FOLIC ACID 1 MG PO TABS
1.0000 mg | ORAL_TABLET | Freq: Every day | ORAL | Status: DC
  Administered 2021-09-02 – 2021-09-05 (×4): 1 mg via ORAL
  Filled 2021-09-01 (×6): qty 1

## 2021-09-01 MED ORDER — CARBAMAZEPINE ER 200 MG PO TB12
200.0000 mg | ORAL_TABLET | Freq: Two times a day (BID) | ORAL | Status: DC
  Administered 2021-09-01 – 2021-09-05 (×8): 200 mg via ORAL
  Filled 2021-09-01 (×10): qty 1

## 2021-09-01 MED ORDER — CLONIDINE HCL 0.1 MG PO TABS
0.1000 mg | ORAL_TABLET | Freq: Two times a day (BID) | ORAL | Status: DC
  Administered 2021-09-01 – 2021-09-05 (×8): 0.1 mg via ORAL
  Filled 2021-09-01 (×10): qty 1

## 2021-09-01 MED ORDER — ONDANSETRON 4 MG PO TBDP: 4.0000 mg | ORAL_TABLET | Freq: Four times a day (QID) | ORAL | Status: AC | PRN

## 2021-09-01 MED ORDER — ACETAMINOPHEN 325 MG PO TABS
650.0000 mg | ORAL_TABLET | Freq: Four times a day (QID) | ORAL | Status: DC | PRN
  Administered 2021-09-03 – 2021-09-05 (×5): 650 mg via ORAL
  Filled 2021-09-01 (×5): qty 2

## 2021-09-01 MED ORDER — OLANZAPINE 5 MG PO TBDP
5.0000 mg | ORAL_TABLET | Freq: Four times a day (QID) | ORAL | Status: DC | PRN
  Administered 2021-09-01: 17:00:00 5 mg via ORAL
  Filled 2021-09-01: qty 1

## 2021-09-01 MED ORDER — FAMOTIDINE 20 MG PO TABS
20.0000 mg | ORAL_TABLET | Freq: Every day | ORAL | Status: DC
  Administered 2021-09-02 – 2021-09-05 (×4): 20 mg via ORAL
  Filled 2021-09-01 (×6): qty 1

## 2021-09-01 MED ORDER — LOPERAMIDE HCL 2 MG PO CAPS: 2.0000 mg | ORAL_CAPSULE | ORAL | Status: AC | PRN

## 2021-09-01 NOTE — Tx Team (Signed)
Initial Treatment Plan 09/01/2021 2:14 PM Stacy Poole [id number]    PATIENT STRESSORS: Financial difficulties   Medication change or noncompliance   Substance abuse     PATIENT STRENGTHS: Capable of independent living  Personnel officer means  Religious Affiliation  Supportive family/friends  Work skills    PATIENT IDENTIFIED PROBLEMS: Substance Abuse (Cocaine, [place] 8, CBD) "I used cocaine 2 weeks ago, BAL 254 on admission".    Medication noncompliance "I stopped taking my medications 2-3 weeks ago".    Financial constraints             DISCHARGE CRITERIA:  Improved stabilization in mood, thinking, and/or behavior Verbal commitment to aftercare and medication compliance Withdrawal symptoms are absent or subacute and managed without 24-hour nursing intervention  PRELIMINARY DISCHARGE PLAN: Outpatient therapy Return to previous living arrangement Return to previous work or school arrangements  PATIENT/FAMILY INVOLVEMENT: This treatment plan has been presented to and reviewed with the patient, Stacy Poole.The patient  have been given the opportunity to ask questions and make suggestions.  Sherryl Manges, RN 09/01/2021, 2:14 PM

## 2021-09-01 NOTE — Progress Notes (Signed)
Informed TTS Debbe Odea and provider Selena Batten that [facility] is accepting this patient to room 507-1. Accepting is Barbara Cower and attending is Dr Mason Jim. Pt can come today after 0900. Please call report to [phone]

## 2021-09-01 NOTE — ED Notes (Signed)
This RN received call from Danella Sensing Academic librarian from [facility]) stating that there is a possible bed placement for this pt. IVC paperwork faxed to facility. Report can be given to Yvetta Coder RN anytime after 9am on 12/29 at [phone]

## 2021-09-01 NOTE — BHH Group Notes (Signed)
Adult Psychoeducational Group Note  Date:  09/01/2021 Time:  10:44 PM  Group Topic/Focus:  Goals Group:   The focus of this group is to help patients establish daily goals to achieve during treatment and discuss how the patient can incorporate goal setting into their daily lives to aide in recovery.  Participation Level:  Active  Participation Quality:  Appropriate  Affect:  Appropriate  Cognitive:  Appropriate  Insight: Good  Engagement in Group:  Engaged  Modes of Intervention:  Discussion  Additional Comments Jacalyn Lefevre 09/01/2021, 10:44 PM

## 2021-09-01 NOTE — BH Assessment (Signed)
Received call from [facility].  Spoke with Osvaldo Shipper at [phone].  Patient accepted to [facility].  Disposition SW please return phone call.

## 2021-09-01 NOTE — BHH Suicide Risk Assessment (Signed)
B[facility] INPATIENT:  Family/Significant Other Suicide Prevention Education  Suicide Prevention Education:  Patient Refusal for Family/Significant Other Suicide Prevention Education: The patient Stacy Poole has refused to provide written consent for family/significant other to be provided Family/Significant Other Suicide Prevention Education during admission and/or prior to discharge.  Physician notified.  Hazel Wrinkle A Micheala Morissette 09/01/2021, 3:19 PM

## 2021-09-01 NOTE — BHH Suicide Risk Assessment (Signed)
[facility] Admission Suicide Risk Assessment   Nursing information obtained from:  Patient Demographic factors:  Caucasian, Low socioeconomic status, Living alone Current Mental Status:  NA Loss Factors:  Financial problems / change in socioeconomic status Historical Factors:  Impulsivity, Family history of mental illness or substance abuse Risk Reduction Factors:  Sense of responsibility to family, Employed, Positive social support  Total Time spent with patient: 45 minutes Principal Problem: Schizoaffective disorder, bipolar type ([place]) Diagnosis:  Principal Problem:   Schizoaffective disorder, bipolar type ([place]) Active Problems:   Alcohol use disorder, moderate, dependence ([place])   PTSD (post-traumatic stress disorder)  Subjective Data: Stacy Poole is a 30 YO F with a history of schizoaffective disorder and substance use presents with [place] and complaints of several weeks of excessive alcohol use following news of the loss of a friend. She feels that she was being reckless with her use and "playing [place] roulette with your health". She has not been taking her antipsychotic or mood stabilizer and does not intend to restart them. She does not feel that she is currently suicidal or homicidal. She denies hallucinations. She does not think she is delusional or paranoid. She came here because she wants "strictly detox".   Continued Clinical Symptoms:  Alcohol Use Disorder Identification Test Final Score (AUDIT): 27 The "Alcohol Use Disorders Identification Test", Guidelines for Use in Primary Care, Second Edition.  World Pharmacologist [facility]). Score between 0-7:  no or low risk or alcohol related problems. Score between 8-15:  moderate risk of alcohol related problems. Score between 16-19:  high risk of alcohol related problems. Score 20 or above:  warrants further diagnostic evaluation for alcohol dependence and treatment.   CLINICAL FACTORS:   Bipolar Disorder:   Depressive  phase Alcohol/Substance Abuse/Dependencies Schizophrenia:   Less than 29 years old More than one psychiatric diagnosis Previous Psychiatric Diagnoses and Treatments   Musculoskeletal: Strength & Muscle Tone: within normal limits Gait & Station: normal Patient leans: N/A  Psychiatric Specialty Exam:  Presentation  General Appearance: Disheveled  Eye Contact:Fleeting  Speech:Normal Rate  Speech Volume:Normal  Handedness:Right   Mood and Affect  Mood:Depressed; Irritable  Affect:Blunt   Thought Process  Thought Processes:Coherent  Descriptions of Associations:Intact  Orientation:Full (Time, Place and Person)  Thought Content:Rumination  History of Schizophrenia/Schizoaffective disorder:Yes  Duration of Psychotic Symptoms:Less than six months  Hallucinations:Hallucinations: None  Ideas of Reference:None  Suicidal Thoughts:Suicidal Thoughts: No  Homicidal Thoughts:Homicidal Thoughts: No   Sensorium  Memory:Immediate Fair  Judgment:Poor  Insight:Poor   Executive Functions  Concentration:Fair  Attention Span:Poor  Recall:Poor  Fund of Knowledge:Fair  Language:Fair   Psychomotor Activity  Psychomotor Activity:Psychomotor Activity: Decreased; Restlessness   Assets  Assets:Talents/Skills; Communication Skills; Housing   Sleep  Sleep:Sleep: Fair    Physical Exam: Physical Exam Vitals and nursing note reviewed.  HENT:     Head: Normocephalic.     Nose: Nose normal.  Eyes:     Extraocular Movements: Extraocular movements intact.  Pulmonary:     Effort: Pulmonary effort is normal.  Musculoskeletal:        General: Normal range of motion.     Cervical back: Normal range of motion.  Neurological:     General: No focal deficit present.     Mental Status: She is alert and oriented to person, place, and time.  Psychiatric:        Attention and Perception: Attention normal. She does not perceive auditory or visual hallucinations.         Mood  and Affect: Mood is depressed. Affect is blunt.        Speech: Speech normal.        Behavior: Behavior normal. Behavior is cooperative.        Thought Content: Thought content is not paranoid or delusional. Thought content does not include homicidal or suicidal ideation.        Cognition and Memory: Cognition and memory normal.        Judgment: Judgment is impulsive.   Review of Systems  Constitutional:  Positive for malaise/fatigue.  HENT:  Negative for hearing loss.   Respiratory:  Negative for cough.   Cardiovascular:  Negative for chest pain.  Gastrointestinal:  Negative for nausea and vomiting.  Neurological:  Negative for dizziness and headaches.  Psychiatric/Behavioral:  Positive for depression and substance abuse. Negative for hallucinations and suicidal ideas. The patient does not have insomnia.   Blood pressure 113/86, pulse 100, temperature 98 F (36.7 C), temperature source Oral, resp. rate 18, height 5\' 3"  (1.6 m), weight 73.9 kg, SpO2 100 %. Body mass index is 28.87 kg/m.   COGNITIVE FEATURES THAT CONTRIBUTE TO RISK:  Thought constriction (tunnel vision)    SUICIDE RISK:   Moderate:  Frequent suicidal ideation with limited intensity, and duration, some specificity in terms of plans, no associated intent, good self-control, limited dysphoria/symptomatology, some risk factors present, and identifiable protective factors, including available and accessible social support.  PLAN OF CARE:   Safety and Monitoring --  Admission to inpatient psychiatric unit for safety, stabilization and treatment -- Daily contact with patient to assess and evaluate symptoms and progress in treatment -- Patient's case to be discussed in multi-disciplinary team meeting. -- Patient will be encouraged to participate in the therapeutic group milieu. -- Observation Level : q15 minute checks -- Vital signs:  q12 hours -- Precautions: suicide.  Plan  -Monitor Vitals. -Monitor for  thoughts of harm to self or others -Monitor for psychosis, disorganization or changes to cognition -Monitor for withdrawal symptoms. -Monitor for medication side effects.  Labs/Studies: A1c, lipids  Medications: CIWA, but also restart home medications and encourage compliance   I certify that inpatient services furnished can reasonably be expected to improve the patient's condition.   , MD 09/01/2021, 3:12 PM

## 2021-09-01 NOTE — ED Notes (Signed)
Pt up taking shower before transport to [facility]. Pt has sitter at this time, sitter with pt.

## 2021-09-01 NOTE — H&P (Signed)
Psychiatric Admission Assessment Adult  Patient Identification: Stacy Poole MRN:  [id number] Date of Evaluation:  09/01/2021 Chief Complaint:  Schizophrenia (Stacy Poole) [Stacy Poole] Principal Diagnosis: Schizoaffective disorder, bipolar type (Stacy Poole) Diagnosis:  Principal Problem:   Schizoaffective disorder, bipolar type (Stacy Poole) Active Problems:   Alcohol use disorder, moderate, dependence (Stacy Poole)   PTSD (post-traumatic stress disorder)  History of Present Illness:  The patient was most recently discharged from [facility] on 07/05/21 on zyprexa and tegretol. She did not make all of her follow up due to issue with getting a ride from mother and work scheduling. She has been using alcohol daily until she passes out. She has previously had DUI that required court-ordered treatment, but this may be resolved.   Stacy Poole is a 30 YO F with a history of schizoaffective disorder and substance use presents with [facility] and complaints of several weeks of excessive alcohol use following news of the loss of a friend. She feels that she was being reckless with her use and "playing [place] roulette with your health". She has not been taking her antipsychotic or mood stabilizer and does not intend to restart them. She does not feel that she is currently suicidal or homicidal. She denies hallucinations. She does not think she is delusional or paranoid. She came here because she wants "strictly detox".    Associated Signs/Symptoms: Depression Symptoms:  fatigue, Duration of Depression Symptoms: No data recorded (Hypo) Manic Symptoms:  Impulsivity, Irritable Mood, Anxiety Symptoms:   na Psychotic Symptoms:   na PTSD Symptoms: Had a traumatic exposure:  multiple Hypervigilance:  Yes Hyperarousal:  Irritability/Anger Total Time spent with patient: 45 minutes  Past Psychiatric History: multiple inpatient stays at [facility], [place], [facility], [facility] [place], [place], previous partial program at [facility]. Outpatient at [facility], therapy at [facility]  Is the patient at risk to self? Yes.    Has the patient been a risk to self in the past 6 months? Yes.    Has the patient been a risk to self within the distant past? Yes.    Is the patient a risk to others? Yes.    Has the patient been a risk to others in the past 6 months? Yes.    Has the patient been a risk to others within the distant past? Yes.     Prior Inpatient Therapy:   Prior Outpatient Therapy:    Alcohol Screening: Patient refused Alcohol Screening Tool: Yes 1. How often do you have a drink containing alcohol?: 4 or more times a week 2. How many drinks containing alcohol do you have on a typical day when you are drinking?: 5 or 6 3. How often do you have six or more drinks on one occasion?: Weekly AUDIT-C Score: 9 4. How often during the last year have you found that you were not able to stop drinking once you had started?: Weekly 5. How often during the last year have you failed to do what was normally expected from you because of drinking?: Weekly 6. How often during the last year have you needed a first drink in the morning to get yourself going after a heavy drinking session?: Monthly 7. How often during the last year have you had a feeling of guilt of remorse after drinking?: Less than monthly 8. How often during the last year have you been unable to remember what happened the night before because you had been drinking?: Less than monthly 9. Have you  or someone else been injured as a result of your drinking?: Yes, during the last year 10. Has a relative or friend or a doctor or another health worker been concerned about your drinking or suggested you cut down?: Yes, during the last year Alcohol Use Disorder Identification Test Final Score (AUDIT): 27 Alcohol Brief Interventions/Follow-up: Alcohol education/Brief advice Substance Abuse History in the last 12 months:  Yes.   Consequences of  Substance Abuse: Legal Consequences:  DUI Previous Psychotropic Medications: Yes  Psychological Evaluations: Yes  Past Medical History:  Past Medical History:  Diagnosis Date   Acute ear infection    Anxiety    Arachnoid cyst    Fifth disease    Insomnia    Mental disorder    Mononucleosis    PTSD (post-traumatic stress disorder)    Substance abuse ([place])    History reviewed. No pertinent surgical history. Family History: History reviewed. No pertinent family history. Family Psychiatric  History: none reported Tobacco Screening:   Social History:  Social History   Substance and Sexual Activity  Alcohol Use Not Currently   Comment: socially     Social History   Substance and Sexual Activity  Drug Use Not Currently   Comment: pt denies, + cannabis    Additional Social History:                           Allergies:   Allergies  Allergen Reactions   Lamictal [Lamotrigine] Rash   Pork-Derived Products Other (See Comments)    Caused her to be hospitalized as a child - reaction unknown   Tetracyclines & Related Other (See Comments)    Ear popping, couldn't move neck/back, and had blind spots/double vision   Lab Results:  Results for orders placed or performed during the hospital encounter of 08/31/21 (from the past 48 hour(s))  Comprehensive metabolic panel     Status: Abnormal   Collection Time: 08/31/21  2:35 PM  Result Value Ref Range   Sodium 142 135 - 145 mmol/L   Potassium 4.0 3.5 - 5.1 mmol/L   Chloride 109 98 - 111 mmol/L   CO2 21 (L) 22 - 32 mmol/L   Glucose, Bld 97 70 - 99 mg/dL    Comment: Glucose reference range applies only to samples taken after fasting for at least 8 hours.   BUN 10 6 - 20 mg/dL   Creatinine, Ser 0.75 0.44 - 1.00 mg/dL   Calcium 9.3 8.9 - 10.3 mg/dL   Total Protein 7.3 6.5 - 8.1 g/dL   Albumin 4.3 3.5 - 5.0 g/dL   AST 19 15 - 41 U/L   ALT 16 0 - 44 U/L   Alkaline Phosphatase 52 38 - 126 U/L   Total Bilirubin 0.5 0.3 -  1.2 mg/dL   GFR, Estimated >60 >60 mL/min    Comment: (NOTE) Calculated using the CKD-EPI Creatinine Equation (2021)    Anion gap 12 5 - 15    Comment: Performed at [facility] [address] [place], [place] [address]  Ethanol     Status: Abnormal   Collection Time: 08/31/21  2:35 PM  Result Value Ref Range   Alcohol, Ethyl (B) 254 (H) <10 mg/dL    Comment: (NOTE) Lowest detectable limit for serum alcohol is 10 mg/dL.  For medical purposes only. Performed at [facility], [place] [address] [place], [place] [address]   Urine rapid drug screen (hosp performed)  Status: None   Collection Time: 08/31/21  2:35 PM  Result Value Ref Range   Opiates NONE DETECTED NONE DETECTED   Cocaine NONE DETECTED NONE DETECTED   Benzodiazepines NONE DETECTED NONE DETECTED   Amphetamines NONE DETECTED NONE DETECTED   Tetrahydrocannabinol NONE DETECTED NONE DETECTED   Barbiturates NONE DETECTED NONE DETECTED    Comment: (NOTE) DRUG SCREEN FOR MEDICAL PURPOSES ONLY.  IF CONFIRMATION IS NEEDED FOR ANY PURPOSE, NOTIFY LAB WITHIN 5 DAYS.  LOWEST DETECTABLE LIMITS FOR URINE DRUG SCREEN Drug Class                     Cutoff (ng/mL) Amphetamine and metabolites    1000 Barbiturate and metabolites    200 Benzodiazepine                 [id number] Tricyclics and metabolites     300 Opiates and metabolites        300 Cocaine and metabolites        300 THC                            50 Performed at [facility], [place] [address], [place], [place] [address]   CBC with Diff     Status: Abnormal   Collection Time: 08/31/21  2:35 PM  Result Value Ref Range   WBC 5.2 4.0 - 10.5 K/uL   RBC 5.13 (H) 3.87 - 5.11 MIL/uL   Hemoglobin 15.2 (H) 12.0 - 15.0 g/dL   HCT 45.2 36.0 - 46.0 %   MCV 88.1 80.0 - 100.0 fL   MCH 29.6 26.0 - 34.0 pg   MCHC 33.6 30.0 - 36.0 g/dL   RDW 13.7 11.5 - 15.5 %   Platelets 273 150 - 400 K/uL   nRBC 0.0 0.0 - 0.2 %   Neutrophils Relative % 56 %   Neutro Abs 2.9  1.7 - 7.7 K/uL   Lymphocytes Relative 35 %   Lymphs Abs 1.8 0.7 - 4.0 K/uL   Monocytes Relative 5 %   Monocytes Absolute 0.3 0.1 - 1.0 K/uL   Eosinophils Relative 3 %   Eosinophils Absolute 0.2 0.0 - 0.5 K/uL   Basophils Relative 1 %   Basophils Absolute 0.0 0.0 - 0.1 K/uL   Immature Granulocytes 0 %   Abs Immature Granulocytes 0.02 0.00 - 0.07 K/uL    Comment: Performed at [facility], [address], [place], [place] [id number]  Salicylate level     Status: Abnormal   Collection Time: 08/31/21  2:35 PM  Result Value Ref Range   Salicylate Lvl [id number] (L) 7.0 - 30.0 mg/dL    Comment: Performed at [facility] [address], [place], [place] [address]  Acetaminophen level     Status: Abnormal   Collection Time: 08/31/21  2:35 PM  Result Value Ref Range   Acetaminophen (Tylenol), Serum <10 (L) 10 - 30 ug/mL    Comment: (NOTE) Therapeutic concentrations vary significantly. A range of 10-30 ug/mL  may be an effective concentration for many patients. However, some  are best treated at concentrations outside of this range. Acetaminophen concentrations >150 ug/mL at 4 hours after ingestion  and >50 ug/mL at 12 hours after ingestion are often associated with  toxic reactions.  Performed at [facility], [place] [address], [place], [place] [address]   Urinalysis, Routine w reflex microscopic Urine, Clean Catch  Status: Abnormal   Collection Time: 08/31/21  2:35 PM  Result Value Ref Range   Color, Urine COLORLESS (A) YELLOW   APPearance CLEAR CLEAR   Specific Stacy Poole, Urine 1.001 (L) 1.005 - 1.030   pH 6.0 5.0 - 8.0   Glucose, UA NEGATIVE NEGATIVE mg/dL   Hgb urine dipstick NEGATIVE NEGATIVE   Bilirubin Urine NEGATIVE NEGATIVE   Ketones, ur NEGATIVE NEGATIVE mg/dL   Protein, ur NEGATIVE NEGATIVE mg/dL   Nitrite NEGATIVE NEGATIVE   Leukocytes,Ua NEGATIVE NEGATIVE    Comment: Performed at [facility] [address], [place], [place] [address]  Pregnancy,  urine     Status: None   Collection Time: 08/31/21  2:35 PM  Result Value Ref Range   Preg Test, Ur NEGATIVE NEGATIVE    Comment: Performed at [facility] [address] [place], [place] [address]  I-Stat beta hCG blood, ED     Status: None   Collection Time: 08/31/21  3:22 PM  Result Value Ref Range   I-stat hCG, quantitative <5.0 <5 mIU/mL   Comment 3            Comment:   GEST. AGE      CONC.  (mIU/mL)   <=1 WEEK        5 - 50     2 WEEKS       50 - 500     3 WEEKS       100 - 10,000     4 WEEKS     1,000 - 30,000        FEMALE AND NON-PREGNANT FEMALE:     LESS THAN 5 mIU/mL   Resp Panel by RT-PCR (Flu A&B, Covid) Nasopharyngeal Swab     Status: None   Collection Time: 08/31/21  3:40 PM   Specimen: Nasopharyngeal Swab; Nasopharyngeal(NP) swabs in vial transport medium  Result Value Ref Range   SARS Coronavirus 2 by RT PCR NEGATIVE NEGATIVE    Comment: (NOTE) SARS-CoV-2 target nucleic acids are NOT DETECTED.  The SARS-CoV-2 RNA is generally detectable in upper respiratory specimens during the acute phase of infection. The lowest concentration of SARS-CoV-2 viral copies this assay can detect is 138 copies/mL. A negative result does not preclude SARS-Cov-2 infection and should not be used as the sole basis for treatment or other patient management decisions. A negative result may occur with  improper specimen collection/handling, submission of specimen other than nasopharyngeal swab, presence of viral mutation(s) within the areas targeted by this assay, and inadequate number of viral copies(<138 copies/mL). A negative result must be combined with clinical observations, patient history, and epidemiological information. The expected result is Negative.  Fact Sheet for Patients:  EntrepreneurPulse.com.au  Fact Sheet for Healthcare Providers:  IncredibleEmployment.be  This test is no t yet approved or cleared by the [place] FDA  and  has been authorized for detection and/or diagnosis of SARS-CoV-2 by FDA under an Emergency Use Authorization (EUA). This EUA will remain  in effect (meaning this test can be used) for the duration of the COVID-19 declaration under Section 564(b)(1) of the Act, 21 U.S.C.section 360bbb-3(b)(1), unless the authorization is terminated  or revoked sooner.       Influenza A by PCR NEGATIVE NEGATIVE   Influenza B by PCR NEGATIVE NEGATIVE    Comment: (NOTE) The Xpert Xpress SARS-CoV-2/FLU/RSV plus assay is intended as an aid in the diagnosis of influenza from Nasopharyngeal swab specimens and should not be used as  a sole basis for treatment. Nasal washings and aspirates are unacceptable for Xpert Xpress SARS-CoV-2/FLU/RSV testing.  Fact Sheet for Patients: EntrepreneurPulse.com.au  Fact Sheet for Healthcare Providers: IncredibleEmployment.be  This test is not yet approved or cleared by the [place] FDA and has been authorized for detection and/or diagnosis of SARS-CoV-2 by FDA under an Emergency Use Authorization (EUA). This EUA will remain in effect (meaning this test can be used) for the duration of the COVID-19 declaration under Section 564(b)(1) of the Act, 21 U.S.C. section 360bbb-3(b)(1), unless the authorization is terminated or revoked.  Performed at [facility], [place] [address], [place], [place] [address]     Blood Alcohol level:  Lab Results  Component Value Date   ETH 254 (H) 08/31/2021   ETH <10 [id number]    Metabolic Disorder Labs:  Lab Results  Component Value Date   HGBA1C 5.0 06/01/2021   MPG 96.8 06/01/2021   MPG 99.67 10/15/2019   No results found for: PROLACTIN Lab Results  Component Value Date   CHOL 162 06/01/2021   TRIG 52 06/01/2021   HDL 107 06/01/2021   CHOLHDL 1.5 06/01/2021   VLDL 10 06/01/2021   LDLCALC 45 06/01/2021   LDLCALC 71 10/15/2019    Current Medications: Current  Facility-Administered Medications  Medication Dose Route Frequency Provider Last Rate Last Admin   acetaminophen (TYLENOL) tablet 650 mg  650 mg Oral Q6H PRN Rozetta Nunnery, NP       alum & mag hydroxide-simeth (MAALOX/MYLANTA) 200-200-20 MG/5ML suspension 30 mL  30 mL Oral Q4H PRN Rozetta Nunnery, NP       carbamazepine (TEGRETOL XR) 12 hr tablet 200 mg  200 mg Oral BID Lindon Romp A, NP       cloNIDine (CATAPRES) tablet 0.1 mg  0.1 mg Oral BID Lindon Romp A, NP       famotidine (PEPCID) tablet 20 mg  20 mg Oral Daily Lindon Romp A, NP       folic acid (FOLVITE) tablet 1 mg  1 mg Oral Daily Lindon Romp A, NP       hydrOXYzine (ATARAX) tablet 25 mg  25 mg Oral TID PRN Rozetta Nunnery, NP       [START ON 09/02/2021] influenza vac split quadrivalent PF (FLUARIX) injection 0.5 mL  0.5 mL Intramuscular Tomorrow-1000 Rochel Privett, Jackie Plum, MD       loperamide (IMODIUM) capsule 2-4 mg  2-4 mg Oral PRN Rozetta Nunnery, NP       LORazepam (ATIVAN) tablet 1 mg  1 mg Oral Q6H PRN Lindon Romp A, NP       magnesium hydroxide (MILK OF MAGNESIA) suspension 30 mL  30 mL Oral Daily PRN Rozetta Nunnery, NP       [START ON 09/02/2021] multivitamin (RENA-VIT) tablet 1 tablet  1 tablet Oral QHS Damya Comley, Jackie Plum, MD       OLANZapine zydis (ZYPREXA) disintegrating tablet 5 mg  5 mg Oral Q6H PRN Tejay Hubert, Jackie Plum, MD       Or   OLANZapine (ZYPREXA) injection 5 mg  5 mg Intramuscular Q6H PRN Archibald Marchetta, Jackie Plum, MD       OLANZapine (ZYPREXA) tablet 5 mg  5 mg Oral QHS Ruble Pumphrey, Jackie Plum, MD       ondansetron (ZOFRAN-ODT) disintegrating tablet 4 mg  4 mg Oral Q6H PRN Lindon Romp A, NP       traZODone (DESYREL) tablet 100 mg  100 mg Oral QHS PRN  Rozetta Nunnery, NP       white petrolatum (VASELINE) gel            PTA Medications: Medications Prior to Admission  Medication Sig Dispense Refill Last Dose   carbamazepine (TEGRETOL XR) 200 MG 12 hr tablet Take 1 tablet (200 mg total) by mouth 2 (two) times  daily. 60 tablet 0    cloNIDine (CATAPRES) 0.1 MG tablet Take 1 tablet (0.1 mg total) by mouth 2 (two) times daily. 60 tablet 0    famotidine (PEPCID) 20 MG tablet Take 1 tablet (20 mg total) by mouth daily. (Patient not taking: Reported on 08/31/2021) 30 tablet 0    hydrOXYzine (ATARAX/VISTARIL) 25 MG tablet Take 1 tablet (25 mg total) by mouth 3 (three) times daily as needed for anxiety. 90 tablet 0    loratadine (CLARITIN) 10 MG tablet Take 1 tablet (10 mg total) by mouth daily. (Patient not taking: Reported on 08/31/2021) 30 tablet 0    OLANZapine (ZYPREXA) 10 MG tablet Take 1 tablet (10 mg total) by mouth daily. 30 tablet 0    OLANZapine (ZYPREXA) 20 MG tablet Take 1 tablet (20 mg total) by mouth at bedtime. (Patient not taking: Reported on 08/31/2021) 30 tablet 0    traZODone (DESYREL) 100 MG tablet Take 1 tablet (100 mg total) by mouth at bedtime as needed for sleep. 30 tablet 0     Musculoskeletal: Strength & Muscle Tone: within normal limits Gait & Station: normal Patient leans: N/A            Psychiatric Specialty Exam:  Presentation  General Appearance: Disheveled  Eye Contact:Fleeting  Speech:Normal Rate  Speech Volume:Normal  Handedness:Right   Mood and Affect  Mood:Depressed; Irritable  Affect:Blunt   Thought Process  Thought Processes:Coherent  Duration of Psychotic Symptoms: Less than six months  Past Diagnosis of Schizophrenia or Psychoactive disorder: Yes  Descriptions of Associations:Intact  Orientation:Full (Time, Place and Person)  Thought Content:Rumination  Hallucinations:Hallucinations: None  Ideas of Reference:None  Suicidal Thoughts:Suicidal Thoughts: No  Homicidal Thoughts:Homicidal Thoughts: No   Sensorium  Memory:Immediate Fair  Judgment:Poor  Insight:Poor   Executive Functions  Concentration:Fair  Attention Span:Poor  Recall:Poor  Fund of Knowledge:Fair  Language:Fair   Psychomotor Activity   Psychomotor Activity:Psychomotor Activity: Decreased; Restlessness   Assets  Assets:Talents/Skills; Communication Skills; Housing   Sleep  Sleep:Sleep: Fair    Physical Exam: Physical Exam Vitals and nursing note reviewed.  Constitutional:      Appearance: Normal appearance.  HENT:     Head: Normocephalic.  Eyes:     Extraocular Movements: Extraocular movements intact.  Pulmonary:     Effort: Pulmonary effort is normal.  Musculoskeletal:        General: Normal range of motion.     Cervical back: Normal range of motion.  Neurological:     General: No focal deficit present.     Mental Status: She is alert and oriented to person, place, and time.  Psychiatric:        Attention and Perception: Attention normal.        Mood and Affect: Mood is depressed. Affect is blunt.        Speech: Speech normal.        Behavior: Behavior normal. Behavior is cooperative.        Thought Content: Thought content is not paranoid or delusional. Thought content does not include homicidal or suicidal ideation.        Cognition and Memory: Cognition and memory normal.  Judgment: Judgment is impulsive.   Constitutional:  Positive for malaise/fatigue.  HENT:  Negative for hearing loss.   Respiratory:  Negative for cough.   Cardiovascular:  Negative for chest pain.  Gastrointestinal:  Negative for nausea and vomiting.  Neurological:  Negative for dizziness and headaches.  Psychiatric/Behavioral:  Positive for depression and substance abuse. Negative for hallucinations and suicidal ideas. The patient does not have insomnia.   Blood pressure 113/86, pulse 100, temperature 98 F (36.7 C), temperature source Oral, resp. rate 18, height 5\' 3"  (1.6 m), weight 73.9 kg, SpO2 100 %. Body mass index is 28.87 kg/m.  Treatment Plan Summary: Daily contact with patient to assess and evaluate symptoms and progress in treatment, Medication management, and Plan   PLAN OF CARE:   Safety and  Monitoring --  Admission to inpatient psychiatric unit for safety, stabilization and treatment -- Daily contact with patient to assess and evaluate symptoms and progress in treatment -- Patient's case to be discussed in multi-disciplinary team meeting. -- Patient will be encouraged to participate in the therapeutic group milieu. -- Observation Level : q15 minute checks -- Vital signs:  q12 hours -- Precautions: suicide.  Plan  -Monitor Vitals. -Monitor for thoughts of harm to self or others -Monitor for psychosis, disorganization or changes to cognition -Monitor for withdrawal symptoms. -Monitor for medication side effects.   Labs/Studies: A1c, lipids   Medications: CIWA, but also restart home medications and encourage compliance      Observation Level/Precautions:  [facility] Detox 15 minute checks  Laboratory:  HbAIC  Psychotherapy:  insight oriented  Medications:  tegretol, zyprexa  Consultations:  n/a  Discharge Concerns:  community supports  Estimated LOS: 7-10  Other:  n/a   Physician Treatment Plan for Primary Diagnosis: Schizoaffective disorder, bipolar type (HCC) Long Term Goal(s): Improvement in symptoms so as ready for discharge  Short Term Goals: Ability to identify changes in lifestyle to reduce recurrence of condition will improve, Ability to verbalize feelings will improve, Ability to disclose and discuss suicidal ideas, Ability to demonstrate self-control will improve, Ability to identify and develop effective coping behaviors will improve, Ability to maintain clinical measurements within normal limits will improve, Compliance with prescribed medications will improve, and Ability to identify triggers associated with substance abuse/mental health issues will improve  Physician Treatment Plan for Secondary Diagnosis: Principal Problem:   Schizoaffective disorder, bipolar type (HCC) Active Problems:   Alcohol use disorder, moderate, dependence (HCC)   PTSD  (post-traumatic stress disorder)  Long Term Goal(s): Improvement in symptoms so as ready for discharge  Short Term Goals: Ability to identify changes in lifestyle to reduce recurrence of condition will improve, Ability to verbalize feelings will improve, Ability to disclose and discuss suicidal ideas, Ability to demonstrate self-control will improve, Ability to identify and develop effective coping behaviors will improve, Ability to maintain clinical measurements within normal limits will improve, Compliance with prescribed medications will improve, and Ability to identify triggers associated with substance abuse/mental health issues will improve  I certify that inpatient services furnished can reasonably be expected to improve the patient's condition.    , MD 12/29/20223:20 PM

## 2021-09-01 NOTE — ED Notes (Signed)
3 copies of IVC paperwork given to RN, original in [facility], 1 copy in medical records

## 2021-09-01 NOTE — ED Notes (Signed)
[employer] arrives to transport pt. Pt leaves with [employer], [employer] officer has all of pts belongings and pt packet for [facility]. RN gave report to [facility].
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
## 2021-09-01 NOTE — ED Notes (Signed)
Pt wanded by security and belongings in visitor locker #2 in purple zone.
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
## 2021-09-01 NOTE — Progress Notes (Signed)
Pt given PRN Vistaril 25 mg and Zyprexa Zydis 5 mg both PO at 1711 for increased anxiety and agitation related to taking scheduled evening medications. Observed cursing, tearful and yelling "I'm feeling better now not taking those stupid ass, crazy meds but y'all still want to keep giving it to me. Y'all just don't fucking care". Required multiple verbal redirections which were ineffective at the time. Pt calmer, awake in her room when reassessed at 1800. Safety maintained on and off unit.

## 2021-09-01 NOTE — ED Notes (Signed)
Pt changed into purple scrubs 

## 2021-09-01 NOTE — ED Notes (Signed)
RN called [employer] for transport, pt is on the list for transport to [facility]. RN retrieved all of pts belongings for transport, working on transfer packet.

## 2021-09-02 ENCOUNTER — Encounter (HOSPITAL_COMMUNITY): Payer: Self-pay

## 2021-09-02 LAB — LIPID PANEL
Cholesterol: 191 mg/dL (ref 0–200)
HDL: 98 mg/dL (ref >40)
LDL Cholesterol: 78 mg/dL (ref 0–99)
Total CHOL/HDL Ratio: 1.9 RATIO
Triglycerides: 73 mg/dL (ref <150)
VLDL: 15 mg/dL (ref 0–40)

## 2021-09-02 LAB — VITAMIN D 25 HYDROXY (VIT D DEFICIENCY, FRACTURES): Vit D, 25-Hydroxy: 17.53 ng/mL — ABNORMAL LOW (ref 30–100)

## 2021-09-02 LAB — CARBAMAZEPINE LEVEL, TOTAL: Carbamazepine Lvl: <2 ug/mL — ABNORMAL LOW (ref 4.0–12.0)

## 2021-09-02 LAB — MAGNESIUM: Magnesium: 2 mg/dL (ref 1.7–2.4)

## 2021-09-02 LAB — TSH: TSH: 3.683 u[IU]/mL (ref 0.350–4.500)

## 2021-09-02 LAB — HEMOGLOBIN A1C
Hgb A1c MFr Bld: 4.8 % (ref 4.8–5.6)
Mean Plasma Glucose: 91.06 mg/dL

## 2021-09-02 LAB — AMMONIA: Ammonia: 19 umol/L (ref 9–35)
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
## 2021-09-02 NOTE — Group Note (Signed)
LCSW Group Therapy Note  Group Date: 09/02/2021 Start Time: 1100 End Time: 1200   Type of Therapy and Topic:  Group Therapy - Healthy vs Unhealthy Coping Skills  Participation Level:  Did Not Attend   Description of Group The focus of this group was to determine what unhealthy coping techniques typically are used by group members and what healthy coping techniques would be helpful in coping with various problems. Patients were guided in becoming aware of the differences between healthy and unhealthy coping techniques. Patients were asked to identify 2-3 healthy coping skills they would like to learn to use more effectively.  Therapeutic Goals Patients learned that coping is what human beings do all day long to deal with various situations in their lives Patients defined and discussed healthy vs unhealthy coping techniques Patients identified their preferred coping techniques and identified whether these were healthy or unhealthy Patients determined 2-3 healthy coping skills they would like to become more familiar with and use more often. Patients provided support and ideas to each other   Summary of Patient Progress:  Did not attend   Therapeutic Modalities Cognitive Behavioral Therapy Motivational Interviewing  Otelia Santee, LCSW 09/02/2021  11:30 AM

## 2021-09-02 NOTE — BH IP Treatment Plan (Signed)
Interdisciplinary Treatment and Diagnostic Plan Update  09/02/2021 Time of Session: 1:35pm TIFFONY KITE MRN: [id number]  Principal Diagnosis: Schizoaffective disorder, bipolar type ([place])  Secondary Diagnoses: Principal Problem:   Schizoaffective disorder, bipolar type ([place]) Active Problems:   Alcohol use disorder, moderate, dependence (HCC)   PTSD (post-traumatic stress disorder)   Current Medications:  Current Facility-Administered Medications  Medication Dose Route Frequency Provider Last Rate Last Admin   acetaminophen (TYLENOL) tablet 650 mg  650 mg Oral Q6H PRN Rozetta Nunnery, NP       alum & mag hydroxide-simeth (MAALOX/MYLANTA) 200-200-20 MG/5ML suspension 30 mL  30 mL Oral Q4H PRN Rozetta Nunnery, NP       carbamazepine (TEGRETOL XR) 12 hr tablet 200 mg  200 mg Oral BID Lindon Romp A, NP   200 mg at 09/02/21 0825   cloNIDine (CATAPRES) tablet 0.1 mg  0.1 mg Oral BID Lindon Romp A, NP   0.1 mg at 09/02/21 0825   famotidine (PEPCID) tablet 20 mg  20 mg Oral Daily Lindon Romp A, NP   20 mg at 32/35/57 3220   folic acid (FOLVITE) tablet 1 mg  1 mg Oral Daily Lindon Romp A, NP   1 mg at 09/02/21 2542   hydrOXYzine (ATARAX) tablet 25 mg  25 mg Oral TID PRN Rozetta Nunnery, NP   25 mg at 09/02/21 0310   loperamide (IMODIUM) capsule 2-4 mg  2-4 mg Oral PRN Rozetta Nunnery, NP       LORazepam (ATIVAN) tablet 1 mg  1 mg Oral Q6H PRN Rozetta Nunnery, NP       magnesium hydroxide (MILK OF MAGNESIA) suspension 30 mL  30 mL Oral Daily PRN Lindon Romp A, NP   30 mL at 09/02/21 7062   multivitamin (RENA-VIT) tablet 1 tablet  1 tablet Oral QHS Hill, Jackie Plum, MD       OLANZapine zydis (ZYPREXA) disintegrating tablet 5 mg  5 mg Oral Q6H PRN Maida Sale, MD   5 mg at 09/01/21 1711   Or   OLANZapine (ZYPREXA) injection 5 mg  5 mg Intramuscular Q6H PRN Maida Sale, MD       OLANZapine (ZYPREXA) tablet 5 mg  5 mg Oral QHS Hill, Jackie Plum, MD   5 mg at 09/01/21  2034   ondansetron (ZOFRAN-ODT) disintegrating tablet 4 mg  4 mg Oral Q6H PRN Rozetta Nunnery, NP       traZODone (DESYREL) tablet 100 mg  100 mg Oral QHS PRN Rozetta Nunnery, NP   100 mg at 09/01/21 2034   PTA Medications: Medications Prior to Admission  Medication Sig Dispense Refill Last Dose   carbamazepine (TEGRETOL XR) 200 MG 12 hr tablet Take 1 tablet (200 mg total) by mouth 2 (two) times daily. 60 tablet 0    cloNIDine (CATAPRES) 0.1 MG tablet Take 1 tablet (0.1 mg total) by mouth 2 (two) times daily. 60 tablet 0    famotidine (PEPCID) 20 MG tablet Take 1 tablet (20 mg total) by mouth daily. (Patient not taking: Reported on 08/31/2021) 30 tablet 0    hydrOXYzine (ATARAX/VISTARIL) 25 MG tablet Take 1 tablet (25 mg total) by mouth 3 (three) times daily as needed for anxiety. 90 tablet 0    loratadine (CLARITIN) 10 MG tablet Take 1 tablet (10 mg total) by mouth daily. (Patient not taking: Reported on 08/31/2021) 30 tablet 0    OLANZapine (ZYPREXA) 10 MG tablet Take 1 tablet (10  mg total) by mouth daily. 30 tablet 0    OLANZapine (ZYPREXA) 20 MG tablet Take 1 tablet (20 mg total) by mouth at bedtime. (Patient not taking: Reported on 08/31/2021) 30 tablet 0    traZODone (DESYREL) 100 MG tablet Take 1 tablet (100 mg total) by mouth at bedtime as needed for sleep. 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   Physician Treatment Plan for Primary Diagnosis: Schizoaffective disorder, bipolar type ([place]) Long Term Goal(s): Improvement in symptoms so as ready for discharge   Short Term Goals: Ability to identify changes  in lifestyle to reduce recurrence of condition will improve Ability to verbalize feelings will improve Ability to disclose and discuss suicidal ideas Ability to demonstrate self-control will improve Ability to identify and develop effective coping behaviors will improve Ability to maintain clinical measurements within normal limits will improve Compliance with prescribed medications will improve Ability to identify triggers associated with substance abuse/mental health issues will improve  Medication Management: Evaluate patient's response, side effects, and tolerance of medication regimen.  Therapeutic Interventions: 1 to 1 sessions, Unit Group sessions and Medication administration.  Evaluation of Outcomes: Not Met  Physician Treatment Plan for Secondary Diagnosis: Principal Problem:   Schizoaffective disorder, bipolar type ([place]) Active Problems:   Alcohol use disorder, moderate, dependence (HCC)   PTSD (post-traumatic stress disorder)  Long Term Goal(s): Improvement in symptoms so as ready for discharge   Short Term Goals: Ability to identify changes in lifestyle to reduce recurrence of condition will improve Ability to verbalize feelings will improve Ability to disclose and discuss suicidal ideas Ability to demonstrate self-control will improve Ability to identify and develop effective coping behaviors will improve Ability to maintain clinical measurements within normal limits will improve Compliance with prescribed medications will improve Ability to identify triggers associated with substance abuse/mental health issues will improve     Medication Management: Evaluate patient's response, side effects, and tolerance of medication regimen.  Therapeutic Interventions: 1 to 1 sessions, Unit Group sessions and Medication administration.  Evaluation of Outcomes: Not Met   RN Treatment Plan for Primary Diagnosis: Schizoaffective disorder, bipolar type ([name]) Long Term Goal(s):  Knowledge of disease and therapeutic regimen to maintain health will improve  Short Term Goals: Ability to remain free from injury will improve, Ability to verbalize frustration and anger appropriately will improve, Ability to demonstrate self-control, Ability to participate in decision making will improve, Ability to identify and develop effective coping behaviors will improve, and Compliance with prescribed medications will improve  Medication Management: RN will administer medications as ordered by provider, will assess and evaluate patient's response and provide education to patient for prescribed medication. RN will report any adverse and/or side effects to prescribing provider.  Therapeutic Interventions: 1 on 1 counseling sessions, Psychoeducation, Medication administration, Evaluate responses to treatment, Monitor vital signs and CBGs as ordered, Perform/monitor CIWA, COWS, AIMS and Fall Risk screenings as ordered, Perform wound care treatments as ordered.  Evaluation of Outcomes: Not Met   LCSW Treatment Plan for Primary Diagnosis: Schizoaffective disorder, bipolar type ([place]) Long Term Goal(s): Safe transition to appropriate next level of care at discharge, Engage patient in therapeutic group addressing interpersonal concerns.  Short Term Goals: Engage patient in aftercare planning with referrals and resources, Increase social support, Increase ability to appropriately verbalize feelings, Facilitate patient progression through stages of change regarding substance use diagnoses and concerns, Identify triggers associated with mental health/substance abuse issues, and Increase skills for wellness and recovery  Therapeutic Interventions: Assess for all discharge needs, 1 to 1 time with Social worker, Explore available resources and support systems, Assess for adequacy in community support network, Educate family and significant other(s) on suicide prevention, Complete Psychosocial Assessment,  Interpersonal group therapy.  Evaluation of Outcomes: Not Met   Progress in Treatment: Attending groups: No. Participating in groups: No. Taking medication as prescribed: Yes. and No. Toleration medication: Yes. Family/Significant other contact made: No, will contact:  declined consents Patient understands diagnosis: Yes. and No. Discussing patient identified problems/goals with staff: Yes. Medical problems stabilized or resolved: Yes. Denies suicidal/homicidal ideation: Yes. Issues/concerns per patient self-inventory: No.   New problem(s) identified: No, Describe:  none  New Short Term/Long Term Goal(s): detox, medication management for mood stabilization; elimination of SI thoughts; development of comprehensive mental wellness/sobriety plan  Patient Goals:  "Not drinking"  Discharge Plan or Barriers: Pt is interested in being set up with substance use counseling at the [facility] and to return to [name] for medication management  Reason for Continuation of Hospitalization: Depression Medication stabilization Withdrawal symptoms  Estimated Length of Stay: 3-5 days   Scribe for Treatment Team: Vassie Moselle, LCSW 09/02/2021 1:51 PM

## 2021-09-02 NOTE — Progress Notes (Signed)
Recreation Therapy Notes  Patient admitted to unit 12.29.22. Due to admission within last year, no new recreation therapy assessment conducted at this time. Last assessment conducted on 10.20.2    Reason for current admission per patient, drinking.  Patient reports no changes in stressors from previous admission.  Patient reports strengths as being a good speaker and good at defending others.  Patient reports areas of improvement as having better self control and "playing the long game and not seeking short term gratification.  Patient reports goal of "not get too down on myself about money".  Patient denies SI, HI, AVH at this time.     Information found below from assessment conducted 10.20.22.  Stressors: "regular life issues and needing my medication"  Coping Skills: Isolation, Journal, TV, Sports, Music, Exercise, Meditate, Deep Breathing, Talk, Prayer, Art, Avoidance, Read, Hot Bath/Shower  Leisure Interests: Walking, Social with friends, Listen to music, Curator, TV  Patient Strengths: Brave, Good Heart, Creative  Areas of Improvement: Being self critical- need to rest, be patient and be grateful for what she has  Goal for Hospitalization: "to improve whatever areas I can while I'm here"    Stacy Poole Lillia Abed, Richardean Sale, Corene Resnick A 09/02/2021 1:54 PM

## 2021-09-02 NOTE — Progress Notes (Signed)
D:  Arthelia was in and out of her room this evening.  Minimal interaction with staff or peers.  She has been complaining of increased hunger and ate multiple snacks.  She denied SI/HI or AVH.  She was minimal but pleasant.  She did take her hs medications without difficulty.  She was given trazodone and hydroxyzine with good relief.   A:  1:1 with RN for support and encouragement.  Medications given as ordered.  Q 15 minute checks maintained for safety.  Encouraged participation in group and unit activities. R:  She remains safe on the unit.  She is currently resting with her eyes closed and appears to be asleep.  We will continue to monitor the progress towards her goals.    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 Denies  Danger to Others  Danger to Others None reported or observed

## 2021-09-02 NOTE — Group Note (Signed)
Date:  09/02/2021 Time:  11:31 AM  Group Topic/Focus:  Goals Group:   The focus of this group is to help patients establish daily goals to achieve during treatment and discuss how the patient can incorporate goal setting into their daily lives to aide in recovery.    Participation Level:  Active  Participation Quality:  Appropriate  Affect:  Appropriate  Cognitive:  Appropriate  Insight: Appropriate and Good  Engagement in Group:  Engaged  Modes of Intervention:  Discussion  Additional Comments:  Pt has a goal of preparing a discharge plan with the social worker and MD.  Stacy Poole Maylen Waltermire 09/02/2021, 11:31 AM

## 2021-09-02 NOTE — Group Note (Signed)
Recreation Therapy Group Note   Group Topic:Personal Development  Group Date: 09/02/2021 Start Time: 1005 End Time: 1040 Facilitators: [name] Rancher, LRT,CTRS Location: [address] Dayroom   Goal Area(s) Addresses:  Patient will identify some accomplishments and low points of this past year. Patient will identify goals for the upcoming year.  Group Description: LRT and patients discussed this past year and looking forward to the upcoming year.  Patients were given a worksheet that broke down areas for patients to reflect on such as goals achieved, what has inspired them, lowest point, new skills learned, lessons learned, etc.  Patients then shared the areas they were comfortable with the rest of the group.   Affect/Mood: Flat   Participation Level: Minimal   Participation Quality: Independent   Behavior: Withdrawn   Speech/Thought Process: N/A   Insight: N/A   Judgement: N/A   Modes of Intervention: Worksheet   Patient Response to Interventions:  N/A   Education Outcome:  Acknowledges education and In group clarification offered    Clinical Observations/Individualized Feedback: Pt was in group long enough to get the instructions and get the worksheet.  Pt expressed not feeling well and wanting to lay down.  Pt was excused from group.    Plan: Continue to engage patient in RT group sessions 2-3x/week.   [name] Rancher, LRT,CTRS 09/02/2021 12:08 PM

## 2021-09-02 NOTE — Progress Notes (Signed)
°   09/02/21 0500  Sleep  Number of Hours 7.75

## 2021-09-03 MED ORDER — VITAMIN D3 25 MCG PO TABS
1000.0000 [IU] | ORAL_TABLET | Freq: Every day | ORAL | Status: DC
  Administered 2021-09-03 – 2021-09-05 (×3): 1000 [IU] via ORAL
  Filled 2021-09-03 (×5): qty 1

## 2021-09-03 NOTE — Progress Notes (Signed)
EKG completed and placed on the front of the chart  
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
## 2021-09-03 NOTE — Group Note (Signed)
[facility] LCSW Group Therapy Note  Date:  09/03/2021   Type of Therapy and Topic:  Group Therapy:  Focus for the New Year  Participation Level:  Active   Description of Group:  The focus of this group was to provide patients with an opportunity to think about and discuss what they can work on this coming year that will result in them being happier and healthier one year from today.  It was reviewed how "new year's resolutions" often fade in importance within a few days, so patients were encouraged to think in a broader, more impactful way about what they wish to focus on to change their lives.  Therapeutic Goals Patients discussed in general the benefit of having goals to work on Patients described their own personal goals/focus for the next year that will enable them to be happier and healthier Patients received encouragement from each other and CSW Patients provided support and ideas to each other  Summary of Patient Progress: During group, patient expressed that her focus for the upcoming year is going to be sobriety. Pt wants to get back to painting and allow painting to be her escape as a healthier option.     Therapeutic Modalities Processing   Ambrose Mantle, LCSW (led group)  Creola Corn, LCSWA (wrote notes)

## 2021-09-03 NOTE — [facility] Group Notes (Signed)
[facility] Group Notes:  (Nursing/MHT/Case Management/Adjunct)  Date:  09/03/2021  Time:  10:57 AM  Type of Therapy:   Orientation/Goals group  Participation Level:  Active  Participation Quality:  Appropriate  Affect:  Appropriate  Cognitive:  Appropriate  Insight:  Appropriate  Engagement in Group:  Engaged  Modes of Intervention:  Discussion, Education, and Orientation  Summary of Progress/Problems: Pt goal for today is to work on discharge plan.   Stacy Poole 09/03/2021, 10:57 AM

## 2021-09-03 NOTE — Progress Notes (Signed)
°   09/03/21 1948  Psych Admission Type (Psych Patients Only)  Admission Status Involuntary  Psychosocial Assessment  Patient Complaints Anxiety  Eye Contact Fair  Facial Expression Anxious  Affect Appropriate to circumstance  Speech Soft;Logical/coherent  Interaction Assertive  Motor Activity Fidgety  Appearance/Hygiene Unremarkable  Behavior Characteristics Cooperative;Anxious  Mood Anxious  Thought Process  Coherency WDL  Content WDL  Delusions None reported or observed  Perception WDL  Hallucination None reported or observed  Judgment Limited  Confusion None  Danger to Self  Current suicidal ideation? Denies  Danger to Others  Danger to Others None reported or observed   Pt seen in hallway. Pt denies SI, HI, AVH and pain. Pt rates anxiety 4/10 and denies depression. Pt says she is here because she was self-medicating with alcohol (BAL 254). "I knew I needed to get somewhere to get help. I don't want to drink anymore." Pt denies any withdrawal symptoms. Pt does endorse heartburn. Pt takes Pepcid here daily. Pt says it could be d/t her anxiety. Pt denies having issues with heartburn outside of hospital. Pt pleasant and cooperative. Takes medications as prescribed.

## 2021-09-03 NOTE — Progress Notes (Signed)
Pt denies SI/HI/AVH.  Pt is speaking in soft tone of voice, and pt is assertive and makes her needs known.  Pt took medications without incident.  RN will continue to monitor and provide support as needed.

## 2021-09-03 NOTE — Progress Notes (Signed)
[facility] Group Notes:  (Nursing/MHT/Case Management/Adjunct)  Date:  09/03/2021  Time:  2015 Type of Therapy:   wrap up group  Participation Level:  Active  Participation Quality:  Appropriate, Attentive, Sharing, and Supportive  Affect:  Depressed  Cognitive:  Alert  Insight:  Improving  Engagement in Group:  Engaged  Modes of Intervention:  Clarification, Education, and Support  Summary of Progress/Problems: Positive thinking and positive change were discussed.   Marcille Buffy 09/03/2021, 8:39 PM

## 2021-09-03 NOTE — Progress Notes (Signed)
Pt has shown no signs or symptoms of ETOH withdrawal so far this shift.  RN will continue to assess and intervene as indicated.

## 2021-09-04 MED ORDER — DM-GUAIFENESIN ER 30-600 MG PO TB12
1.0000 | ORAL_TABLET | Freq: Two times a day (BID) | ORAL | Status: DC
  Administered 2021-09-04 – 2021-09-05 (×2): 1 via ORAL
  Filled 2021-09-04 (×5): qty 1
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
## 2021-09-04 NOTE — Group Note (Signed)
[facility] LCSW Group Therapy Note  Date/Time:  09/04/2021 11:15am-12:00pm  Type of Therapy and Topic:  Group Therapy:  Healthy and Unhealthy Supports  Participation Level:  Active   Description of Group:  Patients in this group were introduced to the idea of adding a variety of healthy supports to address the various needs in their lives, especially in reference to their plans and focus for the new year.  Patients discussed what additional healthy supports could be helpful in their recovery and wellness after discharge in order to prevent future hospitalizations.   An emphasis was placed on using counselor, doctor, therapy groups, 12-step groups, and problem-specific support groups to expand supports.    Therapeutic Goals:   1)  discuss importance of adding supports to stay well once out of the hospital  2)  compare healthy versus unhealthy supports and identify some examples of each  3)  generate ideas and descriptions of healthy supports that can be added  4)  offer mutual support about how to address unhealthy supports  5)  encourage active participation in and adherence to discharge plan    Summary of Patient Progress:  The patient stated that current healthy supports in her life are her family and her art.  She said she has a lot of supplies already.  The patient expressed a willingness to add a pillbox and outpatient program for her substance abuse as support(s) to help in her recovery journey.   Therapeutic Modalities:   Motivational Interviewing Brief Solution-Focused Therapy  Ambrose Mantle, LCSW

## 2021-09-04 NOTE — Progress Notes (Signed)
Pt denies SI/HI/AVH.  Pt is talking about future goals and things she wants to do to help reduce stress such as yoga and working on her art/jewelery.  Pt has been calm and cooperative this shift.  Pt took medications without incident.  RN will continue to assess for needs and concerns and provide assistance as indicated.

## 2021-09-04 NOTE — Progress Notes (Signed)
Pt said she was awakened by a headache 7/10. Pt given PRN Tylenol per MAR.

## 2021-09-04 NOTE — Group Note (Signed)
°  Group note already done, entered by accident °

## 2021-09-04 NOTE — Progress Notes (Signed)
Adult Psychoeducational Group Note  Date:  09/04/2021 Time:  9:18 PM  Group Topic/Focus:  Wrap-Up Group:   The focus of this group is to help patients review their daily goal of treatment and discuss progress on daily workbooks.  Participation Level:  Active  Participation Quality:  Appropriate  Affect:  Appropriate  Cognitive:  Appropriate  Insight: Appropriate  Engagement in Group:  Engaged  Modes of Intervention:  Discussion  Additional Comments:  Pt stated her goal for today was to focus on her treatment plan and talk with her doctor about her discharge plan. Pt stated she accomplished her goals today. Pt stated she talked with her doctor and her social worker about her care today. Pt rated her overall day a 10. Pt state the plan is for her to discharge on 09/05/21. Pt stated she was able to contact her mother tonight which improved her overall day. Pt stated she felt better about herself tonight. Pt stated she was able to attend all groups held today. Pt stated she was able to attend all meals. Pt stated she took all medications provided today. Pt stated her appetite was pretty good today. Pt rated her sleep last night was pretty good. Pt stated the goal tonight was to get some rest. Pt stated she had some physical pain tonight. Pt stated she had a headache. Pt rated her moderate headache a 5 on the pain level scale. Pt nurse was updated on the situation.  Pt deny visual hallucinations and auditory issues tonight. Pt denies thoughts of harming herself or others. Pt stated she would alert staff if anything changed  Felipa Furnace 09/04/2021, 9:18 PM

## 2021-09-05 DIAGNOSIS — F250 Schizoaffective disorder, bipolar type: Principal | ICD-10-CM

## 2021-09-05 MED ORDER — VITAMIN D3 25 MCG PO TABS: 1000.0000 [IU] | ORAL_TABLET | Freq: Every day | ORAL | 0 refills | Status: DC

## 2021-09-05 MED ORDER — HYDROXYZINE HCL 25 MG PO TABS: 25.0000 mg | ORAL_TABLET | Freq: Three times a day (TID) | ORAL | 0 refills | Status: DC | PRN

## 2021-09-05 MED ORDER — OLANZAPINE 5 MG PO TABS: 5.0000 mg | ORAL_TABLET | Freq: Every day | ORAL | 0 refills | Status: DC

## 2021-09-05 MED ORDER — CARBAMAZEPINE ER 200 MG PO TB12: 200.0000 mg | ORAL_TABLET | Freq: Two times a day (BID) | ORAL | 0 refills | Status: DC

## 2021-09-05 MED ORDER — CLONIDINE HCL 0.1 MG PO TABS: 0.1000 mg | ORAL_TABLET | Freq: Two times a day (BID) | ORAL | 0 refills | Status: DC

## 2021-09-05 MED ORDER — STRESS FORMULA/ZINC PO TABS: 1.0000 | ORAL_TABLET | Freq: Every day | ORAL | 0 refills | Status: DC

## 2021-09-05 MED ORDER — CLONIDINE HCL 0.1 MG PO TABS
0.1000 mg | ORAL_TABLET | Freq: Two times a day (BID) | ORAL | 0 refills | Status: DC
Start: 2021-09-05 — End: 2021-09-05

## 2021-09-05 NOTE — Plan of Care (Signed)
°  Problem: Coping: Goal: Ability to interact with others will improve Outcome: Progressing   Problem: Self-Concept: Goal: Will verbalize positive feelings about self Outcome: Progressing

## 2021-09-05 NOTE — Plan of Care (Signed)
Discharge note  Patient verbalizes readiness for discharge. Follow up plan explained, AVS, Transition record and SRA given. Prescriptions and teaching provided. Belongings returned and signed for. Suicide safety plan completed and signed. Patient verbalizes understanding. Patient denies SI/HI and assures this Clinical research associate they will seek assistance should that change. Patient discharged to lobby where mother was waiting.  Problem: Education: Goal: Knowledge of [facility] Education information/materials will improve Outcome: Adequate for Discharge Goal: Emotional status will improve Outcome: Adequate for Discharge Goal: Mental status will improve Outcome: Adequate for Discharge Goal: Verbalization of understanding the information provided will improve Outcome: Adequate for Discharge   Problem: Activity: Goal: Interest or engagement in activities will improve Outcome: Adequate for Discharge Goal: Sleeping patterns will improve Outcome: Adequate for Discharge   Problem: Coping: Goal: Ability to verbalize frustrations and anger appropriately will improve Outcome: Adequate for Discharge Goal: Ability to demonstrate self-control will improve Outcome: Adequate for Discharge   Problem: Health Behavior/Discharge Planning: Goal: Identification of resources available to assist in meeting health care needs will improve Outcome: Adequate for Discharge Goal: Compliance with treatment plan for underlying cause of condition will improve Outcome: Adequate for Discharge   Problem: Physical Regulation: Goal: Ability to maintain clinical measurements within normal limits will improve Outcome: Adequate for Discharge   Problem: Safety: Goal: Periods of time without injury will increase Outcome: Adequate for Discharge   Problem: Education: Goal: Ability to state activities that reduce stress will improve Outcome: Adequate for Discharge   Problem: Coping: Goal: Ability to identify and develop  effective coping behavior will improve Outcome: Adequate for Discharge   Problem: Self-Concept: Goal: Ability to identify factors that promote anxiety will improve Outcome: Adequate for Discharge Goal: Level of anxiety will decrease Outcome: Adequate for Discharge Goal: Ability to modify response to factors that promote anxiety will improve Outcome: Adequate for Discharge   Problem: Activity: Goal: Will identify at least one activity in which they can participate Outcome: Adequate for Discharge   Problem: Coping: Goal: Ability to identify and develop effective coping behavior will improve Outcome: Adequate for Discharge Goal: Ability to interact with others will improve 09/05/2021 1225 by Raylene Miyamoto, RN Outcome: Adequate for Discharge 09/05/2021 0827 by Raylene Miyamoto, RN Outcome: Progressing Goal: Demonstration of participation in decision-making regarding own care will improve Outcome: Adequate for Discharge Goal: Ability to use eye contact when communicating with others will improve Outcome: Adequate for Discharge   Problem: Health Behavior/Discharge Planning: Goal: Identification of resources available to assist in meeting health care needs will improve Outcome: Adequate for Discharge   Problem: Self-Concept: Goal: Will verbalize positive feelings about self 09/05/2021 1225 by Raylene Miyamoto, RN Outcome: Adequate for Discharge 09/05/2021 0827 by Raylene Miyamoto, RN Outcome: Progressing   Problem: Education: Goal: Knowledge of disease or condition will improve Outcome: Adequate for Discharge Goal: Understanding of discharge needs will improve Outcome: Adequate for Discharge   Problem: Health Behavior/Discharge Planning: Goal: Ability to identify changes in lifestyle to reduce recurrence of condition will improve Outcome: Adequate for Discharge Goal: Identification of resources available to assist in meeting health care needs will improve Outcome:  Adequate for Discharge   Problem: Physical Regulation: Goal: Complications related to the disease process, condition or treatment will be avoided or minimized Outcome: Adequate for Discharge   Problem: Safety: Goal: Ability to remain free from injury will improve Outcome: Adequate for Discharge

## 2021-09-05 NOTE — BHH Suicide Risk Assessment (Signed)
[facility] Discharge Suicide Risk Assessment   Principal Problem: Schizoaffective disorder, bipolar type (HCC) Discharge Diagnoses: Principal Problem:   Schizoaffective disorder, bipolar type (HCC) Active Problems:   Alcohol use disorder, moderate, dependence (HCC)   PTSD (post-traumatic stress disorder)   Total Time spent with patient: 30 minutes  Musculoskeletal: Strength & Muscle Tone: within normal limits Gait & Station: normal Patient leans: N/A  Psychiatric Specialty Exam  Presentation  General Appearance: Appropriate for Environment; Casual  Eye Contact:Fair  Speech:Clear and Coherent  Speech Volume:Normal  Handedness:Right   Mood and Affect  Mood:Euthymic  Duration of Depression Symptoms: No data recorded Affect:Appropriate   Thought Process  Thought Processes:Coherent  Descriptions of Associations:Intact  Orientation:Full (Time, Place and Person)  Thought Content:Abstract Reasoning  History of Schizophrenia/Schizoaffective disorder:Yes  Duration of Psychotic Symptoms:Less than six months  Hallucinations:Hallucinations: None  Ideas of Reference:None  Suicidal Thoughts:Suicidal Thoughts: No  Homicidal Thoughts:Homicidal Thoughts: No   Sensorium  Memory:Immediate Good; Remote Fair  Judgment:Fair  Insight:Good   Executive Functions  Concentration:Good  Attention Span:Fair  Recall:Fair  Fund of Knowledge:Fair  Language:Good   Psychomotor Activity  Psychomotor Activity:Psychomotor Activity: Normal   Assets  Assets:Communication Skills; Physical Health; Housing; Talents/Skills   Sleep  Sleep:Sleep: Fair Number of Hours of Sleep: 4.75   Physical Exam: Physical Exam Vitals and nursing note reviewed.  Constitutional:      Appearance: Normal appearance.  HENT:     Head: Normocephalic.  Eyes:     Extraocular Movements: Extraocular movements intact.  Pulmonary:     Effort: Pulmonary effort is normal.  Musculoskeletal:         General: Normal range of motion.     Cervical back: Normal range of motion.  Neurological:     General: No focal deficit present.     Mental Status: She is alert and oriented to person, place, and time.  Psychiatric:        Attention and Perception: Attention normal.        Mood and Affect: Mood and affect normal.        Speech: Speech normal.        Behavior: Behavior normal.        Thought Content: Thought content normal.        Cognition and Memory: Cognition and memory normal.        Judgment: Judgment normal.   Review of Systems  Constitutional:  Negative for fever.  HENT:  Positive for congestion.   Respiratory:  Negative for cough.   Cardiovascular:  Negative for chest pain.  Gastrointestinal:  Negative for nausea and vomiting.  Genitourinary:  Negative for dysuria.  Musculoskeletal:  Negative for back pain and myalgias.  Neurological:  Positive for headaches. Negative for dizziness and tremors.  Psychiatric/Behavioral:  Negative for depression, hallucinations and suicidal ideas. The patient does not have insomnia.   Blood pressure (!) 129/92, pulse (!) 103, temperature 97.9 F (36.6 C), temperature source Oral, resp. rate 18, height 5\' 3"  (1.6 m), weight 73.9 kg, SpO2 100 %. Body mass index is 28.87 kg/m.  Mental Status Per Nursing Assessment::   On Admission:  NA  Demographic Factors:  Caucasian  Loss Factors: Financial problems/change in socioeconomic status  Historical Factors: Family history of mental illness or substance abuse and Impulsivity  Risk Reduction Factors:   Sense of responsibility to family, Employed, and Positive social support  Continued Clinical Symptoms:  Dysthymia Alcohol/Substance Abuse/Dependencies  Cognitive Features That Contribute To Risk:  None    Suicide Risk:  Mild:  Suicidal ideation of limited frequency, intensity, duration, and specificity.  There are no identifiable plans, no associated intent, mild dysphoria and related  symptoms, good self-control (both objective and subjective assessment), few other risk factors, and identifiable protective factors, including available and accessible social support.   Follow-up Information     [facility] Go on 09/23/2021.   Why: You have an appointment for medication management services on 09/23/21 at 9:40 am. This appointment will be held in person (or you may change to Virtual) Contact information: [address] [place] [address] [phone]         [facility]. Go on 09/14/2021.   Specialty: Behavioral Health Why: You have an assessment appointment with this provider for substance use counseling 09/14/21 at 4:00 pm.  This appointment will be held in person. Contact information: [address] [place] [address] [phone]                 Plan Of Care/Follow-up recommendations:  Activity:  ad lib Diet:  regular Other:    Prescriptions for new medications provided for the patient to bridge to follow up appointment. The patient was informed that refills for these prescriptions are generally not provided, and patient is encouraged to attend all follow up appointments to address medication refills and adjustments.   Today's discharge was reviewed with treatment team, and the team is in agreement that the patient is ready for discharge. The patient is was of the discharge plan for today and has been given opportunity to ask questions. At time of discharge, the patient does not vocalize any acute harm to self or others, is goal directed, able to advocate for self and organizational baseline.   At discharge, the patient is instructed to:  Take all medications as prescribed. Report any adverse effects and or reactions from the medicines to her outpatient provider promptly.  Do not engage in alcohol and/or illegal drug use while on prescription medicines.  In the event of worsening symptoms, patient is instructed  to call the crisis hotline, 911 and or go to the nearest ED for appropriate evaluation and treatment of symptoms.  Follow-up with primary care provider for further care of medical issues, concerns and or health care needs. * Substance abuse follow up: it is recommended that you follow up with community support treatment, like AA/NA. It is also recommended that the patient attend 90 meetings in 90 days, otherwise known as "90 in 90" * Pregnancy: Mood stabilizing agents and other medications used in psychiatry may pose risk to pregnancy. Women of childbearing age are advised to use birth control. If you are planning on becoming pregnant, please discuss with both your OBGYN and your psychiatrist prior to stopping medication and prior to pregnancy.   Roselle Locus, MD 09/05/2021, 10:13 AM

## 2021-09-05 NOTE — Progress Notes (Signed)
Recreation Therapy Notes  INPATIENT RECREATION TR PLAN  Patient Details Name: Stacy Poole MRN: [id number] DOB: 10/23/1990 Today's Date: 09/05/2021  Rec Therapy Plan Is patient appropriate for Therapeutic Recreation?: Yes Treatment times per week: about 3 days Estimated Length of Stay: 5-7 days TR Treatment/Interventions: Recreation/leisure participation  Discharge Criteria Pt will be discharged from therapy if:: Discharged Treatment plan/goals/alternatives discussed and agreed upon by:: Patient/family  Discharge Summary Short term goals set: See patient care plan Short term goals met: Not met Progress toward goals comments: Groups attended Which groups?: Coping skills, Other (Comment) (Personal Development) Reason goals not met: Pt being discharged, not enough groups attended Therapeutic equipment acquired: N/A Reason patient discharged from therapy: Discharge from hospital Pt/family agrees with progress & goals achieved: Yes Date patient discharged from therapy: 09/05/21    Victorino Sparrow, Vickki Muff, [place] 09/05/2021, 12:32 PM

## 2021-09-05 NOTE — Plan of Care (Signed)
Patient is being discharged and only attended one recreational therapy group fully.  Patient didn't have enough recreational therapy groups to complete this goal.   [name] Rancher, LRT,CTRS
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
## 2021-09-05 NOTE — Progress Notes (Signed)
D: Pt alert and oriented. Pt reports experiencing anxiety 2/10, and denies depression at this time. Pt denies experiencing any pain at this time. Pt denies experiencing any SI/HI, or AVH at this time, describes mood as "balanced."   A: Scheduled medications administered to pt, per MD orders. Support and encouragement provided. Frequent verbal contact made. Routine safety checks conducted q15 minutes.  R: No adverse drug reactions noted. Pt verbally contracts for safety at this time. Pt complaint with medications. Pt interacts minimally with others on the unit. Pt remains safe at this time. Will continue to monitor.

## 2021-09-05 NOTE — Group Note (Signed)
Recreation Therapy Group Note   Group Topic:Coping Skills  Group Date: 09/05/2021 Start Time: 0956 End Time: 1038 Facilitators: [name] Rancher, LRT,CTRS Location: [address] Dayroom   Goal Area(s) Addresses: Patient will define what a coping skill is. Patient will create a list of healthy coping skills beginning with each letter of the alphabet. Patient will successfully identify positive coping skills they can use post d/c.  Patient will acknowledge benefit(s) of using learned coping skills post d/c.   Group Description: Coping A to Z. Patient asked to identify what a coping skill is and when they use them.  Next patients were given a blank worksheet titled "Coping Skills A-Z". Patients were instructed to come up with at least one positive coping skill per letter of the alphabet, addressing a specific challenge (ex: stress, anger, anxiety, depression, grief, doubt, isolation, self-harm/suicidal thoughts, substance use). Patients were given 15 minutes to brainstorm, before ideas were presented to the large group. Patients and LRT debriefed on the importance of coping skill selection based on situation and back-up plans when a skill tried is not effective. At the end of group, patients were given an handout of alphabetized strategies to keep for future reference.    Affect/Mood: Appropriate   Participation Level: Engaged   Participation Quality: Independent   Behavior: Appropriate   Speech/Thought Process: Distracted   Insight: Good   Judgement: Good   Modes of Intervention: Worksheet   Patient Response to Interventions:  Engaged and Resistant    Education Outcome:  Acknowledges education and In group clarification offered    Clinical Observations/Individualized Feedback: Pt started off not wanting to complete the activity due to "mind not being in it".  During group, pt interacted well with peers and eventually decided to complete the activity.  Pt had the challenge of lack of  motivation.  Pt expressed completing the activity wrong but came up with the following coping skills to deal with the challenge provided: exercise, painting, bubble baths, drinking tea, music, singing, gardening, meditate, converse with friends and burning incense. Pt was bright and appropriate throughout group.    Plan: Continue to engage patient in RT group sessions 2-3x/week.   [name] Rancher, LRT,CTRS 09/05/2021 11:25 AM

## 2021-09-05 NOTE — [facility] Group Notes (Signed)
Orientation group and Psychoeducation group °Patients were given self inventory sheets, and asked to discuss how they were feeling and one goal they would like to work on for today. Patients were then asked to read poem '' I walk down the street '' by Portia Nelson as it contributes to negative behavioral patterns - and recognizing negative choices that we make as it impacts our mental health. °Pt did not attend. °

## 2021-09-05 NOTE — Progress Notes (Signed)
°  [facility] Adult Case Management Discharge Plan :  Will you be returning to the same living situation after discharge:  Yes,  to home At discharge, do you have transportation home?: Yes,  mother to pick this patient up  Do you have the ability to pay for your medications: Yes,  has insurance  Release of information consent forms completed and in the chart;  Patient's signature needed at discharge.  Patient to Follow up at:  Follow-up Information     [facility] Go on 09/23/2021.   Why: You have an appointment for medication management services on 09/23/21 at 9:40 am. This appointment will be held in person (or you may change to Virtual) Contact information: [address] [place] [address] [phone]         [facility]. Go on 09/14/2021.   Specialty: Behavioral Health Why: You have an assessment appointment with this provider for substance use counseling 09/14/21 at 4:00 pm.  This appointment will be held in person. Contact information: [address] [place] [address] [phone]                 Next level of care provider has access to [facility] Link:no  Safety Planning and Suicide Prevention discussed: Yes,  with patient     Has patient been referred to the Quitline?: Patient refused referral  Patient has been referred for addiction treatment: Yes  Otelia Santee, LCSW 09/05/2021, 9:41 AM

## 2021-09-08 ENCOUNTER — Other Ambulatory Visit: Payer: Self-pay

## 2021-09-08 ENCOUNTER — Encounter (HOSPITAL_COMMUNITY): Payer: Self-pay

## 2021-09-08 ENCOUNTER — Emergency Department (HOSPITAL_COMMUNITY)
Admission: EM | Admit: 2021-09-08 | Discharge: 2021-09-08 | Disposition: A | Discharge status: Home / Self Care | Payer: BC Managed Care – PPO | Attending: Emergency Medicine | Admitting: Emergency Medicine

## 2021-09-08 DIAGNOSIS — F1010 Alcohol abuse, uncomplicated: Secondary | ICD-10-CM | POA: Diagnosis not present

## 2021-09-08 DIAGNOSIS — Z5321 Procedure and treatment not carried out due to patient leaving prior to being seen by health care provider: Secondary | ICD-10-CM | POA: Insufficient documentation
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
## 2021-09-15 ENCOUNTER — Encounter (HOSPITAL_COMMUNITY): Payer: Self-pay

## 2021-09-15 ENCOUNTER — Emergency Department (HOSPITAL_COMMUNITY)
Admission: EM | Admit: 2021-09-15 | Discharge: 2021-09-16 | Disposition: A | Discharge status: Home / Self Care | Payer: BC Managed Care – PPO | Attending: Emergency Medicine | Admitting: Emergency Medicine

## 2021-09-15 ENCOUNTER — Other Ambulatory Visit: Payer: Self-pay

## 2021-09-15 DIAGNOSIS — F29 Unspecified psychosis not due to a substance or known physiological condition: Secondary | ICD-10-CM | POA: Diagnosis present

## 2021-09-15 DIAGNOSIS — F250 Schizoaffective disorder, bipolar type: Secondary | ICD-10-CM | POA: Diagnosis not present

## 2021-09-15 DIAGNOSIS — Z046 Encounter for general psychiatric examination, requested by authority: Secondary | ICD-10-CM | POA: Insufficient documentation

## 2021-09-15 DIAGNOSIS — Z20822 Contact with and (suspected) exposure to covid-19: Secondary | ICD-10-CM | POA: Diagnosis not present

## 2021-09-15 DIAGNOSIS — F259 Schizoaffective disorder, unspecified: Secondary | ICD-10-CM | POA: Diagnosis present

## 2021-09-15 DIAGNOSIS — R45851 Suicidal ideations: Secondary | ICD-10-CM | POA: Insufficient documentation

## 2021-09-15 DIAGNOSIS — Y900 Blood alcohol level of less than 20 mg/100 ml: Secondary | ICD-10-CM | POA: Diagnosis not present

## 2021-09-15 LAB — COMPREHENSIVE METABOLIC PANEL
ALT: 16 U/L (ref 0–44)
AST: 17 U/L (ref 15–41)
Albumin: 3.9 g/dL (ref 3.5–5.0)
Alkaline Phosphatase: 52 U/L (ref 38–126)
Anion gap: 11 (ref 5–15)
BUN: 11 mg/dL (ref 6–20)
CO2: 24 mmol/L (ref 22–32)
Calcium: 9.2 mg/dL (ref 8.9–10.3)
Chloride: 103 mmol/L (ref 98–111)
Creatinine, Ser: 0.8 mg/dL (ref 0.44–1.00)
GFR, Estimated: >60 mL/min (ref >60)
Glucose, Bld: 95 mg/dL (ref 70–99)
Potassium: 3.9 mmol/L (ref 3.5–5.1)
Sodium: 138 mmol/L (ref 135–145)
Total Bilirubin: 0.6 mg/dL (ref 0.3–1.2)
Total Protein: 6.6 g/dL (ref 6.5–8.1)

## 2021-09-15 LAB — CBC WITH DIFFERENTIAL/PLATELET
Abs Immature Granulocytes: 0.02 10*3/uL (ref 0.00–0.07)
Basophils Absolute: 0 10*3/uL (ref 0.0–0.1)
Basophils Relative: 1 %
Eosinophils Absolute: 0.1 10*3/uL (ref 0.0–0.5)
Eosinophils Relative: 1 %
HCT: 41 % (ref 36.0–46.0)
Hemoglobin: 13.9 g/dL (ref 12.0–15.0)
Immature Granulocytes: 0 %
Lymphocytes Relative: 17 %
Lymphs Abs: 1.1 10*3/uL (ref 0.7–4.0)
MCH: 29.4 pg (ref 26.0–34.0)
MCHC: 33.9 g/dL (ref 30.0–36.0)
MCV: 86.9 fL (ref 80.0–100.0)
Monocytes Absolute: 0.5 10*3/uL (ref 0.1–1.0)
Monocytes Relative: 8 %
Neutro Abs: 4.4 10*3/uL (ref 1.7–7.7)
Neutrophils Relative %: 73 %
Platelets: 299 10*3/uL (ref 150–400)
RBC: 4.72 MIL/uL (ref 3.87–5.11)
RDW: 13.1 % (ref 11.5–15.5)
WBC: 6.1 10*3/uL (ref 4.0–10.5)
nRBC: 0 % (ref 0.0–0.2)

## 2021-09-15 LAB — RESP PANEL BY RT-PCR (FLU A&B, COVID) ARPGX2
Influenza A by PCR: NEGATIVE
Influenza B by PCR: NEGATIVE
SARS Coronavirus 2 by RT PCR: NEGATIVE

## 2021-09-15 LAB — RAPID URINE DRUG SCREEN, HOSP PERFORMED
Amphetamines: NOT DETECTED
Barbiturates: NOT DETECTED
Benzodiazepines: NOT DETECTED
Cocaine: NOT DETECTED
Opiates: NOT DETECTED
Tetrahydrocannabinol: NOT DETECTED

## 2021-09-15 LAB — ETHANOL: Alcohol, Ethyl (B): <10 mg/dL (ref <10)

## 2021-09-15 MED ORDER — ZIPRASIDONE MESYLATE 20 MG IM SOLR: 20.0000 mg | INTRAMUSCULAR | Status: DC | PRN

## 2021-09-15 MED ORDER — ALUM & MAG HYDROXIDE-SIMETH 200-200-20 MG/5ML PO SUSP: 30.0000 mL | Freq: Four times a day (QID) | ORAL | Status: DC | PRN

## 2021-09-15 MED ORDER — ZOLPIDEM TARTRATE 5 MG PO TABS: 5.0000 mg | ORAL_TABLET | Freq: Every evening | ORAL | Status: DC | PRN

## 2021-09-15 MED ORDER — CLONIDINE HCL 0.1 MG PO TABS
0.1000 mg | ORAL_TABLET | Freq: Two times a day (BID) | ORAL | Status: DC
  Administered 2021-09-15: 0.1 mg via ORAL
  Filled 2021-09-15: qty 1

## 2021-09-15 MED ORDER — NICOTINE 21 MG/24HR TD PT24
21.0000 mg | MEDICATED_PATCH | Freq: Every day | TRANSDERMAL | Status: DC
  Filled 2021-09-15: qty 1

## 2021-09-15 MED ORDER — LORAZEPAM 1 MG PO TABS
1.0000 mg | ORAL_TABLET | ORAL | Status: AC | PRN
  Administered 2021-09-15: 1 mg via ORAL
  Filled 2021-09-15: qty 1

## 2021-09-15 MED ORDER — ONDANSETRON HCL 4 MG PO TABS: 4.0000 mg | ORAL_TABLET | Freq: Three times a day (TID) | ORAL | Status: DC | PRN

## 2021-09-15 MED ORDER — CARBAMAZEPINE ER 200 MG PO TB12
200.0000 mg | ORAL_TABLET | Freq: Two times a day (BID) | ORAL | Status: DC
  Administered 2021-09-15 – 2021-09-16 (×3): 200 mg via ORAL
  Filled 2021-09-15 (×4): qty 1

## 2021-09-15 MED ORDER — ACETAMINOPHEN 325 MG PO TABS
650.0000 mg | ORAL_TABLET | ORAL | Status: DC | PRN
  Filled 2021-09-15: qty 2

## 2021-09-15 MED ORDER — OLANZAPINE 5 MG PO TABS
5.0000 mg | ORAL_TABLET | Freq: Every day | ORAL | Status: DC
  Administered 2021-09-15: 5 mg via ORAL
  Filled 2021-09-15 (×2): qty 1

## 2021-09-15 MED ORDER — RISPERIDONE 1 MG PO TBDP
2.0000 mg | ORAL_TABLET | Freq: Three times a day (TID) | ORAL | Status: DC | PRN
  Administered 2021-09-15 – 2021-09-16 (×2): 2 mg via ORAL
  Filled 2021-09-15 (×2): qty 2

## 2021-09-15 NOTE — ED Notes (Signed)
TTS evaluation being done at this time. Door closed and sitter outside the door for pt privacy.

## 2021-09-15 NOTE — ED Notes (Signed)
Belongings placed in locker 6.  
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
## 2021-09-15 NOTE — ED Triage Notes (Addendum)
Patient to ED via EMS from home. Pt's mother called EMS stating patient was having a mental health crises. Hx schizophrenia, bipolar. EMS reports patient is in a state of psychosis and unable to give an accurate history or report of complaints at this time. Per EMS, patient screaming and thrashing upon arrival to home and reporting thoughts of stabbing herself. EMS states patient is easily triggered by talk of her mental state and believes her behaviors are being caused by an unknown medical condition. Patient's mother reported to EMS that they were unable to obtain medication recently due to e-prescription not transferring to pharmacy properly. 5mg  Haldol given PTA. Patient calm upon arrival to ED.  EMS v/s: 144/95 96 HR 20 RR 100% room air 158 CBG

## 2021-09-15 NOTE — ED Notes (Signed)
Pt to be monitored overnight and re-evaluated TTS in the am. Pt calm, cooperative, and appropriate. Pt states she is feeling anxious inside, pt given prn med orders per request. Pt denies wanting ambien for sleep, states she had adverse reaction previously. No acute changes noted. Will continue to monitor. MD notified.
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
## 2021-09-15 NOTE — ED Notes (Signed)
Pt ambulatory to take a shower at this time. Pt provided with supplies needed. No acute changes noted. Will continue to monitor.

## 2021-09-15 NOTE — ED Notes (Signed)
This RN called staffing office for sitter, no sitter available until 7pm. [employer] remains at bedside

## 2021-09-16 DIAGNOSIS — R45851 Suicidal ideations: Secondary | ICD-10-CM

## 2021-09-16 NOTE — ED Notes (Signed)
Patient given discharge instructions, all questions answered. Patient in possession of all belongings, directed to the discharge area  
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
## 2021-09-16 NOTE — Discharge Instructions (Addendum)
Discharge recommendations:  Patient is to take medications as prescribed. Next outpatient psychiatric follow up appointment is with Dr. [name] on 09/23/21 at 9:40 am for medication management.  Please follow up with your primary care provider for all medical related needs.   Therapy: We recommend that patient participate in individual therapy to address mental health concerns.  Medications: The parent/guardian is to contact a medical professional and/or outpatient provider to address any new side effects that develop. Parent/guardian should update outpatient providers of any new medications and/or medication changes.   Atypical antipsychotics: If you are prescribed an atypical antipsychotic, it is recommended that your height, weight, BMI, blood pressure, fasting lipid panel, and fasting blood sugar be monitored by your outpatient providers.  Safety:  The patient should abstain from use of illicit substances/drugs and abuse of any medications. If symptoms worsen or do not continue to improve or if the patient becomes actively suicidal or homicidal then it is recommended that the patient return to the closest hospital emergency department, the [facility], or call 911 for further evaluation and treatment. National Suicide Prevention Lifeline 1-800-SUICIDE or (727)046-1797.  About 988 988 offers 24/7 access to trained crisis counselors who can help people experiencing mental health-related distress. People can call or text 988 or chat 988lifeline.org for themselves or if they are worried about a loved one who may need crisis support.

## 2021-09-16 NOTE — ED Notes (Signed)
Patient is on TTS °

## 2021-09-16 NOTE — ED Notes (Signed)
Pt complaining of feeling anxious and increased agitation. Pt ambulatory to bathroom and back to room. Pt calm, cooperative, and appropriate. Pt given meds per PRN orders. No acute changes noted. Will continue to monitor.

## 2022-01-06 ENCOUNTER — Encounter (HOSPITAL_COMMUNITY): Payer: Self-pay

## 2022-01-06 ENCOUNTER — Ambulatory Visit (HOSPITAL_COMMUNITY)
Admission: RE | Admit: 2022-01-06 | Discharge: 2022-01-06 | Disposition: A | Discharge status: Home / Self Care | Payer: BC Managed Care – PPO | Attending: Psychiatry | Admitting: Psychiatry

## 2022-01-06 ENCOUNTER — Other Ambulatory Visit: Payer: Self-pay

## 2022-01-06 ENCOUNTER — Other Ambulatory Visit: Payer: Self-pay | Admitting: Psychiatry

## 2022-01-06 ENCOUNTER — Emergency Department (HOSPITAL_COMMUNITY)
Admission: EM | Admit: 2022-01-06 | Discharge: 2022-01-06 | Discharge status: Against Medical Advice | Payer: BC Managed Care – PPO | Attending: Emergency Medicine | Admitting: Emergency Medicine

## 2022-01-06 DIAGNOSIS — Z01818 Encounter for other preprocedural examination: Secondary | ICD-10-CM | POA: Diagnosis not present

## 2022-01-06 DIAGNOSIS — Z5321 Procedure and treatment not carried out due to patient leaving prior to being seen by health care provider: Secondary | ICD-10-CM | POA: Insufficient documentation

## 2022-01-06 LAB — RESP PANEL BY RT-PCR (FLU A&B, COVID) ARPGX2
Influenza A by PCR: NEGATIVE
Influenza B by PCR: NEGATIVE
SARS Coronavirus 2 by RT PCR: NEGATIVE
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
## 2022-01-06 NOTE — H&P (Signed)
Behavioral Health Medical Screening Exam ? ?Stacy Poole is an 31 y.o. female who presented to [facility] with mother for assessment of delusions, psychosis, mood instability, hyper-verbal, and hallucinations.  ? ?On initial assessment patient reported being bought in by her mother for "drinking". She reports drinking over past few days and because she didn't answer her phone, mom "got mad and bought her in". States she currently works at [employer] from home and called out today to "sleep in".  ? ?Upon provider returning to assessment room, patient was in bathroom with staff talking illogically with loose associations.  ?When walking out patient told provider "we're dressed like twins. Cool! We're part of the [employer]". Patient then began speaking tangentially about being a "[place] spy" and "don't speak too loudly" due to "being followed". Speech pressured. She began talking increasingly delusional and illogically; loose associations which did not appear to be related to alcohol ingestion. Patient returned to assessment room and is now being recommended for inpatient treatment. While in assessment room patient continuously pressing emergency button; remains delusional.  ? ?Collateral: Ashari Llewellyn (mother) [phone] ?States she ran home. [name] has mental health issues and medications that she takes. Over past 2 weeks she's been under increased stress due to job training schedule changes; inconsistent sleep, delusional thoughts, paranoia, auditory hallucinations, responding to internal stimuli. Asked to stay at [facility] home Monday night, Tuesday her home. Today had a therapy appointment with [name] and during the appointment states therapist became concerned that she was "off" stated she needed to come into the hospital, called mom and asked to bring her into the hospital. Patient started drinking last night until this morning. Says she took triple amount of medication during the morning  (15 mg Zyprexa) and has been irrational.   ? ?Dr [name] 1101: states he has not seen patient to confirm or deny current state.  ? ?Total Time spent with patient: 20 minutes ? ?Psychiatric Specialty Exam: ?Physical Exam ?Vitals and nursing note reviewed.  ?HENT:  ?   Head: Normocephalic.  ?   Nose: Nose normal.  ?   Mouth/Throat:  ?   Mouth: Mucous membranes are moist.  ?Eyes:  ?   Pupils: Pupils are equal, round, and reactive to light.  ?Neck:  ?   Comments: Bruise noted around her neck ?Cardiovascular:  ?   Rate and Rhythm: Tachycardia present.  ?Pulmonary:  ?   Effort: Pulmonary effort is normal.  ?Musculoskeletal:     ?   General: Normal range of motion.  ?   Cervical back: Normal range of motion.  ?Skin: ?   General: Skin is dry.  ?Neurological:  ?   Mental Status: She is disoriented.  ?Psychiatric:     ?   Attention and Perception: She is inattentive. She perceives auditory hallucinations. She does not perceive visual hallucinations.     ?   Mood and Affect: Affect is labile.     ?   Speech: Speech is rapid and pressured and tangential.     ?   Behavior: Behavior is cooperative.     ?   Thought Content: Thought content is paranoid and delusional. Thought content does not include homicidal or suicidal ideation. Thought content does not include homicidal or suicidal plan.     ?   Cognition and Memory: Cognition is impaired.     ?   Judgment: Judgment is impulsive and inappropriate.  ? ?Review of Systems  ?Psychiatric/Behavioral:  Positive for sleep disturbance.   ?  Blood pressure (!) 134/99, pulse (!) 107, temperature 97.7 ?F (36.5 ?C), temperature source Oral, resp. rate 16, SpO2 99 %.There is no height or weight on file to calculate BMI. ?General Appearance: Disheveled ?Eye Contact:  Fair ?Speech:  Pressured ?Volume:  Normal ?Mood:   labile ?Affect:  Non-Congruent and Labile ?Thought Process:  Descriptions of Associations: Loose ?Orientation:  Full (Time, Place, and Person) ?Thought Content:  Illogical,  Delusions, Hallucinations: Auditory, Paranoid Ideation, and Tangential ?Suicidal Thoughts:  No ?Homicidal Thoughts:  No ?Memory:  Immediate;   Fair ?Recent;   Fair ?Judgement:  Impaired ?Insight:  Lacking ?Psychomotor Activity:  Increased ?Concentration: Concentration: Fair and Attention Span: Poor ?Recall:  Fair ?Fund of Knowledge:Good ?Language: Fair ?Akathisia:  NA ?Handed:  Right ?AIMS (if indicated):    ?Assets:  Physical Health ?Resilience ?Social Support ?Sleep:    ? ?Musculoskeletal: ?Strength & Muscle Tone: within normal limits ?Gait & Station: normal ?Patient leans: N/A ? ?Blood pressure (!) 134/99, pulse (!) 107, temperature 97.7 ?F (36.5 ?C), temperature source Oral, resp. rate 16, SpO2 99 %. ? ?Recommendations: ?Based on my evaluation, medical condition cannot be ruled and patient is being transferred to ED for medical clearance given recent ETOH intake and medication use. Patient being recommended for inpatient hospitalization. Consulted with [facility] Attending MD, [place]. Patient accepted to [facility], will require medical clearance.  Report called to Dr. Jacqulyn Bath [facility]) 1242. EMTALA completed ? ?Loletta Parish, NP ?01/06/2022, 10:59 AM ? ?

## 2022-01-06 NOTE — ED Notes (Signed)
Patient left out of Triage #2 and stated,"I don't want to stay and I will probably leave." Patient walked out of ED Lobby. ?

## 2022-01-10 ENCOUNTER — Encounter (HOSPITAL_COMMUNITY): Payer: Self-pay

## 2022-01-10 ENCOUNTER — Emergency Department (HOSPITAL_COMMUNITY)
Admission: EM | Admit: 2022-01-10 | Discharge: 2022-01-11 | Disposition: A | Discharge status: Home / Self Care | Payer: BC Managed Care – PPO | Attending: Emergency Medicine | Admitting: Emergency Medicine

## 2022-01-10 ENCOUNTER — Other Ambulatory Visit: Payer: Self-pay

## 2022-01-10 ENCOUNTER — Ambulatory Visit (HOSPITAL_COMMUNITY)
Admission: EM | Admit: 2022-01-10 | Discharge: 2022-01-10 | Disposition: A | Discharge status: Other Acute Inpatient Hospital | Payer: BC Managed Care – PPO

## 2022-01-10 DIAGNOSIS — F1910 Other psychoactive substance abuse, uncomplicated: Secondary | ICD-10-CM | POA: Diagnosis not present

## 2022-01-10 DIAGNOSIS — R079 Chest pain, unspecified: Secondary | ICD-10-CM | POA: Diagnosis present

## 2022-01-10 DIAGNOSIS — R519 Headache, unspecified: Secondary | ICD-10-CM | POA: Insufficient documentation

## 2022-01-10 DIAGNOSIS — R109 Unspecified abdominal pain: Secondary | ICD-10-CM | POA: Diagnosis not present

## 2022-01-10 LAB — COMPREHENSIVE METABOLIC PANEL
ALT: 29 U/L (ref 0–44)
AST: 28 U/L (ref 15–41)
Albumin: 4.2 g/dL (ref 3.5–5.0)
Alkaline Phosphatase: 58 U/L (ref 38–126)
Anion gap: 9 (ref 5–15)
BUN: 5 mg/dL — ABNORMAL LOW (ref 6–20)
CO2: 24 mmol/L (ref 22–32)
Calcium: 9.2 mg/dL (ref 8.9–10.3)
Chloride: 105 mmol/L (ref 98–111)
Creatinine, Ser: 0.87 mg/dL (ref 0.44–1.00)
GFR, Estimated: >60 mL/min (ref >60)
Glucose, Bld: 87 mg/dL (ref 70–99)
Potassium: 3.9 mmol/L (ref 3.5–5.1)
Sodium: 138 mmol/L (ref 135–145)
Total Bilirubin: 0.4 mg/dL (ref 0.3–1.2)
Total Protein: 7.2 g/dL (ref 6.5–8.1)

## 2022-01-10 LAB — URINALYSIS, ROUTINE W REFLEX MICROSCOPIC
Bilirubin Urine: NEGATIVE
Glucose, UA: NEGATIVE mg/dL
Hgb urine dipstick: NEGATIVE
Ketones, ur: NEGATIVE mg/dL
Leukocytes,Ua: NEGATIVE
Nitrite: NEGATIVE
Protein, ur: NEGATIVE mg/dL
Specific Gravity, Urine: 1.002 — ABNORMAL LOW (ref 1.005–1.030)
pH: 6 (ref 5.0–8.0)

## 2022-01-10 LAB — CBC
HCT: 43.2 % (ref 36.0–46.0)
Hemoglobin: 14.2 g/dL (ref 12.0–15.0)
MCH: 29.7 pg (ref 26.0–34.0)
MCHC: 32.9 g/dL (ref 30.0–36.0)
MCV: 90.4 fL (ref 80.0–100.0)
Platelets: 291 10*3/uL (ref 150–400)
RBC: 4.78 MIL/uL (ref 3.87–5.11)
RDW: 14 % (ref 11.5–15.5)
WBC: 8.5 10*3/uL (ref 4.0–10.5)
nRBC: 0 % (ref 0.0–0.2)

## 2022-01-10 LAB — TROPONIN I (HIGH SENSITIVITY): Troponin I (High Sensitivity): 5 ng/L (ref <18)

## 2022-01-10 LAB — I-STAT BETA HCG BLOOD, ED (MC, WL, AP ONLY): I-stat hCG, quantitative: <5 m[IU]/mL (ref <5)

## 2022-01-10 LAB — LIPASE, BLOOD: Lipase: 32 U/L (ref 11–51)

## 2022-01-10 LAB — ETHANOL: Alcohol, Ethyl (B): 133 mg/dL — ABNORMAL HIGH (ref <10)

## 2022-01-10 NOTE — ED Triage Notes (Signed)
Pt presents to [facility] escorted by [employer] voluntarily. Pt states her mother called the police on her because she is out to get her. Pt is singing,making sounds, talking to herself. Pt is experiencing manic behavior. Pt is yelling "I need coffee, I need coffee".Pt denies SI/HI and AVH. ?

## 2022-01-10 NOTE — ED Notes (Signed)
Pt belongings returned to pt, pt stated she is leaving, triage RN and PA notified.  ?

## 2022-01-10 NOTE — ED Notes (Signed)
Pt transferred to [facility] ED d/t c/o chest pain, when asked if it was ok to let mother know of the transfer pt states NO. ?

## 2022-01-10 NOTE — ED Provider Triage Note (Signed)
Emergency Medicine Provider Triage Evaluation Note ? ?Stacy Poole , a 31 y.o. female  was evaluated in triage.  Pt complains of numerous complaints. ? ?She tells me that she also is having head pain after she fell down some steps at about 10 days ago.  She also tells me that she has abdominal pain but then later tells me she does not have abdominal pain.  She also tells me she has chest pain she states is been ongoing for quite some time in  her words ? ?Per nurse practitioner of psychiatry note :  ? ?"History of Present illness: Stacy Poole is a 31 y.o. female brought to [facility] voluntarily by [facility] police.  Patient states she told the police she needed to go to the emergency room because she needed help.  Patient is complaining of chest pain. Yelling and screaming that she needs help." ? ? ? ? ?Review of Systems  ?Positive: CP, Headache, abd pain ?Negative: Fever  ? ?Physical Exam  ?There were no vitals taken for this visit. ?Gen:   Awake, yelling, agitated but does answer questions appropriate follow commands with some gentle direction. ?Resp:  Normal effort  ?MSK:   Moves extremities without difficulty  ?Other:  No focal abdominal tenderness. ? ?Medical Decision Making  ?Medically screening exam initiated at 9:08 PM.  Appropriate orders placed.  Stacy Poole was informed that the remainder of the evaluation will be completed by another provider, this initial triage assessment does not replace that evaluation, and the importance of remaining in the ED until their evaluation is complete. ? ?It is difficult to evaluate patient because she is screaming and complaining of numerous to complaints.  When asked if she has abdominal pain she states now however during my examination she changes her mind and tells me that she does have abdominal pain.  She then reacts extremely to me palpating the right lower quadrant of her abdomen however on repalpation she does not react to pain.  Very inconsistent  with her exam. ? ?I have very high suspicion that her presentation is primarily psychiatric and related to her alcohol use.  However pancreatitis could certainly cause some of the symptoms.  Will check abdominal labs and chest x-ray, EKG and troponin. ?  ?Gailen Shelter, [place] ?01/10/22 2112 ? ?

## 2022-01-10 NOTE — ED Triage Notes (Signed)
Pt send here by [facility] for medical clearance. Pt reports cp . [facility] states that is the reason they sent her here ?

## 2022-01-10 NOTE — ED Provider Notes (Addendum)
Behavioral Health Urgent Care Medical Screening Exam ? ?Patient Name: Stacy Poole ?MRN: [id number] ?Date of Evaluation: 01/10/22 ?Chief Complaint:   ?Diagnosis:  ?Final diagnoses:  ?Chest pain, unspecified type  ?Polysubstance abuse (HCC)  ? ? ?History of Present illness: Stacy Poole is a 31 y.o. female brought to [facility] voluntarily by [facility] police.  Patient states she told the police she needed to go to the emergency room because she needed help.  Patient is complaining of chest pain. Yelling and screaming that she needs help.   ? ? ?Psychiatric Specialty Exam ? ?Presentation  ?General Appearance:Appropriate for Environment ? ?Eye Contact:Fair ? ?Speech:Clear and Coherent ? ?Speech Volume:Normal ? ?Handedness:Right ? ? ?Mood and Affect  ?Mood:Euthymic ? ?Affect:Blunt ? ? ?Thought Process  ?Thought Processes:Coherent ? ?Descriptions of Associations:Intact ? ?Orientation:Full (Time, Place and Person) ? ?Thought Content:WDL ? Diagnosis of Schizophrenia or Schizoaffective disorder in past: Yes ? Duration of Psychotic Symptoms: Less than six months ? Hallucinations:None ? ?Ideas of Reference:None ? ?Suicidal Thoughts:No ? ?Homicidal Thoughts:No ? ? ?Sensorium  ?Memory:Immediate Fair; Remote Fair; Recent Fair ? ?Judgment:Fair ? ?Insight:Fair ? ? ?Executive Functions  ?Concentration:Fair ? ?Attention Span:Fair ? ?Recall:Fair ? ?Fund of Knowledge:Fair ? ?Language:Fair ? ? ?Psychomotor Activity  ?Psychomotor Activity:Normal ? ? 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 ? ?Sleep  ?Sleep:Fair ? ?Number of hours: 4.75 ? ? ?No data recorded ? ?Physical Exam: ?Physical Exam ?Vitals reviewed. Nursing note reviewed: Patient complaining of chest pain. ?Cardiovascular:  ?   Rate and Rhythm: Tachycardia present.  ? ?Review of Systems  ?Unable to perform ROS: Acuity of  condition  ?Cardiovascular:  Positive for chest pain.  ?Psychiatric/Behavioral:  Positive for substance abuse. Negative for hallucinations. Suicidal ideas: Denies.The patient is nervous/anxious.   ?Blood pressure (!) 172/130, pulse (!) 141, temperature 98.7 ?F (37.1 ?C), temperature source Oral, resp. rate 20, SpO2 98 %. There is no height or weight on file to calculate BMI. ? ?Musculoskeletal: ?Strength & Muscle Tone: within normal limits ?Gait & Station: normal ?Patient leans: N/A ? ? ?[facility] MSE Discharge Disposition for Follow up and Recommendations: ?Based on my evaluation the patient appears to have an emergency medical condition for which I recommend the patient be transferred to the emergency department for further evaluation.  ? ? ?Patient to be transferred to ED related to complaints of chest pain.  Spoke to Dr. Posey Rea via phone and sent secure message.  Patient doesn't require psychiatric hospitalization and can be discharged once medically stable.   ? ? ? ?Secure Message to Dr. Posey Rea informing:Patient has history of polysubstance abuse but at this time she denies suicidal/homicidal ideation, psychosis, and paranoia.  She doesn't require psychiatric hospitalization and once medically stable she can be discharged.  No need for TTS consult.  Thanks.  Complaint of meth use, chest pain, and not feeling well. VS abnormal tachy with elevated blood pressure.   ?Herlinda Heady, NP ?01/10/2022, 8:03 PM ? ?

## 2022-01-13 ENCOUNTER — Emergency Department (HOSPITAL_COMMUNITY)
Admission: EM | Admit: 2022-01-13 | Discharge: 2022-01-15 | Disposition: A | Discharge status: Home / Self Care | Payer: BC Managed Care – PPO | Attending: Emergency Medicine | Admitting: Emergency Medicine

## 2022-01-13 DIAGNOSIS — F419 Anxiety disorder, unspecified: Secondary | ICD-10-CM | POA: Diagnosis not present

## 2022-01-13 DIAGNOSIS — F22 Delusional disorders: Secondary | ICD-10-CM

## 2022-01-13 DIAGNOSIS — F29 Unspecified psychosis not due to a substance or known physiological condition: Secondary | ICD-10-CM | POA: Insufficient documentation

## 2022-01-13 DIAGNOSIS — R443 Hallucinations, unspecified: Secondary | ICD-10-CM | POA: Diagnosis not present

## 2022-01-13 DIAGNOSIS — F1510 Other stimulant abuse, uncomplicated: Secondary | ICD-10-CM | POA: Diagnosis not present

## 2022-01-13 DIAGNOSIS — F250 Schizoaffective disorder, bipolar type: Secondary | ICD-10-CM | POA: Insufficient documentation

## 2022-01-13 DIAGNOSIS — Z046 Encounter for general psychiatric examination, requested by authority: Secondary | ICD-10-CM | POA: Insufficient documentation

## 2022-01-13 DIAGNOSIS — Z79899 Other long term (current) drug therapy: Secondary | ICD-10-CM | POA: Diagnosis not present

## 2022-01-13 DIAGNOSIS — F1010 Alcohol abuse, uncomplicated: Secondary | ICD-10-CM | POA: Insufficient documentation

## 2022-01-13 LAB — RAPID URINE DRUG SCREEN, HOSP PERFORMED
Amphetamines: POSITIVE — AB
Barbiturates: NOT DETECTED
Benzodiazepines: NOT DETECTED
Cocaine: NOT DETECTED
Opiates: NOT DETECTED
Tetrahydrocannabinol: POSITIVE — AB

## 2022-01-13 LAB — COMPREHENSIVE METABOLIC PANEL
ALT: 31 U/L (ref 0–44)
AST: 29 U/L (ref 15–41)
Albumin: 4.3 g/dL (ref 3.5–5.0)
Alkaline Phosphatase: 67 U/L (ref 38–126)
Anion gap: 12 (ref 5–15)
BUN: 11 mg/dL (ref 6–20)
CO2: 25 mmol/L (ref 22–32)
Calcium: 9 mg/dL (ref 8.9–10.3)
Chloride: 99 mmol/L (ref 98–111)
Creatinine, Ser: 0.93 mg/dL (ref 0.44–1.00)
GFR, Estimated: >60 mL/min (ref >60)
Glucose, Bld: 101 mg/dL — ABNORMAL HIGH (ref 70–99)
Potassium: 3.6 mmol/L (ref 3.5–5.1)
Sodium: 136 mmol/L (ref 135–145)
Total Bilirubin: 0.4 mg/dL (ref 0.3–1.2)
Total Protein: 7.6 g/dL (ref 6.5–8.1)

## 2022-01-13 LAB — I-STAT BETA HCG BLOOD, ED (MC, WL, AP ONLY): I-stat hCG, quantitative: <5 m[IU]/mL (ref <5)

## 2022-01-13 LAB — CBC WITH DIFFERENTIAL/PLATELET
Abs Immature Granulocytes: 0.02 10*3/uL (ref 0.00–0.07)
Basophils Absolute: 0 10*3/uL (ref 0.0–0.1)
Basophils Relative: 0 %
Eosinophils Absolute: 0 10*3/uL (ref 0.0–0.5)
Eosinophils Relative: 1 %
HCT: 41.8 % (ref 36.0–46.0)
Hemoglobin: 14.3 g/dL (ref 12.0–15.0)
Immature Granulocytes: 0 %
Lymphocytes Relative: 27 %
Lymphs Abs: 2.1 10*3/uL (ref 0.7–4.0)
MCH: 30.6 pg (ref 26.0–34.0)
MCHC: 34.2 g/dL (ref 30.0–36.0)
MCV: 89.3 fL (ref 80.0–100.0)
Monocytes Absolute: 0.5 10*3/uL (ref 0.1–1.0)
Monocytes Relative: 7 %
Neutro Abs: 5 10*3/uL (ref 1.7–7.7)
Neutrophils Relative %: 65 %
Platelets: 278 10*3/uL (ref 150–400)
RBC: 4.68 MIL/uL (ref 3.87–5.11)
RDW: 14.6 % (ref 11.5–15.5)
WBC: 7.7 10*3/uL (ref 4.0–10.5)
nRBC: 0 % (ref 0.0–0.2)

## 2022-01-13 LAB — ETHANOL: Alcohol, Ethyl (B): 126 mg/dL — ABNORMAL HIGH (ref <10)

## 2022-01-13 MED ORDER — ZIPRASIDONE MESYLATE 20 MG IM SOLR: 20.0000 mg | INTRAMUSCULAR | Status: DC | PRN

## 2022-01-13 MED ORDER — LORAZEPAM 1 MG PO TABS: 0.0000 mg | ORAL_TABLET | Freq: Two times a day (BID) | ORAL | Status: DC

## 2022-01-13 MED ORDER — HYDROXYZINE HCL 25 MG PO TABS
25.0000 mg | ORAL_TABLET | Freq: Three times a day (TID) | ORAL | Status: DC | PRN
  Administered 2022-01-13 – 2022-01-14 (×3): 25 mg via ORAL
  Filled 2022-01-13 (×3): qty 1

## 2022-01-13 MED ORDER — LORAZEPAM 1 MG PO TABS
0.0000 mg | ORAL_TABLET | Freq: Four times a day (QID) | ORAL | Status: DC
  Administered 2022-01-13 – 2022-01-14 (×3): 1 mg via ORAL
  Administered 2022-01-14: 2 mg via ORAL
  Administered 2022-01-15: 1 mg via ORAL
  Filled 2022-01-13 (×3): qty 1
  Filled 2022-01-13: qty 2
  Filled 2022-01-13 (×2): qty 1

## 2022-01-13 MED ORDER — ACETAMINOPHEN 325 MG PO TABS
650.0000 mg | ORAL_TABLET | ORAL | Status: DC | PRN
  Administered 2022-01-13: 650 mg via ORAL
  Filled 2022-01-13: qty 2

## 2022-01-13 MED ORDER — HALOPERIDOL LACTATE 5 MG/ML IJ SOLN: 5.0000 mg | Freq: Once | INTRAMUSCULAR | Status: DC | PRN

## 2022-01-13 MED ORDER — LORAZEPAM 2 MG/ML IJ SOLN: 2.0000 mg | Freq: Once | INTRAMUSCULAR | Status: DC | PRN

## 2022-01-13 MED ORDER — TRAZODONE HCL 100 MG PO TABS
100.0000 mg | ORAL_TABLET | Freq: Every evening | ORAL | Status: DC | PRN
  Administered 2022-01-13 – 2022-01-14 (×2): 100 mg via ORAL
  Filled 2022-01-13 (×2): qty 1

## 2022-01-13 MED ORDER — OLANZAPINE 5 MG PO TABS
5.0000 mg | ORAL_TABLET | Freq: Every day | ORAL | Status: DC
  Administered 2022-01-13: 5 mg via ORAL
  Filled 2022-01-13: qty 1

## 2022-01-13 MED ORDER — SODIUM CHLORIDE 0.9 % IV BOLUS: 1000.0000 mL | Freq: Once | INTRAVENOUS | Status: DC

## 2022-01-13 MED ORDER — ALUM & MAG HYDROXIDE-SIMETH 200-200-20 MG/5ML PO SUSP: 30.0000 mL | Freq: Four times a day (QID) | ORAL | Status: DC | PRN

## 2022-01-13 MED ORDER — DIPHENHYDRAMINE HCL 50 MG/ML IJ SOLN: 50.0000 mg | Freq: Once | INTRAMUSCULAR | Status: DC | PRN

## 2022-01-13 MED ORDER — CARBAMAZEPINE ER 200 MG PO TB12
200.0000 mg | ORAL_TABLET | Freq: Two times a day (BID) | ORAL | Status: DC
  Administered 2022-01-13 – 2022-01-15 (×4): 200 mg via ORAL
  Filled 2022-01-13 (×5): qty 1

## 2022-01-13 MED ORDER — LORAZEPAM 2 MG/ML IJ SOLN: 0.0000 mg | Freq: Two times a day (BID) | INTRAMUSCULAR | Status: DC

## 2022-01-13 MED ORDER — OLANZAPINE 10 MG PO TBDP
10.0000 mg | ORAL_TABLET | Freq: Three times a day (TID) | ORAL | Status: DC | PRN
  Administered 2022-01-13: 10 mg via ORAL
  Filled 2022-01-13: qty 1

## 2022-01-13 MED ORDER — THIAMINE HCL 100 MG PO TABS
100.0000 mg | ORAL_TABLET | Freq: Every day | ORAL | Status: DC
  Administered 2022-01-13 – 2022-01-15 (×3): 100 mg via ORAL
  Filled 2022-01-13 (×3): qty 1

## 2022-01-13 MED ORDER — THIAMINE HCL 100 MG/ML IJ SOLN: 100.0000 mg | Freq: Every day | INTRAMUSCULAR | Status: DC

## 2022-01-13 MED ORDER — LORAZEPAM 2 MG/ML IJ SOLN: 0.0000 mg | Freq: Four times a day (QID) | INTRAMUSCULAR | Status: DC

## 2022-01-13 MED ORDER — LORAZEPAM 1 MG PO TABS
1.0000 mg | ORAL_TABLET | ORAL | Status: AC | PRN
  Administered 2022-01-13: 1 mg via ORAL
  Filled 2022-01-13: qty 1

## 2022-01-13 NOTE — ED Notes (Signed)
Pt coming out of room, asking for geodon, pt attempting to make herself vomit, states she cannot breathe. Informed pt her 02 sat was good, she is okay and reminded pt that she still needs to talk to TTS.  Pt redirected, straightens up and talks normally stating, "you're right, you're right, can I get another sandwich".   ?

## 2022-01-13 NOTE — ED Provider Notes (Signed)
?[facility]-EMERGENCY DEPT ?Provider Note ? ? ?CSN: [id number] ?Arrival date & time: 01/13/22  1350 ? ?  ? ?History ? ?Chief Complaint  ?Patient presents with  ? IVC  ? Psychosis  ? ? ?Stacy Poole is a 31 y.o. female.  Presents to ER due to concern for IVC.  Family checked on patient, noted to be acting abnormally.  History obtained from [facility] officer.  Reportedly someone is completing an IVC right now.  Patient states that few hours ago she did some meth.  Feels very intoxicated right now.  She denies thoughts of hurting herself or hurting others.  States that she seeks to protect others. ? ?Completed chart review, reviewed last consult note from psychiatry, on 09/16/2021 ? ?Talked with Christiane Ha her friend via phone - acting somewhat strange lately, not aware of events from today.  ? ?Per IVC paperwork: Pt making bizarre, erratic statements during triage such as "I work for [employer] right now... The crips are telling me to kill myself... The Russians are about to bomb [place]". Pt endorses recent meth and alcohol use.  ? ?History is limited due to patient's psychiatric condition. ? ?HPI ? ?  ? ?Home Medications ?Prior to Admission medications   ?Medication Sig Start Date End Date Taking? Authorizing Provider  ?carbamazepine (TEGRETOL XR) 200 MG 12 hr tablet Take 1 tablet (200 mg total) by mouth 2 (two) times daily. 09/05/21 10/05/21  Roselle Locus, MD  ?cloNIDine (CATAPRES) 0.1 MG tablet Take 1 tablet (0.1 mg total) by mouth 2 (two) times daily. ?Patient not taking: Reported on 09/15/2021 09/05/21 10/05/21  Roselle Locus, MD  ?famotidine (PEPCID) 20 MG tablet Take 1 tablet (20 mg total) by mouth daily. ?Patient not taking: Reported on 08/31/2021 07/05/21 08/04/21  Park Pope, MD  ?hydrOXYzine (ATARAX) 25 MG tablet Take 1 tablet (25 mg total) by mouth 3 (three) times daily as needed for anxiety. ?Patient not taking: Reported on 09/15/2021 09/05/21   Roselle Locus, MD  ?loratadine  (CLARITIN) 10 MG tablet Take 1 tablet (10 mg total) by mouth daily. ?Patient not taking: Reported on 08/31/2021 07/05/21 08/04/21  Park Pope, MD  ?OLANZapine (ZYPREXA) 5 MG tablet Take 1 tablet (5 mg total) by mouth at bedtime. 09/05/21   Roselle Locus, MD  ?traZODone (DESYREL) 100 MG tablet Take 1 tablet (100 mg total) by mouth at bedtime as needed for sleep. 07/04/21 09/15/21  Park Pope, MD  ?Vitamin D3 (VITAMIN D) 25 MCG tablet Take 1 tablet (1,000 Units total) by mouth daily. 09/06/21   Roselle Locus, MD  ?   ? ?Allergies    ?Lamictal [lamotrigine], Beef-derived products, Chicken protein, Fish-derived products, Pork-derived products, and Tetracyclines & related   ? ?Review of Systems   ?Review of Systems  ?Unable to perform ROS: Psychiatric disorder  ? ?Physical Exam ?Updated Vital Signs ?Ht 5\' 3"  (1.6 m)   Wt 77.1 kg   BMI 30.11 kg/m?  ?Physical Exam ?Vitals and nursing note reviewed.  ?Constitutional:   ?   General: She is not in acute distress. ?   Appearance: She is well-developed.  ?HENT:  ?   Head: Normocephalic and atraumatic.  ?Eyes:  ?   Conjunctiva/sclera: Conjunctivae normal.  ?Cardiovascular:  ?   Rate and Rhythm: Normal rate and regular rhythm.  ?   Heart sounds: No murmur heard. ?Pulmonary:  ?   Effort: Pulmonary effort is normal. No respiratory distress.  ?   Breath sounds: Normal breath  sounds.  ?Abdominal:  ?   Palpations: Abdomen is soft.  ?   Tenderness: There is no abdominal tenderness.  ?Musculoskeletal:     ?   General: No swelling.  ?   Cervical back: Neck supple.  ?Skin: ?   General: Skin is warm and dry.  ?   Capillary Refill: Capillary refill takes less than 2 seconds.  ?Neurological:  ?   General: No focal deficit present.  ?   Mental Status: She is alert.  ?Psychiatric:     ?   Mood and Affect: Mood normal.  ? ? ?ED Results / Procedures / Treatments   ?Labs ?(all labs ordered are listed, but only abnormal results are displayed) ?Labs Reviewed  ?COMPREHENSIVE METABOLIC  PANEL - Abnormal; Notable for the following components:  ?    Result Value  ? Glucose, Bld 101 (*)   ? All other components within normal limits  ?ETHANOL - Abnormal; Notable for the following components:  ? Alcohol, Ethyl (B) 126 (*)   ? All other components within normal limits  ?RAPID URINE DRUG SCREEN, HOSP PERFORMED - Abnormal; Notable for the following components:  ? Amphetamines POSITIVE (*)   ? Tetrahydrocannabinol POSITIVE (*)   ? All other components within normal limits  ?CBC WITH DIFFERENTIAL/PLATELET  ?I-STAT BETA HCG BLOOD, ED ([facility], [facility], [facility] ONLY)  ? ? ?EKG ?None ? ?Radiology ?No results found. ? ?Procedures ?Procedures  ? ? ?Medications Ordered in ED ?Medications  ?OLANZapine zydis (ZYPREXA) disintegrating tablet 10 mg (has no administration in time range)  ?  And  ?LORazepam (ATIVAN) tablet 1 mg (has no administration in time range)  ?  And  ?ziprasidone (GEODON) injection 20 mg (has no administration in time range)  ?acetaminophen (TYLENOL) tablet 650 mg (has no administration in time range)  ?alum & mag hydroxide-simeth (MAALOX/MYLANTA) 200-200-20 MG/5ML suspension 30 mL (has no administration in time range)  ?carbamazepine (TEGRETOL XR) 12 hr tablet 200 mg (200 mg Oral Not Given 01/13/22 1504)  ?hydrOXYzine (ATARAX) tablet 25 mg (has no administration in time range)  ?OLANZapine (ZYPREXA) tablet 5 mg (has no administration in time range)  ?traZODone (DESYREL) tablet 100 mg (has no administration in time range)  ? ? ?ED Course/ Medical Decision Making/ A&P ?Clinical Course as of 01/13/22 1525  ?Fri Jan 13, 2022  ?1410 [phone] Okey Regal, mom - no answer x 2, straight to VM [RD]  ?76 Called friend Christiane Ha listed as emergency contact, he was with patient last night, not aware what was happening today.  [RD]  ?  ?Clinical Course User Index ?[RD] Milagros Loll, MD  ? ?                        ?Medical Decision Making ?Amount and/or Complexity of Data Reviewed ?Labs: ordered. ? ?Risk ?OTC  drugs. ?Prescription drug management. ? ? ?31 year old lady presented to the emergency room due to concern for abnormal behavior.  Per the [employer] officer at bedside someone is completing IVC paperwork at this time and a copy of that paperwork will be available shortly.  Will place orders for basic labs while awaiting this further information.  Patient does appear to be intoxicated but is cooperative and pleasant at present. ? ?Additional information was obtained from the IVC.  Patient was apparently having significant delusions, scattered thoughts, hallucinations including auditory hallucinations suggesting that she kill herself.  Have completed first examination. ? ?While awaiting basic labs, signed out to Dr. Silverio Lay.  Some  tachycardia noted in initial vital signs, started IV fluids. ? ? ? ? ? ? ? ?Final Clinical Impression(s) / ED Diagnoses ?Final diagnoses:  ?Hallucinations  ?Paranoid delusion (HCC)  ?Psychosis, unspecified psychosis type (HCC)  ?Methamphetamine abuse (HCC)  ? ? ?Rx / DC Orders ?ED Discharge Orders   ? ? None  ? ?  ? ? ?  ?Milagros Lollykstra, Carys Malina S, MD ?01/13/22 1525 ? ?

## 2022-01-13 NOTE — ED Notes (Signed)
Pt changed into purple scrubs. All belongings placed in lockers for [address]. Pt had two bags worth of belongings.  ?

## 2022-01-13 NOTE — ED Triage Notes (Signed)
Pt arrives via [employer] for psychiatric issues. [employer] currently working on [employer] paperwork. Pt making bizarre, erratic statements during triage such as "I work for [employer] right now... The crips are telling me to kill myself... The Russians are about to [place]". Pt endorses recent meth and alcohol use.  ?

## 2022-01-13 NOTE — BH Assessment (Signed)
@  14:30 Labs pending.  Patient is not medically cleared for TTS Assessment. ?

## 2022-01-13 NOTE — ED Provider Notes (Signed)
?  Physical Exam  ?BP (!) 161/109 (BP Location: Left Arm)   Pulse (!) 112   Resp 18   Ht 5\' 3"  (1.6 m)   Wt 77.1 kg   SpO2 100%   BMI 30.11 kg/m?  ? ?Physical Exam ? ?Procedures  ?Procedures ? ?ED Course / MDM  ? ?Clinical Course as of 01/13/22 1631  ?Fri Jan 13, 2022  ?1410 [phone] 04-28-1999, mom - no answer x 2, straight to VM [RD]  ?70 Called friend 141 listed as emergency contact, he was with patient last night, not aware what was happening today.  [RD]  ?  ?Clinical Course User Index ?[RD] Christiane Ha, MD  ? ?Medical Decision Making ?Care assumed at 3 PM.  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  Patient was tachycardic but she is also agitated.  Labs pending at signout ? ?4:31 PM ?Labs unremarkable and patient medically clear.  Her heart rate is down to 110 after given some Ativan and Zyprexa.  Tachycardia likely secondary to her meth use and also alcohol use.  Psych consult pending ? ?Amount and/or Complexity of Data Reviewed ?Labs: ordered. ? ?Risk ?OTC drugs. ?Prescription drug management. ? ? ? ? ? ? ? ?  ?[place], MD ?01/13/22 1632 ? ?

## 2022-01-14 MED ORDER — DIPHENHYDRAMINE HCL 25 MG PO CAPS
25.0000 mg | ORAL_CAPSULE | Freq: Once | ORAL | Status: AC
  Administered 2022-01-14: 25 mg via ORAL
  Filled 2022-01-14: qty 1

## 2022-01-14 MED ORDER — OLANZAPINE 10 MG PO TABS
10.0000 mg | ORAL_TABLET | Freq: Every day | ORAL | Status: DC
  Administered 2022-01-14: 10 mg via ORAL
  Filled 2022-01-14: qty 1
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
## 2022-01-14 NOTE — ED Notes (Signed)
TTS in room with pt  

## 2022-01-14 NOTE — ED Provider Notes (Signed)
Emergency Medicine Observation Re-evaluation Note ? ?Stacy Poole is a 31 y.o. female, seen on rounds today.  Pt initially presented to the ED for complaints of IVC, Psychosis, and Addiction Problem ?Currently, the patient is awaiting reassessment by behavioral health.  Patient was IVC for psychosis and substance abuse issues. ? ?Physical Exam  ?BP (!) 140/94 (BP Location: Left Arm)   Pulse 98   Temp 97.8 ?F (36.6 ?C) (Oral)   Resp 20   Ht 1.6 m (5\' 3" )   Wt 77.1 kg   SpO2 99%   BMI 30.11 kg/m?  ?Physical Exam ?Cardiovascular:  ?   Rate and Rhythm: Normal rate.  ?Pulmonary:  ?   Effort: No respiratory distress.  ?Neurological:  ?   General: No focal deficit present.  ?   Mental Status: She is alert.  ? ? ? ?ED Course / MDM  ?EKG:  ? ?I have reviewed the labs performed to date as well as medications administered while in observation.  Recent changes in the last 24 hours include no or any acute events this morning.  It was noted that yesterday afternoon the patient was having some bizarre statements that she was working from [place] a [place] about a bomb in . ? ?Plan  ?Current plan is for reevaluation today by behavioral health. ?Stacy Poole is under involuntary commitment. ?  ? ?  ?[name], MD ?01/14/22 1103 ? ?

## 2022-01-15 DIAGNOSIS — R443 Hallucinations, unspecified: Secondary | ICD-10-CM

## 2022-01-15 MED ORDER — THIAMINE HCL 100 MG PO TABS: 100.0000 mg | ORAL_TABLET | Freq: Every day | ORAL | 0 refills | Status: AC

## 2022-01-15 MED ORDER — CARBAMAZEPINE ER 200 MG PO TB12: 200.0000 mg | ORAL_TABLET | Freq: Two times a day (BID) | ORAL | 2 refills | Status: DC

## 2022-01-15 MED ORDER — OLANZAPINE 10 MG PO TABS: 10.0000 mg | ORAL_TABLET | Freq: Every day | ORAL | 0 refills | Status: DC
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
## 2022-01-15 NOTE — Discharge Instructions (Signed)
Continue taking olanzapine 10 mg at night continue your Tegretol and thiamine ?

## 2022-01-15 NOTE — ED Provider Notes (Addendum)
Emergency Medicine Observation Re-evaluation Note ? ?Stacy Poole is a 31 y.o. female, seen on rounds today.  Pt initially presented to the ED for complaints of IVC, Psychosis, and Addiction Problem ?Currently, the patient is resting. ? ?Physical Exam  ?BP 124/85 (BP Location: Left Arm)   Pulse 78   Temp (!) 97.4 ?F (36.3 ?C) (Oral)   Resp 18   Ht 1.6 m (5\' 3" )   Wt 77.1 kg   SpO2 98%   BMI 30.11 kg/m?  ?Physical Exam ?General: resting ?Cardiac: regular rate ? ? ?ED Course / MDM  ?EKG:  ? ?I have reviewed the labs performed to date as well as medications administered while in observation.  Recent changes in the last 24 hours include no acute events. ? ?Plan  ?Current plan is for re-eval by psychiatry. ?NIKKY DUBA is under involuntary commitment. ?  ? ?  ?[name], MD ?01/15/22 [phone] ? ?Patient was reassessed by psychiatry team this morning.  She is stable for discharge and outpatient management ?  ?[id number], MD ?01/15/22 1043 ? ?

## 2022-01-31 ENCOUNTER — Telehealth (HOSPITAL_COMMUNITY): Payer: Self-pay

## 2022-01-31 NOTE — BH Assessment (Signed)
Care Management - [facility]UC Follow Up Discharges   Patient transfered to [facility] ED due to chest pain, per chart review.

## 2022-04-09 ENCOUNTER — Encounter (HOSPITAL_COMMUNITY): Payer: Self-pay

## 2022-04-09 ENCOUNTER — Emergency Department (HOSPITAL_COMMUNITY)
Admission: EM | Admit: 2022-04-09 | Discharge: 2022-04-10 | Disposition: A | Discharge status: Home / Self Care | Payer: BC Managed Care – PPO | Attending: Emergency Medicine | Admitting: Emergency Medicine

## 2022-04-09 ENCOUNTER — Ambulatory Visit (HOSPITAL_COMMUNITY)
Admission: EM | Admit: 2022-04-09 | Discharge: 2022-04-09 | Disposition: A | Discharge status: Other Acute Inpatient Hospital | Payer: BC Managed Care – PPO

## 2022-04-09 ENCOUNTER — Other Ambulatory Visit: Payer: Self-pay

## 2022-04-09 DIAGNOSIS — R45851 Suicidal ideations: Secondary | ICD-10-CM | POA: Diagnosis not present

## 2022-04-09 DIAGNOSIS — R443 Hallucinations, unspecified: Secondary | ICD-10-CM | POA: Insufficient documentation

## 2022-04-09 DIAGNOSIS — R451 Restlessness and agitation: Secondary | ICD-10-CM | POA: Diagnosis not present

## 2022-04-09 DIAGNOSIS — F22 Delusional disorders: Secondary | ICD-10-CM | POA: Diagnosis present

## 2022-04-09 DIAGNOSIS — F209 Schizophrenia, unspecified: Secondary | ICD-10-CM | POA: Diagnosis not present

## 2022-04-09 DIAGNOSIS — R000 Tachycardia, unspecified: Secondary | ICD-10-CM | POA: Diagnosis not present

## 2022-04-09 DIAGNOSIS — Z20822 Contact with and (suspected) exposure to covid-19: Secondary | ICD-10-CM | POA: Insufficient documentation

## 2022-04-09 HISTORY — DX: Schizophrenia, unspecified: F20.9

## 2022-04-09 HISTORY — DX: Bipolar disorder, unspecified: F31.9

## 2022-04-09 LAB — COMPREHENSIVE METABOLIC PANEL
ALT: 18 U/L (ref 0–44)
AST: 26 U/L (ref 15–41)
Albumin: 4.2 g/dL (ref 3.5–5.0)
Alkaline Phosphatase: 55 U/L (ref 38–126)
Anion gap: 10 (ref 5–15)
BUN: 13 mg/dL (ref 6–20)
CO2: 24 mmol/L (ref 22–32)
Calcium: 9.5 mg/dL (ref 8.9–10.3)
Chloride: 104 mmol/L (ref 98–111)
Creatinine, Ser: 0.91 mg/dL (ref 0.44–1.00)
GFR, Estimated: >60 mL/min (ref >60)
Glucose, Bld: 100 mg/dL — ABNORMAL HIGH (ref 70–99)
Potassium: 3.3 mmol/L — ABNORMAL LOW (ref 3.5–5.1)
Sodium: 138 mmol/L (ref 135–145)
Total Bilirubin: 1 mg/dL (ref 0.3–1.2)
Total Protein: 6.8 g/dL (ref 6.5–8.1)

## 2022-04-09 LAB — CBC WITH DIFFERENTIAL/PLATELET
Abs Immature Granulocytes: 0.04 10*3/uL (ref 0.00–0.07)
Basophils Absolute: 0.1 10*3/uL (ref 0.0–0.1)
Basophils Relative: 1 %
Eosinophils Absolute: 0.2 10*3/uL (ref 0.0–0.5)
Eosinophils Relative: 3 %
HCT: 40.1 % (ref 36.0–46.0)
Hemoglobin: 13.8 g/dL (ref 12.0–15.0)
Immature Granulocytes: 1 %
Lymphocytes Relative: 25 %
Lymphs Abs: 2 10*3/uL (ref 0.7–4.0)
MCH: 30.1 pg (ref 26.0–34.0)
MCHC: 34.4 g/dL (ref 30.0–36.0)
MCV: 87.4 fL (ref 80.0–100.0)
Monocytes Absolute: 0.7 10*3/uL (ref 0.1–1.0)
Monocytes Relative: 9 %
Neutro Abs: 5 10*3/uL (ref 1.7–7.7)
Neutrophils Relative %: 61 %
Platelets: 277 10*3/uL (ref 150–400)
RBC: 4.59 MIL/uL (ref 3.87–5.11)
RDW: 12.6 % (ref 11.5–15.5)
WBC: 7.9 10*3/uL (ref 4.0–10.5)
nRBC: 0 % (ref 0.0–0.2)

## 2022-04-09 LAB — ETHANOL: Alcohol, Ethyl (B): <10 mg/dL (ref <10)

## 2022-04-09 LAB — I-STAT BETA HCG BLOOD, ED (MC, WL, AP ONLY): I-stat hCG, quantitative: <5 m[IU]/mL (ref <5)

## 2022-04-09 LAB — ACETAMINOPHEN LEVEL: Acetaminophen (Tylenol), Serum: <10 ug/mL — ABNORMAL LOW (ref 10–30)

## 2022-04-09 LAB — RESP PANEL BY RT-PCR (FLU A&B, COVID) ARPGX2
Influenza A by PCR: NEGATIVE
Influenza B by PCR: NEGATIVE
SARS Coronavirus 2 by RT PCR: NEGATIVE

## 2022-04-09 LAB — SALICYLATE LEVEL: Salicylate Lvl: <7 mg/dL — ABNORMAL LOW (ref 7.0–30.0)

## 2022-04-09 MED ORDER — OLANZAPINE 10 MG PO TABS
10.0000 mg | ORAL_TABLET | Freq: Every day | ORAL | Status: DC
  Administered 2022-04-09: 10 mg via ORAL
  Filled 2022-04-09: qty 1

## 2022-04-09 MED ORDER — LORAZEPAM 2 MG/ML IJ SOLN
1.0000 mg | Freq: Once | INTRAMUSCULAR | Status: AC
  Administered 2022-04-09: 1 mg via INTRAMUSCULAR
  Filled 2022-04-09: qty 1

## 2022-04-09 MED ORDER — STERILE WATER FOR INJECTION IJ SOLN
INTRAMUSCULAR | Status: AC
  Filled 2022-04-09: qty 10

## 2022-04-09 MED ORDER — OLANZAPINE 10 MG IM SOLR
10.0000 mg | Freq: Once | INTRAMUSCULAR | Status: AC
  Administered 2022-04-09: 10 mg via INTRAMUSCULAR
  Filled 2022-04-09: qty 10

## 2022-04-09 MED ORDER — VITAMIN D 25 MCG (1000 UNIT) PO TABS: 1000.0000 [IU] | ORAL_TABLET | Freq: Every day | ORAL | Status: DC

## 2022-04-09 MED ORDER — TRAZODONE HCL 50 MG PO TABS: 50.0000 mg | ORAL_TABLET | Freq: Every evening | ORAL | Status: DC | PRN

## 2022-04-09 NOTE — BH Assessment (Signed)
TTS clinician attempted to complete assessment. Per Selena Batten, RN, unable to move and set up TTS machine at this time.

## 2022-04-09 NOTE — ED Notes (Signed)
Pt having tts at this time  

## 2022-04-09 NOTE — ED Notes (Signed)
Pt went to bathroom 2x forgot to provide sample both times

## 2022-04-09 NOTE — Consult Note (Signed)
Reached out to see pt for psychiatry assessment via tts cart. Informed by Jessy Oto, NT she is trying to locate a private room for assessment and will reach back to this writer when secured.

## 2022-04-09 NOTE — ED Notes (Signed)
[phone] Okey Regal pt mother would like a update and for her daughter to give her a call.

## 2022-04-09 NOTE — ED Notes (Signed)
Pt taken to room 47 for tts evaluation at this time.

## 2022-04-09 NOTE — ED Triage Notes (Signed)
EMS called out for patient having A/V hallucinations along with paranoia.  They attempted to take patient to [facility] and they refused due to patient being loud and scared.

## 2022-04-09 NOTE — ED Notes (Signed)
Patient was transferred to [facility] ED via EMS per provider.

## 2022-04-09 NOTE — ED Provider Notes (Signed)
[facility] EMERGENCY DEPARTMENT Provider Note   CSN: [id number] Arrival date & time: 04/09/22  1138     History  Chief Complaint  Patient presents with   Paranoid   Hallucinations    Stacy Poole is a 31 y.o. female.  Stacy Poole is a 31 y.o. female with history of PTSD, schizophrenia, and polysubstance abuse, who presents to the emergency department via EMS from [facility] for suicidal ideations and hallucinations.  EMS reports that they picked the patient up on [name]'s campus, they report that she had a job application with her, but suspect that she was triggered by something on campus and started having persistent hallucinations and paranoia, yelling and endorsing thoughts of suicide.  They tried to take the patient to behavioral health urgent care, but provider felt that due to patient's level of agitation she could not care for who they are and patient was sent to the emergency department.  Social worker at [facility] was familiar with patient and reports history of similar presentations.  Upon arrival patient is yelling and agitated.  Patient reporting that she wants to kill herself right now.  Patient complaining of pain in her chest and feeling like she is panicking.   The history is provided by the patient, medical records and the EMS personnel.       Home Medications Prior to Admission medications   Medication Sig Start Date End Date Taking? Authorizing Provider  carbamazepine (TEGRETOL-XR) 200 MG 12 hr tablet Take 1 tablet (200 mg total) by mouth 2 (two) times daily. 01/15/22 01/15/23  Earney Navy, NP  cloNIDine (CATAPRES) 0.1 MG tablet Take 1 tablet (0.1 mg total) by mouth 2 (two) times daily. Patient taking differently: Take 0.1 mg by mouth daily as needed (stress). 09/05/21 01/13/22  Roselle Locus, MD  hydrOXYzine (ATARAX) 25 MG tablet Take 1 tablet (25 mg total) by mouth 3 (three) times daily as needed for anxiety. 09/05/21   Roselle Locus, MD  OLANZapine (ZYPREXA) 10 MG tablet Take 1 tablet (10 mg total) by mouth at bedtime. 01/15/22 02/14/22  Earney Navy, NP  traZODone (DESYREL) 50 MG tablet Take 50 mg by mouth at bedtime as needed for sleep.    [provider]  Vitamin D3 (VITAMIN D) 25 MCG tablet Take 1 tablet (1,000 Units total) by mouth daily. 09/06/21   Hill, Shelbie Hutching, MD      Allergies    Lamictal [lamotrigine], Beef-derived products, Chicken protein, Fish-derived products, Pork-derived products, and Tetracyclines & related    Review of Systems   Review of Systems  Unable to perform ROS: Psychiatric disorder    Physical Exam Updated Vital Signs BP (!) 131/118 (BP Location: Right Arm)   Pulse (!) 134   Temp 98.6 F (37 C) (Oral)   Resp (!) 25   Ht 5\' 3"  (1.6 m)   Wt 77.1 kg   SpO2 99%   BMI 30.11 kg/m  Physical Exam Vitals and nursing note reviewed.  Constitutional:      Appearance: Normal appearance. She is well-developed. She is not diaphoretic.     Comments: On arrival patient is agitated and in some distress, yelling  HENT:     Head: Normocephalic and atraumatic.  Eyes:     General:        Right eye: No discharge.        Left eye: No discharge.     Pupils: Pupils are equal, round, and reactive to  light.  Cardiovascular:     Rate and Rhythm: Regular rhythm. Tachycardia present.     Pulses: Normal pulses.     Heart sounds: Normal heart sounds.  Pulmonary:     Effort: Pulmonary effort is normal. No respiratory distress.     Breath sounds: Normal breath sounds. No wheezing or rales.     Comments: Respirations equal and unlabored, patient able to speak in full sentences, lungs clear to auscultation bilaterally  Abdominal:     General: Bowel sounds are normal. There is no distension.     Palpations: Abdomen is soft. There is no mass.     Tenderness: There is no abdominal tenderness. There is no guarding.     Comments: Abdomen soft, nondistended, nontender to palpation in  all quadrants without guarding or peritoneal signs  Musculoskeletal:        General: No deformity.     Cervical back: Neck supple.  Skin:    General: Skin is warm and dry.     Capillary Refill: Capillary refill takes less than 2 seconds.  Neurological:     Mental Status: She is alert and oriented to person, place, and time.     Coordination: Coordination normal.     Comments: Speech is clear Moves extremities without ataxia, coordination intact  Psychiatric:        Attention and Perception: She perceives auditory hallucinations.        Mood and Affect: Mood normal. Affect is labile.        Speech: Speech is rapid and pressured.        Behavior: Behavior is agitated.        Thought Content: Thought content is paranoid. Thought content includes suicidal ideation. Thought content does not include homicidal ideation.     ED Results / Procedures / Treatments   Labs (all labs ordered are listed, but only abnormal results are displayed) Labs Reviewed  COMPREHENSIVE METABOLIC PANEL - Abnormal; Notable for the following components:      Result Value   Potassium 3.3 (*)    Glucose, Bld 100 (*)    All other components within normal limits  ACETAMINOPHEN LEVEL - Abnormal; Notable for the following components:   Acetaminophen (Tylenol), Serum <10 (*)    All other components within normal limits  SALICYLATE LEVEL - Abnormal; Notable for the following components:   Salicylate Lvl <7.0 (*)    All other components within normal limits  RESP PANEL BY RT-PCR (FLU A&B, COVID) ARPGX2  ETHANOL  CBC WITH DIFFERENTIAL/PLATELET  RAPID URINE DRUG SCREEN, HOSP PERFORMED  I-STAT BETA HCG BLOOD, ED ([facility], [facility], [facility] ONLY)    EKG None  Radiology No results found.  Procedures Procedures    Medications Ordered in ED Medications  sterile water (preservative free) injection (has no administration in time range)  OLANZapine (ZYPREXA) tablet 10 mg (has no administration in time range)  traZODone  (DESYREL) tablet 50 mg (has no administration in time range)  cholecalciferol (VITAMIN D3) 25 MCG (1000 UNIT) tablet 1,000 Units (has no administration in time range)  OLANZapine (ZYPREXA) injection 10 mg (10 mg Intramuscular Given 04/09/22 1203)  LORazepam (ATIVAN) injection 1 mg (1 mg Intramuscular Given 04/09/22 1203)    ED Course/ Medical Decision Making/ A&P                           Medical Decision Making Amount and/or Complexity of Data Reviewed Labs: ordered.  Risk Prescription drug management.  31 y.o. female presents to the ED via EMS for suicidal ideations, hallucinations and agitation, this involves an extensive number of treatment options, and is a complaint that carries with it a high risk of complications and morbidity.  The differential diagnosis includes acute psychosis, exacerbation of underlying PTSD and schizophrenia, substance-induced disorder  On arrival pt is nontoxic, vitals tachycardic on arrival but patient agitated and yelling, on reevaluation after patient, heart rate is normal.   Additional history obtained from EMS personnel. Previous records obtained and reviewed notes from prior psychiatric evaluations  I ordered medication IM Zyprexa and Ativan for acute agitation   Lab Tests:  I Ordered, reviewed, and interpreted labs, which included: No leukocytosis and normal hemoglobin, potassium of 3.3 but no other electrolyte derangements, normal renal and liver function, p.o. potassium replacement ordered, UDS pending but tox labs otherwise normal.  Negative pregnancy.  Negative COVID.  EKG: Sinus tachycardia, QTc normal  ED Course:   After medication in the ED patient now calm, no longer screaming or complaining of any pain, sleeping comfortably.  Reports she is very tired and has not been able to sleep in a few days.  Medical screening labs overall unremarkable.  At this time patient is medically cleared for TTS evaluation.  Patient currently here voluntarily,  has been cooperative since medication and not attempting to leave.  Disposition pending TTS recommendations.  The patient has been placed in psychiatric observation due to the need to provide a safe environment for the patient while obtaining psychiatric consultation and evaluation, as well as ongoing medical and medication management to treat the patient's condition.  The patient has not been placed under full IVC at this time.   Portions of this note were generated with Scientist, clinical (histocompatibility and immunogenetics). Dictation errors may occur despite best attempts at proofreading.         Final Clinical Impression(s) / ED Diagnoses Final diagnoses:  Hallucinations  Suicidal ideation    Rx / DC Orders ED Discharge Orders     None         Legrand Rams 04/09/22 1618    Mancel Bale, MD 04/09/22 2053

## 2022-04-09 NOTE — ED Provider Notes (Signed)
Pt arrived to [facility] by EMS. Per EMS, pt was found at [facility], paranoid, hallucinating. They state pt was able to ambulate to stretcher. Pt currently on stretcher, yelling, screaming, w/ incomprehensible speech, appears to be actively responding to internal stimuli. Made several attempts to assess pt. Pt continues to yell and scream. Unable to assess orientation. Pt is not appropriate for [facility] open mileu at this time. Report was given to Dr. Effie Shy at [facility] who has agreed to accept the pt.

## 2022-04-09 NOTE — ED Notes (Signed)
Patient has hx of bipolar and schizo SW at [name] said this how the patient presents when she is having an episode.

## 2022-04-09 NOTE — Consult Note (Signed)
Attempted to see patient for psych assessment.  Nurse tech in the room as well.  Patient states she is tired , has not been able to sleep and keeps falling asleep during assessment.  Several unsuccessful attempts made  to gain her attention.  Will allow pt to rest and attempt psych re-eval when she wakes up.

## 2022-04-10 LAB — RAPID URINE DRUG SCREEN, HOSP PERFORMED
Amphetamines: NOT DETECTED
Barbiturates: NOT DETECTED
Benzodiazepines: POSITIVE — AB
Cocaine: NOT DETECTED
Opiates: NOT DETECTED
Tetrahydrocannabinol: POSITIVE — AB

## 2022-04-10 NOTE — ED Notes (Signed)
Patient belongings given back from security. Dr Adela Lank came and spoke with patient and patients mother on the phone and he agreed on the release/discharge of the patient. Patient to be discharged and picked up from the lobby by her mother.

## 2022-04-10 NOTE — ED Notes (Signed)
Pt called mother and gave an update. Mother asked to talk with this paramedic. Pt mother stated "I am patients POA and I want her released into my care."

## 2022-04-10 NOTE — ED Notes (Signed)
Patient talked to mother on the phone. Pt wanting to be released at this time.
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
## 2022-04-10 NOTE — Discharge Instructions (Signed)
Follow up with your psychiatrist. °

## 2022-04-13 ENCOUNTER — Other Ambulatory Visit: Payer: Self-pay

## 2022-04-13 ENCOUNTER — Ambulatory Visit (HOSPITAL_COMMUNITY)
Admission: AD | Admit: 2022-04-13 | Discharge: 2022-04-13 | Disposition: A | Discharge status: Home / Self Care | Payer: BC Managed Care – PPO | Attending: Psychiatry | Admitting: Psychiatry

## 2022-04-13 ENCOUNTER — Emergency Department (HOSPITAL_COMMUNITY)
Admission: EM | Admit: 2022-04-13 | Discharge: 2022-04-13 | Disposition: A | Discharge status: Other Acute Inpatient Hospital | Payer: BC Managed Care – PPO | Attending: Emergency Medicine | Admitting: Emergency Medicine

## 2022-04-13 ENCOUNTER — Emergency Department (HOSPITAL_COMMUNITY)
Admission: EM | Admit: 2022-04-13 | Discharge: 2022-04-13 | Discharge status: Against Medical Advice | Payer: BC Managed Care – PPO

## 2022-04-13 DIAGNOSIS — Z79899 Other long term (current) drug therapy: Secondary | ICD-10-CM | POA: Diagnosis not present

## 2022-04-13 DIAGNOSIS — F209 Schizophrenia, unspecified: Secondary | ICD-10-CM | POA: Diagnosis not present

## 2022-04-13 DIAGNOSIS — F201 Disorganized schizophrenia: Secondary | ICD-10-CM

## 2022-04-13 DIAGNOSIS — Z87891 Personal history of nicotine dependence: Secondary | ICD-10-CM | POA: Insufficient documentation

## 2022-04-13 DIAGNOSIS — Z043 Encounter for examination and observation following other accident: Secondary | ICD-10-CM | POA: Diagnosis present

## 2022-04-13 DIAGNOSIS — Y900 Blood alcohol level of less than 20 mg/100 ml: Secondary | ICD-10-CM | POA: Insufficient documentation

## 2022-04-13 DIAGNOSIS — Z20822 Contact with and (suspected) exposure to covid-19: Secondary | ICD-10-CM | POA: Diagnosis not present

## 2022-04-13 DIAGNOSIS — F29 Unspecified psychosis not due to a substance or known physiological condition: Secondary | ICD-10-CM | POA: Insufficient documentation

## 2022-04-13 LAB — CBC WITH DIFFERENTIAL/PLATELET
Abs Immature Granulocytes: 0.04 10*3/uL (ref 0.00–0.07)
Basophils Absolute: 0 10*3/uL (ref 0.0–0.1)
Basophils Relative: 0 %
Eosinophils Absolute: 0.2 10*3/uL (ref 0.0–0.5)
Eosinophils Relative: 2 %
HCT: 41.3 % (ref 36.0–46.0)
Hemoglobin: 13.8 g/dL (ref 12.0–15.0)
Immature Granulocytes: 0 %
Lymphocytes Relative: 19 %
Lymphs Abs: 2.2 10*3/uL (ref 0.7–4.0)
MCH: 29.6 pg (ref 26.0–34.0)
MCHC: 33.4 g/dL (ref 30.0–36.0)
MCV: 88.6 fL (ref 80.0–100.0)
Monocytes Absolute: 0.8 10*3/uL (ref 0.1–1.0)
Monocytes Relative: 7 %
Neutro Abs: 8.3 10*3/uL — ABNORMAL HIGH (ref 1.7–7.7)
Neutrophils Relative %: 72 %
Platelets: 275 10*3/uL (ref 150–400)
RBC: 4.66 MIL/uL (ref 3.87–5.11)
RDW: 12.9 % (ref 11.5–15.5)
WBC: 11.7 10*3/uL — ABNORMAL HIGH (ref 4.0–10.5)
nRBC: 0 % (ref 0.0–0.2)

## 2022-04-13 LAB — COMPREHENSIVE METABOLIC PANEL
ALT: 22 U/L (ref 0–44)
AST: 23 U/L (ref 15–41)
Albumin: 4.3 g/dL (ref 3.5–5.0)
Alkaline Phosphatase: 47 U/L (ref 38–126)
Anion gap: 7 (ref 5–15)
BUN: 12 mg/dL (ref 6–20)
CO2: 25 mmol/L (ref 22–32)
Calcium: 9.4 mg/dL (ref 8.9–10.3)
Chloride: 106 mmol/L (ref 98–111)
Creatinine, Ser: 0.9 mg/dL (ref 0.44–1.00)
GFR, Estimated: >60 mL/min (ref >60)
Glucose, Bld: 114 mg/dL — ABNORMAL HIGH (ref 70–99)
Potassium: 4.1 mmol/L (ref 3.5–5.1)
Sodium: 138 mmol/L (ref 135–145)
Total Bilirubin: 0.5 mg/dL (ref 0.3–1.2)
Total Protein: 7.5 g/dL (ref 6.5–8.1)

## 2022-04-13 LAB — RAPID URINE DRUG SCREEN, HOSP PERFORMED
Amphetamines: NOT DETECTED
Barbiturates: NOT DETECTED
Benzodiazepines: NOT DETECTED
Cocaine: NOT DETECTED
Opiates: NOT DETECTED
Tetrahydrocannabinol: POSITIVE — AB

## 2022-04-13 LAB — ACETAMINOPHEN LEVEL: Acetaminophen (Tylenol), Serum: <10 ug/mL — ABNORMAL LOW (ref 10–30)

## 2022-04-13 LAB — I-STAT BETA HCG BLOOD, ED (MC, WL, AP ONLY): I-stat hCG, quantitative: <5 m[IU]/mL (ref <5)

## 2022-04-13 LAB — CBG MONITORING, ED: Glucose-Capillary: 118 mg/dL — ABNORMAL HIGH (ref 70–99)

## 2022-04-13 LAB — ETHANOL: Alcohol, Ethyl (B): <10 mg/dL (ref <10)

## 2022-04-13 LAB — SALICYLATE LEVEL: Salicylate Lvl: <7 mg/dL — ABNORMAL LOW (ref 7.0–30.0)

## 2022-04-13 LAB — SARS CORONAVIRUS 2 BY RT PCR: SARS Coronavirus 2 by RT PCR: NEGATIVE

## 2022-04-13 LAB — PREGNANCY, URINE: Preg Test, Ur: NEGATIVE

## 2022-04-13 MED ORDER — OLANZAPINE 10 MG PO TBDP
10.0000 mg | ORAL_TABLET | Freq: Once | ORAL | Status: AC
  Administered 2022-04-13: 10 mg via ORAL
  Filled 2022-04-13: qty 1

## 2022-04-13 MED ORDER — LORAZEPAM 1 MG PO TABS
1.0000 mg | ORAL_TABLET | Freq: Once | ORAL | Status: AC
  Administered 2022-04-13: 1 mg via ORAL
  Filled 2022-04-13: qty 1

## 2022-04-13 MED ORDER — NICOTINE 21 MG/24HR TD PT24
21.0000 mg | MEDICATED_PATCH | Freq: Every day | TRANSDERMAL | Status: DC
  Administered 2022-04-13: 21 mg via TRANSDERMAL
  Filled 2022-04-13: qty 1

## 2022-04-13 MED ORDER — LORAZEPAM 1 MG PO TABS
1.0000 mg | ORAL_TABLET | ORAL | Status: AC | PRN
  Administered 2022-04-13: 1 mg via ORAL
  Filled 2022-04-13: qty 1

## 2022-04-13 MED ORDER — ZIPRASIDONE MESYLATE 20 MG IM SOLR: 20.0000 mg | INTRAMUSCULAR | Status: DC | PRN

## 2022-04-13 MED ORDER — OLANZAPINE 5 MG PO TBDP
5.0000 mg | ORAL_TABLET | Freq: Three times a day (TID) | ORAL | Status: DC | PRN
  Administered 2022-04-13: 5 mg via ORAL
  Filled 2022-04-13: qty 1
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
## 2022-04-13 NOTE — ED Notes (Signed)
Patient is starting to get agitated and angry.

## 2022-04-13 NOTE — BH Assessment (Addendum)
[facility] Assessment Progress Note   Per Hillery Jacks, NP, this pt requires psychiatric hospitalization at this time.  Pt presents under IVC initiated by Thresa Ross, MD.  At 13:47 Morrie Sheldon calls from [facility] to report that pt has been accepted to their [facility] by Joylene Igo, NP.  They will be ready to receive pt around 18:00.  Alcario Drought concurs with this decision.  EDP Alvino Blood, MD and pt's nurse, Casimiro Needle, have been notified, and Casimiro Needle agrees to call report to [phone] when the time comes.  Pt is to be transported via [facility].  Doylene Canning Behavioral Health Coordinator [phone]

## 2022-04-13 NOTE — ED Provider Notes (Addendum)
[facility]-EMERGENCY DEPT Provider Note  CSN: [id number] Arrival date & time: 04/13/22 6761  Chief Complaint(s) Psychiatric Evaluation and Paranoid  HPI Stacy Poole is a 31 y.o. female with history of schizophrenia, bipolar disorder presenting to the emergency department requesting a pregnancy test.  Patient reports that she is pregnant with a "[place] baby ".  Patient also reports that "Iraqis" somehow involved with this.  It was otherwise difficult to get much history from the patient as she is very tangential but otherwise seems to deny specific complaints such as chest pain, headache, nausea, vomiting, abdominal pain, fevers, chills.  History limited due to patient's psychiatric condition.  [facility] and [facility] on IVC by psychiatric team, subsequently brought to [facility] for further medical clearance   Past Medical History Past Medical History:  Diagnosis Date   Acute ear infection    Anxiety    Arachnoid cyst    Bipolar 1 disorder (HCC)    Fifth disease    Insomnia    Mental disorder    Mononucleosis    PTSD (post-traumatic stress disorder)    Schizophrenia (HCC)    Substance abuse (HCC)    Patient Active Problem List   Diagnosis Date Noted   PTSD (post-traumatic stress disorder) 06/25/2021   Bipolar affective disorder, currently manic, severe, with psychotic features (HCC) 06/22/2021   Suicidal ideation    Schizoaffective disorder (HCC) 10/14/2019   Schizophrenia (HCC) 03/27/2019   Alcohol use disorder, severe, dependence (HCC) 03/05/2019   Alcohol abuse with alcohol-induced psychotic disorder, 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/2019   Alcohol abuse    Psychosis (HCC)    Schizoaffective disorder, bipolar type (HCC) 05/06/2018   Tobacco use disorder 05/06/2018   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 Authorizing Provider  Ascorbic Acid (VITAMIN C PO) Take 1 tablet by mouth daily.   Yes [provider]  cloNIDine HCl (KAPVAY) 0.1 MG TB12 ER tablet Take 0.1 mg by mouth at bedtime. 03/24/22  Yes [provider]  escitalopram (LEXAPRO) 10 MG tablet Take 10 mg by mouth at bedtime. 03/22/22  Yes [provider]  hydrOXYzine (ATARAX) 25 MG tablet Take 1 tablet (25 mg total) by mouth 3 (three) times daily as needed for anxiety. Patient taking differently: Take 25 mg by mouth 3 (three) times daily as needed for anxiety (or agitation). 09/05/21  Yes Hill, Shelbie Hutching, MD  ibuprofen (ADVIL) 200 MG tablet Take 400-600 mg by mouth every 6 (six) hours as needed (pain or headaches).   Yes [provider]  Multiple Vitamin (MULTIVITAMIN) tablet Take 1 tablet by mouth daily with breakfast.   Yes [provider]  OLANZapine (ZYPREXA) 7.5 MG tablet Take 7.5 mg by mouth at bedtime. 03/24/22  Yes [provider]  Omega-3 Fatty Acids (FISH OIL PO) Take 1 capsule by mouth daily.   Yes [provider]  traZODone (DESYREL) 50 MG tablet Take 50 mg by mouth at bedtime.   Yes [provider]  carbamazepine (TEGRETOL) 200 MG tablet Take 200 mg by mouth in the morning and at bedtime. 03/24/22   [provider]  carbamazepine (TEGRETOL-XR) 200 MG 12 hr tablet Take 1 tablet (200 mg total) by mouth 2 (two) times daily.  Patient not taking: Reported on 04/10/2022 01/15/22 01/15/23  Delfin Gant, NP  cloNIDine (CATAPRES) 0.1 MG tablet Take 1 tablet (0.1 mg total) by mouth 2 (two) times daily. Patient not taking: Reported on 04/13/2022 09/05/21 04/13/22  Maida Sale, MD  OLANZapine (ZYPREXA) 10 MG tablet Take 1 tablet (10 mg total) by mouth at bedtime. Patient not taking: Reported on  04/13/2022 01/15/22 04/13/22  Delfin Gant, NP  Vitamin D3 (VITAMIN D) 25 MCG tablet Take 1 tablet (1,000 Units total) by mouth daily. Patient not taking: Reported on 04/13/2022 09/06/21   Maida Sale, MD                                                                                                                                    Past Surgical History No past surgical history on file. Family History No family history on file.  Social History Social History   Tobacco Use   Smoking status: Former    Packs/day: 0.50    Years: 10.00    Total pack years: 5.00    Types: Cigarettes   Smokeless tobacco: Never  Vaping Use   Vaping Use: Never used  Substance Use Topics   Alcohol use: Yes   Drug use: Not Currently    Comment: pt denies, + cannabis   Allergies Abilify [aripiprazole], Lamictal [lamotrigine], Carbamazepine, Other, Pork-derived products, and Tetracyclines & related  Review of Systems Review of Systems  All other systems reviewed and are negative.   Physical Exam Vital Signs  I have reviewed the triage vital signs BP 135/81 (BP Location: Left Arm)   Pulse 95   Temp 98.1 F (36.7 C) (Oral)   Resp 18   SpO2 100%   Physical Exam Vitals and nursing note reviewed.  Constitutional:      General: She is not in acute distress.    Appearance: She is well-developed.  HENT:     Head: Normocephalic and atraumatic.     Mouth/Throat:     Mouth: Mucous membranes are moist.  Eyes:     Pupils: Pupils are equal, round, and reactive to light.  Cardiovascular:     Rate and Rhythm: Normal rate and regular rhythm.     Heart sounds: No murmur heard. Pulmonary:     Effort: Pulmonary effort is normal. No respiratory distress.     Breath sounds: Normal breath sounds.  Abdominal:     General: Abdomen is flat.     Palpations: Abdomen is soft.     Tenderness: There is no abdominal tenderness.  Musculoskeletal:        General: No tenderness.     Right lower leg:  No edema.     Left lower leg: No edema.  Skin:    General: Skin is warm and dry.  Neurological:     General: No focal deficit present.     Mental Status: She is alert. Mental status  is at baseline.  Psychiatric:        Attention and Perception: She is inattentive. She perceives auditory hallucinations.        Mood and Affect: Affect is labile.        Speech: Speech is rapid and pressured and tangential.        Behavior: Behavior is agitated and hyperactive. Behavior is not aggressive.        Thought Content: Thought content is paranoid and delusional. Thought content does not include homicidal or suicidal ideation.     ED Results and Treatments Labs (all labs ordered are listed, but only abnormal results are displayed) Labs Reviewed  COMPREHENSIVE METABOLIC PANEL - Abnormal; Notable for the following components:      Result Value   Glucose, Bld 114 (*)    All other components within normal limits  SALICYLATE LEVEL - Abnormal; Notable for the following components:   Salicylate Lvl Q000111Q (*)    All other components within normal limits  ACETAMINOPHEN LEVEL - Abnormal; Notable for the following components:   Acetaminophen (Tylenol), Serum <10 (*)    All other components within normal limits  RAPID URINE DRUG SCREEN, HOSP PERFORMED - Abnormal; Notable for the following components:   Tetrahydrocannabinol POSITIVE (*)    All other components within normal limits  CBC WITH DIFFERENTIAL/PLATELET - Abnormal; Notable for the following components:   WBC 11.7 (*)    Neutro Abs 8.3 (*)    All other components within normal limits  CBG MONITORING, ED - Abnormal; Notable for the following components:   Glucose-Capillary 118 (*)    All other components within normal limits  SARS CORONAVIRUS 2 BY RT PCR  ETHANOL  PREGNANCY, URINE  I-STAT BETA HCG BLOOD, ED ([facility], [facility], [facility] ONLY)                                                                                                                           Radiology No results found.  Pertinent labs & imaging results that were available during my care of the patient were reviewed by me and considered in my medical decision making (see MDM for details).  Medications Ordered in ED Medications  nicotine (NICODERM CQ - dosed in mg/24 hours) patch 21 mg (21 mg Transdermal Patch Applied 04/13/22 1134)  OLANZapine zydis (ZYPREXA) disintegrating tablet 5 mg (5 mg Oral Given 04/13/22 1539)    And  LORazepam (ATIVAN) tablet 1 mg (has no administration in time range)    And  ziprasidone (GEODON) injection 20 mg (has no administration in time range)  OLANZapine zydis (ZYPREXA) disintegrating tablet 10 mg (10 mg Oral Given 04/13/22 0725)  LORazepam (ATIVAN) tablet 1 mg (1 mg Oral Given 04/13/22 0758)  Procedures Procedures  (including critical care time)  Medical Decision Making / ED Course   This patient presents to the ED for concern of psychosis, this involves an extensive number of treatment options, and is a complaint that carries with it a high risk of complications and morbidity.  The differential diagnosis includes psychosis, intoxication, toxic or metabolic encephalopathy, intracranial injury  MDM: 31 year old female with history of psychiatric disease presenting with abnormal behavior.  Patient extremely tangential and difficult to interview but denies medical complaints.  Perseverative on pregnancy test which is negative  Remainder of medical workup unremarkable.  Patient already on IVC per behavioral health.  Patient will need to be transferred for inpatient psychiatric care at this time.  Patient medically cleared      Additional history obtained: -Additional history obtained from EMS -External records from outside source obtained and reviewed including: Chart review including previous notes, labs,  imaging, consultation notes   Lab Tests: -I ordered, reviewed, and interpreted labs.   The pertinent results include:   Labs Reviewed  COMPREHENSIVE METABOLIC PANEL - Abnormal; Notable for the following components:      Result Value   Glucose, Bld 114 (*)    All other components within normal limits  SALICYLATE LEVEL - Abnormal; Notable for the following components:   Salicylate Lvl Q000111Q (*)    All other components within normal limits  ACETAMINOPHEN LEVEL - Abnormal; Notable for the following components:   Acetaminophen (Tylenol), Serum <10 (*)    All other components within normal limits  RAPID URINE DRUG SCREEN, HOSP PERFORMED - Abnormal; Notable for the following components:   Tetrahydrocannabinol POSITIVE (*)    All other components within normal limits  CBC WITH DIFFERENTIAL/PLATELET - Abnormal; Notable for the following components:   WBC 11.7 (*)    Neutro Abs 8.3 (*)    All other components within normal limits  CBG MONITORING, ED - Abnormal; Notable for the following components:   Glucose-Capillary 118 (*)    All other components within normal limits  SARS CORONAVIRUS 2 BY RT PCR  ETHANOL  PREGNANCY, URINE  I-STAT BETA HCG BLOOD, ED ([facility], [facility], [facility] ONLY)     Medicines ordered and prescription drug management: Meds ordered this encounter  Medications   OLANZapine zydis (ZYPREXA) disintegrating tablet 10 mg   LORazepam (ATIVAN) tablet 1 mg   nicotine (NICODERM CQ - dosed in mg/24 hours) patch 21 mg   AND Linked Order Group    OLANZapine zydis (ZYPREXA) disintegrating tablet 5 mg    LORazepam (ATIVAN) tablet 1 mg    ziprasidone (GEODON) injection 20 mg    -I have reviewed the patients home medicines and have made adjustments as needed  Consultations Obtained: I requested consultation with the psychiatry team,  and discussed lab and imaging findings as well as pertinent plan - they recommend: Inpatient admission     Reevaluation: After the interventions noted  above, I reevaluated the patient and found that they have :stayed the same  Co morbidities that complicate the patient evaluation  Past Medical History:  Diagnosis Date   Acute ear infection    Anxiety    Arachnoid cyst    Bipolar 1 disorder ([place])    Fifth disease    Insomnia    Mental disorder    Mononucleosis    PTSD (post-traumatic stress disorder)    Schizophrenia ([place])    Substance abuse ([place])       Dispostion: I considered admission for this patient, and patient  will need to be admitted to inpatient psychiatric facility due to her gross disorganization and active psychosis, patient is danger to self as she is unable to describe self-care plan     Final Clinical Impression(s) / ED Diagnoses Final diagnoses:  Psychosis, unspecified psychosis type [facility])     This chart was dictated using voice recognition software.  Despite best efforts to proofread,  errors can occur which can change the documentation meaning.    Lonell Grandchild, MD 04/13/22 1259    Lonell Grandchild, MD 04/13/22 [phone]

## 2022-04-13 NOTE — ED Notes (Signed)
Pt left facility.

## 2022-04-13 NOTE — ED Provider Triage Note (Signed)
Emergency Medicine Provider Triage Evaluation Note  CHARNELL PEPLINSKI , a 31 y.o. female  was evaluated in triage.  Pt complains of IVC for delusions   Review of Systems  Positive: Delusions and paranoia  Negative: Vomiting   Physical Exam  BP (!) 155/113 (BP Location: Left Arm)   Pulse (!) 120   Temp 98.2 F (36.8 C) (Oral)   Resp 16   SpO2 99%  Gen:   Awake, no distress   Resp:  Normal effort  MSK:   Moves extremities without difficulty  Other:  Delusions of being pursued by gangs and ISIS  Medical Decision Making  Medically screening exam initiated at 6:44 AM.  Appropriate orders placed.  ELISHEVA FALLAS was informed that the remainder of the evaluation will be completed by another provider, this initial triage assessment does not replace that evaluation, and the importance of remaining in the ED until their evaluation is complete.     Mithcell Schumpert, MD 04/13/22 8850

## 2022-04-13 NOTE — H&P (Signed)
Behavioral Health Medical Screening Exam  Stacy Poole is a 31 y.o. female with psychiatric history of PTSD, schizophrenia, and polysubstance abuse who presents to [facility] voluntarily as a walk-in with complaints of "two dudes trying to shoot me".    Per Engineer, materials, pt was picked up earlier by [employer] and sent to [facility] for psychosis and paranoia, but she walked out before being seen by ED staff. Pt presents to [facility] as very manic with bizarre behavior and reports  "I ran over here,that dude almost shot me, you don't wanna know".    Pt presents as very paranoid, psychotic with disorganized and tangential rambling speech. Pt is very restless, pacing, wide-eyed, and hyperactive.  Patient yells at provider and security officer to "stay the F...ck away from me", " you are trying to get me set up to get shot". "This is why I wanna get shot". " I wanna die". " This bitch is fucking setting me up" . " Don't come close to me, stay away from me". " Yeah, I'm gonna call Dorinda Hill trump right now". Pt refused to answer any questions directed at her and is completely non-redirectable/able to follow commands.   Pt continues "That dude almost shot me, you dont wanna know dude, Im gonna fuckiing throw this fucking clipboard at the floor". "Stay away from me". " I don't trust you fuc...ng people".   Pt reports, 'the disgusting bitches at [facility], I'm not trying to start a fucking war, I'm not fucking ISIS, I'm writing a letter to Dorinda Hill trump now". " My mom is mentally ill, and and Isis are back again that is why I opted for the death penalty, vodka, someones army, dont open the door, its not safe". Pt is inattentive and extremely difficult to redirect, and verbally aggressive and loud to staff.   At this point, 911 was already called and [employer] dispatched to the [facility]. When patient saw the Police officers, she became very anxious and physically frightened, crying loudly, shouting and saying, don't open the door x3, you are  gonna get me killed, they want me dead".   Pt refused to voluntarily accompany [employer] to the ED. [employer] reports they are unable to take patient to the ED unless she voluntarily accompanies them. Pt has remained uncooperative with rapid and pressured nonsensical speech. Pt is agitated and hyperactive and pacing continously while being verbally aggressive and unapproachable.   Pt needs inpatient psychiatric admission for crisis stabilization and meets criteria for IVC. On call back up [facility] service line Dr Thresa Ross notified of pt's psychotic presentation, refusal to voluntarily go to the ED, and IVC recommendation.    Dr Gilmore Laroche evaluates the patient via video-teleconference call for first exam. Pt is yelling incoherently, not answering questions, agitated, verbally abusive, with rapid and pressured speech.   Pt petitioned for IVC by Dr Gilmore Laroche after completion of first exam. Pt is not in custody of [employer] at this time as the officers left while staff at [facility] was processing IVC custody order. Completed IVC paperwork  (1st exam and petition) notarized by [facility] and faxed to magistrate's office. Staff awaiting return of IVC custody order.   On evaluation, patient is alert, provider unable to assess orientation. Pt is uncooperative. Eyes are glazed, Speech is rambling, tangential and disorganized. Pt appears casual. Eye contact is staring. Mood is anxious, affect is congruent with mood. Thought process and thought content is disorganized and tangential. Unable to assess SI/HI/AVH, but there are imminent safety concerns  and risk of harm to patient and others at this time due to psychotic presentation and delusions. There is indication that the patient is responding to internal stimuli as she laughs inappropriately, cries the next minute, and proceeds to talk to someone on her phone about staff trying to get her killed . Pt appears delusional during this assessment, talking about Isis is here, martial law, signing up to  get the death penalty, and more nonsensical stuff.    Total Time spent with patient: 45 minutes  Psychiatric Specialty Exam:  Presentation  General Appearance: Bizarre  Eye Contact:Other (comment) (Bizarre)  Speech:Other (comment) (Disorganized)  Speech Volume:Increased  Handedness:Right (UTA)   Mood and Affect  Mood:Anxious; Irritable  Affect:Congruent   Thought Process  Thought Processes:Disorganized  Descriptions of Associations:Tangential  Orientation:Other (comment) (UTA)  Thought Content:Paranoid Ideation; Delusions; Scattered; Tangential; Perseveration  History of Schizophrenia/Schizoaffective disorder:-- (UTA)  Duration of Psychotic Symptoms:-- (UTA)  Hallucinations:Hallucinations: Other (comment) (UTA)  Ideas of Reference:Delusions; Paranoia; Percusatory  Suicidal Thoughts:Suicidal Thoughts: -- 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)  Homicidal Thoughts:Homicidal Thoughts: -- (UTA)   Sensorium  Memory:Immediate Poor; Remote Poor  Judgment:Impaired  Insight:Lacking   Executive Functions  Concentration:Poor  Attention Span:Poor  Recall:Poor  Fund of Knowledge:Poor  Language:Poor   Psychomotor Activity  Psychomotor Activity:Psychomotor Activity: Restlessness   Assets  Assets:Other (comment) (UTA)   Sleep  Sleep:Sleep: -- (UTA)    Physical Exam: Physical Exam Constitutional:      General: She is in acute distress.     Appearance: She is toxic-appearing.  Eyes:     General:        Right eye: No discharge.        Left eye: No discharge.  Pulmonary:     Effort: No respiratory distress.  Chest:     Chest wall: No tenderness.  Neurological:     Mental Status: She is alert.  Psychiatric:        Attention and Perception: She is inattentive.        Mood and Affect: Mood is anxious. Affect is labile and angry.        Speech: Speech is rapid and pressured and tangential.        Behavior: Behavior is agitated and hyperactive.        Thought Content: Thought  content is paranoid and delusional.        Cognition and Memory: Cognition is impaired.        Judgment: Judgment is impulsive and inappropriate.    Review of Systems  Constitutional:  Negative for diaphoresis.  HENT:  Negative for congestion.   Eyes:  Negative for discharge.  Respiratory:  Negative for cough, shortness of breath and wheezing.   Cardiovascular:  Negative for chest pain and palpitations.  Gastrointestinal:  Negative for diarrhea, nausea and vomiting.  Neurological:  Negative for dizziness, seizures, loss of consciousness and weakness.  Psychiatric/Behavioral:  Negative for depression, substance abuse and suicidal ideas. The patient is nervous/anxious.     Musculoskeletal: Strength & Muscle Tone: within normal limits Gait & Station: normal Patient leans: N/A  [place] Scale:  Flowsheet Row ED from 04/09/2022 in [facility] EMERGENCY DEPARTMENT ED from [facility]/13/2022 in [facility] from 01/10/2022 in [facility] EMERGENCY DEPARTMENT  C-SSRS RISK CATEGORY No Risk No Risk No Risk       Recommendations:  Based on my evaluation the patient appears to have an emergency medical condition for which I recommend the patient be transferred to the  emergency department for further evaluation.  Recommend in-patient psychiatric admission for crisis stabilization, safety monitoring and medication management.  Pt is to be served IVC papers by [employer], when custody order is returned. Pt is to be transported to [facility] ED by [employer] for safety monitoring as there are no appropriate beds available at the [facility] at this time.   Mancel Bale, NP 04/13/2022, 2:40 AM
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
## 2022-04-13 NOTE — ED Notes (Signed)
Dr Suezanne Jacquet notified that patient is very agitated and manic. Thoughts intangible and unorganized.

## 2022-04-13 NOTE — ED Notes (Signed)
Accepted to [facility] by Joylene Igo, NP to their [facility].  Will be ready to take delivery around 18:00.  IVC, will go via sheriff.  Please call nursing report to [phone].

## 2022-04-13 NOTE — ED Triage Notes (Signed)
Patient BIB PD with IVC paperwork.  Per paperwork, patient is psychotic, bizarre, delusional, and extremely paranoid with impaired judgement.  Hx of Bipolar Disorder

## 2022-04-18 ENCOUNTER — Telehealth (HOSPITAL_COMMUNITY): Payer: Self-pay

## 2022-04-18 NOTE — BH Assessment (Signed)
Care Management - Follow Up [facility] Discharges   Patient has been placed in an inpatient psychiatric hospital [facility]) on 04-13-2022.

## 2022-04-26 ENCOUNTER — Encounter (HOSPITAL_COMMUNITY): Payer: Self-pay | Admitting: Emergency Medicine

## 2022-04-26 ENCOUNTER — Other Ambulatory Visit: Payer: Self-pay

## 2022-04-26 ENCOUNTER — Emergency Department (HOSPITAL_COMMUNITY)
Admission: EM | Admit: 2022-04-26 | Discharge: 2022-04-27 | Disposition: A | Discharge status: Home / Self Care | Payer: BC Managed Care – PPO | Attending: Emergency Medicine | Admitting: Emergency Medicine

## 2022-04-26 DIAGNOSIS — F1020 Alcohol dependence, uncomplicated: Secondary | ICD-10-CM | POA: Insufficient documentation

## 2022-04-26 DIAGNOSIS — F259 Schizoaffective disorder, unspecified: Secondary | ICD-10-CM | POA: Insufficient documentation

## 2022-04-26 DIAGNOSIS — F29 Unspecified psychosis not due to a substance or known physiological condition: Secondary | ICD-10-CM | POA: Diagnosis not present

## 2022-04-26 DIAGNOSIS — Z046 Encounter for general psychiatric examination, requested by authority: Secondary | ICD-10-CM | POA: Diagnosis present

## 2022-04-26 DIAGNOSIS — Y900 Blood alcohol level of less than 20 mg/100 ml: Secondary | ICD-10-CM | POA: Diagnosis not present

## 2022-04-26 DIAGNOSIS — R45851 Suicidal ideations: Secondary | ICD-10-CM | POA: Insufficient documentation

## 2022-04-26 DIAGNOSIS — F22 Delusional disorders: Secondary | ICD-10-CM | POA: Diagnosis not present

## 2022-04-26 DIAGNOSIS — Z20822 Contact with and (suspected) exposure to covid-19: Secondary | ICD-10-CM | POA: Insufficient documentation

## 2022-04-26 MED ORDER — HYDROXYZINE HCL 25 MG PO TABS
25.0000 mg | ORAL_TABLET | Freq: Three times a day (TID) | ORAL | Status: DC | PRN
  Administered 2022-04-27: 25 mg via ORAL
  Filled 2022-04-26: qty 1

## 2022-04-26 MED ORDER — TRAZODONE HCL 50 MG PO TABS
50.0000 mg | ORAL_TABLET | Freq: Every day | ORAL | Status: DC
  Administered 2022-04-27: 50 mg via ORAL
  Filled 2022-04-26: qty 1

## 2022-04-26 MED ORDER — OLANZAPINE 5 MG PO TABS
7.5000 mg | ORAL_TABLET | Freq: Every day | ORAL | Status: DC
  Administered 2022-04-27: 7.5 mg via ORAL
  Filled 2022-04-26: qty 1

## 2022-04-26 MED ORDER — OLANZAPINE 10 MG PO TABS
10.0000 mg | ORAL_TABLET | Freq: Every day | ORAL | Status: DC
  Filled 2022-04-26: qty 1

## 2022-04-26 MED ORDER — CLONIDINE HCL ER 0.1 MG PO TB12
0.1000 mg | ORAL_TABLET | Freq: Every day | ORAL | Status: DC
Start: 2022-04-26 — End: 2022-04-27
  Administered 2022-04-27: 0.1 mg via ORAL
  Filled 2022-04-26 (×2): qty 1

## 2022-04-26 MED ORDER — ESCITALOPRAM OXALATE 10 MG PO TABS
10.0000 mg | ORAL_TABLET | Freq: Every day | ORAL | Status: DC
  Administered 2022-04-27: 10 mg via ORAL
  Filled 2022-04-26: qty 1

## 2022-04-26 MED ORDER — OMEGA-3-ACID ETHYL ESTERS 1 G PO CAPS
2.0000 g | ORAL_CAPSULE | Freq: Two times a day (BID) | ORAL | Status: DC
  Administered 2022-04-27 (×2): 2 g via ORAL
  Filled 2022-04-26 (×3): qty 2
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
## 2022-04-26 NOTE — ED Triage Notes (Signed)
Pt here with psychotic behavior. Pt singing, dancing, and  has gold spray paint on her face. Pt with disorganized thought and speech.

## 2022-04-26 NOTE — ED Provider Notes (Incomplete)
[facility] EMERGENCY DEPARTMENT Provider Note   CSN: [id number] Arrival date & time: 04/26/22  2241     History {Add pertinent medical, surgical, social history, OB history to HPI:1} Chief Complaint  Patient presents with  . Manic Behavior    Stacy Poole is a 31 y.o. female.  HPI     Home Medications Prior to Admission medications   Medication Sig Start Date End Date Taking? Authorizing Provider  Ascorbic Acid (VITAMIN C PO) Take 1 tablet by mouth daily.    [provider]  carbamazepine (TEGRETOL) 200 MG tablet Take 200 mg by mouth in the morning and at bedtime. 03/24/22   [provider]  carbamazepine (TEGRETOL-XR) 200 MG 12 hr tablet Take 1 tablet (200 mg total) by mouth 2 (two) times daily. Patient not taking: Reported on 04/10/2022 01/15/22 01/15/23  Earney Navy, NP  cloNIDine (CATAPRES) 0.1 MG tablet Take 1 tablet (0.1 mg total) by mouth 2 (two) times daily. Patient not taking: Reported on 04/13/2022 09/05/21 04/13/22  Roselle Locus, MD  cloNIDine HCl (KAPVAY) 0.1 MG TB12 ER tablet Take 0.1 mg by mouth at bedtime. 03/24/22   [provider]  escitalopram (LEXAPRO) 10 MG tablet Take 10 mg by mouth at bedtime. 03/22/22   [provider]  hydrOXYzine (ATARAX) 25 MG tablet Take 1 tablet (25 mg total) by mouth 3 (three) times daily as needed for anxiety. Patient taking differently: Take 25 mg by mouth 3 (three) times daily as needed for anxiety (or agitation). 09/05/21   Roselle Locus, MD  ibuprofen (ADVIL) 200 MG tablet Take 400-600 mg by mouth every 6 (six) hours as needed (pain or headaches).    [provider]  Multiple Vitamin (MULTIVITAMIN) tablet Take 1 tablet by mouth daily with breakfast.    [provider]  OLANZapine (ZYPREXA) 10 MG tablet Take 1 tablet (10 mg total) by mouth at bedtime. Patient not taking: Reported on 04/13/2022 01/15/22 04/13/22  Earney Navy, NP   OLANZapine (ZYPREXA) 7.5 MG tablet Take 7.5 mg by mouth at bedtime. 03/24/22   [provider]  Omega-3 Fatty Acids (FISH OIL PO) Take 1 capsule by mouth daily.    [provider]  traZODone (DESYREL) 50 MG tablet Take 50 mg by mouth at bedtime.    [provider]  Vitamin D3 (VITAMIN D) 25 MCG tablet Take 1 tablet (1,000 Units total) by mouth daily. Patient not taking: Reported on 04/13/2022 09/06/21   Roselle Locus, MD      Allergies    Abilify [aripiprazole], Lamictal [lamotrigine], Carbamazepine, Other, Pork-derived products, and Tetracyclines & related    Review of Systems   Review of Systems  Physical Exam Updated Vital Signs BP (!) 144/93 (BP Location: Right Arm)   Pulse (!) 121   Temp 98.9 F (37.2 C) (Oral)   Resp 16   SpO2 98%  Physical Exam  ED Results / Procedures / Treatments   Labs (all labs ordered are listed, but only abnormal results are displayed) Labs Reviewed  RESP PANEL BY RT-PCR (FLU A&B, COVID) ARPGX2  COMPREHENSIVE METABOLIC PANEL  ETHANOL  RAPID URINE DRUG SCREEN, HOSP PERFORMED  CBC WITH DIFFERENTIAL/PLATELET  I-STAT BETA HCG BLOOD, ED ([facility], [facility], [facility] ONLY)    EKG None  Radiology No results found.  Procedures Procedures  {Document cardiac monitor, telemetry assessment procedure when appropriate:1}  Medications Ordered in ED Medications  OLANZapine (ZYPREXA) tablet 7.5 mg (has no administration in time  range)  cloNIDine HCl (KAPVAY) ER tablet 0.1 mg (has no administration in time range)  traZODone (DESYREL) tablet 50 mg (has no administration in time range)  omega-3 acid ethyl esters (LOVAZA) capsule 2 g (has no administration in time range)  escitalopram (LEXAPRO) tablet 10 mg (has no administration in time range)  hydrOXYzine (ATARAX) tablet 25 mg (has no administration in time range)    ED Course/ Medical Decision Making/ A&P                           Medical Decision Making  ***  {Document critical  care time when appropriate:1} {Document review of labs and clinical decision tools ie heart score, Chads2Vasc2 etc:1}  {Document your independent review of radiology images, and any outside records:1} {Document your discussion with family members, caretakers, and with consultants:1} {Document social determinants of health affecting pt's care:1} {Document your decision making why or why not admission, treatments were needed:1} Final Clinical Impression(s) / ED Diagnoses Final diagnoses:  None    Rx / DC Orders ED Discharge Orders     None

## 2022-04-26 NOTE — BH Assessment (Signed)
@  23:55 - No H&P complete, labs pending - patient is not medically cleared for TTS assessment at this time.

## 2022-04-27 LAB — CBC WITH DIFFERENTIAL/PLATELET
Abs Immature Granulocytes: 0.05 10*3/uL (ref 0.00–0.07)
Basophils Absolute: 0.1 10*3/uL (ref 0.0–0.1)
Basophils Relative: 1 %
Eosinophils Absolute: 0.2 10*3/uL (ref 0.0–0.5)
Eosinophils Relative: 2 %
HCT: 40.2 % (ref 36.0–46.0)
Hemoglobin: 13.6 g/dL (ref 12.0–15.0)
Immature Granulocytes: 0 %
Lymphocytes Relative: 17 %
Lymphs Abs: 1.9 10*3/uL (ref 0.7–4.0)
MCH: 29.5 pg (ref 26.0–34.0)
MCHC: 33.8 g/dL (ref 30.0–36.0)
MCV: 87.2 fL (ref 80.0–100.0)
Monocytes Absolute: 0.8 10*3/uL (ref 0.1–1.0)
Monocytes Relative: 7 %
Neutro Abs: 8.3 10*3/uL — ABNORMAL HIGH (ref 1.7–7.7)
Neutrophils Relative %: 73 %
Platelets: 342 10*3/uL (ref 150–400)
RBC: 4.61 MIL/uL (ref 3.87–5.11)
RDW: 12.5 % (ref 11.5–15.5)
WBC: 11.3 10*3/uL — ABNORMAL HIGH (ref 4.0–10.5)
nRBC: 0 % (ref 0.0–0.2)

## 2022-04-27 LAB — ETHANOL: Alcohol, Ethyl (B): 14 mg/dL — ABNORMAL HIGH (ref <10)

## 2022-04-27 LAB — RAPID URINE DRUG SCREEN, HOSP PERFORMED
Amphetamines: NOT DETECTED
Barbiturates: NOT DETECTED
Benzodiazepines: NOT DETECTED
Cocaine: NOT DETECTED
Opiates: NOT DETECTED
Tetrahydrocannabinol: POSITIVE — AB

## 2022-04-27 LAB — COMPREHENSIVE METABOLIC PANEL
ALT: 18 U/L (ref 0–44)
AST: 22 U/L (ref 15–41)
Albumin: 4.2 g/dL (ref 3.5–5.0)
Alkaline Phosphatase: 57 U/L (ref 38–126)
Anion gap: 12 (ref 5–15)
BUN: 19 mg/dL (ref 6–20)
CO2: 21 mmol/L — ABNORMAL LOW (ref 22–32)
Calcium: 9.4 mg/dL (ref 8.9–10.3)
Chloride: 103 mmol/L (ref 98–111)
Creatinine, Ser: 1 mg/dL (ref 0.44–1.00)
GFR, Estimated: >60 mL/min (ref >60)
Glucose, Bld: 98 mg/dL (ref 70–99)
Potassium: 3.7 mmol/L (ref 3.5–5.1)
Sodium: 136 mmol/L (ref 135–145)
Total Bilirubin: 0.4 mg/dL (ref 0.3–1.2)
Total Protein: 7 g/dL (ref 6.5–8.1)

## 2022-04-27 LAB — RESP PANEL BY RT-PCR (FLU A&B, COVID) ARPGX2
Influenza A by PCR: NEGATIVE
Influenza B by PCR: NEGATIVE
SARS Coronavirus 2 by RT PCR: NEGATIVE

## 2022-04-27 LAB — I-STAT BETA HCG BLOOD, ED (MC, WL, AP ONLY): I-stat hCG, quantitative: <5 m[IU]/mL (ref <5)

## 2022-04-27 MED ORDER — LORAZEPAM 1 MG PO TABS
1.0000 mg | ORAL_TABLET | ORAL | Status: DC | PRN
  Administered 2022-04-27: 1 mg via ORAL
  Filled 2022-04-27: qty 4

## 2022-04-27 MED ORDER — ADULT MULTIVITAMIN W/MINERALS CH
1.0000 | ORAL_TABLET | Freq: Every day | ORAL | Status: DC
  Administered 2022-04-27: 1 via ORAL
  Filled 2022-04-27: qty 1

## 2022-04-27 MED ORDER — OXYMETAZOLINE HCL 0.05 % NA SOLN
1.0000 | Freq: Two times a day (BID) | NASAL | Status: DC
  Filled 2022-04-27: qty 30

## 2022-04-27 MED ORDER — LORAZEPAM 2 MG/ML IJ SOLN: 1.0000 mg | INTRAMUSCULAR | Status: DC | PRN

## 2022-04-27 MED ORDER — NICOTINE 21 MG/24HR TD PT24
21.0000 mg | MEDICATED_PATCH | Freq: Every day | TRANSDERMAL | Status: DC
  Administered 2022-04-27: 21 mg via TRANSDERMAL
  Filled 2022-04-27: qty 1

## 2022-04-27 MED ORDER — THIAMINE HCL 100 MG/ML IJ SOLN
100.0000 mg | Freq: Every day | INTRAMUSCULAR | Status: DC
  Filled 2022-04-27: qty 1

## 2022-04-27 MED ORDER — FOLIC ACID 1 MG PO TABS
1.0000 mg | ORAL_TABLET | Freq: Every day | ORAL | Status: DC
  Administered 2022-04-27: 1 mg via ORAL
  Filled 2022-04-27: qty 1

## 2022-04-27 MED ORDER — THIAMINE HCL 100 MG PO TABS
100.0000 mg | ORAL_TABLET | Freq: Every day | ORAL | Status: DC
  Administered 2022-04-27: 100 mg via ORAL
  Filled 2022-04-27: qty 1

## 2022-04-27 NOTE — ED Notes (Addendum)
Discharge instructions reviewed and education provided. All questions answered. Phone call received that mother arrived. Pt denies SI/HI/AVH and contracts for safety. This nurse walked pt out to mother in lobby in stable condition with all belongings
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
## 2022-04-27 NOTE — ED Notes (Signed)
IVC paper work in blue zone 

## 2022-04-27 NOTE — ED Notes (Signed)
Pt signed for belongings and is changing into clothes. Per pt's mother, she cannot get here to pick up pt until 1630 and was told pt could wait in room. This nurse explained to pt and mother that I would ask the charge nurse d/t other patients potentially needing a room. Charge RN stated it was fine for patient to wait for now.
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
## 2022-04-27 NOTE — ED Provider Notes (Addendum)
Emergency Medicine Observation Re-evaluation Note  Stacy Poole is a 31 y.o. female, seen on rounds today.  Pt initially presented to the ED for complaints of Manic Behavior Currently, the patient is calm and eating breakfast.  She said she feels much better after sleeping well and getting back on her meds.  Physical Exam  BP 124/82 (BP Location: Right Arm)   Pulse 93   Temp 97.8 F (36.6 C) (Oral)   Resp 17   SpO2 99%  Physical Exam General: awake and alert Cardiac: rrr Lungs: cta b Psych: calm  ED Course / MDM  EKG:   I have reviewed the labs performed to date as well as medications administered while in observation.  Recent changes in the last 24 hours include awaiting TTS eval.  Plan  Current plan is for awaiting TTS consult. Stacy Poole is under involuntary commitment.      Jacalyn Lefevre, MD 04/27/22 2505    Jacalyn Lefevre, MD 04/27/22 1038  Pt has been psych cleared.  She is stable for d/c.  IVC rescinded.   Jacalyn Lefevre, MD 04/27/22 1435    Jacalyn Lefevre, MD 04/27/22 1435

## 2022-04-27 NOTE — Progress Notes (Signed)
Substance abuse resources added to patients AVS. Patient is being evaluated by psychiatry at this time.

## 2022-04-27 NOTE — BH Assessment (Signed)
@  1115, requested patient's nurse Zollie Scale, RN) to set up the TTS machine for patient's initial TTS assessment. Received a reply that the machine is in use. Zollie Scale, RN will notify TTS when the machine is available.

## 2022-04-27 NOTE — ED Notes (Signed)
Okey Regal, mother, [phone] would like an update when available

## 2022-04-27 NOTE — ED Notes (Signed)
TTS in progress 

## 2022-05-01 ENCOUNTER — Emergency Department (HOSPITAL_COMMUNITY)
Admission: EM | Admit: 2022-05-01 | Discharge: 2022-05-02 | Disposition: A | Discharge status: Other Acute Inpatient Hospital | Payer: BC Managed Care – PPO | Attending: Emergency Medicine | Admitting: Emergency Medicine

## 2022-05-01 ENCOUNTER — Other Ambulatory Visit: Payer: Self-pay

## 2022-05-01 DIAGNOSIS — Z20822 Contact with and (suspected) exposure to covid-19: Secondary | ICD-10-CM | POA: Diagnosis not present

## 2022-05-01 DIAGNOSIS — F29 Unspecified psychosis not due to a substance or known physiological condition: Secondary | ICD-10-CM

## 2022-05-01 DIAGNOSIS — R45851 Suicidal ideations: Secondary | ICD-10-CM | POA: Insufficient documentation

## 2022-05-01 DIAGNOSIS — F4310 Post-traumatic stress disorder, unspecified: Secondary | ICD-10-CM | POA: Diagnosis present

## 2022-05-01 DIAGNOSIS — F250 Schizoaffective disorder, bipolar type: Secondary | ICD-10-CM | POA: Diagnosis present

## 2022-05-01 LAB — COMPREHENSIVE METABOLIC PANEL
ALT: 17 U/L (ref 0–44)
AST: 17 U/L (ref 15–41)
Albumin: 4.1 g/dL (ref 3.5–5.0)
Alkaline Phosphatase: 54 U/L (ref 38–126)
Anion gap: 7 (ref 5–15)
BUN: 6 mg/dL (ref 6–20)
CO2: 27 mmol/L (ref 22–32)
Calcium: 9.3 mg/dL (ref 8.9–10.3)
Chloride: 105 mmol/L (ref 98–111)
Creatinine, Ser: 0.8 mg/dL (ref 0.44–1.00)
GFR, Estimated: >60 mL/min (ref >60)
Glucose, Bld: 99 mg/dL (ref 70–99)
Potassium: 4.1 mmol/L (ref 3.5–5.1)
Sodium: 139 mmol/L (ref 135–145)
Total Bilirubin: 0.5 mg/dL (ref 0.3–1.2)
Total Protein: 6.9 g/dL (ref 6.5–8.1)

## 2022-05-01 LAB — CBC
HCT: 38.7 % (ref 36.0–46.0)
Hemoglobin: 13.1 g/dL (ref 12.0–15.0)
MCH: 29.8 pg (ref 26.0–34.0)
MCHC: 33.9 g/dL (ref 30.0–36.0)
MCV: 88 fL (ref 80.0–100.0)
Platelets: 317 10*3/uL (ref 150–400)
RBC: 4.4 MIL/uL (ref 3.87–5.11)
RDW: 12.2 % (ref 11.5–15.5)
WBC: 6.6 10*3/uL (ref 4.0–10.5)
nRBC: 0 % (ref 0.0–0.2)

## 2022-05-01 LAB — RAPID URINE DRUG SCREEN, HOSP PERFORMED
Amphetamines: NOT DETECTED
Barbiturates: NOT DETECTED
Benzodiazepines: NOT DETECTED
Cocaine: NOT DETECTED
Opiates: NOT DETECTED
Tetrahydrocannabinol: POSITIVE — AB

## 2022-05-01 LAB — ETHANOL: Alcohol, Ethyl (B): <10 mg/dL (ref <10)

## 2022-05-01 LAB — ACETAMINOPHEN LEVEL: Acetaminophen (Tylenol), Serum: <10 ug/mL — ABNORMAL LOW (ref 10–30)

## 2022-05-01 LAB — I-STAT BETA HCG BLOOD, ED (MC, WL, AP ONLY): I-stat hCG, quantitative: <5 m[IU]/mL (ref <5)

## 2022-05-01 LAB — SALICYLATE LEVEL: Salicylate Lvl: <7 mg/dL — ABNORMAL LOW (ref 7.0–30.0)

## 2022-05-01 MED ORDER — LORAZEPAM 1 MG PO TABS
1.0000 mg | ORAL_TABLET | Freq: Once | ORAL | Status: AC
  Administered 2022-05-01: 1 mg via ORAL
  Filled 2022-05-01: qty 1
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
## 2022-05-01 NOTE — ED Notes (Signed)
Stacy Poole mother [phone] wants to speak to the patient

## 2022-05-01 NOTE — ED Notes (Signed)
Pt dressed out in purple scrubs.  

## 2022-05-01 NOTE — ED Provider Triage Note (Signed)
Emergency Medicine Provider Triage Evaluation Note  Stacy Poole , a 30 y.o. female  was evaluated in triage.  Pt complains of concerns for psychiatric evaluation.  Patient called 911 and noted that she felt like she was being poisoned by her family.  Also: 1 that she felt that she was suicidal.  Denies SI, HI, auditory/visual hallucinations today.  Patient in triage noting that she has been poisoned.  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   Review of Systems  Positive: As per HPI Negative:   Physical Exam  BP (!) 147/105 (BP Location: Right Arm)   Pulse (!) 105   Temp 98.7 F (37.1 C) (Oral)   Resp 20   SpO2 98%  Gen:   Awake, no distress   Resp:  Normal effort  MSK:   Moves extremities without difficulty  Other:    Medical Decision Making  Medically screening exam initiated at 1:52 PM.  Appropriate orders placed.  Stacy Poole was informed that the remainder of the evaluation will be completed by another provider, this initial triage assessment does not replace that evaluation, and the importance of remaining in the ED until their evaluation is complete.  Patient here voluntarily, patient placed in psych hold.work-up initiated.  TTS consult pending.    Noora Locascio A, PA-C 05/01/22 1359

## 2022-05-01 NOTE — ED Triage Notes (Signed)
Patient here acting psychotically. Patient called 911 and told dispatchers she is suicidal. Patient states she feels like she is being poisoned by family. 

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
## 2022-05-01 NOTE — ED Notes (Signed)
Patient placed in room 49, belongings in personal belongings bag behind the chart.

## 2022-05-01 NOTE — ED Provider Notes (Signed)
[facility] EMERGENCY DEPARTMENT Provider Note   CSN: [id number] Arrival date & time: 05/01/22  1234     History  Chief Complaint  Patient presents with   Psychiatric Evaluation   Suicidal    Stacy Poole is a 31 y.o. female who presents to the ED complaining of psych eval brought in by [employer]. Has been evaluated in the ED multiple times for this before in the past. Patient called 911 and noted that she felt like she was being poisoned by her family.  Also: 1 that she felt that she was suicidal.  Denies SI, HI, auditory/visual hallucinations today.  Patient in triage noting that she has been poisoned.  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  The history is provided by the patient. No language interpreter was used.       Home Medications Prior to Admission medications   Medication Sig Start Date End Date Taking? Authorizing Provider  Ascorbic Acid (VITAMIN C PO) Take 1 tablet by mouth daily.    [provider]  carbamazepine (TEGRETOL) 200 MG tablet Take 200 mg by mouth in the morning and at bedtime. 03/24/22   [provider]  carbamazepine (TEGRETOL-XR) 200 MG 12 hr tablet Take 1 tablet (200 mg total) by mouth 2 (two) times daily. Patient not taking: Reported on 04/10/2022 01/15/22 01/15/23  Earney Navy, NP  cloNIDine (CATAPRES) 0.1 MG tablet Take 1 tablet (0.1 mg total) by mouth 2 (two) times daily. Patient not taking: Reported on 04/13/2022 09/05/21 04/13/22  Roselle Locus, MD  cloNIDine HCl (KAPVAY) 0.1 MG TB12 ER tablet Take 0.1 mg by mouth at bedtime. 03/24/22   [provider]  escitalopram (LEXAPRO) 10 MG tablet Take 10 mg by mouth at bedtime. 03/22/22   [provider]  hydrOXYzine (ATARAX) 25 MG tablet Take 1 tablet (25 mg total) by mouth 3 (three) times daily as needed for anxiety. Patient taking  differently: Take 25 mg by mouth 3 (three) times daily as needed for anxiety (or agitation). 09/05/21   Roselle Locus, MD  ibuprofen (ADVIL) 200 MG tablet Take 400-600 mg by mouth every 6 (six) hours as needed (pain or headaches).    [provider]  Multiple Vitamin (MULTIVITAMIN) tablet Take 1 tablet by mouth daily with breakfast.    [provider]  OLANZapine (ZYPREXA) 10 MG tablet Take 1 tablet (10 mg total) by mouth at bedtime. Patient not taking: Reported on 04/13/2022 01/15/22 04/13/22  Earney Navy, NP  OLANZapine (ZYPREXA) 7.5 MG tablet Take 7.5 mg by mouth at bedtime. 03/24/22   [provider]  Omega-3 Fatty Acids (FISH OIL PO) Take 1 capsule by mouth daily.    [provider]  Oxcarbazepine (TRILEPTAL) 300 MG tablet Take 300 mg by mouth 2 (two) times daily. 04/24/22   [provider]  traZODone (DESYREL) 50 MG tablet Take 50 mg by mouth at bedtime.    [provider]  Vitamin D3 (VITAMIN D) 25 MCG tablet Take 1 tablet (1,000 Units total) by mouth daily. Patient not taking: Reported on 04/13/2022 09/06/21   Roselle Locus, MD      Allergies    Abilify [aripiprazole], Lamictal [lamotrigine], Carbamazepine, Other, Pork-derived products, and Tetracyclines & related    Review of Systems   Review of Systems  Physical Exam Updated Vital Signs BP Marland Kitchen)  147/105 (BP Location: Right Arm)   Pulse (!) 105   Temp 98.7 F (37.1 C) (Oral)   Resp 20   SpO2 98%  Physical Exam  ED Results / Procedures / Treatments   Labs (all labs ordered are listed, but only abnormal results are displayed) Labs Reviewed  SALICYLATE LEVEL - Abnormal; Notable for the following components:      Result Value   Salicylate Lvl <7.0 (*)    All other components within normal limits  ACETAMINOPHEN LEVEL - Abnormal; Notable for the following components:   Acetaminophen (Tylenol), Serum <10 (*)    All other components within normal limits  RAPID  URINE DRUG SCREEN, HOSP PERFORMED - Abnormal; Notable for the following components:   Tetrahydrocannabinol POSITIVE (*)    All other components within normal limits  RESP PANEL BY RT-PCR (FLU A&B, COVID) ARPGX2  COMPREHENSIVE METABOLIC PANEL  ETHANOL  CBC  I-STAT BETA HCG BLOOD, ED ([facility], [facility], [facility] ONLY)    EKG None  Radiology No results found.  Procedures Procedures    Medications Ordered in ED Medications  LORazepam (ATIVAN) tablet 1 mg (1 mg Oral Given 05/01/22 1530)    ED Course/ Medical Decision Making/ A&P                           Medical Decision Making Amount and/or Complexity of Data Reviewed Labs: ordered.  Risk Prescription drug management.   Pt brought in by [employer] for concerns for psychosis. Noted that she called 911 and noted that she was suicidal. Also feels that she has been poisoned. Pt afebrile.  On exam, pt with no acute cardiovascular, respiratory, abdominal exam findings.   Labs:  I ordered, and personally interpreted labs.  The pertinent results include:   acetaminophen, salicylate, ethanol unremarkable UDS notable for THC CBC, CMP, unremarkable  Medications:  I ordered medication including ativan  I have reviewed the patients home medicines and have made adjustments as needed   Disposition: Presentation suspicious for psychosis. IVC paperwork in place. Labs without acute findings, patient medically cleared at this time. TTS consult placed. Dispo as per TTS team.    This chart was dictated using voice recognition software, Dragon. Despite the best efforts of this provider to proofread and correct errors, errors may still occur which can change documentation meaning.   Final Clinical Impression(s) / ED Diagnoses Final diagnoses:  Psychosis, unspecified psychosis type [facility])    Rx / DC Orders ED Discharge Orders     None         Jisela Merlino A, PA-C 05/01/22 2200    Sloan Leiter, DO 05/05/22 [phone]

## 2022-05-02 DIAGNOSIS — F250 Schizoaffective disorder, bipolar type: Secondary | ICD-10-CM

## 2022-05-02 LAB — SARS CORONAVIRUS 2 BY RT PCR: SARS Coronavirus 2 by RT PCR: NEGATIVE

## 2022-05-02 MED ORDER — CLONIDINE HCL 0.2 MG PO TABS: 0.1000 mg | ORAL_TABLET | Freq: Every day | ORAL | Status: DC

## 2022-05-02 MED ORDER — CARBAMAZEPINE ER 100 MG PO TB12
100.0000 mg | ORAL_TABLET | Freq: Two times a day (BID) | ORAL | Status: DC
  Filled 2022-05-02 (×3): qty 1

## 2022-05-02 MED ORDER — LORAZEPAM 1 MG PO TABS
1.0000 mg | ORAL_TABLET | Freq: Once | ORAL | Status: AC
  Administered 2022-05-02: 1 mg via ORAL
  Filled 2022-05-02: qty 1

## 2022-05-02 MED ORDER — ACETAMINOPHEN 325 MG PO TABS
650.0000 mg | ORAL_TABLET | Freq: Four times a day (QID) | ORAL | Status: DC | PRN
  Administered 2022-05-02: 650 mg via ORAL
  Filled 2022-05-02: qty 2

## 2022-05-02 MED ORDER — OLANZAPINE 10 MG PO TABS: 10.0000 mg | ORAL_TABLET | Freq: Every day | ORAL | Status: DC

## 2022-05-02 NOTE — Progress Notes (Signed)
Inpatient Behavioral Health Placement  Pt meets inpatient criteria per Eligha Bridegroom, NP.  There are no available beds at [facility] per [facility], RN. Referral was sent to the following facilities;  Destination Service Provider Address Phone Fax  [facility]  [address] [place], [place] [address] [phone]  [facility]  [address] [place] [address] [phone]  [facility]  [address] [place] [address] [phone]  [facility]  [address] [place] [address] [phone]  [facility]  [address] [place] [address] [phone]  [facility]  [address] [place] [address] [phone]  [facility]  [address] [place] [address] [phone]  [facility]  [address] [place] [address] [phone]  [facility]  [address] [place] [address] [phone]  [facility]  [address] [place] [address] [phone]  [facility]  [address], [place] [address] [phone]  [facility]  [address], [place] [address] [phone]  [facility]  [address] [place] [address] [phone]  [facility]  [address] [place] [address] [phone]   Situation ongoing,  CSW will follow up.   Maryjean Ka, MSW, [facility] 05/02/2022  @ 12:02 PM

## 2022-05-02 NOTE — ED Notes (Signed)
Called [facility] again to attempt report

## 2022-05-02 NOTE — ED Notes (Signed)
Pt interacting with other pt. Singing in room. Unable to sit still pacing back and forth in room with a flight of ideas.

## 2022-05-02 NOTE — ED Notes (Signed)
Updated pts mother on pt status. Educated mother on being a current trigger for her. Mother was receptive to information given.

## 2022-05-02 NOTE — Progress Notes (Signed)
Pt was accepted to [facility] Today 05/02/22 PENDING Negative COVID-19; [facility]  Pt meets inpatient criteria per Eligha Bridegroom, NP  Attending Physician will be Dr. Deatra James  Report can be called to: [phone]  Pt can arrive: BED IS READY PENDING NEGATIVE COVID-19  Care Team notified: Eligha Bridegroom, NP, and Sophronia Simas, RN.  Kelton Pillar, LCSWA 05/02/2022 @ 12:23 PM

## 2022-05-02 NOTE — Progress Notes (Signed)
Pt under review at [facility]. CSW will assist and follow with placement.  Maryjean Ka, MSW, [facility] 05/02/2022 12:09 PM

## 2022-05-02 NOTE — ED Notes (Signed)
Charge Nurse notified about delay in Report to Facility.
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
## 2022-05-02 NOTE — ED Notes (Signed)
Pt's lunch has arrived, pt sitting in bed

## 2022-05-02 NOTE — ED Provider Notes (Signed)
Emergency Medicine Observation Re-evaluation Note  Stacy Poole is a 31 y.o. female, seen on rounds today.  Pt initially presented to the ED for complaints of Psychiatric Evaluation and Suicidal Currently, the patient is sitting on bed.  Physical Exam  BP 109/66 (BP Location: Right Arm)   Pulse 68   Temp 98 F (36.7 C) (Oral)   Resp 18   SpO2 97%  Physical Exam General: Awake, alert, nondistressed Cardiac: Extremities well-perfused Lungs: Breathing is unlabored Psych: No agitation, does not appear to be responding to internal stimuli  ED Course / MDM  EKG:   I have reviewed the labs performed to date as well as medications administered while in observation.  Recent changes in the last 24 hours include patient presented to the ED yesterday with DVT, paranoia, and delusions.  She was evaluated by TTS last night.  Plan was for reevaluation this morning.  Plan  Current plan is for TTS reevaluation.    Gloris Manchester, MD 05/02/22 [phone]

## 2022-05-02 NOTE — ED Notes (Signed)
Pt in hall crying because medication ordered was incorrect. Requesting to speak to NP. 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 Pt reorients to return to room. PT compliant at this time.

## 2022-05-02 NOTE — ED Notes (Addendum)
Pt ambulated to restroom with a steady gait

## 2022-12-21 IMAGING — CT CT HEAD W/O CM
4 series · 16 of 47 positions shown, 18 images · non-contrast
Comparison: Brain MRI 08/22/2013.

CLINICAL DATA: Memory loss; patient reports increased brain fog and
history of pseudotumor cerebri.

EXAM:
CT HEAD WITHOUT CONTRAST
TECHNIQUE: Contiguous axial images were obtained from the base of the skull
through the vertex without intravenous contrast.

[Series 2: head wo · axial · 0.42mm/px · z∈[+1357,+1472]mm · 7 of 31 slices shown, 9 images]
[im 4/31  brain]
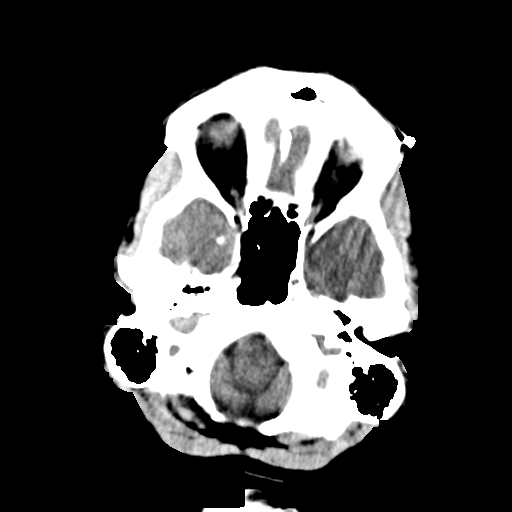
[im 4/31  bone]
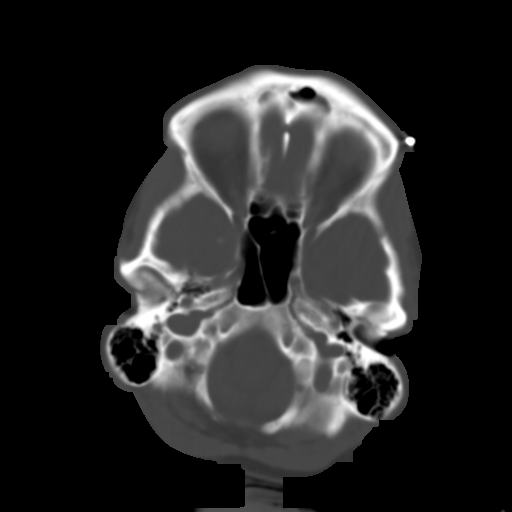
[im 8/31  brain]
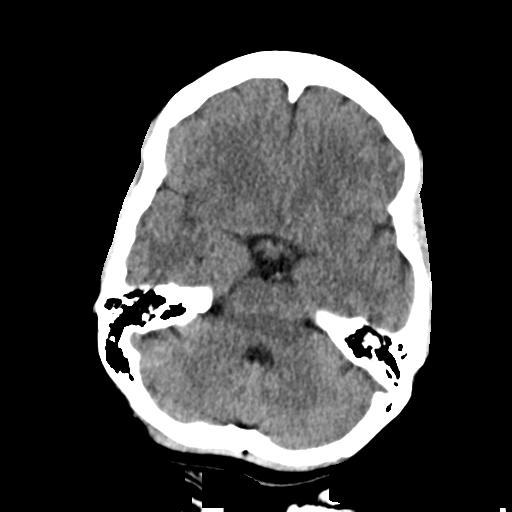
[im 12/31  brain]
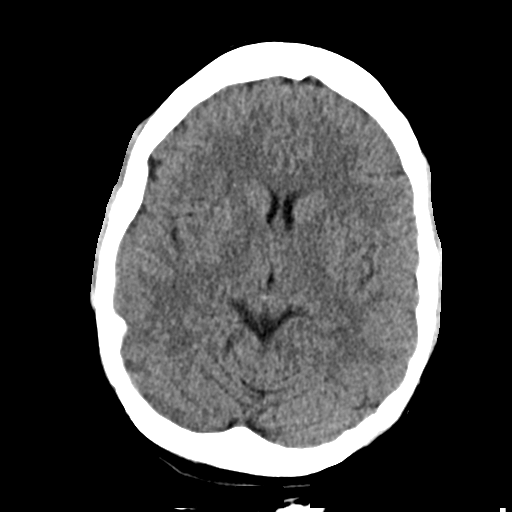
[im 16/31  brain]
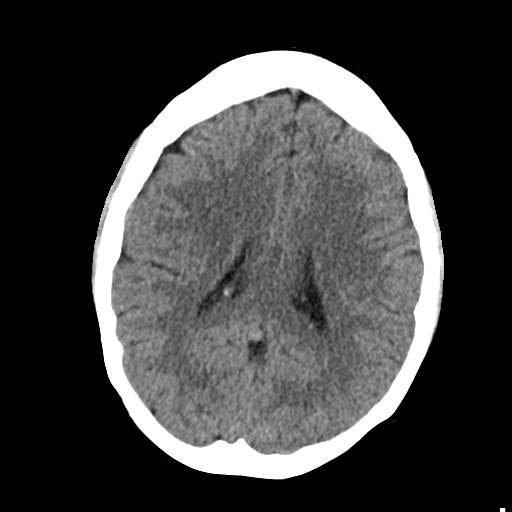
[im 19/31  brain]
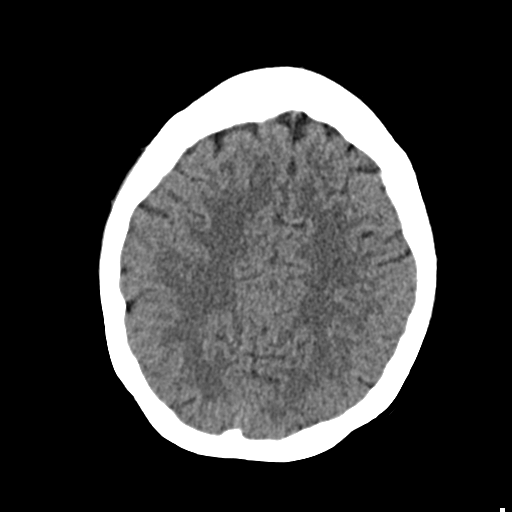
[im 19/31  bone]
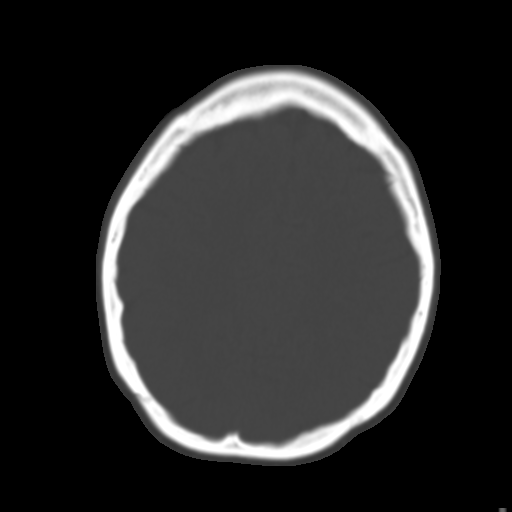
[im 23/31  brain]
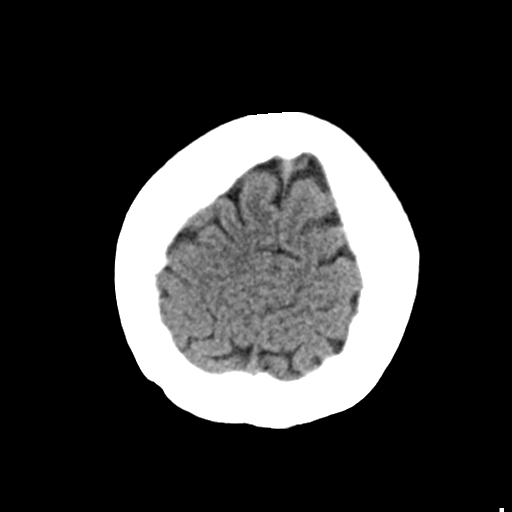
[im 27/31  brain]
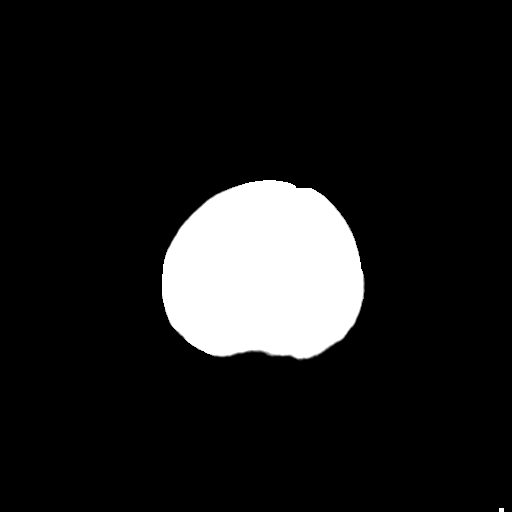

[Series 3: head bone · axial · 0.42mm/px · z∈[+1356,+1386]mm · 3 of 77 slices shown]
[im 8/77  bone]
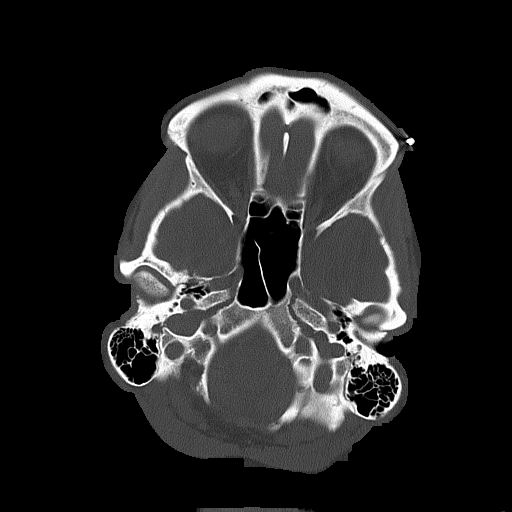
[im 16/77  bone]
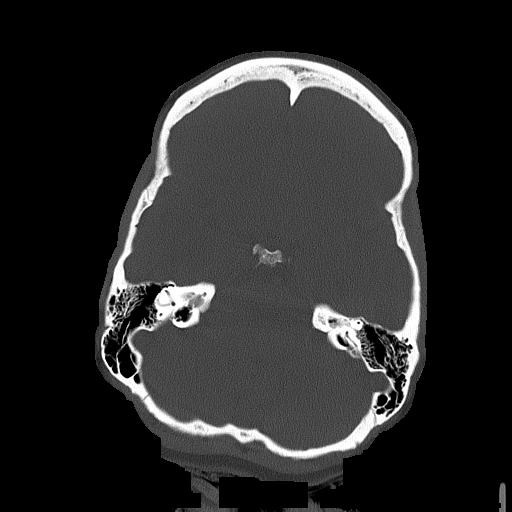
[im 23/77  bone]
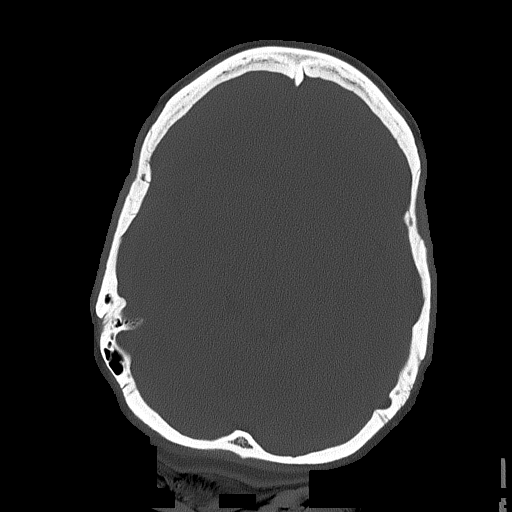

[Series 4: coronal soft tissue · coronal · 0.29mm/px · 3 of 65 slices shown]
[im 22/65  brain]
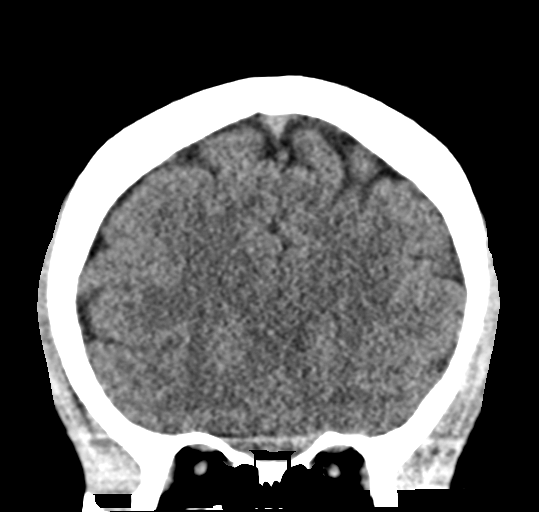
[im 29/65  brain]
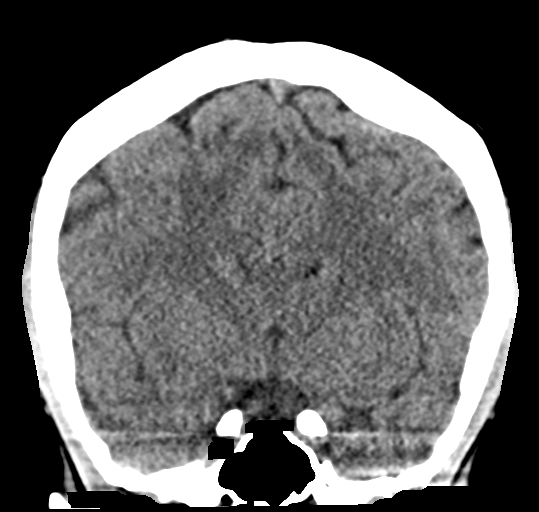
[im 36/65  brain]
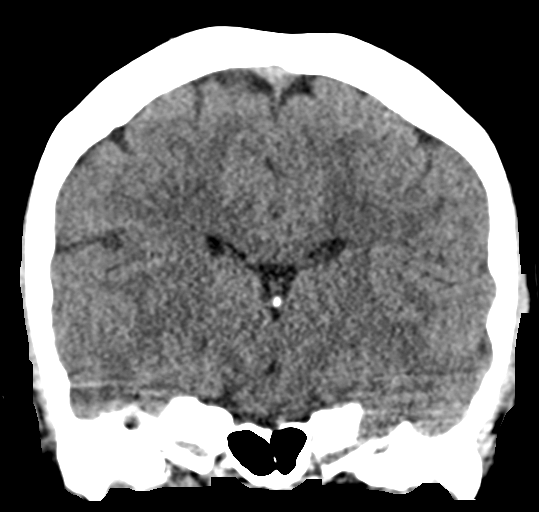

[Series 5: sagittal soft tissue · sagittal · 0.29mm/px · 3 of 53 slices shown]
[im 18/53  brain]
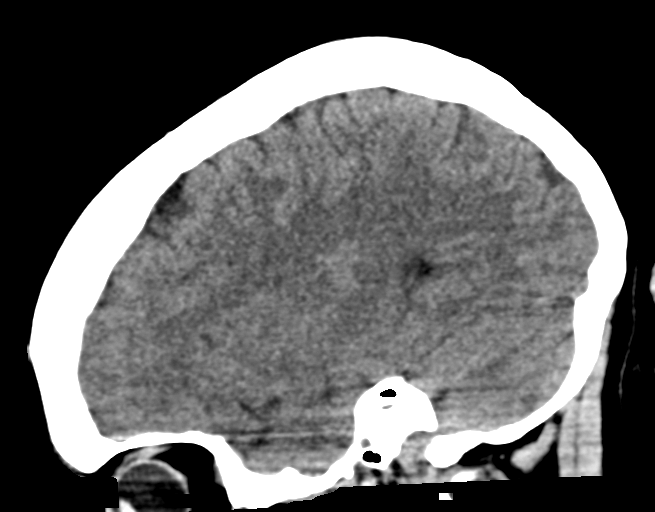
[im 27/53  brain]
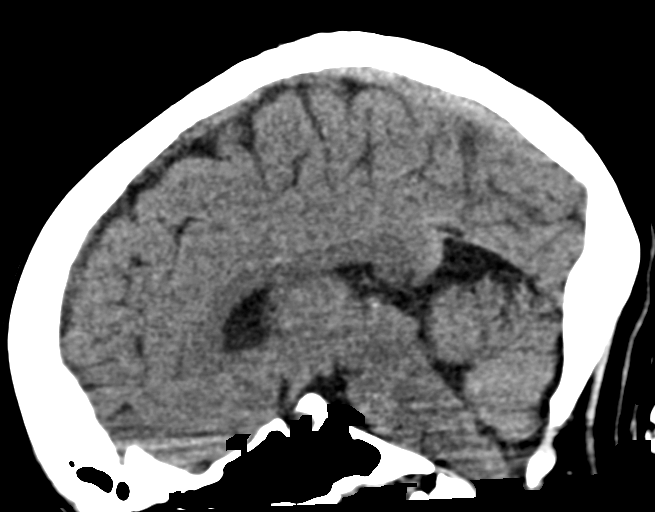
[im 35/53  brain]
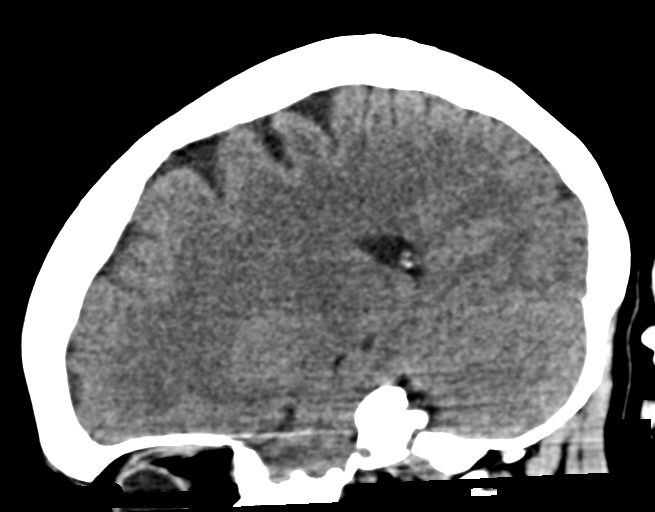

[16 of 47 positions shown; findings below may reference images not displayed]

FINDINGS: Brain:

Cerebral volume is normal.

Small arachnoid cyst overlying the left frontal lobe convexity,
measuring approximately 17 x 15 mm, unchanged from the brain MRI of
08/22/2013.

There is no acute intracranial hemorrhage.

No demarcated cortical infarct.

No extra-axial fluid collection.

No evidence of an intracranial mass.

No midline shift.

Vascular: No hyperdense vessel.

Skull: No calvarial fracture or focal suspicious osseous lesion.
Unchanged subtle chronic bony remodeling overlying the left frontal
arachnoid cyst.

Sinuses/Orbits: Visualized orbits show no acute finding. No
significant paranasal sinus disease.
IMPRESSION: No evidence of acute intracranial abnormality.

Small arachnoid cyst overlying the left frontal lobe convexity,
unchanged from the brain MRI of 08/22/2013.

Otherwise unremarkable non-contrast CT appearance of the brain.

## 2023-09-12 ENCOUNTER — Other Ambulatory Visit: Payer: Self-pay

## 2023-09-12 ENCOUNTER — Encounter (HOSPITAL_COMMUNITY): Payer: Self-pay | Admitting: Emergency Medicine

## 2023-09-12 ENCOUNTER — Emergency Department (HOSPITAL_COMMUNITY)
Admission: EM | Admit: 2023-09-12 | Discharge: 2023-09-12 | Discharge status: Against Medical Advice | Payer: Medicaid Other | Attending: Emergency Medicine | Admitting: Emergency Medicine

## 2023-09-12 DIAGNOSIS — N939 Abnormal uterine and vaginal bleeding, unspecified: Secondary | ICD-10-CM | POA: Insufficient documentation

## 2023-09-12 DIAGNOSIS — R42 Dizziness and giddiness: Secondary | ICD-10-CM | POA: Insufficient documentation

## 2023-09-12 DIAGNOSIS — Z5321 Procedure and treatment not carried out due to patient leaving prior to being seen by health care provider: Secondary | ICD-10-CM | POA: Insufficient documentation

## 2023-09-12 LAB — CBC WITH DIFFERENTIAL/PLATELET
Abs Immature Granulocytes: 0.02 10*3/uL (ref 0.00–0.07)
Basophils Absolute: 0 10*3/uL (ref 0.0–0.1)
Basophils Relative: 1 %
Eosinophils Absolute: 0 10*3/uL (ref 0.0–0.5)
Eosinophils Relative: 0 %
HCT: 39.7 % (ref 36.0–46.0)
Hemoglobin: 13 g/dL (ref 12.0–15.0)
Immature Granulocytes: 0 %
Lymphocytes Relative: 34 %
Lymphs Abs: 2 10*3/uL (ref 0.7–4.0)
MCH: 27.6 pg (ref 26.0–34.0)
MCHC: 32.7 g/dL (ref 30.0–36.0)
MCV: 84.3 fL (ref 80.0–100.0)
Monocytes Absolute: 0.6 10*3/uL (ref 0.1–1.0)
Monocytes Relative: 10 %
Neutro Abs: 3.2 10*3/uL (ref 1.7–7.7)
Neutrophils Relative %: 55 %
Platelets: 317 10*3/uL (ref 150–400)
RBC: 4.71 MIL/uL (ref 3.87–5.11)
RDW: 13.1 % (ref 11.5–15.5)
WBC: 5.8 10*3/uL (ref 4.0–10.5)
nRBC: 0 % (ref 0.0–0.2)

## 2023-09-12 LAB — COMPREHENSIVE METABOLIC PANEL
ALT: 17 U/L (ref 0–44)
AST: 18 U/L (ref 15–41)
Albumin: 4.2 g/dL (ref 3.5–5.0)
Alkaline Phosphatase: 42 U/L (ref 38–126)
Anion gap: 13 (ref 5–15)
BUN: 10 mg/dL (ref 6–20)
CO2: 22 mmol/L (ref 22–32)
Calcium: 9.6 mg/dL (ref 8.9–10.3)
Chloride: 101 mmol/L (ref 98–111)
Creatinine, Ser: 0.83 mg/dL (ref 0.44–1.00)
GFR, Estimated: >60 mL/min (ref >60)
Glucose, Bld: 113 mg/dL — ABNORMAL HIGH (ref 70–99)
Potassium: 3.9 mmol/L (ref 3.5–5.1)
Sodium: 136 mmol/L (ref 135–145)
Total Bilirubin: 0.9 mg/dL (ref 0.0–1.2)
Total Protein: 7.5 g/dL (ref 6.5–8.1)

## 2023-09-12 LAB — HCG, SERUM, QUALITATIVE: Preg, Serum: NEGATIVE

## 2023-09-12 LAB — TYPE AND SCREEN
ABO/RH(D): A NEG
Antibody Screen: NEGATIVE

## 2023-09-12 NOTE — ED Notes (Signed)
 Pt reports she took one of her home doses of Klonopin just now.

## 2023-09-12 NOTE — ED Provider Triage Note (Signed)
 Emergency Medicine Provider Triage Evaluation Note  Stacy Poole , a 33 y.o. female  was evaluated in triage.  Pt complains of vaginal bleeding, vaginal cyst.  She is accompanied by her mom.  Patient does have history of schizophrenia.  She states she has not had a pelvic exam since she was 33 years old.  She states if she requires a pelvic exam she prefers only female providers. Denies associated abdominal pain.  Does endorse lightheadedness since yesterday.  Mom states that she did have a lot of bleeding earlier today which is why they came into the emergency department.  Review of Systems  Positive: As above Negative: As above  Physical Exam  BP (!) 150/100 (BP Location: Right Arm)   Pulse 100   Temp 99 F (37.2 C) (Oral)   Resp 17   Ht 5' 3.5 (1.613 m)   Wt 63.5 kg   SpO2 100%   BMI 24.41 kg/m  Gen:   Awake, no distress   Resp:  Normal effort  MSK:   Moves extremities without difficulty  Other:    Medical Decision Making  Medically screening exam initiated at 5:50 PM.  Appropriate orders placed.  Stacy Poole was informed that the remainder of the evaluation will be completed by another provider, this initial triage assessment does not replace that evaluation, and the importance of remaining in the ED until their evaluation is complete.    Hildegard Loge, PA-C 09/12/23 1752

## 2023-09-12 NOTE — ED Triage Notes (Addendum)
 Pt here for vaginal bleeding, reports hx of vaginal cyst. States she has been bleeding for 8 days. States she has not seen an OB since she was 60 due to PTSD. Reports a hx of schizophrenia. Pt requesting a female if pelvic exam is needed.

## 2023-09-21 ENCOUNTER — Encounter: Payer: Self-pay | Admitting: Obstetrics and Gynecology

## 2023-09-21 ENCOUNTER — Ambulatory Visit (INDEPENDENT_AMBULATORY_CARE_PROVIDER_SITE_OTHER): Payer: Medicaid Other | Admitting: Obstetrics and Gynecology

## 2023-09-21 ENCOUNTER — Other Ambulatory Visit (HOSPITAL_COMMUNITY)
Admission: RE | Admit: 2023-09-21 | Discharge: 2023-09-21 | Disposition: A | Discharge status: Home / Self Care | Payer: Medicaid Other | Source: Ambulatory Visit | Attending: Obstetrics and Gynecology | Admitting: Obstetrics and Gynecology

## 2023-09-21 ENCOUNTER — Encounter: Payer: Self-pay | Admitting: *Deleted

## 2023-09-21 VITALS — BP 122/76 | HR 98 | Ht 63.0 in | Wt 141.0 lb

## 2023-09-21 DIAGNOSIS — N760 Acute vaginitis: Secondary | ICD-10-CM | POA: Diagnosis not present

## 2023-09-21 DIAGNOSIS — R3989 Other symptoms and signs involving the genitourinary system: Secondary | ICD-10-CM | POA: Diagnosis not present

## 2023-09-21 DIAGNOSIS — N3944 Nocturnal enuresis: Secondary | ICD-10-CM | POA: Diagnosis not present

## 2023-09-21 DIAGNOSIS — Z124 Encounter for screening for malignant neoplasm of cervix: Secondary | ICD-10-CM

## 2023-09-21 DIAGNOSIS — B9689 Other specified bacterial agents as the cause of diseases classified elsewhere: Secondary | ICD-10-CM

## 2023-09-21 DIAGNOSIS — N939 Abnormal uterine and vaginal bleeding, unspecified: Secondary | ICD-10-CM

## 2023-09-21 DIAGNOSIS — N898 Other specified noninflammatory disorders of vagina: Secondary | ICD-10-CM

## 2023-09-21 DIAGNOSIS — Z113 Encounter for screening for infections with a predominantly sexual mode of transmission: Secondary | ICD-10-CM

## 2023-09-21 LAB — URINALYSIS, COMPLETE W/RFL CULTURE
Bacteria, UA: NONE SEEN /[HPF]
Bilirubin Urine: NEGATIVE
Glucose, UA: NEGATIVE
Hgb urine dipstick: NEGATIVE
Hyaline Cast: NONE SEEN /[LPF]
Ketones, ur: NEGATIVE
Leukocyte Esterase: NEGATIVE
Nitrites, Initial: NEGATIVE
Protein, ur: NEGATIVE
RBC / HPF: NONE SEEN /[HPF] (ref 0–2)
Specific Gravity, Urine: 1.01 (ref 1.001–1.035)
WBC, UA: NONE SEEN /[HPF] (ref 0–5)
pH: 7 (ref 5.0–8.0)

## 2023-09-21 LAB — NO CULTURE INDICATED
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
## 2023-09-22 LAB — HEPATITIS C ANTIBODY: Hepatitis C Ab: NONREACTIVE

## 2023-09-22 LAB — PROLACTIN: Prolactin: 11.5 ng/mL

## 2023-09-22 LAB — SURESWAB® ADVANCED VAGINITIS PLUS,TMA
C. trachomatis RNA, TMA: NOT DETECTED
CANDIDA SPECIES: NOT DETECTED
Candida glabrata: NOT DETECTED
N. gonorrhoeae RNA, TMA: NOT DETECTED
SURESWAB(R) ADV BACTERIAL VAGINOSIS(BV),TMA: POSITIVE — AB
TRICHOMONAS VAGINALIS (TV),TMA: NOT DETECTED

## 2023-09-22 LAB — FSH/LH
FSH: 6.4 m[IU]/mL
LH: 8.4 m[IU]/mL

## 2023-09-22 LAB — THYROID PANEL WITH TSH
Free Thyroxine Index: 2.8 (ref 1.4–3.8)
T3 Uptake: 32 % (ref 22–35)
T4, Total: 8.8 ug/dL (ref 5.1–11.9)
TSH: 1.18 m[IU]/L

## 2023-09-22 LAB — HEPATITIS B SURFACE ANTIGEN: Hepatitis B Surface Ag: NONREACTIVE

## 2023-09-22 LAB — HIV ANTIBODY (ROUTINE TESTING W REFLEX): HIV 1&2 Ab, 4th Generation: NONREACTIVE

## 2023-09-22 LAB — RPR: RPR Ser Ql: NONREACTIVE

## 2023-09-24 MED ORDER — METRONIDAZOLE 500 MG PO TABS: 500.0000 mg | ORAL_TABLET | Freq: Two times a day (BID) | ORAL | 0 refills | Status: AC

## 2023-09-26 LAB — CYTOLOGY - PAP
Comment: NEGATIVE
Diagnosis: NEGATIVE
High risk HPV: NEGATIVE

## 2023-10-15 ENCOUNTER — Other Ambulatory Visit: Payer: Self-pay | Admitting: Obstetrics and Gynecology

## 2023-10-15 ENCOUNTER — Telehealth: Payer: Self-pay

## 2023-10-15 DIAGNOSIS — B9689 Other specified bacterial agents as the cause of diseases classified elsewhere: Secondary | ICD-10-CM

## 2023-10-15 NOTE — Telephone Encounter (Signed)
 Per GH:  "OV, so that we can figure out what is going on and coordinate her care if needed."   LVMTCB.

## 2023-10-15 NOTE — Telephone Encounter (Signed)
 Pt LVM in triage line stating that she tried to contact our office regarding this last week to multiple people. In VM, pt reporting still having the sxs of the BV (dx'd on 09/21/2023).   Spoke w/ the pt &she reported experiencing some relief after finishing the abxs initially but sxs have now returned even though following all recommendations made by provider at the time of visit/results. Reports no IC, staying clean, and going to the restroom q2hrs to void.   Pt c/o sxs of sudden/constant urge to urinate. Reports being at the sink (doing household chores) and experiencing a gush of urine.  Please advise.

## 2023-10-16 MED ORDER — METRONIDAZOLE 500 MG PO TABS: 500.0000 mg | ORAL_TABLET | Freq: Two times a day (BID) | ORAL | 0 refills | Status: DC

## 2023-10-16 NOTE — Addendum Note (Signed)
Addended by: Dione Housekeeper on: 10/16/2023 10:37 AM   Modules accepted: Orders

## 2023-10-16 NOTE — Telephone Encounter (Addendum)
Med refill request: Flagyl 500mg  tablet  Last VHQ:IONG OV 09/21/23  Next AEX: not scheduled  Last MMG (if hormonal med) n/a  Refill authorized: Last rx 09/24/23 #14 0 refills. Rx refused. Per Dr. Kennith Center, patient needs to schedule an appointment to figure out what is going on to coordinate care.

## 2023-10-16 NOTE — Telephone Encounter (Signed)
Rx sent as directed by Dr. Kennith Center.

## 2023-10-18 ENCOUNTER — Other Ambulatory Visit: Payer: Medicaid Other | Admitting: Obstetrics and Gynecology

## 2023-10-18 ENCOUNTER — Other Ambulatory Visit: Payer: Medicaid Other

## 2023-10-19 NOTE — Telephone Encounter (Signed)
Do you mind attempting to reach the pt again to schedule OV per [facility]?  Thanks.

## 2023-10-19 NOTE — Telephone Encounter (Signed)
Per CS:  "Left message for patient to call and schedule appointment."

## 2023-10-29 NOTE — Telephone Encounter (Signed)
 No returned call or appt scheduled to date. Encounter routed to provider for final review and closed.

## 2023-11-28 ENCOUNTER — Emergency Department (HOSPITAL_COMMUNITY)
Admission: EM | Admit: 2023-11-28 | Discharge: 2023-11-29 | Disposition: A | Discharge status: Other Acute Inpatient Hospital | Attending: Emergency Medicine | Admitting: Emergency Medicine

## 2023-11-28 ENCOUNTER — Other Ambulatory Visit: Payer: Self-pay

## 2023-11-28 DIAGNOSIS — F29 Unspecified psychosis not due to a substance or known physiological condition: Secondary | ICD-10-CM | POA: Diagnosis present

## 2023-11-28 DIAGNOSIS — F309 Manic episode, unspecified: Secondary | ICD-10-CM | POA: Diagnosis present

## 2023-11-28 DIAGNOSIS — F259 Schizoaffective disorder, unspecified: Secondary | ICD-10-CM | POA: Insufficient documentation

## 2023-11-28 LAB — ETHANOL: Alcohol, Ethyl (B): <10 mg/dL (ref <10)

## 2023-11-28 LAB — COMPREHENSIVE METABOLIC PANEL
ALT: 15 U/L (ref 0–44)
AST: 17 U/L (ref 15–41)
Albumin: 4.2 g/dL (ref 3.5–5.0)
Alkaline Phosphatase: 43 U/L (ref 38–126)
Anion gap: 10 (ref 5–15)
BUN: 18 mg/dL (ref 6–20)
CO2: 23 mmol/L (ref 22–32)
Calcium: 9.3 mg/dL (ref 8.9–10.3)
Chloride: 104 mmol/L (ref 98–111)
Creatinine, Ser: 0.79 mg/dL (ref 0.44–1.00)
GFR, Estimated: >60 mL/min (ref >60)
Glucose, Bld: 132 mg/dL — ABNORMAL HIGH (ref 70–99)
Potassium: 3.7 mmol/L (ref 3.5–5.1)
Sodium: 137 mmol/L (ref 135–145)
Total Bilirubin: 0.5 mg/dL (ref 0.0–1.2)
Total Protein: 7.5 g/dL (ref 6.5–8.1)

## 2023-11-28 LAB — RAPID URINE DRUG SCREEN, HOSP PERFORMED
Amphetamines: NOT DETECTED
Barbiturates: NOT DETECTED
Benzodiazepines: NOT DETECTED
Cocaine: NOT DETECTED
Opiates: NOT DETECTED
Tetrahydrocannabinol: NOT DETECTED

## 2023-11-28 LAB — HCG, SERUM, QUALITATIVE: Preg, Serum: NEGATIVE

## 2023-11-28 LAB — CBC
HCT: 42.5 % (ref 36.0–46.0)
Hemoglobin: 13.6 g/dL (ref 12.0–15.0)
MCH: 27.5 pg (ref 26.0–34.0)
MCHC: 32 g/dL (ref 30.0–36.0)
MCV: 85.9 fL (ref 80.0–100.0)
Platelets: 326 10*3/uL (ref 150–400)
RBC: 4.95 MIL/uL (ref 3.87–5.11)
RDW: 13.6 % (ref 11.5–15.5)
WBC: 5.7 10*3/uL (ref 4.0–10.5)
nRBC: 0 % (ref 0.0–0.2)

## 2023-11-28 MED ORDER — DIVALPROEX SODIUM ER 250 MG PO TB24
250.0000 mg | ORAL_TABLET | Freq: Every day | ORAL | Status: DC
Start: 2023-11-28 — End: 2023-11-29
  Administered 2023-11-28: 250 mg via ORAL
  Filled 2023-11-28: qty 1

## 2023-11-28 MED ORDER — OLANZAPINE 10 MG IM SOLR
10.0000 mg | Freq: Once | INTRAMUSCULAR | Status: AC
  Administered 2023-11-28: 10 mg via INTRAMUSCULAR
  Filled 2023-11-28: qty 10

## 2023-11-28 MED ORDER — STERILE WATER FOR INJECTION IJ SOLN
INTRAMUSCULAR | Status: AC
  Administered 2023-11-28: 2.1 mL
  Filled 2023-11-28: qty 10

## 2023-11-28 MED ORDER — CLONAZEPAM 0.5 MG PO TABS
0.5000 mg | ORAL_TABLET | Freq: Once | ORAL | Status: AC
  Administered 2023-11-28: 0.5 mg via ORAL
  Filled 2023-11-28: qty 1

## 2023-11-28 MED ORDER — OLANZAPINE 10 MG PO TABS
10.0000 mg | ORAL_TABLET | Freq: Every day | ORAL | Status: DC
  Administered 2023-11-28: 10 mg via ORAL
  Filled 2023-11-28: qty 1

## 2023-11-28 MED ORDER — ESCITALOPRAM OXALATE 10 MG PO TABS
10.0000 mg | ORAL_TABLET | Freq: Two times a day (BID) | ORAL | Status: DC
Start: 2023-11-28 — End: 2023-11-29
  Administered 2023-11-28 – 2023-11-29 (×3): 10 mg via ORAL
  Filled 2023-11-28 (×3): qty 1
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
## 2023-11-28 NOTE — ED Provider Notes (Signed)
 I provided a substantive portion of the care of this patient.  I personally made/approved the management plan for this patient and take responsibility for the patient management.     Patient present with manic pressured speech and grandiose ideology with paranoid features.   Patient is alert and nontoxic.  He is clear.  Movements are coordinated purposeful symmetric.  She is not ill in appearance.  I have personally interviewed the patient and reviewed her diagnostic results.  The results within normal limits.  I have ordered Zyprexa and Klonopin for agitation.  I have reviewed patient's medical record for prior medications.  I have completed patient's IVC paperwork.  Patient is medically cleared for psychiatric evaluation and admission.   Arby Barrette, MD 11/28/23 [phone]

## 2023-11-28 NOTE — ED Provider Notes (Signed)
 [place] EMERGENCY DEPARTMENT AT [facility] Provider Note   CSN: [id number] Arrival date & time: 11/28/23  4098     History  Chief Complaint  Patient presents with   IVC   Psychiatric Evaluation    Stacy Poole is a 33 y.o. female.  Patient with history of schizoaffective affective disorder, bipolar type, alcohol abuse history --brought in by parent today for evaluation of worsening mania.  She has been very paranoid.  Family reports increased stressors with a new drive and with applying to get an apartment.  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  Parent reports that she weakens very aggressive at times.  No recent medical symptoms.  Was treated with 2 rounds of antibiotics for bacterial vaginosis.       Home Medications Prior to Admission medications   Medication Sig Start Date End Date Taking? Authorizing Provider  acetaminophen (TYLENOL) 325 MG tablet Take 650 mg by mouth every 6 (six) hours as needed for mild pain or headache.    [provider]  carbamazepine (TEGRETOL-XR) 200 MG 12 hr tablet Take 1 tablet (200 mg total) by mouth 2 (two) times daily. Patient not taking: Reported on 04/10/2022 01/15/22 01/15/23  Earney Navy, NP  clonazePAM (KLONOPIN) 0.5 MG tablet Take 0.5 mg by mouth 2 (two) times daily as needed for anxiety.    [provider]  cloNIDine (CATAPRES) 0.1 MG tablet Take 1 tablet (0.1 mg total) by mouth 2 (two) times daily. Patient not taking: Reported on 04/13/2022 09/05/21 04/13/22  Roselle Locus, MD  cloNIDine HCl (KAPVAY) 0.1 MG TB12 ER tablet Take 0.1 mg by mouth at bedtime. Patient not taking: Reported on 09/21/2023 03/24/22   [provider]  escitalopram (LEXAPRO) 20 MG tablet SMARTSIG:1 Tablet(s) By Mouth Every Evening 09/04/23   [provider]  hydrOXYzine (ATARAX) 25 MG tablet Take 1 tablet (25 mg total) by mouth 3 (three) times daily as needed for anxiety. Patient taking  differently: Take 25 mg by mouth 3 (three) times daily as needed for anxiety (or agitation). 09/05/21   Roselle Locus, MD  ibuprofen (ADVIL) 200 MG tablet Take 400-600 mg by mouth every 6 (six) hours as needed (pain or headaches).    [provider]  metroNIDAZOLE (FLAGYL) 500 MG tablet Take 1 tablet (500 mg total) by mouth 2 (two) times daily. 10/16/23   Hines, Lennox Solders, MD  OLANZapine (ZYPREXA) 10 MG tablet Take 1 tablet (10 mg total) by mouth at bedtime. 01/15/22 05/01/22  Earney Navy, NP  OLANZapine (ZYPREXA) 20 MG tablet Take 20 mg by mouth at bedtime. Patient not taking: Reported on 09/21/2023    [provider]  Oxcarbazepine (TRILEPTAL) 300 MG tablet Take 300 mg by mouth 2 (two) times daily. Patient not taking: Reported on 09/21/2023 04/24/22   [provider]  traZODone (DESYREL) 50 MG tablet Take 50 mg by mouth at bedtime.    [provider]  Vitamin D3 (VITAMIN D) 25 MCG tablet Take 1 tablet (1,000 Units total) by mouth daily. Patient not taking: Reported on 04/13/2022 09/06/21   Roselle Locus, MD  VRAYLAR 3 MG capsule SMARTSIG:1 Capsule(s) By Mouth Every Evening 09/03/23   [provider]      Allergies    Abilify [aripiprazole], Lamictal [lamotrigine], Carbamazepine, Other, Pork-derived products, and Tetracyclines & related    Review of Systems   Review of Systems  Physical Exam Updated Vital Signs BP (!) 143/97 (BP Location: Right Arm)  Pulse (!) 114   Temp 98.5 F (36.9 C) (Oral)   Resp 18   SpO2 100%  Physical Exam Vitals and nursing note reviewed.  Constitutional:      Appearance: She is well-developed.  HENT:     Head: Normocephalic and atraumatic.  Eyes:     Conjunctiva/sclera: Conjunctivae normal.  Pulmonary:     Effort: No respiratory distress.  Musculoskeletal:     Cervical back: Normal range of motion and neck supple.  Skin:    General: Skin is warm and dry.  Neurological:     Mental Status:  She is alert.  Psychiatric:        Attention and Perception: Attention normal.        Mood and Affect: Affect is labile and inappropriate.        Speech: Speech is rapid and pressured.        Behavior: Behavior is hyperactive.        Thought Content: Thought content is paranoid.     ED Results / Procedures / Treatments   Labs (all labs ordered are listed, but only abnormal results are displayed) Labs Reviewed  COMPREHENSIVE METABOLIC PANEL - Abnormal; Notable for the following components:      Result Value   Glucose, Bld 132 (*)    All other components within normal limits  ETHANOL  CBC  RAPID URINE DRUG SCREEN, HOSP PERFORMED  HCG, SERUM, QUALITATIVE    EKG None  Radiology No results found.  Procedures Procedures    Medications Ordered in ED Medications - No data to display  ED Course/ Medical Decision Making/ A&P    Patient seen and examined. History obtained directly from patient and parent.   Patient also discussed with Dr. Donnald Garre who has seen patient.  She has been placed under involuntary commitment.  Labs/EKG: Ordered medical clearance labs including CBC, CMP, UDS, pregnancy, EtOH.  Imaging: None ordered  Medications/Fluids: None ordered  Most recent vital signs reviewed and are as follows: BP (!) 143/97 (BP Location: Right Arm)   Pulse (!) 114   Temp 98.5 F (36.9 C) (Oral)   Resp 18   SpO2 100%   Initial impression: paranoia, mania  11:57 AM   Labs personally reviewed and interpreted including: CBC unremarkable; CMP glucose 132 otherwise unremarkable; UDS negative; ethanol negative; pregnancy negative.  Most current vital signs reviewed and are as follows: BP (!) 143/97 (BP Location: Right Arm)   Pulse (!) 114   Temp 98.5 F (36.9 C) (Oral)   Resp 18   SpO2 100%   Plan: Patient is medically cleared. TTS evaluation ordered.  2:05 PM Awaiting TTS reccs. Olanzipine and klonopin ordered previously by Dr. Donnald Garre for increased  agitation.                                Medical Decision Making Amount and/or Complexity of Data Reviewed Labs: ordered.  Risk Prescription drug management.   Pt medically cleared -- apparent exacerbation of agitation, psychosis.         Final Clinical Impression(s) / ED Diagnoses Final diagnoses:  [facility])    Rx / DC Orders ED Discharge Orders     None         Renne Crigler, PA-C 11/28/23 1407    Arby Barrette, MD 11/28/23 1732
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
## 2023-11-28 NOTE — ED Notes (Signed)
 Pt dressed out in burgundy scrubs. Urine was obtained. Pt very manic and was taken to room 29. Pt belongings placed in locker 29.

## 2023-11-28 NOTE — Progress Notes (Signed)
 LCSW Progress Note:    Stacy Poole  MRN: [id number]  11/28/2023 6:45 PM  Received a call at 6:41 PM from Intake Coordinator, Daine Gip, at [facility]. The patient has been accepted for admission today, and the bed is now ready. The accepting provider is Lelon Mast, NP, and her attending is Dr. Durwin Nora. The room number is 1016. Nurse report can be reached at [phone]. Patient's care team Carleene Overlie, RN and Phebe Colla, NP) provided disposition updates.

## 2023-11-28 NOTE — Progress Notes (Signed)
 LCSW Progress Note  [id number]   Stacy Poole  11/28/2023  4:03 PM  Description:   Inpatient Psychiatric Referral  Patient was recommended inpatient per Phebe Colla NP. There are no available beds at [facility], per [facility] Victory Dakin RN). Patient was referred to the following out of network facilities:   Destination  Service Provider Address Phone Fax  [facility], [place] [phone]  [facility] [address] [place] [address] [phone]  [facility] [address] [place], [place] [address] [phone]  [facility] [address] [place] [address] [phone]  [facility] [address], [place] [address] [phone]  [facility] [address], [place] [address] [phone]  [facility] [address], [place] [address] [phone]  [facility] EFAX [address] [place], [place] [phone]  [facility] [address] [place] [address] [phone]  [facility] [address], [place] [address] [phone]      Situation ongoing, CSW to continue following and update chart as more information becomes available.     [place] Jessee Newnam MSW, LCSW  11/28/2023 4:03 PM

## 2023-11-28 NOTE — ED Notes (Signed)
 Patient becoming increasingly agitated    meds requested from EDP

## 2023-11-28 NOTE — ED Notes (Signed)
 Mom just visited and expressed concern in her daughter being transferred out   she was under the impression that she would be treated here and released from here tomorrow  I just received confirmation that she has been excepted at a facility   I have sent a message to the case manager about these findings
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
## 2023-11-28 NOTE — ED Notes (Addendum)
 Stacy Poole has been accepted at [facility] hospital on 11/30/2023 has a ready bed. Pt must be able to be managed with po medications only until that time for her to be accepted to that hospital.

## 2023-11-29 LAB — SARS CORONAVIRUS 2 BY RT PCR: SARS Coronavirus 2 by RT PCR: NEGATIVE

## 2023-11-29 MED ORDER — LORAZEPAM 1 MG PO TABS
2.0000 mg | ORAL_TABLET | Freq: Once | ORAL | Status: AC
  Administered 2023-11-29: 2 mg via ORAL
  Filled 2023-11-29: qty 2

## 2023-11-29 NOTE — ED Provider Notes (Signed)
  Physical Exam  BP 124/74 (BP Location: Right Arm)   Pulse 93   Temp 98.1 F (36.7 C) (Oral)   Resp 18   SpO2 99%   Physical Exam  Procedures  Procedures  ED Course / MDM    Medical Decision Making Amount and/or Complexity of Data Reviewed Labs: ordered.  Risk Prescription drug management.   Accepted at [facility].  Dr. Hinda Lenis.  Needs no IM medications.  Reportedly will go soon.        Benjiman Core, MD 11/29/23 [phone]

## 2023-11-29 NOTE — ED Notes (Signed)
 Report given to [facility].

## 2023-11-29 NOTE — ED Notes (Signed)
Gave pt ice water °

## 2023-11-29 NOTE — ED Notes (Signed)
 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 Medication for restless and anxiety was requested  waiting EDP reply.

## 2024-04-21 ENCOUNTER — Other Ambulatory Visit: Payer: Self-pay

## 2024-04-21 ENCOUNTER — Emergency Department (HOSPITAL_COMMUNITY)
Admission: EM | Admit: 2024-04-21 | Discharge: 2024-04-23 | Disposition: A | Discharge status: Other Acute Inpatient Hospital | Attending: Emergency Medicine | Admitting: Emergency Medicine

## 2024-04-21 DIAGNOSIS — T43211A Poisoning by selective serotonin and norepinephrine reuptake inhibitors, accidental (unintentional), initial encounter: Secondary | ICD-10-CM | POA: Diagnosis present

## 2024-04-21 DIAGNOSIS — D72829 Elevated white blood cell count, unspecified: Secondary | ICD-10-CM | POA: Diagnosis not present

## 2024-04-21 DIAGNOSIS — T50904A Poisoning by unspecified drugs, medicaments and biological substances, undetermined, initial encounter: Secondary | ICD-10-CM

## 2024-04-21 DIAGNOSIS — F309 Manic episode, unspecified: Secondary | ICD-10-CM | POA: Diagnosis not present

## 2024-04-21 LAB — RAPID URINE DRUG SCREEN, HOSP PERFORMED
Amphetamines: NOT DETECTED
Barbiturates: NOT DETECTED
Benzodiazepines: NOT DETECTED
Cocaine: NOT DETECTED
Opiates: NOT DETECTED
Tetrahydrocannabinol: POSITIVE — AB

## 2024-04-21 LAB — CBC
HCT: 42.8 % (ref 36.0–46.0)
Hemoglobin: 14 g/dL (ref 12.0–15.0)
MCH: 27.8 pg (ref 26.0–34.0)
MCHC: 32.7 g/dL (ref 30.0–36.0)
MCV: 84.9 fL (ref 80.0–100.0)
Platelets: 354 K/uL (ref 150–400)
RBC: 5.04 MIL/uL (ref 3.87–5.11)
RDW: 13.7 % (ref 11.5–15.5)
WBC: 10.9 K/uL — ABNORMAL HIGH (ref 4.0–10.5)
nRBC: 0 % (ref 0.0–0.2)

## 2024-04-21 LAB — COMPREHENSIVE METABOLIC PANEL WITH GFR
ALT: 26 U/L (ref 0–44)
AST: 33 U/L (ref 15–41)
Albumin: 4.3 g/dL (ref 3.5–5.0)
Alkaline Phosphatase: 54 U/L (ref 38–126)
Anion gap: 15 (ref 5–15)
BUN: 16 mg/dL (ref 6–20)
CO2: 20 mmol/L — ABNORMAL LOW (ref 22–32)
Calcium: 8.8 mg/dL — ABNORMAL LOW (ref 8.9–10.3)
Chloride: 101 mmol/L (ref 98–111)
Creatinine, Ser: 0.9 mg/dL (ref 0.44–1.00)
GFR, Estimated: >60 mL/min (ref >60)
Glucose, Bld: 90 mg/dL (ref 70–99)
Potassium: 3.8 mmol/L (ref 3.5–5.1)
Sodium: 136 mmol/L (ref 135–145)
Total Bilirubin: 0.7 mg/dL (ref 0.0–1.2)
Total Protein: 7.2 g/dL (ref 6.5–8.1)

## 2024-04-21 LAB — HCG, SERUM, QUALITATIVE: Preg, Serum: NEGATIVE

## 2024-04-21 LAB — ACETAMINOPHEN LEVEL: Acetaminophen (Tylenol), Serum: <10 ug/mL — ABNORMAL LOW (ref 10–30)

## 2024-04-21 LAB — MAGNESIUM: Magnesium: 1.9 mg/dL (ref 1.7–2.4)

## 2024-04-21 LAB — ETHANOL: Alcohol, Ethyl (B): <15 mg/dL (ref <15)

## 2024-04-21 LAB — SALICYLATE LEVEL: Salicylate Lvl: <7 mg/dL — ABNORMAL LOW (ref 7.0–30.0)

## 2024-04-21 MED ORDER — DROPERIDOL 2.5 MG/ML IJ SOLN
5.0000 mg | Freq: Once | INTRAMUSCULAR | Status: AC
  Administered 2024-04-21: 5 mg via INTRAVENOUS
  Filled 2024-04-21: qty 2

## 2024-04-21 MED ORDER — DIAZEPAM 5 MG/ML IJ SOLN
5.0000 mg | Freq: Once | INTRAMUSCULAR | Status: AC
  Administered 2024-04-21: 5 mg via INTRAVENOUS
  Filled 2024-04-21: qty 2

## 2024-04-21 MED ORDER — DIAZEPAM 5 MG/ML IJ SOLN
10.0000 mg | Freq: Once | INTRAMUSCULAR | Status: AC
  Administered 2024-04-21: 10 mg via INTRAVENOUS
  Filled 2024-04-21: qty 2
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
## 2024-04-21 NOTE — ED Notes (Signed)
 Pt throwing food on the floor and yelling at staff
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
## 2024-04-21 NOTE — ED Notes (Addendum)
 Pt was dressed out, blood and urine collected. Pt belongings placed in bag and labeled. (Clothes and 1 necklace) Pt is sitting in [place] with [employer] officer present.

## 2024-04-21 NOTE — ED Provider Notes (Signed)
 Accepted handoff at shift change from [facility]. Please see prior provider note for more detail.   Briefly: Patient is 33 y.o. medical history significant for bipolar disorder and schizophrenia presents today for taking approximately 30 tabs of 20 mg Lexapro .  Poison control stated that the patient should be monitored for 12 hours.  Patient had QT prolongation on her EKG.  Patient is currently IVC.  DDX: concern for HI, SI, AVH  Plan: Observation and TTS consult Physical Exam  BP (!) 133/90   Pulse 78   Temp 97.7 F (36.5 C) (Oral)   Resp 17   Ht 5' 4 (1.626 m)   Wt 65 kg   LMP 04/07/2024   SpO2 100%   BMI 24.60 kg/m   Physical Exam Vitals and nursing note reviewed.  Constitutional:      General: She is not in acute distress.    Appearance: She is well-developed. She is not toxic-appearing.  HENT:     Head: Normocephalic and atraumatic.  Eyes:     Extraocular Movements: Extraocular movements intact.  Cardiovascular:     Rate and Rhythm: Normal rate and regular rhythm.  Pulmonary:     Effort: Pulmonary effort is normal. No respiratory distress.  Abdominal:     Palpations: Abdomen is soft.  Musculoskeletal:        General: No swelling.     Cervical back: Neck supple.  Skin:    General: Skin is warm and dry.     Capillary Refill: Capillary refill takes less than 2 seconds.  Neurological:     Mental Status: She is alert.  Psychiatric:        Mood and Affect: Mood normal.     Procedures  Procedures  ED Course / MDM   Clinical Course as of 04/22/24 0455  Mon Apr 21, 2024  1708 Spoke to poison control, who recommended 24 hour cardiac monitor, recommending at least 12 hour cardiac monitor, looking for QT prolongation. Also recommended benzos for agitation, and also getting tylenol  level and salicylate level.  [CB]    Clinical Course User Index [CB] Beola Terrall RAMAN, PA-C   Medical Decision Making Amount and/or Complexity of Data Reviewed Labs:  ordered.  Risk Prescription drug management.   Patient remained stable after 12-hour observation.  TTS consult placed which will determine patient disposition.       Francis Ileana SAILOR, PA-C 04/22/24 0455    Mannie Pac T, DO 04/27/24 1530

## 2024-04-21 NOTE — ED Triage Notes (Signed)
 Patient to ED by [employer] voluntarily for SI states she wants to swallowing a bunch of pills. Patient speech is clear but thoughts are everywhere.

## 2024-04-21 NOTE — ED Notes (Signed)
 Have been unable to obtain EKG at this time patient is very rowdy at the moment.

## 2024-04-21 NOTE — ED Notes (Signed)
 Pt has been screaming in the hallway and demanding a bed. Pt verbalizing racial comments towards Caucasian staff.

## 2024-04-22 MED ORDER — HYDROXYZINE HCL 25 MG PO TABS
25.0000 mg | ORAL_TABLET | Freq: Three times a day (TID) | ORAL | Status: DC | PRN
  Administered 2024-04-22 – 2024-04-23 (×3): 25 mg via ORAL
  Filled 2024-04-22 (×3): qty 1

## 2024-04-22 MED ORDER — ZIPRASIDONE MESYLATE 20 MG IM SOLR: 20.0000 mg | Freq: Once | INTRAMUSCULAR | Status: DC

## 2024-04-22 MED ORDER — CARIPRAZINE HCL 1.5 MG PO CAPS
3.0000 mg | ORAL_CAPSULE | Freq: Every day | ORAL | Status: DC
  Administered 2024-04-22: 3 mg via ORAL
  Filled 2024-04-22: qty 2

## 2024-04-22 MED ORDER — ZIPRASIDONE MESYLATE 20 MG IM SOLR
20.0000 mg | Freq: Once | INTRAMUSCULAR | Status: AC
  Administered 2024-04-22: 20 mg via INTRAMUSCULAR

## 2024-04-22 MED ORDER — ZIPRASIDONE MESYLATE 20 MG IM SOLR
INTRAMUSCULAR | Status: AC
  Administered 2024-04-22: 20 mg via INTRAMUSCULAR
  Filled 2024-04-22: qty 20

## 2024-04-22 MED ORDER — MIRTAZAPINE 7.5 MG PO TABS
7.5000 mg | ORAL_TABLET | Freq: Every evening | ORAL | Status: DC | PRN
  Administered 2024-04-22: 7.5 mg via ORAL
  Filled 2024-04-22: qty 1

## 2024-04-22 MED ORDER — STERILE WATER FOR INJECTION IJ SOLN
INTRAMUSCULAR | Status: AC
Start: 2024-04-22 — End: 2024-04-23
  Filled 2024-04-22: qty 10

## 2024-04-22 MED ORDER — CARIPRAZINE HCL 1.5 MG PO CAPS
3.0000 mg | ORAL_CAPSULE | ORAL | Status: AC
  Administered 2024-04-22: 3 mg via ORAL
  Filled 2024-04-22: qty 2

## 2024-04-22 NOTE — ED Notes (Signed)
 Denise from Poison control called to follow up on how patient was doing.

## 2024-04-22 NOTE — ED Notes (Signed)
 Patient alert. Patient having manic behaviors. Patient disorganized, labile, tangential.  Patient crying at times, angry at times.  Cooperative with redirection.  No suicidal or homicidal ideation noted. Patient stated saw a lizard crawling up the wall. Anxious at times.

## 2024-04-22 NOTE — ED Notes (Signed)
 Pt escorted to room with security and belongings placed in designated locker.

## 2024-04-22 NOTE — ED Notes (Signed)
 Tray delivered
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
## 2024-04-22 NOTE — BH Assessment (Signed)
 Comprehensive Clinical Assessment (CCA) Note   04/22/2024 Stacy Poole [id number]  Disposition: Stacy Ivans, NP recommends inpatient hospitalization.   The patient demonstrates the following risk factors for suicide: Chronic risk factors for suicide include: psychiatric disorder of Schizoaffective. Acute risk factors for suicide include: recent discharge from inpatient psychiatry. Protective factors for this patient include: positive social support. Considering these factors, the overall suicide risk at this point appears to be low. Patient is not appropriate for outpatient follow up.   Per Stacy Poole note:  Pt is a 33 year old female present to the ED today for possible concerns for drug ingestion of unknown medication. Patient is obviously manic, unable to answer questions and screaming at staff without provocation. Known to have Hx of manic episodes, bipolar, SI, drug/alcohol  abuse, schizoaffective disorder.   Talked to mother who said she last saw her 1 week ago and was fine, but said that she had seemed to be escalating referring to her being more anxious and paranoid. Believes that she has not been sleeping. Believes this to be another manic episode. Patient told her that she had explosive diarrhea 4 days ago until yesterday which resolved, and was feeling better. Stacy Poole that she had an incident yesterday morning in which police were called, uncertain as to the specifics, but believes a bad influence friend was involved. Told to have been throwing up since 02:30 this morning. Mother believes that she may have been drinking as the patient was reported to be a compulsive binge drinker. Reported an empty pill bottle of lexapro  this morning. Estimated to be approximately 23 pills of 20mg  tablets, with prescription refilled last week. She may have had other bottle of lexapro  from previous prescription and uncertain if it was this bottle that was empty or the new bottle.      Valvar 3 mg every  day, doxazosin  1 mg every day, topiramate  50mg  every day, lexapro  20mg  every day, mirtazapine  7.5mg  every day PRN.     Upon evaluation with this clinician, the patient is alert, oriented x 3, and cooperative. Speech is pressured. Pt presents manic. Eye contact is fair. Mood is anxious; affect is congruent with mood. The thought process is logical and thought content is coherent. Pt was seen due to overdose on medication but pt denies overdose has a SI attempt. Pt denies SI/HI/AVH. There is no indication that the patient is responding to internal stimuli. No delusions elicited during this assessment.     Chief Complaint:  Chief Complaint  Patient presents with   Psychiatric Evaluation   Visit Diagnosis: Schizoaffective     CCA Screening, Triage and Referral (STR)  Patient Reported Information How did you hear about us ? Other (Comment) ([facility] ED)  What Is the Reason for Your Visit/Call Today? Per Stacy Poole note:  Pt is a 33 year old female present to the ED today for possible concerns for drug ingestion of unknown medication. Patient is obviously manic, unable to answer questions and screaming at staff without provocation. Known to have Hx of manic episodes, bipolar, SI, drug/alcohol  abuse, schizoaffective disorder.   Talked to mother who said she last saw her 1 week ago and was fine, but said that she had seemed to be escalating referring to her being more anxious and paranoid. Believes that she has not been sleeping. Believes this to be another manic episode. Patient told her that she had explosive diarrhea 4 days ago until yesterday which resolved, and was feeling better. Stacy Poole that she had an incident yesterday  morning in which police were called, uncertain as to the specifics, but believes a bad influence friend was involved. Told to have been throwing up since 02:30 this morning. Mother believes that she may have been drinking as the patient was reported to be a compulsive binge drinker.  Reported an empty pill bottle of lexapro  this morning. Estimated to be approximately 23 pills of 20mg  tablets, with prescription refilled last week. She may have had other bottle of lexapro  from previous prescription and uncertain if it was this bottle that was empty or the new bottle.      Valvar 3 mg every day, doxazosin  1 mg every day, topiramate  50mg  every day, lexapro  20mg  every day, mirtazapine  7.5mg  every day PRN.    How Long Has This Been Causing You Problems? > than 6 months  What Do You Feel Would Help You the Most Today? Treatment for Depression or other mood problem; Stress Management; Medication(s)   Have You Recently Had Any Thoughts About Hurting Yourself? No  Are You Planning to Commit Suicide/Harm Yourself At This time? No   Flowsheet Row ED from 11/28/2023 in [facility] Emergency Department at [facility] ED from 09/12/2023 in [facility] Emergency Department at [facility] from 05/01/2022 in [facility] Emergency Department at [facility]  C-SSRS RISK CATEGORY No Risk No Risk High Risk    Have you Recently Had Thoughts About Hurting Someone [name]? No  Are You Planning to Harm Someone at This Time? No  Explanation: Denies HI   Have You Used Any Alcohol  or Drugs in the Past 24 Hours? Yes  How Long Ago Did You Use Drugs or Alcohol ?Yesterday What Did You Use and How Much? Pt reports releapsing on ETOH. Pt reports drinking a 6 pack of beer   Do You Currently Have a Therapist/Psychiatrist? No  Name of Therapist/Psychiatrist:    Have You Been Recently Discharged From Any Office Practice or Programs? No  Explanation of Discharge From Practice/Program: n/a    CCA Screening Triage Referral Assessment Type of Contact: Tele-Assessment  Telemedicine Service Delivery:   Is this Initial or Reassessment? Is this Initial or Reassessment?: Initial Assessment  Date Telepsych consult ordered in [facility]:  Date Telepsych consult ordered in [facility]:  04/22/24  Time Telepsych consult ordered in [facility]:  Time Telepsych consult ordered in [name]: 0456  Location of Assessment: [facility]  Provider Location: [facility] Assessment Services   Collateral Involvement: None   Does Patient Have a Automotive engineer Guardian? No  Legal Guardian Contact Information: n/a  Copy of Legal Guardianship Form: -- (n/a)  Legal Guardian Notified of Arrival: -- (n/a)  Legal Guardian Notified of Pending Discharge: -- (n/a)  If Minor and Not Living with Parent(s), Who has Custody? n/a  Is CPS involved or ever been involved? Never  Is APS involved or ever been involved? Never   Patient Determined To Be At Risk for Harm To Self or Others Based on Review of Patient Reported Information or Presenting Complaint? Yes, for Self-Harm (Pt reports that she accidently took overdose denies SI)  Method: Plan with intent and identified person  Availability of Means: In hand or used  Intent: Intends to cause physical harm but not necessarily death  Notification Required: No need or identified person  Additional Information for Danger to Others Potential: -- (n/a)  Additional Comments for Danger to Others Potential: Pt denies HI  Are There Guns or Other Weapons in Your Home? No  Types of  Guns/Weapons: Denies access  Are These Weapons Safely Secured?                            No  Who Could Verify You Are Able To Have These Secured: Denies access  Do You Have any Outstanding Charges, Pending Court Dates, Parole/Probation? Denies pending legal charges  Contacted To Inform of Risk of Harm To Self or Others: -- (n/a)    Does Patient Present under Involuntary Commitment? No    [place] of Residence: [place]   Patient Currently Receiving the Following Services: Medication Management   Determination of Need: Urgent (48 hours)   Options For Referral: Inpatient Hospitalization     CCA Biopsychosocial Patient Reported Schizophrenia/Schizoaffective  Diagnosis in Past: Yes (schizoeffective)   Strengths: Pt is motivated for treatment.   Mental Health Symptoms Depression:  Change in energy/activity; Difficulty Concentrating; Fatigue; Sleep (too much or little); Irritability; Tearfulness   Duration of Depressive symptoms: Duration of Depressive Symptoms: Greater than two weeks   Mania:  Change in energy/activity; Overconfidence; Racing thoughts; Recklessness; Irritability   Anxiety:   Worrying; Tension; Sleep; Difficulty concentrating; Fatigue; Irritability; Restlessness   Psychosis:  None   Duration of Psychotic symptoms:    Trauma:  None   Obsessions:  None   Compulsions:  None   Inattention:  None   Hyperactivity/Impulsivity:  N/A   Oppositional/Defiant Behaviors:  Argumentative; Temper (Per ED report)   Emotional Irregularity:  Mood lability; Potentially harmful impulsivity; Recurrent suicidal behaviors/gestures/threats   Other Mood/Personality Symptoms:  None noted    Mental Status Exam Appearance and self-care  Stature:  Average   Weight:  Average weight   Clothing:  Age-appropriate   Grooming:  Normal   Cosmetic use:  Age appropriate   Posture/gait:  Normal   Motor activity:  Restless   Sensorium  Attention:  Normal   Concentration:  Anxiety interferes   Orientation:  X5   Recall/memory:  Normal   Affect and Mood  Affect:  Anxious   Mood:  Anxious; Other (Comment) (Fearful)   Relating  Eye contact:  Normal   Facial expression:  Anxious   Attitude toward examiner:  Cooperative   Thought and Language  Speech flow: Normal   Thought content:  Delusions   Preoccupation:  None   Hallucinations:  None   Organization:  Coherent   [employer] of Knowledge:  Average   Intelligence:  Average   Abstraction:  Functional   Judgement:  Poor   Reality Testing:  Variable   Insight:  Gaps   Decision Making:  Impulsive   Social Functioning  Social Maturity:   Isolates; Impulsive   Social Judgement:  Naive   Stress  Stressors:  Grief/losses; Financial; Work   Coping Ability:  Human resources officer Deficits:  None   Supports:  Friends/Service system; Family     Religion: Religion/Spirituality Are You A Religious Person?: No How Might This Affect Treatment?: n/a  Leisure/Recreation: Leisure / Recreation Do You Have Hobbies?: No  Exercise/Diet: Exercise/Diet Do You Exercise?: No Have You Gained or Lost A Significant Amount of Weight in the Past Six Months?: No Do You Follow a Special Diet?: No Do You Have Any Trouble Sleeping?: No   CCA Employment/Education Employment/Work Situation: Employment / Work Situation Employment Situation: Employed Work Stressors: Pt reports working 70 hours a week Patient's Job has Been Impacted by Current Illness: No Has Patient ever Been in Equities trader?: No  Education: Education Is Patient Currently Attending School?: No Last Grade Completed: 16 Did You Attend College?: No Did You Have An Individualized Education Program (IIEP): No Did You Have Any Difficulty At School?: No Patient's Education Has Been Impacted by Current Illness: No   CCA Family/Childhood History Family and Relationship History: Family history Marital status: Single Does patient have children?: No  Childhood History:  Childhood History By whom was/is the patient raised?: Mother Did patient suffer any verbal/emotional/physical/sexual abuse as a child?: No Did patient suffer from severe childhood neglect?: No Has patient ever been sexually abused/assaulted/raped as an adolescent or adult?: No Was the patient ever a victim of a crime or a disaster?: No Witnessed domestic violence?: No Has patient been affected by domestic violence as an adult?: No       CCA Substance Use Alcohol /Drug Use: Alcohol  / Drug Use Pain Medications: See MAR Prescriptions: See MAR Over the Counter: See MAR History of alcohol  / drug  use?: Yes Longest period of sobriety (when/how long): Per pt 7 months; pt reports she has only drank a 6 pack yesterday in 7 months 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 Withdrawal Symptoms: None                         ASAM's:  Six Dimensions of Multidimensional Assessment  Dimension 1:  Acute Intoxication and/or Withdrawal Potential:   Dimension 1:  Description of individual's past and current experiences of substance use and withdrawal: Pt endorsed prior daily use of alcohol  -- unknown quantity (''a lot'')  Dimension 2:  Biomedical Conditions and Complications:   Dimension 2:  Description of patient's biomedical conditions and  complications: Hx of concussions per report  Dimension 3:  Emotional, Behavioral, or Cognitive Conditions and Complications:  Dimension 3:  Description of emotional, behavioral, or cognitive conditions and complications: Schizoaffective disorder; delusions and hallucinations made worse when intoxicated; client reports using alcohol  to manage mood  Dimension 4:  Readiness to Change:  Dimension 4:  Description of Readiness to Change criteria: Acknowledges alcohol  causing more problems  Dimension 5:  Relapse, Continued use, or Continued Problem Potential:  Dimension 5:  Relapse, continued use, or continued problem potential critiera description: Frequent use and relapse  Dimension 6:  Recovery/Living Environment:  Dimension 6:  Recovery/Iiving environment criteria description: Pt has her own townhome  ASAM Severity Score: ASAM's Severity Rating Score: 12  ASAM Recommended Level of Treatment: ASAM Recommended Level of Treatment: Level II Intensive Outpatient Treatment   Substance use Disorder (SUD) Substance Use Disorder (SUD)  Checklist Symptoms of Substance Use: Continued use despite having a persistent/recurrent physical/psychological problem caused/exacerbated by use, Evidence of tolerance, Large amounts of time spent to  obtain, use or recover from the substance(s), Persistent desire or unsuccessful efforts to cut down or control use, Presence of craving or strong urge to use, Recurrent use that results in a failure to fulfill major role obligations (work, school, home), Social, occupational, recreational activities given up or reduced due to use, Substance(s) often taken in larger amounts or over longer times than was intended  Recommendations for Services/Supports/Treatments: Recommendations for Services/Supports/Treatments Recommendations For Services/Supports/Treatments: Medication Management, Individual Therapy, Peer Support, SAIOP (Substance Abuse Intensive Outpatient Program)  Disposition Recommendation per psychiatric provider: We recommend inpatient psychiatric hospitalization when medically cleared. Patient is under voluntary admission status at this time; please IVC if attempts to leave hospital.   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/2019   Alcohol  abuse    Psychosis (HCC)    Schizoaffective disorder, bipolar type (HCC) 05/06/2018   Tobacco use disorder 05/06/2018   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

## 2024-04-22 NOTE — ED Notes (Signed)
 Pt made 1/3 calls

## 2024-04-22 NOTE — ED Notes (Signed)
 Patient has thrown salad everywhere, squirting ranch dressing all over the floor. Patient has water  all over the floor.  Its not my job to clean it up, it's yours. Yelling at staff members and threatening to sue us .

## 2024-04-22 NOTE — ED Notes (Signed)
 Patient ambulated to the bathroom.

## 2024-04-22 NOTE — ED Notes (Signed)
 [facility] called pts mother to inform her that pt has been accepted to [facility] and will be transferred around 8am tomorrow morning. Pt mother said that she will coming by this evening to the [facility] ED to drop off clothing items for pt to take to [facility] for when she is discharged. Pts mother was appreciative of the call.   Chesley Holt, [facility]  04/22/24

## 2024-04-22 NOTE — ED Notes (Signed)
 Patient awake at this time and sitting on top of bed. Sitter at bedside

## 2024-04-22 NOTE — ED Provider Notes (Signed)
 Emergency Medicine Observation Re-evaluation Note  Stacy Poole is a 33 y.o. female, seen on rounds today.  Pt initially presented to the ED for complaints of Psychiatric Evaluation Currently, the patient is resting.  Physical Exam  BP (!) 130/93 (BP Location: Left Arm)   Pulse 73   Temp 98 F (36.7 C) (Oral)   Resp 18   Ht 5' 4 (1.626 m)   Wt 65 kg   LMP 04/07/2024   SpO2 99%   BMI 24.60 kg/m  Physical Exam General: NAD   ED Course / MDM  EKG:   I have reviewed the labs performed to date as well as medications administered while in observation.     Plan  Current plan is for psych evaluation - recommending inpatient placement.    Laurice Maude BROCKS, MD 04/22/24 [phone]

## 2024-04-22 NOTE — ED Notes (Signed)
 Pt mother called and requested that pt not be placed at old [facility] facility. Mother stated that she prefers [facility].

## 2024-04-22 NOTE — ED Notes (Signed)
 Pt demanding to use the phone to call her boss or she is going to sue the hospital. Pt trying to speak to anyone who will listen. She has been wanded to go to Room 34. Pt remains vol and walked back to [employer] with security.

## 2024-04-22 NOTE — Progress Notes (Signed)
 Pt has been accepted to [facility] TOMORROW 04/23/2024  Bed assignment: Main campus  Pt meets inpatient criteria per: [name] NP  Attending Physician will be Millie Manners, MD  Report can be called to: [phone] (this is a pager, please leave call-back number when giving report)  Pt can arrive after 8 AM  Care Team Notified: Landry Bull RN, Cathaleen Jacobson NP, Chesley Holt [facility]   [place] Serenity Batley LCSW-A   04/22/2024 12:13 PM

## 2024-04-23 MED ORDER — DOXAZOSIN MESYLATE 1 MG PO TABS: 1.0000 mg | ORAL_TABLET | Freq: Every day | ORAL | Status: DC

## 2024-04-23 MED ORDER — TOPIRAMATE 25 MG PO TABS
50.0000 mg | ORAL_TABLET | Freq: Two times a day (BID) | ORAL | Status: DC
  Administered 2024-04-23: 50 mg via ORAL
  Filled 2024-04-23: qty 2

## 2024-04-23 MED ORDER — LORAZEPAM 1 MG PO TABS
1.0000 mg | ORAL_TABLET | Freq: Once | ORAL | Status: AC
  Administered 2024-04-23: 1 mg via ORAL
  Filled 2024-04-23: qty 1

## 2024-04-23 NOTE — ED Notes (Signed)
 Pt mother came to front desk and dropped off a bag of clothing to be transported with pt to [facility]. Bag placed in pt locker.

## 2024-04-23 NOTE — ED Provider Notes (Signed)
 Emergency Medicine Observation Re-evaluation Note  Stacy Poole is a 33 y.o. female, seen on rounds today.  Pt initially presented to the ED for complaints of Psychiatric Evaluation Currently, the patient is awake, requesting breakfast.  Subsequently, nursing staff indicated that patient wants Topamax .  She is getting emotionally little bit more labile.  Additionally, nursing staff indicated that patient's systolic blood pressure needs to be less than 100 per psychiatry facility.  I will start her on Cardura , which appears that she was supposed to be on.  But we really cannot manage diastolic blood pressure, as the root causes stiffening of the heart.  Physical Exam  BP (!) 132/107 (BP Location: Right Arm)   Pulse 87   Temp 98.4 F (36.9 C) (Oral)   Resp 17   Ht 5' 4 (1.626 m)   Wt 65 kg   LMP 04/07/2024   SpO2 100%   BMI 24.60 kg/m  Physical Exam General: no acute distress   ED Course / MDM  EKG:EKG Interpretation Date/Time:  Monday April 21 2024 18:47:41 EDT Ventricular Rate:  74 PR Interval:  150 QRS Duration:  78 QT Interval:  400 QTC Calculation: 444 R Axis:   -12  Text Interpretation: Sinus rhythm with marked sinus arrhythmia Otherwise normal ECG When compared with ECG of 28-Nov-2023 13:20, PREVIOUS ECG IS PRESENT Confirmed by Cottie Cough [phone]) on 04/22/2024 4:35:50 PM  I have reviewed the labs performed to date as well as medications administered while in observation.  Recent changes in the last 24 hours include - no new changes.  Plan  Current plan is for holding patient for psychiatric stabilization.    Charlyn Sora, MD 04/23/24 [phone]

## 2024-05-13 ENCOUNTER — Other Ambulatory Visit: Payer: Self-pay

## 2024-05-13 ENCOUNTER — Encounter (HOSPITAL_COMMUNITY): Payer: Self-pay | Admitting: Psychiatry

## 2024-05-13 ENCOUNTER — Encounter (HOSPITAL_COMMUNITY): Payer: Self-pay

## 2024-05-13 ENCOUNTER — Inpatient Hospital Stay (HOSPITAL_COMMUNITY)
Admission: AD | Admit: 2024-05-13 | Discharge: 2024-05-26 | DRG: 885 | Disposition: A | Discharge status: Home / Self Care | Source: Intra-hospital

## 2024-05-13 ENCOUNTER — Emergency Department (EMERGENCY_DEPARTMENT_HOSPITAL)
Admission: EM | Admit: 2024-05-13 | Discharge: 2024-05-13 | Disposition: A | Discharge status: Other Acute Inpatient Hospital | Source: Home / Self Care

## 2024-05-13 DIAGNOSIS — Z9151 Personal history of suicidal behavior: Secondary | ICD-10-CM | POA: Diagnosis not present

## 2024-05-13 DIAGNOSIS — F29 Unspecified psychosis not due to a substance or known physiological condition: Secondary | ICD-10-CM | POA: Insufficient documentation

## 2024-05-13 DIAGNOSIS — F419 Anxiety disorder, unspecified: Secondary | ICD-10-CM | POA: Diagnosis present

## 2024-05-13 DIAGNOSIS — K219 Gastro-esophageal reflux disease without esophagitis: Secondary | ICD-10-CM | POA: Diagnosis present

## 2024-05-13 DIAGNOSIS — Z91014 Allergy to mammalian meats: Secondary | ICD-10-CM | POA: Diagnosis not present

## 2024-05-13 DIAGNOSIS — F1011 Alcohol abuse, in remission: Secondary | ICD-10-CM | POA: Diagnosis present

## 2024-05-13 DIAGNOSIS — Z79899 Other long term (current) drug therapy: Secondary | ICD-10-CM

## 2024-05-13 DIAGNOSIS — F411 Generalized anxiety disorder: Secondary | ICD-10-CM

## 2024-05-13 DIAGNOSIS — F22 Delusional disorders: Secondary | ICD-10-CM | POA: Insufficient documentation

## 2024-05-13 DIAGNOSIS — Z87891 Personal history of nicotine dependence: Secondary | ICD-10-CM | POA: Diagnosis not present

## 2024-05-13 DIAGNOSIS — F312 Bipolar disorder, current episode manic severe with psychotic features: Secondary | ICD-10-CM | POA: Diagnosis present

## 2024-05-13 DIAGNOSIS — F319 Bipolar disorder, unspecified: Secondary | ICD-10-CM | POA: Insufficient documentation

## 2024-05-13 DIAGNOSIS — Z881 Allergy status to other antibiotic agents status: Secondary | ICD-10-CM | POA: Diagnosis not present

## 2024-05-13 DIAGNOSIS — Z5986 Financial insecurity: Secondary | ICD-10-CM | POA: Diagnosis not present

## 2024-05-13 DIAGNOSIS — F1210 Cannabis abuse, uncomplicated: Secondary | ICD-10-CM | POA: Diagnosis present

## 2024-05-13 DIAGNOSIS — F4310 Post-traumatic stress disorder, unspecified: Secondary | ICD-10-CM | POA: Diagnosis not present

## 2024-05-13 DIAGNOSIS — F3174 Bipolar disorder, in full remission, most recent episode manic: Principal | ICD-10-CM | POA: Diagnosis present

## 2024-05-13 DIAGNOSIS — Z888 Allergy status to other drugs, medicaments and biological substances status: Secondary | ICD-10-CM | POA: Diagnosis not present

## 2024-05-13 LAB — COMPREHENSIVE METABOLIC PANEL WITH GFR
ALT: 89 U/L — ABNORMAL HIGH (ref 0–44)
AST: 31 U/L (ref 15–41)
Albumin: 4.7 g/dL (ref 3.5–5.0)
Alkaline Phosphatase: 84 U/L (ref 38–126)
Anion gap: 15 (ref 5–15)
BUN: 10 mg/dL (ref 6–20)
CO2: 19 mmol/L — ABNORMAL LOW (ref 22–32)
Calcium: 9.6 mg/dL (ref 8.9–10.3)
Chloride: 105 mmol/L (ref 98–111)
Creatinine, Ser: 0.89 mg/dL (ref 0.44–1.00)
GFR, Estimated: >60 mL/min (ref >60)
Glucose, Bld: 121 mg/dL — ABNORMAL HIGH (ref 70–99)
Potassium: 3.6 mmol/L (ref 3.5–5.1)
Sodium: 138 mmol/L (ref 135–145)
Total Bilirubin: 0.4 mg/dL (ref 0.0–1.2)
Total Protein: 7.6 g/dL (ref 6.5–8.1)

## 2024-05-13 LAB — CBC
HCT: 43.7 % (ref 36.0–46.0)
Hemoglobin: 13.6 g/dL (ref 12.0–15.0)
MCH: 26.8 pg (ref 26.0–34.0)
MCHC: 31.1 g/dL (ref 30.0–36.0)
MCV: 86.2 fL (ref 80.0–100.0)
Platelets: 366 K/uL (ref 150–400)
RBC: 5.07 MIL/uL (ref 3.87–5.11)
RDW: 13.1 % (ref 11.5–15.5)
WBC: 6.1 K/uL (ref 4.0–10.5)
nRBC: 0 % (ref 0.0–0.2)

## 2024-05-13 LAB — ETHANOL: Alcohol, Ethyl (B): <15 mg/dL (ref <15)

## 2024-05-13 LAB — URINALYSIS, ROUTINE W REFLEX MICROSCOPIC
Bilirubin Urine: NEGATIVE
Glucose, UA: NEGATIVE mg/dL
Ketones, ur: 20 mg/dL — AB
Nitrite: NEGATIVE
Protein, ur: NEGATIVE mg/dL
Specific Gravity, Urine: 1.016 (ref 1.005–1.030)
pH: 7 (ref 5.0–8.0)

## 2024-05-13 LAB — URINE DRUG SCREEN
Amphetamines: NEGATIVE
Barbiturates: NEGATIVE
Benzodiazepines: NEGATIVE
Cocaine: NEGATIVE
Fentanyl: NEGATIVE
Methadone Scn, Ur: NEGATIVE
Opiates: NEGATIVE
Tetrahydrocannabinol: POSITIVE — AB

## 2024-05-13 LAB — HCG, SERUM, QUALITATIVE: Preg, Serum: NEGATIVE

## 2024-05-13 MED ORDER — ZIPRASIDONE MESYLATE 20 MG IM SOLR
20.0000 mg | Freq: Once | INTRAMUSCULAR | Status: AC
  Administered 2024-05-13: 20 mg via INTRAMUSCULAR
  Filled 2024-05-13: qty 20

## 2024-05-13 MED ORDER — MIRTAZAPINE 7.5 MG PO TABS
7.5000 mg | ORAL_TABLET | Freq: Every day | ORAL | Status: DC
  Administered 2024-05-14 – 2024-05-25 (×12): 7.5 mg via ORAL
  Filled 2024-05-13 (×14): qty 1

## 2024-05-13 MED ORDER — HALOPERIDOL LACTATE 5 MG/ML IJ SOLN
10.0000 mg | Freq: Three times a day (TID) | INTRAMUSCULAR | Status: DC | PRN
  Administered 2024-05-13 – 2024-05-14 (×2): 10 mg via INTRAMUSCULAR
  Filled 2024-05-13: qty 2

## 2024-05-13 MED ORDER — CARIPRAZINE HCL 1.5 MG PO CAPS
3.0000 mg | ORAL_CAPSULE | Freq: Every evening | ORAL | Status: DC
  Filled 2024-05-13: qty 2

## 2024-05-13 MED ORDER — TOPIRAMATE 25 MG PO TABS
50.0000 mg | ORAL_TABLET | Freq: Two times a day (BID) | ORAL | Status: DC
  Administered 2024-05-14 – 2024-05-26 (×26): 50 mg via ORAL
  Filled 2024-05-13 (×27): qty 2

## 2024-05-13 MED ORDER — LORAZEPAM 2 MG/ML IJ SOLN
2.0000 mg | Freq: Once | INTRAMUSCULAR | Status: AC
  Administered 2024-05-13: 2 mg via INTRAMUSCULAR
  Filled 2024-05-13: qty 1

## 2024-05-13 MED ORDER — CARIPRAZINE HCL 1.5 MG PO CAPS
3.0000 mg | ORAL_CAPSULE | Freq: Every evening | ORAL | Status: DC
  Administered 2024-05-14: 3 mg via ORAL
  Filled 2024-05-13 (×2): qty 2

## 2024-05-13 MED ORDER — HALOPERIDOL 1 MG PO TABS
2.0000 mg | ORAL_TABLET | Freq: Once | ORAL | Status: DC
  Filled 2024-05-13: qty 2

## 2024-05-13 MED ORDER — DIPHENHYDRAMINE HCL 25 MG PO CAPS
50.0000 mg | ORAL_CAPSULE | Freq: Three times a day (TID) | ORAL | Status: DC | PRN
  Administered 2024-05-15 – 2024-05-23 (×9): 50 mg via ORAL
  Filled 2024-05-13 (×10): qty 2

## 2024-05-13 MED ORDER — HALOPERIDOL LACTATE 5 MG/ML IJ SOLN
5.0000 mg | Freq: Three times a day (TID) | INTRAMUSCULAR | Status: DC | PRN
  Filled 2024-05-13: qty 1

## 2024-05-13 MED ORDER — TOPIRAMATE 25 MG PO TABS: 50.0000 mg | ORAL_TABLET | Freq: Two times a day (BID) | ORAL | Status: DC

## 2024-05-13 MED ORDER — DIPHENHYDRAMINE HCL 50 MG/ML IJ SOLN
50.0000 mg | Freq: Three times a day (TID) | INTRAMUSCULAR | Status: DC | PRN
  Filled 2024-05-13 (×2): qty 1

## 2024-05-13 MED ORDER — MIRTAZAPINE 7.5 MG PO TABS: 7.5000 mg | ORAL_TABLET | Freq: Every day | ORAL | Status: DC

## 2024-05-13 MED ORDER — LORAZEPAM 2 MG/ML IJ SOLN: 2.0000 mg | Freq: Three times a day (TID) | INTRAMUSCULAR | Status: DC | PRN

## 2024-05-13 MED ORDER — LORAZEPAM 2 MG/ML IJ SOLN
2.0000 mg | Freq: Three times a day (TID) | INTRAMUSCULAR | Status: DC | PRN
  Administered 2024-05-13 – 2024-05-14 (×2): 2 mg via INTRAMUSCULAR
  Filled 2024-05-13 (×2): qty 1

## 2024-05-13 MED ORDER — ALUM & MAG HYDROXIDE-SIMETH 200-200-20 MG/5ML PO SUSP
30.0000 mL | ORAL | Status: DC | PRN
  Administered 2024-05-19 – 2024-05-25 (×12): 30 mL via ORAL
  Filled 2024-05-13 (×12): qty 30

## 2024-05-13 MED ORDER — MAGNESIUM HYDROXIDE 400 MG/5ML PO SUSP
30.0000 mL | Freq: Every day | ORAL | Status: DC | PRN
  Administered 2024-05-16 – 2024-05-25 (×4): 30 mL via ORAL
  Filled 2024-05-13 (×4): qty 30

## 2024-05-13 MED ORDER — HALOPERIDOL 5 MG PO TABS
5.0000 mg | ORAL_TABLET | Freq: Three times a day (TID) | ORAL | Status: DC | PRN
  Administered 2024-05-15 – 2024-05-23 (×10): 5 mg via ORAL
  Filled 2024-05-13 (×11): qty 1

## 2024-05-13 MED ORDER — DIPHENHYDRAMINE HCL 50 MG/ML IJ SOLN
50.0000 mg | Freq: Three times a day (TID) | INTRAMUSCULAR | Status: DC | PRN
  Administered 2024-05-13 – 2024-05-14 (×2): 50 mg via INTRAMUSCULAR

## 2024-05-13 MED ORDER — ACETAMINOPHEN 325 MG PO TABS
650.0000 mg | ORAL_TABLET | Freq: Four times a day (QID) | ORAL | Status: DC | PRN
  Administered 2024-05-14 – 2024-05-26 (×13): 650 mg via ORAL
  Filled 2024-05-13 (×13): qty 2

## 2024-05-13 NOTE — ED Notes (Signed)
 Patients mother, Niels, has patients belongings.

## 2024-05-13 NOTE — Plan of Care (Signed)
  Problem: Safety: Goal: Periods of time without injury will increase Outcome: Progressing   Problem: Coping: Goal: Ability to demonstrate self-control will improve Outcome: Not Progressing   Problem: Coping: Goal: Ability to verbalize frustrations and anger appropriately will improve Outcome: Not Progressing

## 2024-05-13 NOTE — Tx Team (Signed)
 Initial Treatment Plan 05/13/2024 6:04 PM AUDRIONNA LAMPTON FMW:[id number]    PATIENT STRESSORS: Medication change or noncompliance   Substance abuse   Traumatic event     PATIENT STRENGTHS: Physical Health  Special hobby/interest  Supportive family/friends    PATIENT IDENTIFIED PROBLEMS: Acute psychosis (delusions, paranoia)    Alterations in mood (Agitation, anxiety)    Substance Abuse (Marijuana)    Medication noncompliance         DISCHARGE CRITERIA:  Improved stabilization in mood, thinking, and/or behavior Verbal commitment to aftercare and medication compliance Withdrawal symptoms are absent or subacute and managed without 24-hour nursing intervention  PRELIMINARY DISCHARGE PLAN: Outpatient therapy Return to previous living arrangement  PATIENT/FAMILY INVOLVEMENT: This treatment plan has been presented to and reviewed with the patient, SHIRI HODAPP.The patient have been given the opportunity to ask questions and make suggestions.  Jerremy Maione, RN 05/13/2024, 6:04 PM

## 2024-05-13 NOTE — ED Notes (Signed)
 Patient said you are a bitch for not offering me water .

## 2024-05-13 NOTE — ED Notes (Signed)
 Patient currently resting in bed quietly at this time. Sitter at bedside.

## 2024-05-13 NOTE — ED Notes (Signed)
 Pt refused vitals.  Informed RN

## 2024-05-13 NOTE — Progress Notes (Signed)
(  Sleep Hours) -8  (Any PRNs that were needed, meds refused, or side effects to meds)-   (Any disturbances and when (visitation, over night)-N/A  (Concerns raised by the patient)- N/A , pt has been sleep since shift started, pt was too lethargic to receive HS medications earlier, will continue to monitor.   (SI/HI/AVH)-N/A

## 2024-05-13 NOTE — [facility] Group Notes (Signed)
 [facility] Group Notes:  (Nursing/MHT/Case Management/Adjunct)  Date:  05/13/2024  Time:  8:56 PM  Type of Therapy:  Wrap-up group  Participation Level:  Did Not Attend  Participation Quality:    Affect:    Cognitive:    Insight:    Engagement in Group:    Modes of Intervention:    Summary of Progress/Problems:PT didn't attend group.  [name] 05/13/2024, 8:56 PM
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
## 2024-05-13 NOTE — ED Notes (Signed)
 Patient screaming the voices in her forehead are telling her to fire everyone here. This hospital sucks. Patient continues to scream.

## 2024-05-13 NOTE — ED Provider Notes (Signed)
 [place] EMERGENCY DEPARTMENT AT [facility] Provider Note   CSN: [id number] Arrival date & time: 05/13/24  9188     Patient presents with: Psychiatric Evaluation   Stacy Poole is a 33 y.o. female.   HPI     Patient presents because of concern for psychosis and worsening mental state.  According to mother at bedside, she has been out of hospital for the past 2 weeks.  Has since been declining from mental health standpoint.  She is not sure whether or not she has been taking her medications.  Patient currently lives at her own apartment.  Mother brought in the patient voluntarily today due to concerns for ongoing psychosis and mental health decline.  Patient currently denies all complaints.  Denies any chest pain shortness of breath, nausea vomit diarrhea.   Previous medical history reviewed : Patient was last seen in the ED in August 2025.  Was seen because of psychiatric evaluation.  Patient was admitted from the emergency room to outpatient facility.   Prior to Admission medications   Medication Sig Start Date End Date Taking? Authorizing Provider  doxazosin  (CARDURA ) 1 MG tablet Take 1 mg by mouth at bedtime.   Yes [provider]  hydrOXYzine  (ATARAX ) 25 MG tablet Take 1 tablet (25 mg total) by mouth 3 (three) times daily as needed for anxiety. Patient taking differently: Take 25 mg by mouth daily as needed for anxiety. 09/05/21  Yes Hill, Corean Massa, MD  mirtazapine  (REMERON ) 7.5 MG tablet Take 7.5 mg by mouth at bedtime.   Yes [provider]  topiramate  (TOPAMAX ) 50 MG tablet Take 50 mg by mouth in the morning and at bedtime.   Yes [provider]  TYLENOL  500 MG tablet Take 500 mg by mouth daily as needed for mild pain (pain score 1-3), moderate pain (pain score 4-6) or headache.   Yes [provider]  VRAYLAR  3 MG capsule Take 3 mg by mouth at bedtime. 09/03/23  Yes [provider]  Cariprazine  HCl (VRAYLAR )  4.5 MG CAPS Take 4.5 mg by mouth at bedtime. Patient not taking: Reported on 05/13/2024    [provider]  chlorproMAZINE  (THORAZINE ) 100 MG tablet Take 100 mg by mouth at bedtime. Patient not taking: Reported on 05/13/2024 05/12/24   [provider]  escitalopram  (LEXAPRO ) 20 MG tablet Take 20 mg by mouth at bedtime. Patient not taking: Reported on 05/13/2024 09/04/23   [provider]    Allergies: Abilify [aripiprazole], Carbamazepine , Lamictal  [lamotrigine ], Other, Pork-derived products, and Tetracyclines & related    Review of Systems  Constitutional:  Negative for chills and fever.  HENT:  Negative for ear pain and sore throat.   Eyes:  Negative for pain and visual disturbance.  Respiratory:  Negative for cough and shortness of breath.   Cardiovascular:  Negative for chest pain and palpitations.  Gastrointestinal:  Negative for abdominal pain and vomiting.  Genitourinary:  Negative for dysuria and hematuria.  Musculoskeletal:  Negative for arthralgias and back pain.  Skin:  Negative for color change and rash.  Neurological:  Negative for seizures and syncope.  All other systems reviewed and are negative.   Updated Vital Signs BP (!) 120/93 (BP Location: Right Arm)   Pulse 92   Temp 99.1 F (37.3 C) (Oral)   Resp 17   Ht 5' 4 (1.626 m)   Wt 65 kg   LMP 04/07/2024   SpO2 99%   BMI 24.60 kg/m   Physical  Exam Vitals and nursing note reviewed.  Constitutional:      General: She is not in acute distress.    Appearance: She is well-developed.  HENT:     Head: Normocephalic and atraumatic.  Eyes:     Conjunctiva/sclera: Conjunctivae normal.  Cardiovascular:     Rate and Rhythm: Normal rate and regular rhythm.     Heart sounds: No murmur heard. Pulmonary:     Effort: Pulmonary effort is normal. No respiratory distress.     Breath sounds: Normal breath sounds.  Abdominal:     Palpations: Abdomen is soft.     Tenderness: There is no abdominal  tenderness.  Musculoskeletal:        General: No swelling.     Cervical back: Neck supple.  Skin:    General: Skin is warm and dry.     Capillary Refill: Capillary refill takes less than 2 seconds.  Neurological:     Mental Status: She is alert and oriented to person, place, and time.     GCS: GCS eye subscore is 4. GCS verbal subscore is 5. GCS motor subscore is 6.  Psychiatric:        Mood and Affect: Mood normal.        Speech: Speech is rapid and pressured and tangential.        Behavior: Behavior is hyperactive.        Thought Content: Thought content is paranoid.     (all labs ordered are listed, but only abnormal results are displayed) Labs Reviewed  COMPREHENSIVE METABOLIC PANEL WITH GFR - Abnormal; Notable for the following components:      Result Value   CO2 19 (*)    Glucose, Bld 121 (*)    ALT 89 (*)    All other components within normal limits  URINE DRUG SCREEN - Abnormal; Notable for the following components:   Tetrahydrocannabinol POSITIVE (*)    All other components within normal limits  URINALYSIS, ROUTINE W REFLEX MICROSCOPIC - Abnormal; Notable for the following components:   Color, Urine AMBER (*)    APPearance TURBID (*)    Hgb urine dipstick MODERATE (*)    Ketones, ur 20 (*)    Leukocytes,Ua TRACE (*)    Bacteria, UA RARE (*)    All other components within normal limits  ETHANOL  CBC  HCG, SERUM, QUALITATIVE    EKG: EKG Interpretation Date/Time:  Tuesday May 13 2024 09:36:49 EDT Ventricular Rate:  109 PR Interval:  142 QRS Duration:  76 QT Interval:  326 QTC Calculation: 439 R Axis:   -13  Text Interpretation: Sinus tachycardia Abnormal ECG No previous ECGs available Confirmed by Simon Rea [phone]) on 05/13/2024 9:48:32 AM  Radiology: No results found.   Procedures   Medications Ordered in the ED  mirtazapine  (REMERON ) tablet 7.5 mg (has no administration in time range)  cariprazine  (VRAYLAR ) capsule 3 mg (has no  administration in time range)  topiramate  (TOPAMAX ) tablet 50 mg (50 mg Oral Patient Refused/Not Given 05/13/24 0951)  haloperidol  (HALDOL ) tablet 2 mg (2 mg Oral Not Given 05/13/24 0929)  ziprasidone  (GEODON ) injection 20 mg (20 mg Intramuscular Given 05/13/24 0938)  LORazepam  (ATIVAN ) injection 2 mg (2 mg Intramuscular Given 05/13/24 1037)                                    Medical Decision Making Amount and/or Complexity of Data Reviewed Labs:  ordered.  Risk Prescription drug management. Decision regarding hospitalization.     Patient presents because of concern for psychosis and worsening mental state.  According to mother at bedside, she has been out of hospital for the past 2 weeks.  Has since been declining from mental health standpoint.  She is not sure whether or not she has been taking her medications.  Patient currently lives at her own apartment.  Mother brought in the patient voluntarily today due to concerns for ongoing psychosis and mental health decline.  Patient currently denies all complaints.  Denies any chest pain shortness of breath, nausea vomit diarrhea.   Previous medical history reviewed : Patient was last seen in the ED in August 2025.  Was seen because of psychiatric evaluation.  Patient was admitted from the emergency room to outpatient facility.   Upon exam, patient hemodynamically stable.  Patient able to tell me her name and where were at.  No signs of Trauma to the patient's head neck  appreciated.  Patient responding to internal stimuli.  Patient has rapid pressured speech as well.  Mother is concerned about the patient not taking her medication.  Believes she needs inpatient psychiatric admission again.    Was called back to patient's bedside because patient was screaming and acting out.  Yelling at patient's and staff.  Try to get up and leave.  Unfortunately, we will have to order Geodon  20 mg IM.  Cannot de-escalate verbally.  Had to give patient 2 mg of  IM Ativan  as well.  Subsequent patient was able to calm down after this.  Ordered all the patient's home medications.  Rare bacteria and rare leuk esterase.  Not consistent for UTI.  Asymptomatic.  No antibiotics required.  Otherwise, patient medically clear.  Psychiatry evaluated patient.  Patient signed voluntary.  Pending placement.     Final diagnoses:  Psychosis, unspecified psychosis type [facility])    ED Discharge Orders     None          Simon Lavonia SAILOR, MD 05/13/24 1530

## 2024-05-13 NOTE — ED Notes (Signed)
 Pt has one bag containing orange Crocs placed in locker # 30

## 2024-05-13 NOTE — ED Triage Notes (Signed)
 Pt BIB her mother, mother reports that since leaving [facility] pt has been unstable.

## 2024-05-13 NOTE — Progress Notes (Signed)
 05/13/2024 Called [facility] to give report. Waiting on return call.

## 2024-05-13 NOTE — ED Notes (Signed)
 Trying to administer oral medication. Patient called paramedic Francis a fucking idiot and that haldol  kills me. Patient dumped oral medication on bed.

## 2024-05-13 NOTE — Plan of Care (Signed)
   Problem: Safety: Goal: Periods of time without injury will increase Outcome: Progressing   Problem: Activity: Goal: Sleeping patterns will improve Outcome: Progressing

## 2024-05-13 NOTE — Progress Notes (Signed)
 05/13/2024  1445  Called safe transport to transport patient to [facility].

## 2024-05-14 ENCOUNTER — Encounter (HOSPITAL_COMMUNITY): Payer: Self-pay

## 2024-05-14 DIAGNOSIS — F312 Bipolar disorder, current episode manic severe with psychotic features: Secondary | ICD-10-CM | POA: Diagnosis not present

## 2024-05-14 MED ORDER — CARIPRAZINE HCL 1.5 MG PO CAPS
4.5000 mg | ORAL_CAPSULE | Freq: Every evening | ORAL | Status: DC
  Administered 2024-05-14 – 2024-05-18 (×5): 4.5 mg via ORAL
  Filled 2024-05-14 (×5): qty 3

## 2024-05-14 MED ORDER — FLUPHENAZINE HCL 2.5 MG PO TABS
2.5000 mg | ORAL_TABLET | Freq: Two times a day (BID) | ORAL | Status: DC
Start: 2024-05-14 — End: 2024-05-15
  Administered 2024-05-14 – 2024-05-15 (×3): 2.5 mg via ORAL
  Filled 2024-05-14 (×3): qty 1

## 2024-05-14 MED ORDER — TRAZODONE HCL 50 MG PO TABS
50.0000 mg | ORAL_TABLET | Freq: Every evening | ORAL | Status: DC | PRN
  Administered 2024-05-14 – 2024-05-19 (×5): 50 mg via ORAL
  Filled 2024-05-14 (×5): qty 1

## 2024-05-14 MED ORDER — DOXAZOSIN MESYLATE 1 MG PO TABS
1.0000 mg | ORAL_TABLET | Freq: Every day | ORAL | Status: DC
  Administered 2024-05-15 – 2024-05-25 (×11): 1 mg via ORAL
  Filled 2024-05-14 (×15): qty 1

## 2024-05-14 NOTE — Plan of Care (Incomplete)
 On collateral call with mother, Harlie Buening [phone]):  A week ago, yesterday. Picked up in [place]. On day of discharge, stable enough, stable enough to fake it. Was there for two weeks. Over next week up and down. Sometimes lucid, other times all over the place. Sometimes delusional. In close contact, but felt as if she was going to end up back in the hospital. [place] there was a problem with her forehead and that there was something growing out of her forehead. Appeared like a small bump. Clearly delusional and psychotic. Agreed to go with mother to the hospital. Patient's brain was going non-stop with non-stop talking. Word salad. Has not been sleeping. Has been hospitalized multiple times. Some visits are better than others. Some are traumatic,  some are ineffective. [place] a formal complaint at [facility], number of other issues. Made complaints about not putting on Vraylar . 4.5 mg is effective when she's in the hospital. It wipes her out and lowered the dose outpatient. Subjective improvement with thorazine  100 mg. On zyprexa  for a number of years prior to Dr. Akintayo, came off of it. Doesn't think this medication stayed effective. Can't even read. 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 Very aware/involved with international efforts [place]). Has never worked for [employer]. Do not know if she has taken her medication. May be reluctant for an LAI. Will not take medication that gets in the way of her art. Overdosed on Lexapro , said she either did or didn't OD intentionally. Has trialed Clozapine in the past. All for an ACT team, patient had declined. Wants to live on her own. ACT team may be a way of sustaining this.

## 2024-05-14 NOTE — Progress Notes (Signed)
(  Sleep Hours) -8.25  (Any PRNs that were needed, meds refused, or side effects to meds)- Trazodone  50 mg  (Any disturbances and when (visitation, over night)-N/A  (Concerns raised by the patient)- Pt was upset that she has been getting Haldol  injectionsHaldol gives me tardive dyskinesia pt was getting upset, but calmed down when talking to Clinical research associate. Pt informed if she became disruptive through the night the shot she would have to get would be haldol  and benadryl . Pt encouraged to talk to the doctor tomorrow about her medications. Pt stated My doctor dr akintayo gives me klonopin  prn for sleep  pt encouraged to talk to the doctor about getting something for sleep.   (SI/HI/AVH)-denies

## 2024-05-14 NOTE — BH IP Treatment Plan (Signed)
 Interdisciplinary Treatment and Diagnostic Plan Update  05/14/2024 Time of Session:  10:15 AM Stacy Poole MRN: [id number]  Principal Diagnosis: Bipolar 1 disorder, manic, full remission (HCC)  Secondary Diagnoses: Principal Problem:   Bipolar 1 disorder, manic, full remission (HCC)   Current Medications:  Current Facility-Administered Medications  Medication Dose Route Frequency Provider Last Rate Last Admin   acetaminophen  (TYLENOL ) tablet 650 mg  650 mg Oral Q6H PRN Motley-Mangrum, Jadeka A, PMHNP   650 mg at 05/14/24 1041   alum & mag hydroxide-simeth (MAALOX/MYLANTA) 200-200-20 MG/5ML suspension 30 mL  30 mL Oral Q4H PRN Motley-Mangrum, Jadeka A, PMHNP       cariprazine  (VRAYLAR ) capsule 3 mg  3 mg Oral QPM Motley-Mangrum, Jadeka A, PMHNP   3 mg at 05/14/24 0319   haloperidol  (HALDOL ) tablet 5 mg  5 mg Oral TID PRN Motley-Mangrum, Jadeka A, PMHNP       And   diphenhydrAMINE  (BENADRYL ) capsule 50 mg  50 mg Oral TID PRN Motley-Mangrum, Jadeka A, PMHNP       haloperidol  lactate (HALDOL ) injection 5 mg  5 mg Intramuscular TID PRN Motley-Mangrum, Jadeka A, PMHNP       And   diphenhydrAMINE  (BENADRYL ) injection 50 mg  50 mg Intramuscular TID PRN Motley-Mangrum, Jadeka A, PMHNP       And   LORazepam  (ATIVAN ) injection 2 mg  2 mg Intramuscular TID PRN Motley-Mangrum, Jadeka A, PMHNP       haloperidol  lactate (HALDOL ) injection 10 mg  10 mg Intramuscular TID PRN Motley-Mangrum, Jadeka A, PMHNP   10 mg at 05/14/24 1044   And   diphenhydrAMINE  (BENADRYL ) injection 50 mg  50 mg Intramuscular TID PRN Motley-Mangrum, Jadeka A, PMHNP   50 mg at 05/14/24 1044   And   LORazepam  (ATIVAN ) injection 2 mg  2 mg Intramuscular TID PRN Motley-Mangrum, Jadeka A, PMHNP   2 mg at 05/14/24 1043   doxazosin  (CARDURA ) tablet 1 mg  1 mg Oral QHS Rollene Katz, MD       magnesium  hydroxide (MILK OF MAGNESIA) suspension 30 mL  30 mL Oral Daily PRN Motley-Mangrum, Jadeka A, PMHNP       mirtazapine   (REMERON ) tablet 7.5 mg  7.5 mg Oral QHS Motley-Mangrum, Jadeka A, PMHNP       topiramate  (TOPAMAX ) tablet 50 mg  50 mg Oral BID Motley-Mangrum, Jadeka A, PMHNP   50 mg at 05/14/24 9161   PTA Medications: Medications Prior to Admission  Medication Sig Dispense Refill Last Dose/Taking   Cariprazine  HCl (VRAYLAR ) 4.5 MG CAPS Take 4.5 mg by mouth at bedtime. (Patient not taking: Reported on 05/13/2024)      chlorproMAZINE  (THORAZINE ) 100 MG tablet Take 100 mg by mouth at bedtime. (Patient not taking: Reported on 05/13/2024)      doxazosin  (CARDURA ) 1 MG tablet Take 1 mg by mouth at bedtime.      escitalopram  (LEXAPRO ) 20 MG tablet Take 20 mg by mouth at bedtime. (Patient not taking: Reported on 05/13/2024)      hydrOXYzine  (ATARAX ) 25 MG tablet Take 1 tablet (25 mg total) by mouth 3 (three) times daily as needed for anxiety. (Patient taking differently: Take 25 mg by mouth daily as needed for anxiety.) 90 tablet 0    mirtazapine  (REMERON ) 7.5 MG tablet Take 7.5 mg by mouth at bedtime.      topiramate  (TOPAMAX ) 50 MG tablet Take 50 mg by mouth in the morning and at bedtime.      TYLENOL  500 MG  tablet Take 500 mg by mouth daily as needed for mild pain (pain score 1-3), moderate pain (pain score 4-6) or headache.      VRAYLAR  3 MG capsule Take 3 mg by mouth at bedtime.       Patient Stressors: Medication change or noncompliance   Substance abuse   Traumatic event    Patient Strengths: Physical Health  Special hobby/interest  Supportive family/friends   Treatment Modalities: Medication Management, Group therapy, Case management,  1 to 1 session with clinician, Psychoeducation, Recreational therapy.   Physician Treatment Plan for Primary Diagnosis: Bipolar 1 disorder, manic, full remission (HCC) Long Term Goal(s):     Short Term Goals:    Medication Management: Evaluate patient's response, side effects, and tolerance of medication regimen.  Therapeutic Interventions: 1 to 1 sessions, Unit Group  sessions and Medication administration.  Evaluation of Outcomes: Not Progressing  Physician Treatment Plan for Secondary Diagnosis: Principal Problem:   Bipolar 1 disorder, manic, full remission (HCC)  Long Term Goal(s):     Short Term Goals:       Medication Management: Evaluate patient's response, side effects, and tolerance of medication regimen.  Therapeutic Interventions: 1 to 1 sessions, Unit Group sessions and Medication administration.  Evaluation of Outcomes: Not Progressing   RN Treatment Plan for Primary Diagnosis: Bipolar 1 disorder, manic, full remission (HCC) Long Term Goal(s): Knowledge of disease and therapeutic regimen to maintain health will improve  Short Term Goals: Ability to remain free from injury will improve, Ability to verbalize frustration and anger appropriately will improve, Ability to demonstrate self-control, Ability to participate in decision making will improve, Ability to verbalize feelings will improve, Ability to disclose and discuss suicidal ideas, Ability to identify and develop effective coping behaviors will improve, and Compliance with prescribed medications will improve  Medication Management: RN will administer medications as ordered by provider, will assess and evaluate patient's response and provide education to patient for prescribed medication. RN will report any adverse and/or side effects to prescribing provider.  Therapeutic Interventions: 1 on 1 counseling sessions, Psychoeducation, Medication administration, Evaluate responses to treatment, Monitor vital signs and CBGs as ordered, Perform/monitor CIWA, COWS, AIMS and Fall Risk screenings as ordered, Perform wound care treatments as ordered.  Evaluation of Outcomes: Not Progressing   LCSW Treatment Plan for Primary Diagnosis: Bipolar 1 disorder, manic, full remission (HCC) Long Term Goal(s): Safe transition to appropriate next level of care at discharge, Engage patient in therapeutic  group addressing interpersonal concerns.  Short Term Goals: Engage patient in aftercare planning with referrals and resources, Increase social support, Increase ability to appropriately verbalize feelings, Increase emotional regulation, Facilitate acceptance of mental health diagnosis and concerns, Facilitate patient progression through stages of change regarding substance use diagnoses and concerns, Identify triggers associated with mental health/substance abuse issues, and Increase skills for wellness and recovery  Therapeutic Interventions: Assess for all discharge needs, 1 to 1 time with Social worker, Explore available resources and support systems, Assess for adequacy in community support network, Educate family and significant other(s) on suicide prevention, Complete Psychosocial Assessment, Interpersonal group therapy.  Evaluation of Outcomes: Not Progressing   Progress in Treatment: Attending groups: No. Participating in groups: Yes. Taking medication as prescribed: Yes. Toleration medication: N/A Family/Significant other contact made: consents are pending Patient understands diagnosis: No. Discussing patient identified problems/goals with staff: No. Medical problems stabilized or resolved: Yes. Denies suicidal/homicidal ideation: Yes. Issues/concerns per patient self-inventory: No.  New problem(s) identified:  No  New Short Term/Long Term Goal(s):  medication stabilization, elimination of SI thoughts, development of comprehensive mental wellness plan.    Patient Goals:  I have a headache and need medical attention. I have a good therapist.    Discharge Plan or Barriers:  Patient recently admitted. CSW will continue to follow and assess for appropriate referrals and possible discharge planning.    Reason for Continuation of Hospitalization: Medication stabilization  Psychosis  Estimated Length of Stay:  5 - 7 days  Last 3 [place] Suicide Severity Risk  Score: Flowsheet Row Ad[facility] (Current[facility] Most recent reading at 05/13/2024  4:00 PM ED from 05/13/2024 in [facility] Emergency Department at [facility] Most recent reading at 05/13/2024  8:24 AM ED from 04/21/2024 in [facility] Emergency Department at [facility] Most recent reading at 04/22/2024  5:46 AM  C-SSRS RISK CATEGORY No Risk No Risk No Risk    Last PHQ 2/9 Scores:    09/21/2023    9:29 AM 01/14/2022    4:27 AM 09/15/2021   11:44 PM  Depression screen PHQ 2/9  Decreased Interest 1 0 0  Down, Depressed, Hopeless 1 1 1   PHQ - 2 Score 2 1 1   Altered sleeping  0 3  Tired, decreased energy  0 3  Change in appetite  0 3  Feeling bad or failure about yourself   1 2  Trouble concentrating  0 2  Moving slowly or fidgety/restless  0 0  Suicidal thoughts  0 0  PHQ-9 Score  2 14  Difficult doing work/chores  Not difficult at all Very difficult    Scribe for Treatment Team: Brissia Delisa O Martyna Thorns, LCSWA 05/14/2024 11:18 AM

## 2024-05-14 NOTE — Progress Notes (Signed)
 Pt stated that she needed her evening medications moved to night time because she needed to take them before she goes to bed. I need my toprimax, my doxasosin moved to bedtime

## 2024-05-14 NOTE — Plan of Care (Signed)
   Problem: Education: Goal: Emotional status will improve Outcome: Progressing Goal: Mental status will improve Outcome: Progressing Goal: Verbalization of understanding the information provided will improve Outcome: Progressing   Problem: Activity: Goal: Interest or engagement in activities will improve Outcome: Progressing Goal: Sleeping patterns will improve Outcome: Progressing
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
## 2024-05-14 NOTE — Group Note (Signed)
 Recreation Therapy Group Note   Group Topic:Goal Setting  Group Date: 05/14/2024 Start Time: 1028 End Time: 1045 Facilitators: Domnique Vantine-McCall, LRT,CTRS Location: [address] Day[address]   Group Topic: Goal Setting  Goal Area(s) Addresses:  Patient will participate in discussion of what a goal is. Patient will successfully identify goals they want to reach at different time frames.  Behavioral Response:   Intervention: Group Conversation, Worksheet  Activity: LRT and patients discussed what goals were. Patients were then given a worksheet were they identified goals they wanted to accomplish in a week, month, year and 5 years. Patient then had to identify any obstacles that would interfere with reaching those goals, what they would need to reach goals and what they can start doing now to work towards goals.  Education: Goal Setting  Education Outcome: Acknowledges education   Affect/Mood: N/A   Participation Level: Did not attend    Clinical Observations/Individualized Feedback:      Plan: Continue to engage patient in RT group sessions 2-3x/week.   Bert Ptacek-McCall, LRT,CTRS 05/14/2024 12:53 PM

## 2024-05-14 NOTE — Progress Notes (Signed)
 Patient was disruptive in group and staff was unable to redirect. She was cursing staff and saying she did not care. Agitation protocol given.

## 2024-05-15 DIAGNOSIS — F312 Bipolar disorder, current episode manic severe with psychotic features: Secondary | ICD-10-CM | POA: Diagnosis not present

## 2024-05-15 LAB — LIPID PANEL
Cholesterol: 151 mg/dL (ref 0–200)
HDL: 60 mg/dL (ref >40)
LDL Cholesterol: 80 mg/dL (ref 0–99)
Total CHOL/HDL Ratio: 2.5 ratio
Triglycerides: 56 mg/dL (ref <150)
VLDL: 11 mg/dL (ref 0–40)

## 2024-05-15 LAB — HEMOGLOBIN A1C
Hgb A1c MFr Bld: 4.9 % (ref 4.8–5.6)
Mean Plasma Glucose: 93.93 mg/dL

## 2024-05-15 LAB — TSH: TSH: 1.34 u[IU]/mL (ref 0.350–4.500)

## 2024-05-15 MED ORDER — BENZTROPINE MESYLATE 0.5 MG PO TABS
0.5000 mg | ORAL_TABLET | Freq: Two times a day (BID) | ORAL | Status: DC
  Administered 2024-05-15 – 2024-05-26 (×23): 0.5 mg via ORAL
  Filled 2024-05-15 (×23): qty 1

## 2024-05-15 MED ORDER — CLONAZEPAM 0.5 MG PO TABS
1.0000 mg | ORAL_TABLET | Freq: Once | ORAL | Status: AC
Start: 2024-05-15 — End: 2024-05-15
  Administered 2024-05-15: 1 mg via ORAL
  Filled 2024-05-15: qty 2

## 2024-05-15 MED ORDER — FLUPHENAZINE HCL 5 MG PO TABS
5.0000 mg | ORAL_TABLET | Freq: Two times a day (BID) | ORAL | Status: DC
  Administered 2024-05-15 – 2024-05-16 (×2): 5 mg via ORAL
  Filled 2024-05-15 (×2): qty 1

## 2024-05-15 NOTE — Group Note (Signed)
 Recreation Therapy Group Note   Group Topic:Self-Esteem  Group Date: 05/15/2024 Start Time: 1010 End Time: 1034 Facilitators: Hilda Wexler-McCall, LRT,CTRS Location: [address] Dayroom   Group Topic: Self-Esteem  Goal Area(s) Addresses:  Patient will successfully identify positive attributes about themselves.  Patient will identify healthy ways to increase self-esteem. Patient will acknowledge benefit(s) of improved self-esteem.   Behavioral Response: Disruptive  Intervention: Worksheet, Markers  Activity: Pictures of Me. LRT and patients discussed what identity is. Patients then identified at least one thing that identifies them. Patients were given a worksheet with four separate picture frame. In each frame, patients were to draw how they see themselves . Patients could represent parts of that identity through objects used in that role and symbols.  Education: Self-Esteem, Discharge Planning  Education Outcome: Acknowledges education/In group clarification offered/Needs additional education   Affect/Mood: Flat   Participation Level: Minimal   Participation Quality: Moderate Cues   Behavior: Disruptive   Speech/Thought Process: Delusional   Insight: Impaired   Judgement: Impaired   Modes of Intervention: Art and Music   Patient Response to Interventions:  Disengaged   Education Outcome:  In group clarification offered    Clinical Observations/Individualized Feedback: Pt was disruptive and making up raps to the music playing group. Pt was saying things like she was [employer] and [employer] ([employer]). Pt was also saying things about Obama and random other inappropriate comments. Pt was a distraction and disruptive to peers. Pt left and didn't return.      Plan: Continue to engage patient in RT group sessions 2-3x/week.   Stacy Poole, LRT,CTRS 05/15/2024 11:27 AM

## 2024-05-15 NOTE — Progress Notes (Signed)
   05/15/24 0942  Psych Admission Type (Psych Patients Only)  Admission Status Voluntary  Psychosocial Assessment  Patient Complaints Anxiety  Eye Contact Intense  Facial Expression Anxious;Animated  Affect Anxious  Speech Pressured  Interaction Intrusive;Assertive  Motor Activity Hyperactive  Appearance/Hygiene Unremarkable  Behavior Characteristics Impulsive;Intrusive  Mood Preoccupied  Thought Process  Coherency Tangential;Flight of ideas  Content Preoccupation  Delusions Grandeur  Perception Derealization  Hallucination None reported or observed  Judgment Impaired  Confusion Mild  Danger to Self  Current suicidal ideation? Denies  Agreement Not to Harm Self Yes  Description of Agreement verbal  Danger to Others  Danger to Others None reported or observed

## 2024-05-15 NOTE — Plan of Care (Signed)
   Problem: Education: Goal: Verbalization of understanding the information provided will improve Outcome: Progressing   Problem: Education: Goal: Mental status will improve Outcome: Not Progressing
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
## 2024-05-15 NOTE — Group Note (Signed)
 Date:  05/15/2024 Time:  10:42 AM  Group Topic/Focus:  Goals Group:   The focus of this group is to help patients establish daily goals to achieve during treatment and discuss how the patient can incorporate goal setting into their daily lives to aide in recovery. Orientation:   The focus of this group is to educate the patient on the purpose and policies of crisis stabilization and provide a format to answer questions about their admission.  The group details unit policies and expectations of patients while admitted.    Participation Level:  Minimal  Participation Quality:  Appropriate  Affect:  Appropriate  Cognitive:  Appropriate  Insight: Appropriate  Engagement in Group:  Engaged  Modes of Intervention:  Discussion  Additional Comments:  Pt was unable to express a goal and said she wants to sleep.  Stacy Poole 05/15/2024, 10:42 AM

## 2024-05-15 NOTE — Group Note (Signed)
 Date:  05/15/2024 Time:  8:27 PM  Group Topic/Focus:  Wrap-Up Group:   The focus of this group is to help patients review their daily goal of treatment and discuss progress on daily workbooks.    Participation Level:  Active  Participation Quality:  Appropriate  Affect:  Appropriate  Cognitive:  Appropriate  Insight: Appropriate  Engagement in Group:  Engaged  Modes of Intervention:  Education and Exploration  Additional Comments:  Patient attended and participated in group tonight.  She reports that the best part of her day was going to the lost and found  She got some clothing items.  Gwenn Chillington Dacosta 05/15/2024, 8:27 PM

## 2024-05-15 NOTE — Progress Notes (Signed)
(  Sleep Hours) -4.75  (Any PRNs that were needed, meds refused, or side effects to meds)- Trazodone  50 mg  (Any disturbances and when (visitation, over night)-N/A  (Concerns raised by the patient)- none  (SI/HI/AVH)-denies

## 2024-05-15 NOTE — B[facility] Counselor (Signed)
 Adult Comprehensive Assessment  Patient ID: Stacy Poole, female   DOB: 17-Mar-1991, 33 y.o.   MRN: [id number]  Information Source: Information source: Patient  Current Stressors:  Patient states their primary concerns and needs for treatment are:: I had stayed up for several days .There's a bloodclot in my brain but it's fine at the moment. Patient presented with mania with psychosis and paranoia. Upon assessment, patient appeared disorganized stating she works for [employer], is a member of the [employer], has protected [employer] when he was in office, and has a blood clot in her brain which is why she's here at [facility]. Patient states their goals for this hospitilization and ongoing recovery are:: I'm trying to get better pnysically so I can go back to work Educational / Learning stressors: None reported Employment / Job issues: None reported Family Relationships: None reported Surveyor, quantity / Lack of resources (include bankruptcy): None reported Housing / Lack of housing: None reported Physical health (include injuries & life threatening diseases): None reported Social relationships: None reported Substance abuse: UDS positive for THC Bereavement / Loss: None reported  Living/Environment/Situation:  Living Arrangements: Parent Living conditions (as described by patient or guardian): During assessment, patient reported living alone, however according to collateral from her mother, she lives with her mother. Who else lives in the home?: Patient and her mother. How long has patient lived in current situation?: Unknown, as patient insisted she lives alone. What is atmosphere in current home: Comfortable  Family History:  Marital status: Single Are you sexually active?: No What is your sexual orientation?: Heterosexual Has your sexual activity been affected by drugs, alcohol , medication, or emotional stress?: No Does patient have children?: No  Childhood History:  By whom was/is the  patient raised?: Mother Additional childhood history information: None reported Description of patient's relationship with caregiver when they were a child: UTA Patient's description of current relationship with people who raised him/her: My mom helps me out. Patient gave verbal and written consent to contact her mother for additional collateral. How were you disciplined when you got in trouble as a child/adolescent?: UTA Does patient have siblings?: No (UTA) Did patient suffer any verbal/emotional/physical/sexual abuse as a child?: No Did patient suffer from severe childhood neglect?: No Has patient ever been sexually abused/assaulted/raped as an adolescent or adult?: No Was the patient ever a victim of a crime or a disaster?: No Witnessed domestic violence?: No Has patient been affected by domestic violence as an adult?: No  Education:  Highest grade of school patient has completed: UTA Currently a Consulting civil engineer?: No Learning disability?: No  Employment/Work Situation:   Employment Situation: Unemployed Work Stressors: Patient reported having various jobs but was unable to specify which one she's currently working, how long she's worked there, and what tasks she completes. Patient's Job has Been Impacted by Current Illness: No What is the Longest Time Patient has Held a Job?: UTA Where was the Patient Employed at that Time?: UTA Has Patient ever Been in the [employer]?: No  Financial Resources:   Surveyor, quantity resources: Medicaid, Support from parents / caregiver Does patient have a Lawyer or guardian?: No  Alcohol /Substance Abuse:   What has been your use of drugs/alcohol  within the last 12 months?: Patient endorsed the use of THC and UDS was positive for THC. If attempted suicide, did drugs/alcohol  play a role in this?: No (Patient with intentional overdose on Lexapro  in recent weeks according to collateral from pt's mother.) Alcohol /Substance Abuse Treatment Hx: Denies past  history  If yes, describe treatment: N/A Has alcohol /substance abuse ever caused legal problems?: No  Social Support System:   Forensic psychologist System: Fair Museum/gallery exhibitions officer System: my boyfriend but I need to break up with him because he doesn't know how to take care of me at all. He's a good person but I need to focus on making money and my career. Type of faith/religion: I'm muslim but not converted all of the way. How does patient's faith help to cope with current illness?: UTA  Leisure/Recreation:   Do You Have Hobbies?: No  Strengths/Needs:   What is the patient's perception of their strengths?: I'm really good at my job. 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 Patient states they can use these personal strengths during their treatment to contribute to their recovery: I've saved lives Patient states these barriers may affect/interfere with their treatment: UTA Patient states these barriers may affect their return to the community: UTA Other important information patient would like considered in planning for their treatment: None reported  Discharge Plan:   Currently receiving community mental health services: Yes (From Whom) (Dr. Akintayo for medication management) Patient states concerns and preferences for aftercare planning are: Follow-up with medication maangement and therapy providers Patient states they will know when they are safe and ready for discharge when: UTA Does patient have access to transportation?: Yes Does patient have financial barriers related to discharge medications?: No (Patient is supported by her mother) Patient description of barriers related to discharge medications: None reported Will patient be returning to same living situation after discharge?: Yes  Summary/Recommendations:   Summary and Recommendations (to be completed by the evaluator): Stacy Poole is a 33 year old female who  was voluntarily admitted to [facility] from [facility] ED at [facility] due to a manic episode with significant psychosis and paranoia. Patient reportedly was discharged from another inpatient hospital a little over a week ago and has had ups and downs since then according to her mother. Patient endorsed the use of illicit, mood-altering substances including the use of mariuana. Patient's urinary drug screen was positive for THC. Upon assessment, patient was cooperative but very tangential with pressured speech. She endorsed having an outpatient provider for medication management named Dr. Akintayo. CSW team to make appropriate referrals prior to discharge.While here, Stacy Poole can benefit from crisis stabilization, medication management, therapeutic milieu, and referrals for services.   Stacy Poole, LCSWA 05/15/2024

## 2024-05-15 NOTE — Plan of Care (Signed)
   Problem: Education: Goal: Emotional status will improve Outcome: Progressing Goal: Mental status will improve Outcome: Progressing   Problem: Activity: Goal: Interest or engagement in activities will improve Outcome: Progressing Goal: Sleeping patterns will improve Outcome: Progressing

## 2024-05-15 NOTE — Progress Notes (Signed)
 Recreation Therapy Notes  INPATIENT RECREATION THERAPY ASSESSMENT  Patient Details Name: Stacy Poole MRN: [id number] DOB: 10/21/1990 Today's Date: 05/15/2024       Information Obtained From: Patient  Able to Participate in Assessment/Interview: Yes  Patient Presentation: Hyperverbal (Pt was rambling on about various things. Pt was paranoid,delusional and needed redirection. 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.)  Reason for Admission (Per Patient): Other (Comments) (Pt stated she works for [employer] and worked for [employer] since 2009.)  Patient Stressors: Other (Comment) ([place] bombing [place])  Coping Skills:   Sports, TV, Music, Exercise, Meditate, Deep Breathing, Art, Talk, Prayer, Read  Leisure Interests (2+):  Sports - Other (Comment) (Soccer)  Frequency of Recreation/Participation: Other (Comment) (as much as I can)  Awareness of Community Resources:  Yes  Community Resources:  Coffee Shop, Engineering geologist, [employer]  Current Use: No  If no, Barriers?: Transportation  Expressed Interest in [employer] Information: No  [employer] 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, 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  Patient Main Form of Transportation: Set designer  Patient Strengths:  having no weakness  Patient Identified Areas of Improvement:  drop it down through the mic, snake the shark with the bit  Patient Goal for Hospitalization:  keep it down, control attitude and life  Current SI (including self-harm):  No  Current HI:  No  Current AVH: No  Staff Intervention Plan: Group Attendance, Collaborate with Interdisciplinary Treatment Team  Consent to Intern Participation: N/A   Carrera Kiesel-McCall, LRT,CTRS Aryia Delira A Conny Moening-McCall 05/15/2024, 12:47 PM

## 2024-05-16 DIAGNOSIS — F312 Bipolar disorder, current episode manic severe with psychotic features: Secondary | ICD-10-CM | POA: Diagnosis not present

## 2024-05-16 MED ORDER — CLONAZEPAM 0.5 MG PO TABS
0.5000 mg | ORAL_TABLET | Freq: Once | ORAL | Status: AC
  Administered 2024-05-16: 0.5 mg via ORAL
  Filled 2024-05-16: qty 1

## 2024-05-16 MED ORDER — CHLORPROMAZINE HCL 25 MG PO TABS
100.0000 mg | ORAL_TABLET | Freq: Every day | ORAL | Status: DC
  Administered 2024-05-16 – 2024-05-20 (×5): 100 mg via ORAL
  Filled 2024-05-16 (×5): qty 4

## 2024-05-16 NOTE — Progress Notes (Signed)
 Dar Note: Patient reports voices in her head attacking her and having argument with her lawyer.  Reports difficulty staying asleep.  PRN Benadryl  and Haldol  given orally with good effect.

## 2024-05-16 NOTE — Group Note (Signed)
 Recreation Therapy Group Note   Group Topic:Leisure Education  Group Date: 05/16/2024 Start Time: 1019 End Time: 1108 Facilitators: Jevon Shells-McCall, LRT,CTRS Location: [address] Dayroom   Group Topic: Leisure Education   Goal Area(s) Addresses:  Patient will successfully demonstrate knowledge of leisure and recreation interests. Patient will successfully identify benefits of leisure participation.  Patient will verbalize appropriate recreation activities to use post discharge.   Behavioral Response:    Intervention: Guess the Lyric   Activity: LRT facilitated a competitive group game that had patients guess the missing lyric to songs presented. Patients had 6 categories (Pop, Rock, R&B, Dance, Indie and Hip Hop) to choose from. Patient would spin the flicker and whatever category the spinner landed on, the patient would be read a line from that song. If they had the correct answer, they kept the card. If they made the wrong answer, everyone else got a chance to steal the point. The person with the most cards at the end, was the winner.   Education:  Leisure Education, Publishing copy Outcome: Acknowledges education   Affect/Mood: N/A   Participation Level: Did not attend    Clinical Observations/Individualized Feedback:     Plan: Continue to engage patient in RT group sessions 2-3x/week.   Anaid Haney-McCall, LRT,CTRS 05/16/2024 1:18 PM

## 2024-05-16 NOTE — [facility] Group Notes (Signed)
 Spirituality Group   Description: Participant directed exploration of values, beliefs and meaning   Following a brief framework of chaplain's role and ground rules of group behavior, participants are invited to share concerns or questions that engage spiritual life. Emphasis placed on common themes and shared experiences and ways to make meaning and clarify living into one's values.   Theory/Process/Goal: Utilize the theoretical framework of group therapy established by Celena Kite, Relational Cultural Theory and Rogerian approaches to facilitate relational empathy and use of the "here and now" to foster reflection, self-awareness, and sharing.   Observations: Stacy Poole joined peers in participating and helped to encourage a way for taking turns while speaking, this helped to foster trust and sharing in a more meaningful way.  Stacy Poole L. [name].Div

## 2024-05-16 NOTE — Group Note (Signed)
 Date:  05/16/2024 Time:  8:41 PM  Group Topic/Focus:  Wrap-Up Group:   The focus of this group is to help patients review their daily goal of treatment and discuss progress on daily workbooks.    Participation Level:  Active  Participation Quality:  Appropriate  Affect:  Appropriate  Cognitive:  Appropriate  Insight: Appropriate  Engagement in Group:  Engaged  Modes of Intervention:  Education and Exploration  Additional Comments:  Patient attended and participated in group tonight. She reports that her goal today was to be more and be her best. She did meet her goal today  Stacy Poole 05/16/2024, 8:41 PM
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
## 2024-05-16 NOTE — Plan of Care (Signed)
  Problem: Health Behavior/Discharge Planning: Goal: Compliance with treatment plan for underlying cause of condition will improve Outcome: Progressing   Problem: Activity: Goal: Interest or engagement in activities will improve Outcome: Progressing

## 2024-05-17 DIAGNOSIS — F312 Bipolar disorder, current episode manic severe with psychotic features: Secondary | ICD-10-CM | POA: Diagnosis not present

## 2024-05-17 MED ORDER — LORAZEPAM 1 MG PO TABS
1.0000 mg | ORAL_TABLET | Freq: Three times a day (TID) | ORAL | Status: DC | PRN
  Administered 2024-05-17 – 2024-05-22 (×8): 1 mg via ORAL
  Filled 2024-05-17 (×8): qty 1

## 2024-05-17 NOTE — BHH Group Notes (Signed)
 LCSW Wellness Group Note   05/17/2024 1:00pm  Type of Group and Topic: Psychoeducational Group:  Wellness  Participation Level:  Over-Active  Description of Group  Wellness group introduces the topic and its focus on developing healthy habits across the spectrum and its relationship to a decrease in hospital admissions.  Six areas of wellness are discussed: physical, social spiritual, intellectual, occupational, and emotional.  Patients are asked to consider their current wellness habits and to identify areas of wellness where they are interested and able to focus on improvements.    Therapeutic Goals Patients will understand components of wellness and how they can positively impact overall health.  Patients will identify areas of wellness where they have developed good habits. Patients will identify areas of wellness where they would like to make improvements.    Summary of Patient Progress: pt was present for the first half of group, required redirection as she spoke frequently and interrupted others. Pt left on her own and did not return.      Therapeutic Modalities: Cognitive Behavioral Therapy Psychoeducation    Bridget Cordella Simmonds, LCSW

## 2024-05-17 NOTE — Plan of Care (Signed)
  Problem: Education: Goal: Emotional status will improve Outcome: Not Progressing Goal: Mental status will improve Outcome: Not Progressing   Problem: Coping: Goal: Ability to verbalize frustrations and anger appropriately will improve Outcome: Not Progressing

## 2024-05-17 NOTE — Progress Notes (Signed)
(  Sleep Hours) - 7.5 (Any PRNs that were needed, meds refused, or side effects to meds)- none (Any disturbances and when (visitation, over night)-none (Concerns raised by the patient)-none   (SI/HI/AVH)- denies

## 2024-05-17 NOTE — BHH Group Notes (Signed)
 Adult Psychoeducational Group Note  Date:  05/17/2024 Time:  12:48 PM  Group Topic/Focus:  Goals Group:   The focus of this group is to help patients establish daily goals to achieve during treatment and discuss how the patient can incorporate goal setting into their daily lives to aide in recovery. Orientation:   The focus of this group is to educate the patient on the purpose and policies of crisis stabilization and provide a format to answer questions about their admission.  The group details unit policies and expectations of patients while admitted.  Participation Level:  Active  Participation Quality:  Monopolizing  Affect:  Not Congruent  Cognitive:  Disorganized  Insight: Limited  Engagement in Group:  Monopolizing  Modes of Intervention:  Discussion  Additional Comments:  Pt attended the goals group and remained engaged but disorganized throughout the duration of the group.   Mirjana Tarleton O 05/17/2024, 12:48 PM
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
## 2024-05-17 NOTE — Progress Notes (Signed)
   05/17/24 2100  Psych Admission Type (Psych Patients Only)  Admission Status Voluntary  Psychosocial Assessment  Patient Complaints Crying spells;Hyperactivity  Eye Contact Fair  Facial Expression Anxious  Affect Appropriate to circumstance;Labile  Speech Pressured  Interaction Assertive;Intrusive  Motor Activity Fidgety  Appearance/Hygiene Layered clothes  Behavior Characteristics Anxious;Fidgety  Mood Preoccupied  Thought Chartered certified accountant of ideas;Tangential  Content Preoccupation  Delusions Grandeur  Perception Derealization  Hallucination None reported or observed  Judgment Impaired  Confusion Mild  Danger to Self  Current suicidal ideation? Denies

## 2024-05-17 NOTE — Progress Notes (Signed)
   05/17/24 1039  Psych Admission Type (Psych Patients Only)  Admission Status Voluntary  Psychosocial Assessment  Patient Complaints Hyperactivity  Eye Contact Fair  Facial Expression Flat  Affect Preoccupied  Speech Rapid;Pressured  Interaction Needy;Attention-seeking  Motor Activity Hyperactive;Restless;Pacing  Appearance/Hygiene Layered clothes  Behavior Characteristics Anxious;Fidgety;Restless  Mood Preoccupied  Thought Process  Coherency Tangential;Flight of ideas  Content Preoccupation  Delusions Grandeur  Perception Derealization  Hallucination None reported or observed  Judgment Impaired  Confusion Mild  Danger to Self  Current suicidal ideation? Denies  Agreement Not to Harm Self Yes  Description of Agreement Verbal  Danger to Others  Danger to Others None reported or observed

## 2024-05-17 NOTE — Group Note (Signed)
 Date:  05/17/2024 Time:  8:36 PM  Group Topic/Focus:  Wrap-Up Group:   The focus of this group is to help patients review their daily goal of treatment and discuss progress on daily workbooks.    Participation Level:  Active  Participation Quality:  Appropriate  Affect:  Appropriate  Cognitive:  Oriented  Insight: Limited  Engagement in Group:  Engaged  Modes of Intervention:  Education and Exploration  Additional Comments:  Patient attended and participated in group tonight. She reports that today she learn that not all people with red hair is nice.  Gwenn Chillington Dacosta 05/17/2024, 8:36 PM

## 2024-05-17 NOTE — Plan of Care (Signed)
   Problem: Education: Goal: Knowledge of [facility] Education information/materials will improve Outcome: Progressing Goal: Emotional status will improve Outcome: Progressing Goal: Mental status will improve Outcome: Progressing Goal: Verbalization of understanding the information provided will improve Outcome: Progressing

## 2024-05-17 NOTE — Progress Notes (Addendum)
 [facility] MD Progress Note  05/17/2024 11:17 AM Stacy Poole  MRN:  [id number]  Principal Problem: Severe manic bipolar 1 disorder with psychotic behavior (HCC) Diagnosis: Principal Problem:   Severe manic bipolar 1 disorder with psychotic behavior (HCC)   Reason for Admission:  Stacy Poole is a 33 y.o. female  with a past psychiatric history of schizophrenia, schizoaffective disorder, bipolar type, bipolar disorder, schizophrenia, stimulant-induced psychosis, alcohol  use disorder in remission, cannabis use disorder, PTSD. Patient initially arrived to [facility] on 9/9 for manic episode with significant psychosis and paranoia, and admitted to [facility] on 9/9 for crisis stabilization. Of note, was recently discharged from [facility] and required IM Geodon  20 mg for severe agitation and irritability. PMHx is significant for abnormal uterine bleeding.  (admitted on 05/13/2024, total  LOS: 4 days )  Overnight events: Slept 7.5 hours. VSS. NAEO.    On interview: Patient is still in florid psychosis and delusional, but more conversational this morning and in less distress.  Received warm handoff from previous provider. Pt was still dressed bizarrely in baggy clothing. Pt continues to have many delusions and ideas of reference. The delusions are non-bizarre (best friends with Lorin Show because they are both from [place] and have the same birthday, which are in fact 30 years and one day apart.) Pt is pleasant and agreeable but still pressured. Broke out into song at several points during the interview.  On collateral call with mother, Stacy Poole [phone]): Visited daughter yesterday evening. Spent 15 minutes. Definitely delusional but not agitated. Somewhat redirectable. More coherent. Very specific in her description of prolixin  side effects. Beginning to recognize that she is delusional. Asked to go to the apartment to take out her trash. THC infused gummies on the table -- 1500mg  in one  packet, empty. Believe she took the whole thing. Was hallucinating. Will discuss it with her when stable.  Past Psychiatric History: Current psychiatrist: Dr. Sable Current therapist: Unclear Previous psychiatric diagnoses: schizophrenia, schizoaffective disorder, bipolar type, bipolar disorder, schizophrenia, stimulant-induced psychosis, alcohol  use disorder in remission, cannabis use disorder, PTSD.  Current psychiatric medications: Vraylar  3.0 mg at bedtime, thorazine  100 mg at bedtime, doxazosin  1 mg at bedtime, mirtazapine  7.5 at bedtime, topamax  50 mg at bedtime. Psychiatric medication history/compliance: Recently on lexapro  20 mg but this medication was discontinued as patient attempted to overdose on it. Has been on Zyprexa  for a while per mom, but this stopped being effective. Previously trialed clozapine and risperdal  to no effect. Lamictal  and abilify.  Psychiatric hospitalization(s): This is her 21st hospitalization. Recently at [facility] in [place] for two weeks, discharged ~1 week ago. Has had 12+ hospitalizations over the last decade per chart review, for similar symptomatology. Psychotherapy history: Unclear Neuromodulation history:  History of suicide (obtained from HPI): Attempted OD on lexapro  04/2024, has told others that it was an attempt to end her life History of homicide or aggression (obtained in HPI):    Substance Abuse History: Alcohol : Previous serious alcohol  use history, ETOH negative on admission, alcohol  withdrawal seizures Tobacco: Occasional previous use Cannabis: THC positive IV drug use: None Prescription drug use: None Other illicit drugs: None Rehab history: None   Past Medical History: PCP: None Medical diagnoses: Abnormal vaginal bleeding, diagnosed 09/2023 Medications: Tylenol  Allergies: Abilify (acne), carbamazepine  (suicidal?), Lamotrigine  (RASH), tetracycline (pseudotumor cerebri), pork products Hospitalizations: None Surgeries: None  known  Trauma: 18 concussions from soccer Seizures:    LMP: 10 days ago Contraceptives: None known   Social History:  Living situation: Alone Education: Nearly finished college 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 Yes.    Has the patient been a risk to self in the past 6 months? Yes.    Has the patient been a risk to self within the distant past? Yes.     Is the patient a risk to others? No.  Has the patient been a risk to others in the past 6 months? Yes.    Has the patient been a risk to others within the distant past? Yes.     Current Medications: Current Facility-Administered Medications  Medication Dose Route Frequency Provider Last Rate Last Admin   acetaminophen  (TYLENOL ) tablet 650 mg  650 mg Oral Q6H PRN Motley-Mangrum, Jadeka A, PMHNP   650 mg at 05/16/24 1833   alum & mag hydroxide-simeth (MAALOX/MYLANTA) 200-200-20 MG/5ML suspension 30 mL  30 mL Oral Q4H PRN Motley-Mangrum, Jadeka A, PMHNP       benztropine  (COGENTIN ) tablet 0.5 mg  0.5 mg Oral BID Rollene Katz, MD   0.5 mg at 05/17/24 9263   cariprazine  (VRAYLAR ) capsule 4.5 mg  4.5 mg Oral QPM Rollene Katz, MD   4.5 mg at 05/16/24 1810   chlorproMAZINE  (THORAZINE ) tablet 100 mg  100 mg Oral QHS Crawford, Benjamin, MD   100 mg at 05/16/24 2051   haloperidol  (HALDOL ) tablet 5 mg  5 mg Oral TID PRN Motley-Mangrum, Jadeka A, PMHNP   5 mg at 05/16/24 0126   And   diphenhydrAMINE  (BENADRYL ) capsule 50 mg  50 mg Oral TID PRN Motley-Mangrum, Jadeka A, PMHNP   50 mg at 05/16/24 0126   haloperidol  lactate (HALDOL ) injection 5 mg  5 mg Intramuscular TID PRN Motley-Mangrum, Jadeka A, PMHNP       And    diphenhydrAMINE  (BENADRYL ) injection 50 mg  50 mg Intramuscular TID PRN Motley-Mangrum, Jadeka A, PMHNP       And   LORazepam  (ATIVAN ) injection 2 mg  2 mg Intramuscular TID PRN Motley-Mangrum, Jadeka A, PMHNP       haloperidol  lactate (HALDOL ) injection 10 mg  10 mg Intramuscular TID PRN Motley-Mangrum, Jadeka A, PMHNP   10 mg at 05/14/24 1044   And   diphenhydrAMINE  (BENADRYL ) injection 50 mg  50 mg Intramuscular TID PRN Motley-Mangrum, Jadeka A, PMHNP   50 mg at 05/14/24 1044   And   LORazepam  (ATIVAN ) injection 2 mg  2 mg Intramuscular TID PRN Motley-Mangrum, Jadeka A, PMHNP   2 mg at 05/14/24 1043   doxazosin  (CARDURA ) tablet 1 mg  1 mg Oral QHS Rollene Katz, MD   1 mg at 05/16/24 2051   magnesium  hydroxide (MILK OF MAGNESIA) suspension 30 mL  30 mL Oral Daily PRN Motley-Mangrum, Jadeka A, PMHNP   30 mL at 05/16/24 1528   mirtazapine  (REMERON ) tablet 7.5 mg  7.5 mg Oral QHS Motley-Mangrum, Jadeka A, PMHNP   7.5 mg at 05/16/24 2051   topiramate  (TOPAMAX ) tablet 50 mg  50 mg Oral BID Motley-Mangrum, Jadeka A, PMHNP   50 mg at 05/17/24 0736   traZODone  (DESYREL ) tablet 50 mg  50 mg Oral QHS PRN Trudy Carwin, NP   50 mg at 05/15/24 2045  Lab Results:  No results found for this or any previous visit (from the past 48 hours).   Blood Alcohol  level:  Lab Results  Component Value Date   [facility] <15 05/13/2024   ETH <15 04/21/2024    Metabolic Labs: Lab Results  Component Value Date   HGBA1C 4.9 05/15/2024   MPG 93.93 05/15/2024   MPG 91.06 09/02/2021   Lab Results  Component Value Date   PROLACTIN 11.5 09/21/2023   Lab Results  Component Value Date   CHOL 151 05/15/2024   TRIG 56 05/15/2024   HDL 60 05/15/2024   CHOLHDL 2.5 05/15/2024   VLDL 11 05/15/2024   LDLCALC 80 05/15/2024   LDLCALC 78 09/02/2021    Physical Findings: AIMS: No  CIWA:    COWS:     Psychiatric Specialty Exam:  Presentation   General Appearance: Bizarre (dressed like princess  leia)  Eye Contact: Good  Speech: Clear and Coherent  Speech Volume: Normal  Handedness: No data recorded  Mood and Affect   Mood: -- (Bright)  Affect: Congruent   Thinking   Thought Processes: Linear  Descriptions of Associations: Tangential  Orientation: Full (Time, Place and Person)  Thought Content: Tangential; Perseveration; Paranoid Ideation; Delusions; Illogical  History of Schizophrenia/Schizoaffective disorder: No   Duration of Psychotic Symptoms: >1 month  Hallucinations: None  Ideas of Reference: Delusions  Suicidal Thoughts: No  Homicidal Thoughts: No   Sensorium    Memory: Immediate Fair  Judgment: Impaired  Insight: Shallow   Executive Functions    Concentration: Poor  Attention Span: Fair  Recall: [employer] of Knowledge: Fair  Language: Fair   Psychomotor Activity: Normal    Assets: No data recorded   Sleep: Poor 4.75    Physical Exam Vitals and nursing note reviewed.  Pulmonary:     Effort: Pulmonary effort is normal.  Neurological:     Mental Status: She is alert and oriented to person, place, and time.    Review of Systems  Constitutional:  Negative for fever.  Respiratory:  Negative for shortness of breath.   Cardiovascular:  Negative for chest pain.  Gastrointestinal:  Negative for abdominal pain, constipation and diarrhea.  Psychiatric/Behavioral:  Negative for depression, hallucinations, substance abuse and suicidal ideas. The patient does not have insomnia.    Blood pressure (!) 104/49, pulse 100, temperature 98.3 F (36.8 C), temperature source Oral, resp. rate 20, height 5' 4 (1.626 m), weight 65.9 kg, last menstrual period 04/07/2024, SpO2 99%. Body mass index is 24.92 kg/m.   Treatment Plan Summary: Daily contact with patient to assess and evaluate symptoms and progress in treatment and Medication management     ASSESSMENT:    Jenan Ellegood is a 33 year old female who presents in an  acute mixed episode (Bipolar 1 disorder, current episode mixed vs schizoaffective disorder, bipolar type, current episode mixed). Currently presents with multiple somatic symptoms but is decompensating, delusional, almost word-salad. Very paranoid and grandiose -- appears extraordinarily mentally ill. Has needed IM agitation medications twice in two days. Last month it appears she swallowed the entirety of her Lexapro , leading to previous hospitalization. Extreme threat to self, possibly others considering irritability and extent of delusions. This is one of many admissions for her, with increasing frequency this year. Will push as hard as possible for long-acting injectible formulation and/or ACT team. Only way patient has a chance of maintaining independent living is with these measures -- patient understandably desires this independence. Will have this discussion after  she regains insight. Expect longer hospital stay will be necessary for this patient. Will start Vraylar  4.5 mg at bedtime as mother reports good response to this medication and substitute prolixin  for thorazine  with eye towards LAI. Should we not see improvement this week, will consider switching from Vraylar  to Risperdal . Currently voluntary, will initiate IVC proceedings should patient sign 72 hour form.   Patient is disorganized and in florid, if pleasant, psychosis.  Has shallow insight into her schizophrenia (I had it, but Dr Sable cleared it) and is motivated to get better. Patient requested thorazine  in place of prolixin , which is acceptable. Still believe she would improve with an LAI. I am fine with discharging her on dual PO antipsychotic therapy but believe she will need an ACT team in lieu of LAI. Mom believes 1500 mg cannabis gummy may have kicked things off. Will discuss this with her when she is stabilized.    Diagnoses / Active Problems: Bipolar I disorder, current episode mixed vs schizoaffective disorder, bipolar type,  current episode mixed Cannabis use disorder   PLAN:   Safety and Monitoring: -  VOLUNTARY  admission to inpatient psychiatric unit for safety, stabilization and treatment. - Daily contact with patient to assess and evaluate symptoms and progress in treatment - Patient's case to be discussed in multi-disciplinary team meeting -  Observation Level : q15 minute checks -  Vital signs:  q12 hours -  Precautions: suicide, elopement, and assault   2. Psychiatric Diagnoses and Treatment:     # Bipolar I disorder, in manic episode # Cannabis use disorder - Continue vraylar  4.5 mg at bedtime for mania/psychosis. - Continue Thorazine  100 mg at bedtime for psychosis.  - Continue Cogentin  0.5 mg BID for EPS prophylaxis - Will discuss with patient need to immediately discontinue all cannabis product use when she has greater insight.  - The risks/benefits/side-effects/alternatives to this medication were discussed in detail with the patient and time was given for questions. The patient consents to medication trial.  - Metabolic profile and EKG monitoring obtained while on an atypical antipsychotic  BMI: 24.92 TSH: Ordered, awaiting Lipid panel: Ordered, awaiting HbgA1c: Ordered, awaiting QTc: 439 - Encouraged patient to participate in unit milieu and in scheduled group therapies  - Short Term Goals: Ability to identify changes in lifestyle to reduce recurrence of condition will improve, Ability to verbalize feelings will improve, and Ability to disclose and discuss suicidal ideas - Long Term Goals: Improvement in symptoms so as ready for discharge   Other PRNS: Agitation, anxiety, sleep, GI complaints   Other labs reviewed on admission: + THC               3. Medical Issues Being Addressed:    # Abnormal vaginal bleeding - Workup 09/2023, will need follow-up with GYN, pap negative   4. Discharge Planning:    - Estimated discharge date: 7-8 days - Social work and case management to assist  with discharge planning and identification of hospital follow-up needs prior to discharge. - Discharge concerns: Need to establish a safety plan; medication compliance and effectiveness. - Discharge goals: Return home with outpatient referrals for mental health follow-up including medication management/psychotherapy.   I certify that inpatient services furnished can reasonably be expected to improve the patient's condition.     Signed: JINNY Morene GORMAN Delsie, MD [facility] Physician, PGY-2 05/17/2024 11:29 AM

## 2024-05-17 NOTE — Progress Notes (Addendum)
 Patient pacing the halls and becoming verbally aggressive with staff. Patient intrusive with peers and was redirected when trying to enter another peer's room. Patient becoming increasingly agitated. PO agitation protocol administered, per MAR. No distress noted. Safety checks continue. Patient remains safe at this time.  Edited to add: Patient continues to be agitated. Yelling in hallway, Y'all don't know about my TBI. Haldol  makes my brain injury worse. Patient also yelled, What the fuck is wrong with that whore in response to medication administration. Education and verbal redirection provided by staff.

## 2024-05-18 DIAGNOSIS — F312 Bipolar disorder, current episode manic severe with psychotic features: Secondary | ICD-10-CM | POA: Diagnosis not present

## 2024-05-18 MED ORDER — WHITE PETROLATUM EX OINT
TOPICAL_OINTMENT | CUTANEOUS | Status: AC
  Filled 2024-05-18: qty 5

## 2024-05-18 MED ORDER — CHLORPROMAZINE HCL 50 MG PO TABS
50.0000 mg | ORAL_TABLET | Freq: Every day | ORAL | Status: DC
  Administered 2024-05-18 – 2024-05-26 (×9): 50 mg via ORAL
  Filled 2024-05-18: qty 35
  Filled 2024-05-18 (×5): qty 2
  Filled 2024-05-18: qty 28
  Filled 2024-05-18 (×2): qty 2

## 2024-05-18 MED ORDER — LORAZEPAM 1 MG PO TABS
1.0000 mg | ORAL_TABLET | Freq: Once | ORAL | Status: AC
  Administered 2024-05-18: 1 mg via ORAL
  Filled 2024-05-18: qty 1

## 2024-05-18 NOTE — Plan of Care (Signed)
  Problem: Education: Goal: Emotional status will improve Outcome: Not Progressing Goal: Mental status will improve Outcome: Not Progressing   Problem: Activity: Goal: Interest or engagement in activities will improve Outcome: Not Progressing   Problem: Coping: Goal: Ability to verbalize frustrations and anger appropriately will improve Outcome: Not Progressing Goal: Ability to demonstrate self-control will improve Outcome: Not Progressing

## 2024-05-18 NOTE — Group Note (Signed)
 Date:  05/18/2024 Time:  8:26 PM  Group Topic/Focus:  Wrap-Up Group:   The focus of this group is to help patients review their daily goal of treatment and discuss progress on daily workbooks.    Participation Level:  Active  Participation Quality:  Appropriate  Affect:  Appropriate  Cognitive:  Oriented  Insight: Appropriate  Engagement in Group:  Engaged  Modes of Intervention:  Education and Exploration  Additional Comments:  Patient attended and participated in group tonight.  She reports that she did not remember her goal for today due to her mental illness.  Gwenn Chillington Dacosta 05/18/2024, 8:26 PM

## 2024-05-18 NOTE — Plan of Care (Signed)

## 2024-05-18 NOTE — Progress Notes (Signed)
   05/18/24 0912  Psych Admission Type (Psych Patients Only)  Admission Status Voluntary  Psychosocial Assessment  Patient Complaints Anxiety;Restlessness;Hyperactivity  Eye Contact Intense;Darting  Facial Expression Anxious;Wide-eyed  Affect Anxious;Euphoric;Labile  Psychologist, counselling;Attention-seeking  Motor Activity Restless;Hyperactive;Fidgety  Appearance/Hygiene Improved  Behavior Characteristics Fidgety;Intrusive;Impulsive;Anxious  Mood Anxious;Labile;Preoccupied  Thought Process  Coherency Circumstantial;Disorganized;Flight of ideas;Loose associations  Content Blaming others;Confabulation;Delusions;Preoccupation;Obsessions  Delusions Grandeur  Perception Illusions  Hallucination None reported or observed  Judgment Poor  Confusion Mild  Danger to Self  Current suicidal ideation? Denies  Agreement Not to Harm Self Yes  Description of Agreement Verbal  Danger to Others  Danger to Others None reported or observed

## 2024-05-18 NOTE — Progress Notes (Addendum)
(  Sleep Hours) -4.25 (Any PRNs that were needed, meds refused, or side effects to meds)-  haldol  5mg , trazodone  50mg , and ativan  1 mg (Any disturbances and when (visitation, over night)- verbally aggressive towards writer and given haldol  5mg , came back later at 0056 c/o insomnia given trazodone  50mg  and ativan  1mg  (Concerns raised by the patient)- none (SI/HI/AVH)- denies all

## 2024-05-18 NOTE — Progress Notes (Signed)
 [facility] MD Progress Note  05/18/2024 1:45 PM Stacy Poole  MRN:  [id number]  Principal Problem: Severe manic bipolar 1 disorder with psychotic behavior (HCC) Diagnosis: Principal Problem:   Severe manic bipolar 1 disorder with psychotic behavior (HCC)   Reason for Admission:  Stacy Poole is a 33 y.o. female  with a past psychiatric history of schizophrenia, schizoaffective disorder, bipolar type, bipolar disorder, schizophrenia, stimulant-induced psychosis, alcohol  use disorder in remission, cannabis use disorder, PTSD. Patient initially arrived to [facility] on 9/9 for manic episode with significant psychosis and paranoia, and admitted to [facility] on 9/9 for crisis stabilization. Of note, was recently discharged from [facility] and required IM Geodon  20 mg for severe agitation and irritability. PMHx is significant for abnormal uterine bleeding.  (admitted on 05/13/2024, total  LOS: 5 days )  Overnight events: VSS. PO Haldol  5 mg x2, benadryl  50 mg x1.  Ativan  1 mg x2. Trazodone  50 mg x1. Slept 4.25 hours. Vebally aggressive toward staff: I'm fucked, I can't sleep, give me everything and anything I can have. NAE. Denies SI, HI, AH, VH.    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 Asked after Thorazine  in the AM as well as the PM. Needed agitation protocol last night because she couldn't sleep. Said that she wasn't trying to be rude. Is OK with haldol  as needed for agitation but does not want it scheduled. Endorsed some blurry vision which she says is long-standing. Asked if she was about midway through her hospital stay, informed patient that discharge planning is a day-by-day process and will depend on patient improvement. 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 Denied  medication side-effects and other somatic symptoms. Denied SI and HI. Denied AH and VH.  Past Psychiatric History: Current psychiatrist: Dr. Sable Current therapist: Unclear Previous psychiatric diagnoses: schizophrenia, schizoaffective disorder, bipolar type, bipolar disorder, schizophrenia, stimulant-induced psychosis, alcohol  use disorder in remission, cannabis use disorder, PTSD.  Current psychiatric medications: Vraylar  3.0 mg at bedtime, thorazine  100 mg at bedtime, doxazosin  1 mg at bedtime, mirtazapine  7.5 at bedtime, topamax  50 mg at bedtime. Psychiatric medication history/compliance: Recently on lexapro  20 mg but this medication was discontinued as patient attempted to overdose on it. Has been on Zyprexa  for a while per mom, but this stopped being effective. Previously trialed clozapine and risperdal  to no effect. Lamictal  and abilify.  Psychiatric hospitalization(s): This is her 21st hospitalization. Recently at [facility] in [place] for two weeks, discharged ~1 week ago. Has had 12+ hospitalizations over the last decade per chart review, for similar symptomatology. Psychotherapy history: Unclear Neuromodulation history:  History of suicide (obtained from HPI): Attempted OD on lexapro  04/2024, has told others that it was an attempt to end her life History of homicide or aggression (obtained in HPI):    Substance Abuse History: Alcohol : Previous serious alcohol  use history, ETOH negative on admission, alcohol  withdrawal seizures Tobacco: Occasional previous use Cannabis: THC positive IV drug use: None Prescription drug use: None Other illicit drugs: None Rehab history: None   Past Medical History: PCP: None Medical diagnoses: Abnormal vaginal bleeding, diagnosed 09/2023 Medications: Tylenol  Allergies: Abilify (acne), carbamazepine  (suicidal?), Lamotrigine  (RASH), tetracycline (pseudotumor cerebri), pork products Hospitalizations: None Surgeries: None known  Trauma: 17  concussions from soccer Seizures:    LMP: 10  days ago Contraceptives: None known   Social History: Living situation: Alone Education: Nearly finished college 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 Yes.    Has the patient been a risk to self in the past 6 months? Yes.    Has the patient been a risk to self within the distant past? Yes.     Is the patient a risk to others? No.  Has the patient been a risk to others in the past 6 months? Yes.    Has the patient been a risk to others within the distant past? Yes.     Current Medications: Current Facility-Administered Medications  Medication Dose Route Frequency Provider Last Rate Last Admin   acetaminophen  (TYLENOL ) tablet 650 mg  650 mg Oral Q6H PRN Motley-Mangrum, Jadeka A, PMHNP   650 mg at 05/16/24 1833   alum & mag hydroxide-simeth (MAALOX/MYLANTA) 200-200-20 MG/5ML suspension 30 mL  30 mL Oral Q4H PRN Motley-Mangrum, Jadeka A, PMHNP       benztropine  (COGENTIN ) tablet 0.5 mg  0.5 mg Oral BID Rollene Katz, MD   0.5 mg at 05/18/24 9258   cariprazine  (VRAYLAR ) capsule 4.5 mg  4.5 mg Oral QPM Rollene Katz, MD   4.5 mg at 05/17/24 8161   chlorproMAZINE  (THORAZINE ) tablet 100 mg  100 mg Oral QHS Rio Kidane, MD   100 mg at 05/17/24 2048   chlorproMAZINE  (THORAZINE ) tablet 50 mg  50 mg Oral Daily Rollene Katz, MD   50 mg at 05/18/24 1110   haloperidol  (HALDOL ) tablet 5 mg  5 mg Oral TID PRN Motley-Mangrum, Jadeka A, PMHNP   5 mg at 05/18/24 0848   And   diphenhydrAMINE  (BENADRYL ) capsule 50 mg  50 mg Oral TID PRN Motley-Mangrum, Jadeka A, PMHNP   50 mg at 05/18/24 0848   haloperidol  lactate (HALDOL )  injection 5 mg  5 mg Intramuscular TID PRN Motley-Mangrum, Jadeka A, PMHNP       And   diphenhydrAMINE  (BENADRYL ) injection 50 mg  50 mg Intramuscular TID PRN Motley-Mangrum, Jadeka A, PMHNP       And   LORazepam  (ATIVAN ) injection 2 mg  2 mg Intramuscular TID PRN Motley-Mangrum, Jadeka A, PMHNP       haloperidol  lactate (HALDOL ) injection 10 mg  10 mg Intramuscular TID PRN Motley-Mangrum, Jadeka A, PMHNP   10 mg at 05/14/24 1044   And   diphenhydrAMINE  (BENADRYL ) injection 50 mg  50 mg Intramuscular TID PRN Motley-Mangrum, Jadeka A, PMHNP   50 mg at 05/14/24 1044   And   LORazepam  (ATIVAN ) injection 2 mg  2 mg Intramuscular TID PRN Motley-Mangrum, Jadeka A, PMHNP   2 mg at 05/14/24 1043   doxazosin  (CARDURA ) tablet 1 mg  1 mg Oral QHS Rollene Katz, MD   1 mg at 05/17/24 2048   LORazepam  (ATIVAN ) tablet 1 mg  1 mg Oral TID PRN Delsie Lynwood Katz Lavone, MD   1 mg at 05/18/24 0056   magnesium  hydroxide (MILK OF MAGNESIA) suspension 30 mL  30 mL Oral Daily PRN Motley-Mangrum, Jadeka A, PMHNP   30 mL at 05/16/24 1528   mirtazapine  (REMERON ) tablet 7.5 mg  7.5 mg Oral QHS Motley-Mangrum, Jadeka  A, PMHNP   7.5 mg at 05/17/24 2048   topiramate  (TOPAMAX ) tablet 50 mg  50 mg Oral BID Motley-Mangrum, Jadeka A, PMHNP   50 mg at 05/18/24 0741   traZODone  (DESYREL ) tablet 50 mg  50 mg Oral QHS PRN Trudy Carwin, NP   50 mg at 05/18/24 0056   white petrolatum  (VASELINE) gel             Lab Results:  No results found for this or any previous visit (from the past 48 hours).   Blood Alcohol  level:  Lab Results  Component Value Date   [facility] <15 05/13/2024   ETH <15 04/21/2024    Metabolic Labs: Lab Results  Component Value Date   HGBA1C 4.9 05/15/2024   MPG 93.93 05/15/2024   MPG 91.06 09/02/2021   Lab Results  Component Value Date   PROLACTIN 11.5 09/21/2023   Lab Results  Component Value Date   CHOL 151 05/15/2024   TRIG 56 05/15/2024   HDL 60 05/15/2024   CHOLHDL 2.5  05/15/2024   VLDL 11 05/15/2024   LDLCALC 80 05/15/2024   LDLCALC 78 09/02/2021    Physical Findings: AIMS: No  CIWA:    COWS:     Psychiatric Specialty Exam:  Presentation   General Appearance: Bizarre (Well-appearing, standing in hallway, wearing many layers of clothing inappropriately)  Eye Contact: -- (intense)  Speech: -- (pushed)  Speech Volume: Normal  Handedness: Right   Mood and Affect   Mood: Euphoric  Affect: Congruent   Thinking   Thought Processes: Disorganized; Irrevelant  Descriptions of Associations: Loose  Orientation: Full (Time, Place and Person)  Thought Content: Logical  History of Schizophrenia/Schizoaffective disorder: No   Duration of Psychotic Symptoms: >1 month  Hallucinations: None  Ideas of Reference: Delusions  Suicidal Thoughts: No  Homicidal Thoughts: No   Sensorium    Memory: Immediate Fair  Judgment: Impaired  Insight: Shallow   Executive Functions    Concentration: Poor  Attention Span: Fair  Recall: [employer] of Knowledge: Fair  Language: Fair   Psychomotor Activity: Normal    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  Pulmonary:     Effort: Pulmonary effort is normal.  Neurological:     Mental Status: She is alert and oriented to person, place, and time.    Review of Systems  Constitutional:  Negative for fever.  Respiratory:  Negative for shortness of breath.   Cardiovascular:  Negative for chest pain.  Gastrointestinal:  Negative for abdominal pain, constipation and diarrhea.  Psychiatric/Behavioral:  Negative for depression, hallucinations, substance abuse and suicidal ideas. The patient does not have insomnia.    Blood pressure 113/81, pulse 67, temperature (!) 97.3 F (36.3 C), temperature source Oral, resp. rate 20, height 5' 4 (1.626 m), weight 65.9 kg, last menstrual period 04/07/2024,  SpO2 100%. Body mass index is 24.92 kg/m.   Treatment Plan Summary: Daily contact with patient to assess and evaluate symptoms and progress in treatment and Medication management     ASSESSMENT:    Aly Seidenberg is a 33 year old female who presents in an acute mixed episode (Bipolar 1 disorder, current episode mixed vs schizoaffective disorder, bipolar type, current episode mixed). Currently presents with multiple somatic symptoms but is decompensating, delusional, almost word-salad. Very paranoid and grandiose -- appears extraordinarily mentally ill. Has needed IM agitation medications twice in two days. Last month it  appears she swallowed the entirety of her Lexapro , leading to previous hospitalization. Extreme threat to self, possibly others considering irritability and extent of delusions. This is one of many admissions for her, with increasing frequency this year. Will push as hard as possible for long-acting injectible formulation and/or ACT team. Only way patient has a chance of maintaining independent living is with these measures -- patient understandably desires this independence. Will have this discussion after she regains insight. Expect longer hospital stay will be necessary for this patient. Will start Vraylar  4.5 mg at bedtime as mother reports good response to this medication and substitute prolixin  for thorazine  with eye towards LAI. Should we not see improvement this week, will consider switching from Vraylar  to Risperdal . Currently voluntary, will initiate IVC proceedings should patient sign 72 hour form.   Patient affect improved somewhat but still intrusive, rapid speech, elation closer to mania than the mixed presentation in which she arrived. Slept 4.5 hours last night with some aggression toward staff. With patient in agreement, will increase thorazine  to 50 mg qAM and 100 mg qPM and then likely titrate the Vraylar  to 6 mg to target manic appearance tomorrow. If this does not  work, Vraylar  --> Risperdal  --> Invega will be  the plan. Still is not linear enough to benefit from conversation about cannabis. Will discuss ACT team tomorrow with patient.     Diagnoses / Active Problems: Bipolar I disorder, current episode mixed vs schizoaffective disorder, bipolar type, current episode mixed Cannabis use disorder   PLAN:   Safety and Monitoring: -  VOLUNTARY  admission to inpatient psychiatric unit for safety, stabilization and treatment. - Daily contact with patient to assess and evaluate symptoms and progress in treatment - Patient's case to be discussed in multi-disciplinary team meeting -  Observation Level : q15 minute checks -  Vital signs:  q12 hours -  Precautions: suicide, elopement, and assault   2. Psychiatric Diagnoses and Treatment:     # Bipolar I disorder, in manic episode # Cannabis use disorder - Continue vraylar  4.5 mg at bedtime for mania/psychosis. - Increase Thorazine  100mg  at bedtime -->  50 mg qAM + 100 mg at bedtime for psychosis.  - Continue Cogentin  0.5 mg BID for EPS prophylaxis - Continue Ativan  1 mg TID PRN for agitiation.  - Will discuss with patient need to immediately discontinue all cannabis product use when she has greater insight.  - The risks/benefits/side-effects/alternatives to this medication were discussed in detail with the patient and time was given for questions. The patient consents to medication trial.  - Metabolic profile and EKG monitoring obtained while on an atypical antipsychotic  BMI: 24.92 TSH: Ordered, awaiting Lipid panel: Ordered, awaiting HbgA1c: Ordered, awaiting QTc: 439 - Encouraged patient to participate in unit milieu and in scheduled group therapies  - Short Term Goals: Ability to identify changes in lifestyle to reduce recurrence of condition will improve, Ability to verbalize feelings will improve, and Ability to disclose and discuss suicidal ideas - Long Term Goals: Improvement in symptoms so as  ready for discharge   Other PRNS: Agitation, anxiety, sleep, GI complaints   Other labs reviewed on admission: + THC               3. Medical Issues Being Addressed:    # Abnormal vaginal bleeding - Workup 09/2023, will need follow-up with GYN, pap negative   4. Discharge Planning:    - Estimated discharge date: 6-8 days - Social work and case management  to assist with discharge planning and identification of hospital follow-up needs prior to discharge. - Discharge concerns: Need to establish a safety plan; medication compliance and effectiveness. - Discharge goals: Return home with outpatient referrals for mental health follow-up including medication management/psychotherapy.   I certify that inpatient services furnished can reasonably be expected to improve the patient's condition.     Signed: Odis Cleveland, MD [facility] Physician, PGY-2 05/18/2024 1:45 PM

## 2024-05-18 NOTE — Progress Notes (Signed)
   05/18/24 2119  Psych Admission Type (Psych Patients Only)  Admission Status Voluntary  Psychosocial Assessment  Patient Complaints Restlessness;Irritability  Eye Contact Fair  Facial Expression Anxious  Affect Appropriate to circumstance;Labile  Speech Pressured  Interaction Assertive;Intrusive  Motor Activity Fidgety  Appearance/Hygiene Layered clothes  Behavior Characteristics Agressive verbally;Anxious  Mood Preoccupied;Labile  Thought Chartered certified accountant of ideas;Tangential  Content Preoccupation  Delusions Grandeur  Perception Derealization  Hallucination None reported or observed  Judgment Impaired  Confusion Mild  Danger to Self  Current suicidal ideation? Denies

## 2024-05-18 NOTE — [facility] Group Notes (Signed)
 Adult Psychoeducational Group Note  Date:  05/18/2024 Time:  7:52 PM  Group Topic/Focus:  Goals Group:   The focus of this group is to help patients establish daily goals to achieve during treatment and discuss how the patient can incorporate goal setting into their daily lives to aide in recovery. Orientation:   The focus of this group is to educate the patient on the purpose and policies of crisis stabilization and provide a format to answer questions about their admission.  The group details unit policies and expectations of patients while admitted.  Participation Level:  Active  Participation Quality:  Monopolizing  Affect:  Not Congruent  Cognitive:  Disorganized  Insight: None  Engagement in Group:  Distracting  Modes of Intervention:  Discussion  Additional Comments:  Pt attended the goals group and remained active, but disorganized throughout the duration of the group.   Janai Maudlin O 05/18/2024, 7:52 PM

## 2024-05-19 ENCOUNTER — Encounter (HOSPITAL_COMMUNITY): Payer: Self-pay

## 2024-05-19 DIAGNOSIS — F312 Bipolar disorder, current episode manic severe with psychotic features: Secondary | ICD-10-CM | POA: Diagnosis not present

## 2024-05-19 MED ORDER — CARIPRAZINE HCL 3 MG PO CAPS
6.0000 mg | ORAL_CAPSULE | Freq: Every evening | ORAL | Status: DC
  Administered 2024-05-19 – 2024-05-25 (×7): 6 mg via ORAL
  Filled 2024-05-19 (×2): qty 2
  Filled 2024-05-19: qty 14
  Filled 2024-05-19 (×9): qty 2

## 2024-05-19 NOTE — Group Note (Signed)
 LCSW Group Therapy Note   Group Date: 05/19/2024 Start Time: 1100 End Time: 1200   Participation:  did not attend  Type of Therapy:  Group Therapy  Topic:  Healing From Within: Understanding Our Past, Building Our Future"  Objective:  To help participants understand the impact of early experiences on mental and physical health, with a focus on Adverse Childhood Experiences (ACEs), and to explore ways to build resilience and healing.  Summary: In today's session, we discussed how early experiences, especially ACEs, impact mental and physical health.  We explored the effects of stress, abuse, and neglect on brain development and well-being. The group focused on resilience, understanding that healing and positive change are possible with support and self-awareness.  Group Goals: Understand ACEs and Their Impact: Learn how childhood experiences shape mental and physical health. Build Resilience: Develop strategies for overcoming challenges and creating positive change. Promote Healing: Recognize the value of support and the possibility of healing through therapy and self-care.  Therapeutic Modalities Used: Psychoeducation: Sharing information about ACEs and their effects. Cognitive Behavioral Therapy (CBT): Helping reframe negative thought patterns. Trauma-Informed Therapy: Creating a safe, supportive space for healing.   Tashawna Thom O Alexius Ellington, LCSWA 05/19/2024  4:14 PM

## 2024-05-19 NOTE — Progress Notes (Signed)
(  Sleep Hours) - (Any PRNs that were needed, meds refused, or side effects to meds)- ativan , trazodone   (Any disturbances and when (visitation, over night)- patient labile and intrusive with staff and other patients on the hall  (Concerns raised by the patient)- Pt stated she has always had an issue sleeping since she was little, Clinical research associate encouraged to talk to the doctor about something that can help with the dreams waking me up.   (SI/HI/AVH)- denies all

## 2024-05-19 NOTE — Progress Notes (Signed)
   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 Denies  Agreement Not to Harm Self Yes  Description of Agreement Verbal  Danger to Others  Danger to Others None reported or observed

## 2024-05-19 NOTE — BH IP Treatment Plan (Signed)
 Interdisciplinary Treatment and Diagnostic Plan Update  05/19/2024 Time of Session: 12:20 PM - UPDATE Stacy Poole MRN: [id number]  Principal Diagnosis: Severe manic bipolar 1 disorder with psychotic behavior (HCC)  Secondary Diagnoses: Principal Problem:   Severe manic bipolar 1 disorder with psychotic behavior (HCC)   Current Medications:  Current Facility-Administered Medications  Medication Dose Route Frequency Provider Last Rate Last Admin   acetaminophen  (TYLENOL ) tablet 650 mg  650 mg Oral Q6H PRN Motley-Mangrum, Jadeka A, PMHNP   650 mg at 05/16/24 1833   alum & mag hydroxide-simeth (MAALOX/MYLANTA) 200-200-20 MG/5ML suspension 30 mL  30 mL Oral Q4H PRN Motley-Mangrum, Jadeka A, PMHNP       benztropine  (COGENTIN ) tablet 0.5 mg  0.5 mg Oral BID Rollene Katz, MD   0.5 mg at 05/19/24 9176   cariprazine  (VRAYLAR ) capsule 6 mg  6 mg Oral QPM Rollene Katz, MD       chlorproMAZINE  (THORAZINE ) tablet 100 mg  100 mg Oral QHS Crawford, Benjamin, MD   100 mg at 05/18/24 2055   chlorproMAZINE  (THORAZINE ) tablet 50 mg  50 mg Oral Daily Rollene Katz, MD   50 mg at 05/19/24 9176   haloperidol  (HALDOL ) tablet 5 mg  5 mg Oral TID PRN Motley-Mangrum, Jadeka A, PMHNP   5 mg at 05/19/24 9078   And   diphenhydrAMINE  (BENADRYL ) capsule 50 mg  50 mg Oral TID PRN Motley-Mangrum, Jadeka A, PMHNP   50 mg at 05/19/24 9078   haloperidol  lactate (HALDOL ) injection 5 mg  5 mg Intramuscular TID PRN Motley-Mangrum, Jadeka A, PMHNP       And   diphenhydrAMINE  (BENADRYL ) injection 50 mg  50 mg Intramuscular TID PRN Motley-Mangrum, Jadeka A, PMHNP       And   LORazepam  (ATIVAN ) injection 2 mg  2 mg Intramuscular TID PRN Motley-Mangrum, Jadeka A, PMHNP       haloperidol  lactate (HALDOL ) injection 10 mg  10 mg Intramuscular TID PRN Motley-Mangrum, Jadeka A, PMHNP   10 mg at 05/14/24 1044   And   diphenhydrAMINE  (BENADRYL ) injection 50 mg  50 mg Intramuscular TID PRN Motley-Mangrum, Jadeka A,  PMHNP   50 mg at 05/14/24 1044   And   LORazepam  (ATIVAN ) injection 2 mg  2 mg Intramuscular TID PRN Motley-Mangrum, Jadeka A, PMHNP   2 mg at 05/14/24 1043   doxazosin  (CARDURA ) tablet 1 mg  1 mg Oral QHS Rollene Katz, MD   1 mg at 05/18/24 2055   LORazepam  (ATIVAN ) tablet 1 mg  1 mg Oral TID PRN Delsie Lynwood Katz Lavone, MD   1 mg at 05/19/24 1251   magnesium  hydroxide (MILK OF MAGNESIA) suspension 30 mL  30 mL Oral Daily PRN Motley-Mangrum, Jadeka A, PMHNP   30 mL at 05/16/24 1528   mirtazapine  (REMERON ) tablet 7.5 mg  7.5 mg Oral QHS Motley-Mangrum, Jadeka A, PMHNP   7.5 mg at 05/18/24 2055   topiramate  (TOPAMAX ) tablet 50 mg  50 mg Oral BID Motley-Mangrum, Jadeka A, PMHNP   50 mg at 05/19/24 9177   traZODone  (DESYREL ) tablet 50 mg  50 mg Oral QHS PRN Trudy Carwin, NP   50 mg at 05/18/24 2111   PTA Medications: Medications Prior to Admission  Medication Sig Dispense Refill Last Dose/Taking   Cariprazine  HCl (VRAYLAR ) 4.5 MG CAPS Take 4.5 mg by mouth at bedtime. (Patient not taking: Reported on 05/13/2024)      chlorproMAZINE  (THORAZINE ) 100 MG tablet Take 100 mg by mouth at bedtime. (Patient not  taking: Reported on 05/13/2024)      doxazosin  (CARDURA ) 1 MG tablet Take 1 mg by mouth at bedtime.      escitalopram  (LEXAPRO ) 20 MG tablet Take 20 mg by mouth at bedtime. (Patient not taking: Reported on 05/13/2024)      hydrOXYzine  (ATARAX ) 25 MG tablet Take 1 tablet (25 mg total) by mouth 3 (three) times daily as needed for anxiety. (Patient taking differently: Take 25 mg by mouth daily as needed for anxiety.) 90 tablet 0    mirtazapine  (REMERON ) 7.5 MG tablet Take 7.5 mg by mouth at bedtime.      topiramate  (TOPAMAX ) 50 MG tablet Take 50 mg by mouth in the morning and at bedtime.      TYLENOL  500 MG tablet Take 500 mg by mouth daily as needed for mild pain (pain score 1-3), moderate pain (pain score 4-6) or headache.      VRAYLAR  3 MG capsule Take 3 mg by mouth at bedtime.        Patient Stressors: Medication change or noncompliance   Substance abuse   Traumatic event    Patient Strengths: Physical Health  Special hobby/interest  Supportive family/friends   Treatment Modalities: Medication Management, Group therapy, Case management,  1 to 1 session with clinician, Psychoeducation, Recreational therapy.   Physician Treatment Plan for Primary Diagnosis: Severe manic bipolar 1 disorder with psychotic behavior (HCC) Long Term Goal(s):     Short Term Goals: Ability to identify changes in lifestyle to reduce recurrence of condition will improve Ability to verbalize feelings will improve Ability to disclose and discuss suicidal ideas  Medication Management: Evaluate patient's response, side effects, and tolerance of medication regimen.  Therapeutic Interventions: 1 to 1 sessions, Unit Group sessions and Medication administration.  Evaluation of Outcomes: Progressing  Physician Treatment Plan for Secondary Diagnosis: Principal Problem:   Severe manic bipolar 1 disorder with psychotic behavior (HCC)  Long Term Goal(s):     Short Term Goals: Ability to identify changes in lifestyle to reduce recurrence of condition will improve Ability to verbalize feelings will improve Ability to disclose and discuss suicidal ideas     Medication Management: Evaluate patient's response, side effects, and tolerance of medication regimen.  Therapeutic Interventions: 1 to 1 sessions, Unit Group sessions and Medication administration.  Evaluation of Outcomes: Progressing   RN Treatment Plan for Primary Diagnosis: Severe manic bipolar 1 disorder with psychotic behavior (HCC) Long Term Goal(s): Knowledge of disease and therapeutic regimen to maintain health will improve  Short Term Goals: Ability to remain free from injury will improve, Ability to verbalize frustration and anger appropriately will improve, Ability to verbalize feelings will improve, and Ability to disclose  and discuss suicidal ideas  Medication Management: RN will administer medications as ordered by provider, will assess and evaluate patient's response and provide education to patient for prescribed medication. RN will report any adverse and/or side effects to prescribing provider.  Therapeutic Interventions: 1 on 1 counseling sessions, Psychoeducation, Medication administration, Evaluate responses to treatment, Monitor vital signs and CBGs as ordered, Perform/monitor CIWA, COWS, AIMS and Fall Risk screenings as ordered, Perform wound care treatments as ordered.  Evaluation of Outcomes: Progressing   LCSW Treatment Plan for Primary Diagnosis: Severe manic bipolar 1 disorder with psychotic behavior (HCC) Long Term Goal(s): Safe transition to appropriate next level of care at discharge, Engage patient in therapeutic group addressing interpersonal concerns.  Short Term Goals: Engage patient in aftercare planning with referrals and resources, Increase ability to appropriately verbalize feelings,  Facilitate acceptance of mental health diagnosis and concerns, and Identify triggers associated with mental health/substance abuse issues  Therapeutic Interventions: Assess for all discharge needs, 1 to 1 time with Social worker, Explore available resources and support systems, Assess for adequacy in community support network, Educate family and significant other(s) on suicide prevention, Complete Psychosocial Assessment, Interpersonal group therapy.  Evaluation of Outcomes: Progressing   Progress in Treatment: Attending groups: attended some groups Participating in groups: Yes. Taking medication as prescribed: Yes. Toleration medication: N/A Family/Significant other contact made: No will contact:  Ivee Poellnitz (mother) [phone] Patient understands diagnosis: No. Discussing patient identified problems/goals with staff: No. Medical problems stabilized or resolved: Yes. Denies suicidal/homicidal  ideation: Yes. Issues/concerns per patient self-inventory: No.   New problem(s) identified:  No   New Short Term/Long Term Goal(s):     medication stabilization, elimination of SI thoughts, development of comprehensive mental wellness plan.      Patient Goals:  I have a headache and need medical attention. I have a good therapist.     Discharge Plan or Barriers:  Patient recently admitted. CSW will continue to follow and assess for appropriate referrals and possible discharge planning.      Reason for Continuation of Hospitalization: Medication stabilization  Psychosis   Estimated Length of Stay:  4 - 6 days  Last 3 [place] Suicide Severity Risk Score: Flowsheet Row [facility] (Current[facility] Most recent reading at 05/13/2024  4:00 PM ED from 05/13/2024 in [facility] Department at [facility] Most recent reading at 05/13/2024  8:24 AM ED from 04/21/2024 in [facility] Emergency Department at [facility] Most recent reading at 04/22/2024  5:46 AM  C-SSRS RISK CATEGORY No Risk No Risk No Risk    Last PHQ 2/9 Scores:    09/21/2023    9:29 AM 01/14/2022    4:27 AM 09/15/2021   11:44 PM  Depression screen PHQ 2/9  Decreased Interest 1 0 0  Down, Depressed, Hopeless 1 1 1   PHQ - 2 Score 2 1 1   Altered sleeping  0 3  Tired, decreased energy  0 3  Change in appetite  0 3  Feeling bad or failure about yourself   1 2  Trouble concentrating  0 2  Moving slowly or fidgety/restless  0 0  Suicidal thoughts  0 0  PHQ-9 Score  2 14  Difficult doing work/chores  Not difficult at all Very difficult    Scribe for Treatment Team: [facility] O Thurma Priego, LCSWA 05/19/2024 4:53 PM

## 2024-05-19 NOTE — Group Note (Signed)
 Recreation Therapy Group Note   Group Topic:Communication  Group Date: 05/19/2024 Start Time: 1025 End Time: 1053 Facilitators: Estefan Pattison-McCall, LRT,CTRS Location: 500 Hall Dayroom   Group Topic: Communication, Team Building, Problem Solving  Goal Area(s) Addresses:  Patient will effectively work with peer towards shared goal.  Patient will identify skills used to make activity successful.  Patient will identify how skills used during activity can be applied to reach post d/c goals.   Behavioral Response: Moderate  Intervention: STEM Activity- Glass blower/designer  Activity: Tallest [employer]. In teams of 5-6, patients were given 11 craft pipe cleaners. Using the materials provided, patients were instructed to compete again the opposing team(s) to build the tallest free-standing structure from floor level. The activity was timed; difficulty increased by Clinical research associate as Production designer, theatre/television/film continued.  Systematically resources were removed with additional directions for example, placing one arm behind their back, working in silence, and shape stipulations. LRT facilitated post-activity discussion reviewing team processes and necessary communication skills involved in completion. Patients were encouraged to reflect how the skills utilized, or not utilized, in this activity can be incorporated to positively impact support systems post discharge.  Education: Pharmacist, community, Scientist, physiological, Discharge Planning   Education Outcome: Acknowledges education/In group clarification offered/Needs additional education.    Affect/Mood: Labile   Participation Level: Moderate   Participation Quality: Independent   Behavior: Hyperverbal and Impulsive   Speech/Thought Process: Delusional   Insight: Limited   Judgement: Limited   Modes of Intervention: STEM Activity   Patient Response to Interventions:  Receptive   Education Outcome:  In group clarification offered    Clinical  Observations/Individualized Feedback: Pt was going back and forth about whether she could participate/complete activity. Pt had some interaction in helping put tower together. Pt did need redirection when she would veer off into topics that had nothing to do with the group. 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 Pt needed redirection during group as well.     Plan: Continue to engage patient in RT group sessions 2-3x/week.   Jeanmarc Viernes-McCall, LRT,CTRS 05/19/2024 1:01 PM

## 2024-05-19 NOTE — Progress Notes (Cosign Needed Addendum)
 [facility] MD Progress Note  05/19/2024 5:58 PM SADEE OSLAND  MRN:  [id number]  Principal Problem: Severe manic bipolar 1 disorder with psychotic behavior (HCC) Diagnosis: Principal Problem:   Severe manic bipolar 1 disorder with psychotic behavior (HCC)   Reason for Admission:  Stacy Poole is a 33 y.o. female  with a past psychiatric history of schizophrenia, schizoaffective disorder, bipolar type, bipolar disorder, schizophrenia, stimulant-induced psychosis, alcohol  use disorder in remission, cannabis use disorder, PTSD. Patient initially arrived to [name] on 9/9 for manic episode with significant psychosis and paranoia, and admitted to [facility] on 9/9 for crisis stabilization. Of note, was recently discharged from [facility] and required IM Geodon  20 mg for severe agitation and irritability. PMHx is significant for abnormal uterine bleeding.  (admitted on 05/13/2024, total  LOS: 6 days )  Overnight events: Tachy to 108 last night --> 73. VSS otherwise. Ativan  1 mg x1. Trazodone  50 mg x1. PO haldol  5 mg x1 and Benadryl  50 mg x1 ~@800  AM . Slept 5.25. Labile and intrusive with staff.   On interview: Patient is a little more irritable this morning. Is concerned about another, new female patient on staff calling her a sour pussy. Reported that this made her very uncomfortable and made her want to stay in her room. Encouraged patient to discuss this with a member of staff on the hall, whom she knows well, if she ever feels uncomfortable. Patient also has complaints of the night staff, who she believes yell at her Go back to bed, don't be in the Coy. Also brings up discharge more frequently today, says she has plans Friday. Amenable to increasing Vraylar  to 6 mg. Has reported more night terrors. Asked if she could be put on prazosin  for night mares. Discussed that she is already on Doxazosin , a similar medication. was told before that it's normal that I don't need much sleep, for someone  with my diagnosis. Endorses chronic blurry vision for 3 years, which continues in the hospital. Is relieved that the [place]  and the [place] have concluded hostage negotiations. Denies SI, HI, AVH.  Past Psychiatric History: Current psychiatrist: Dr. Sable Current therapist: Unclear Previous psychiatric diagnoses: schizophrenia, schizoaffective disorder, bipolar type, bipolar disorder, schizophrenia, stimulant-induced psychosis, alcohol  use disorder in remission, cannabis use disorder, PTSD.  Current psychiatric medications: Vraylar  3.0 mg at bedtime, thorazine  100 mg at bedtime, doxazosin  1 mg at bedtime, mirtazapine  7.5 at bedtime, topamax  50 mg at bedtime. Psychiatric medication history/compliance: Recently on lexapro  20 mg but this medication was discontinued as patient attempted to overdose on it. Has been on Zyprexa  for a while per mom, but this stopped being effective. Previously trialed clozapine and risperdal  to no effect. Lamictal  and abilify.  Psychiatric hospitalization(s): This is her 21st hospitalization. Recently at [facility] in [place] for two weeks, discharged ~1 week ago. Has had 12+ hospitalizations over the last decade per chart review, for similar symptomatology. Psychotherapy history: Unclear Neuromodulation history:  History of suicide (obtained from HPI): Attempted OD on lexapro  04/2024, has told others that it was an attempt to end her life History of homicide or aggression (obtained in HPI):    Substance Abuse History: Alcohol : Previous serious alcohol  use history, ETOH negative on admission, alcohol  withdrawal seizures Tobacco: Occasional previous use Cannabis: THC positive IV drug use: None Prescription drug use: None Other illicit drugs: None Rehab history: None   Past Medical History: PCP: None Medical diagnoses: Abnormal vaginal bleeding, diagnosed 09/2023 Medications: Tylenol  Allergies: Abilify (acne), carbamazepine  (suicidal?),  Lamotrigine  (RASH),  tetracycline (pseudotumor cerebri), pork products Hospitalizations: None Surgeries: None known  Trauma: 18 concussions from soccer Seizures:    LMP: 10 days ago Contraceptives: None known   Social History: Living situation: Alone Education: Nearly finished college 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 Yes.    Has the patient been a risk to self in the past 6 months? Yes.    Has the patient been a risk to self within the distant past? Yes.     Is the patient a risk to others? No.  Has the patient been a risk to others in the past 6 months? Yes.    Has the patient been a risk to others within the distant past? Yes.     Current Medications: Current Facility-Administered Medications  Medication Dose Route Frequency Provider Last Rate Last Admin   acetaminophen  (TYLENOL ) tablet 650 mg  650 mg Oral Q6H PRN Motley-Mangrum, Jadeka A, PMHNP   650 mg at 05/16/24 1833   alum & mag hydroxide-simeth (MAALOX/MYLANTA) 200-200-20 MG/5ML suspension 30 mL  30 mL Oral Q4H PRN Motley-Mangrum, Jadeka A, PMHNP       benztropine  (COGENTIN ) tablet 0.5 mg  0.5 mg Oral BID Rollene Katz, MD   0.5 mg at 05/19/24 1655   cariprazine  (VRAYLAR ) capsule 6 mg  6 mg Oral QPM Rollene Katz, MD       chlorproMAZINE  (THORAZINE ) tablet 100 mg  100 mg Oral QHS Ardelia Wrede, MD   100 mg at 05/18/24 2055   chlorproMAZINE  (THORAZINE ) tablet 50 mg  50 mg Oral Daily Rollene Katz, MD   50 mg at 05/19/24 9176   haloperidol  (HALDOL ) tablet 5 mg  5 mg Oral TID PRN Motley-Mangrum, Jadeka A, PMHNP   5 mg at 05/19/24 9078   And   diphenhydrAMINE  (BENADRYL ) capsule 50 mg  50 mg Oral TID PRN  Motley-Mangrum, Jadeka A, PMHNP   50 mg at 05/19/24 9078   haloperidol  lactate (HALDOL ) injection 5 mg  5 mg Intramuscular TID PRN Motley-Mangrum, Jadeka A, PMHNP       And   diphenhydrAMINE  (BENADRYL ) injection 50 mg  50 mg Intramuscular TID PRN Motley-Mangrum, Jadeka A, PMHNP       And   LORazepam  (ATIVAN ) injection 2 mg  2 mg Intramuscular TID PRN Motley-Mangrum, Jadeka A, PMHNP       haloperidol  lactate (HALDOL ) injection 10 mg  10 mg Intramuscular TID PRN Motley-Mangrum, Jadeka A, PMHNP   10 mg at 05/14/24 1044   And   diphenhydrAMINE  (BENADRYL ) injection 50 mg  50 mg Intramuscular TID PRN Motley-Mangrum, Jadeka A, PMHNP   50 mg at 05/14/24 1044   And   LORazepam  (ATIVAN ) injection 2 mg  2 mg Intramuscular TID PRN Motley-Mangrum, Jadeka A, PMHNP   2 mg at 05/14/24 1043   doxazosin  (CARDURA ) tablet 1 mg  1 mg Oral QHS Rollene Katz, MD   1 mg at 05/18/24 2055   LORazepam  (ATIVAN ) tablet 1 mg  1 mg Oral TID PRN Delsie Lynwood Katz Lavone, MD   1 mg at 05/19/24 1251   magnesium  hydroxide (MILK OF MAGNESIA) suspension 30 mL  30 mL Oral Daily PRN Motley-Mangrum, Jadeka A,  PMHNP   30 mL at 05/16/24 1528   mirtazapine  (REMERON ) tablet 7.5 mg  7.5 mg Oral QHS Motley-Mangrum, Jadeka A, PMHNP   7.5 mg at 05/18/24 2055   topiramate  (TOPAMAX ) tablet 50 mg  50 mg Oral BID Motley-Mangrum, Jadeka A, PMHNP   50 mg at 05/19/24 1655   traZODone  (DESYREL ) tablet 50 mg  50 mg Oral QHS PRN Trudy Carwin, NP   50 mg at 05/18/24 2111    Lab Results:  No results found for this or any previous visit (from the past 48 hours).   Blood Alcohol  level:  Lab Results  Component Value Date   [facility] <15 05/13/2024   ETH <15 04/21/2024    Metabolic Labs: Lab Results  Component Value Date   HGBA1C 4.9 05/15/2024   MPG 93.93 05/15/2024   MPG 91.06 09/02/2021   Lab Results  Component Value Date   PROLACTIN 11.5 09/21/2023   Lab Results  Component Value Date   CHOL 151 05/15/2024   TRIG 56  05/15/2024   HDL 60 05/15/2024   CHOLHDL 2.5 05/15/2024   VLDL 11 05/15/2024   LDLCALC 80 05/15/2024   LDLCALC 78 09/02/2021    Physical Findings: AIMS: No  CIWA:    COWS:     Psychiatric Specialty Exam:  Presentation   General Appearance: Bizarre  Eye Contact: -- (intense)  Speech: Pressured  Speech Volume: Normal  Handedness: Right   Mood and Affect   Mood: Euphoric; Euthymic  Affect: Congruent   Thinking   Thought Processes: Irrevelant (occasionally returns to subject at hand)  Descriptions of Associations: Loose  Orientation: Full (Time, Place and Person)  Thought Content: Illogical; Delusions (more logical today)  History of Schizophrenia/Schizoaffective disorder: Yes   Duration of Psychotic Symptoms: >1 month  Hallucinations: None  Ideas of Reference: Delusions  Suicidal Thoughts: No  Homicidal Thoughts: No   Sensorium    Memory: Immediate Fair  Judgment: Impaired  Insight: Present (improving)   Executive Functions    Concentration: Poor  Attention Span: Fair  Recall: [employer] of Knowledge: Fair  Language: Fair   Psychomotor Activity: Normal    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  Pulmonary:     Effort: Pulmonary effort is normal.  Neurological:     Mental Status: She is alert and oriented to person, place, and time.    Review of Systems  Constitutional:  Negative for fever.  Respiratory:  Negative for shortness of breath.   Cardiovascular:  Negative for chest pain.  Gastrointestinal:  Negative for abdominal pain, constipation and diarrhea.  Psychiatric/Behavioral:  Negative for depression, hallucinations, substance abuse and suicidal ideas. The patient does not have insomnia.    Blood pressure 111/76, pulse 73, temperature 97.7 F (36.5 C), temperature source Oral, resp. rate 20, height 5' 4 (1.626 m), weight 65.9  kg, last menstrual period 04/07/2024, SpO2 100%. Body mass index is 24.92 kg/m.   Treatment Plan Summary: Daily contact with patient to assess and evaluate symptoms and progress in treatment and Medication management     ASSESSMENT:    Stacy Poole is a 33 year old female who presents in an acute mixed episode (Bipolar 1 disorder, current episode mixed vs schizoaffective disorder, bipolar type, current episode mixed). Currently presents with multiple somatic symptoms but is decompensating, delusional, almost word-salad. Very paranoid and grandiose -- appears extraordinarily mentally ill. Has needed IM agitation medications twice  in two days. Last month it appears she swallowed the entirety of her Lexapro , leading to previous hospitalization. Extreme threat to self, possibly others considering irritability and extent of delusions. This is one of many admissions for her, with increasing frequency this year. Will push as hard as possible for long-acting injectible formulation and/or ACT team. Only way patient has a chance of maintaining independent living is with these measures -- patient understandably desires this independence. Will have this discussion after she regains insight. Expect longer hospital stay will be necessary for this patient. Will start Vraylar  4.5 mg at bedtime as mother reports good response to this medication and substitute prolixin  for thorazine  with eye towards LAI. Should we not see improvement this week, will consider switching from Vraylar  to Risperdal . Currently voluntary, will initiate IVC proceedings should patient sign 72 hour form.   Patient somewhat more irritable and down concerning interactions with staff, could be that patient's mania is subsiding. Amenable to increasing Vraylar  4.5--> 6 mg, patient notes she has been on this dosing before. Hopefully this also helps with sleep. No medication side effects. Is asking after discharge on Friday, discussed that this is a  day-by-day process.    Diagnoses / Active Problems: Bipolar I disorder, current episode mixed vs schizoaffective disorder, bipolar type, current episode mixed Cannabis use disorder   PLAN:   Safety and Monitoring: -  VOLUNTARY  admission to inpatient psychiatric unit for safety, stabilization and treatment. - Daily contact with patient to assess and evaluate symptoms and progress in treatment - Patient's case to be discussed in multi-disciplinary team meeting -  Observation Level : q15 minute checks -  Vital signs:  q12 hours -  Precautions: suicide, elopement, and assault   2. Psychiatric Diagnoses and Treatment:     # Bipolar I disorder, in manic episode # Cannabis use disorder - Increase vraylar  4.5 mg --> 6 mg at bedtime for mania/psychosis. - Continue Thorazine  50 mg qAM + 100 mg at bedtime for psychosis.  - Continue Cogentin  0.5 mg BID for EPS prophylaxis - Continue Ativan  1 mg TID PRN for agitiation.  - Will discuss with patient need to immediately discontinue all cannabis product/ACT team use when she has greater insight.  - The risks/benefits/side-effects/alternatives to this medication were discussed in detail with the patient and time was given for questions. The patient consents to medication trial.  - Metabolic profile and EKG monitoring obtained while on an atypical antipsychotic  BMI: 24.92 TSH: Ordered, awaiting Lipid panel: Ordered, awaiting HbgA1c: Ordered, awaiting QTc: 439 - Encouraged patient to participate in unit milieu and in scheduled group therapies  - Short Term Goals: Ability to identify changes in lifestyle to reduce recurrence of condition will improve, Ability to verbalize feelings will improve, and Ability to disclose and discuss suicidal ideas - Long Term Goals: Improvement in symptoms so as ready for discharge   Other PRNS: Agitation, anxiety, sleep, GI complaints   Other labs reviewed on admission: + THC               3. Medical Issues Being  Addressed:    # Abnormal vaginal bleeding - Workup 09/2023, will need follow-up with GYN, pap negative   4. Discharge Planning:    - Estimated discharge date: 5-8 days - Social work and case management to assist with discharge planning and identification of hospital follow-up needs prior to discharge. - Discharge concerns: Need to establish a safety plan; medication compliance and effectiveness. - Discharge goals: Return home with  outpatient referrals for mental health follow-up including medication management/psychotherapy.   I certify that inpatient services furnished can reasonably be expected to improve the patient's condition.     Signed: Odis Cleveland, MD [facility] Physician, PGY-2 05/19/2024 5:58 PM

## 2024-05-19 NOTE — Group Note (Unsigned)
 Date:  05/19/2024 Time:  8:29 PM  Group Topic/Focus:  Wrap-Up Group:   The focus of this group is to help patients review their daily goal of treatment and discuss progress on daily workbooks.     Participation Level:  {BHH PARTICIPATION OZCZO:77735}  Participation Quality:  {BHH PARTICIPATION QUALITY:22265}  Affect:  {BHH AFFECT:22266}  Cognitive:  {BHH COGNITIVE:22267}  Insight: {BHH Insight2:20797}  Engagement in Group:  {BHH ENGAGEMENT IN HMNLE:77731}  Modes of Intervention:  {BHH MODES OF INTERVENTION:22269}  Additional Comments:  ***  Jenean Escandon Dacosta 05/19/2024, 8:29 PM

## 2024-05-19 NOTE — Group Note (Signed)
 Date:  05/19/2024 Time:  8:33 PM  Group Topic/Focus:  Wrap-Up Group:   The focus of this group is to help patients review their daily goal of treatment and discuss progress on daily workbooks.    Participation Level:  Active  Participation Quality:  Appropriate  Affect:  Appropriate  Cognitive:  Appropriate  Insight: Appropriate  Engagement in Group:  Engaged  Modes of Intervention:  Education and Exploration  Additional Comments:  Patient attended and participated in group tonight.  She reports that today her goal was to do Art.  She did not get a chance to.  Gwenn Chillington Dacosta 05/19/2024, 8:33 PM

## 2024-05-19 NOTE — Progress Notes (Signed)
(  Sleep Hours) -5.25 (Any PRNs that were needed, meds refused, or side effects to meds)- ativan , trazodone  (Any disturbances and when (visitation, over night)- patient labile and intrusive with staff and other patients on the hall (Concerns raised by the patient)-  (SI/HI/AVH)- denies all

## 2024-05-19 NOTE — Plan of Care (Signed)
   Problem: Activity: Goal: Interest or engagement in activities will improve Outcome: Progressing   Problem: Coping: Goal: Ability to verbalize frustrations and anger appropriately will improve Outcome: Progressing   Problem: Safety: Goal: Periods of time without injury will increase Outcome: Progressing

## 2024-05-19 NOTE — Progress Notes (Addendum)
   05/19/24 2000  Psych Admission Type (Psych Patients Only)  Admission Status Voluntary  Psychosocial Assessment  Patient Complaints Restlessness;Irritability  Eye Contact Fair  Facial Expression Anxious;Sullen  Affect Appropriate to circumstance  Speech Logical/coherent;Tangential  Interaction Assertive;Intrusive  Motor Activity Fidgety;Pacing  Appearance/Hygiene Unremarkable  Behavior Characteristics Intrusive;Fidgety  Mood Preoccupied  Thought Process  Coherency Disorganized  Content Preoccupation;Delusions  Delusions Persecutory;Paranoid  Perception WDL  Hallucination None reported or observed  Judgment Impaired  Confusion UTA  Danger to Self  Current suicidal ideation? Denies  Danger to Others  Danger to Others None reported or observed   Progress note   D: Pt seen in the hallway having a conversation with herself. I pushed the bell. I had to because I was crying out for help and no one came. I have pain right here (points to right flank area) because my organs are dying. I'm on the psych unit from hell. I think that's what I'll call it. Pt denies SI, HI, AVH. Pt rates pain  10/10. Pt rates anxiety  0/10 and depression  0/10. Pt can be redirected. Intrusive and hyperverbal. No other concerns noted at this time.  A: Pt provided support and encouragement. Pt given scheduled medication as prescribed. Pt states that her Vraylar  works best when given with her nighttime medications. This is when she takes it at home. States she spoke to the provider about this. PRNs as appropriate. Q15 min checks for safety.   R: Pt safe on the unit. Will continue to monitor.

## 2024-05-19 NOTE — Group Note (Signed)
 Date:  05/19/2024 Time:  8:22 PM  Group Topic/Focus:  Wrap-Up Group:   The focus of this group is to help patients review their daily goal of treatment and discuss progress on daily workbooks.    Participation Level:  Active  Participation Quality:  Appropriate  Affect:  Appropriate  Cognitive:  Oriented  Insight: Appropriate  Engagement in Group:  Engaged  Modes of Intervention:  Education and Exploration  Additional Comments:  Patient attended and participated in group tonight.  Patient reports that her goal today was to do Art.  She did not get a chance to.  Gwenn Chillington Dacosta 05/19/2024, 8:22 PM

## 2024-05-19 NOTE — Progress Notes (Signed)
 Pt c/o pain 5/10 r/t indigestion. Pt is located right flank area and is described as sharp. Pt given PRN medications as appropriate.

## 2024-05-19 NOTE — Plan of Care (Signed)
   Problem: Activity: Goal: Interest or engagement in activities will improve Outcome: Progressing

## 2024-05-19 NOTE — Progress Notes (Signed)
 Prn, po, benadryl  50 mg and haldol  5 mg given to patient at 0921 for restlessness, agitation, and anxiety. Patient tolerated medication well with no side effect noted. Q 15 minutes safety observation in place. Staff will continue to provide support for patient.

## 2024-05-20 MED ORDER — POLYETHYLENE GLYCOL 3350 17 G PO PACK
17.0000 g | PACK | Freq: Every day | ORAL | Status: DC
  Administered 2024-05-20 – 2024-05-21 (×2): 17 g via ORAL
  Filled 2024-05-20 (×3): qty 1

## 2024-05-20 NOTE — Group Note (Signed)
 Recreation Therapy Group Note   Group Topic:Health and Wellness  Group Date: 05/20/2024 Start Time: 1034 End Time: 1104 Facilitators: Danyel Tobey-McCall, LRT,CTRS Location: [address] Hall Dayroom   Group Topic: Wellness  Goal Area(s) Addresses:  Patient will define components of whole wellness. Patient will verbalize benefit of whole wellness.  Behavioral Response: Active  Intervention: Meditation Music, Guided Imagery  Activity: LRT and patients discussed the importance of wellness and its impact on an individuals overall well-being. LRT and patients went through a series of yoga moves to stretch and engage muscles. LRT then played the mountain meditation. This meditation focused on taking on the aspects/structure of a mountain. In that visualization, patients were to see themselves being able to withstand anything that comes against them just like the mountain can withstand different types of weather or any pricks that may happen to it.   Education: Wellness, Building control surveyor.   Education Outcome: Acknowledges education/In group clarification offered/Needs additional education.    Affect/Mood: Appropriate   Participation Level: Engaged   Participation Quality: Independent   Behavior: Appropriate   Speech/Thought Process: Rational   Insight: Moderate   Judgement: Moderate   Modes of Intervention: Music   Patient Response to Interventions:  Engaged   Education Outcome:  In group clarification offered    Clinical Observations/Individualized Feedback: Pt was engaged with the yoga aspect of group. Pt led group with the yoga moves and explained what muscles each move worked. Pt explained painting her nails as a form of self care for her. Pt went on to say that her mom always questions/criticizes what she does as self care. Pt emphasized it being her choice in what she does to express herself.      Plan: Continue to engage patient in RT group sessions  2-3x/week.   Yukio Bisping-McCall, LRT,CTRS 05/20/2024 1:20 PM

## 2024-05-20 NOTE — Progress Notes (Signed)
   05/20/24 0800  Psych Admission Type (Psych Patients Only)  Admission Status Voluntary  Psychosocial Assessment  Patient Complaints Anxiety;Restlessness  Eye Contact Fair  Facial Expression Anxious;Animated  Affect Appropriate to circumstance  Speech Logical/coherent  Interaction Assertive  Motor Activity Fidgety  Appearance/Hygiene Unremarkable  Behavior Characteristics Intrusive;Fidgety  Mood Preoccupied  Thought Process  Coherency Disorganized  Content Preoccupation;Delusions  Delusions Grandeur;Persecutory  Perception WDL  Hallucination None reported or observed  Judgment Impaired  Confusion WDL  Danger to Self  Current suicidal ideation? Denies  Agreement Not to Harm Self Yes  Description of Agreement Verbal  Danger to Others  Danger to Others None reported or observed

## 2024-05-20 NOTE — Plan of Care (Signed)
  Problem: Education: Goal: Emotional status will improve Outcome: Progressing Goal: Mental status will improve Outcome: Progressing   Problem: Coping: Goal: Ability to verbalize frustrations and anger appropriately will improve Outcome: Progressing Goal: Ability to demonstrate self-control will improve Outcome: Progressing   Problem: Health Behavior/Discharge Planning: Goal: Compliance with treatment plan for underlying cause of condition will improve Outcome: Progressing   Problem: Physical Regulation: Goal: Ability to maintain clinical measurements within normal limits will improve Outcome: Progressing   Problem: Safety: Goal: Periods of time without injury will increase Outcome: Progressing

## 2024-05-20 NOTE — Progress Notes (Signed)
 [facility] Progress Note  05/20/2024 12:08 PM [facility]  MRN:  [id number]  Principal Problem: Severe manic bipolar 1 disorder with psychotic behavior (HCC) Diagnosis: Principal Problem:   Severe manic bipolar 1 disorder with psychotic behavior (HCC)   Reason for Admission:  Stacy [facility] is a 33 y.o. female  with a past psychiatric history of schizophrenia, schizoaffective disorder, bipolar type, bipolar disorder, schizophrenia, stimulant-induced psychosis, alcohol  use disorder in remission, cannabis use disorder, PTSD. Patient initially arrived to [facility] on 9/9 for manic episode with significant psychosis and paranoia, and admitted to [facility] on 9/9 for crisis stabilization. Of note, was recently discharged from [facility] and required IM Geodon  20 mg for severe agitation and irritability. PMHx is significant for abnormal uterine bleeding.  (admitted on 05/13/2024, total  LOS: 7 days )  Overnight events: VSS. Slept 3 hours. Constantly in hallway, somatic issues. Noted abdominal tightness, and passing gas. Right flank pain. Indigestion. Denied SI, HI, AVH. Milk of mag x1. Ativan  1 mg x1. Maalox x1. Tylenol  650 mg x1.   On interview: Patient did not sleep last night and spent a lot of time on an art project: customizing her scrubs with political iconography ([employer] -- a Therapist, occupational group, among others). Would be interested in an art therapy group, agreed with her that this would be a goo didea. Says that she doesn't feel manic, but that sometimes she is more productive at night. Still grandiose and tangential speech, although pleasant. Noted increased flautus after lunch yesterday with accompanying occasional sharp pains that she believes is gas. Pain is 4.5/10 currently from 10/10 yesterday. Tender to palpation, largely in RLQ but no other symptomatology at this time. Will report new symptoms. Amenable to trying miralax . Denied SI, HI and AVH. Still perseverative and with present  with limited insight -- believes she was cured of schizophrenia (no AH/VH in years) but now has schizoaffective disorder, bipolar type.   Past Psychiatric History: Current psychiatrist: Dr. Sable Current therapist: Unclear Previous psychiatric diagnoses: schizophrenia, schizoaffective disorder, bipolar type, bipolar disorder, schizophrenia, stimulant-induced psychosis, alcohol  use disorder in remission, cannabis use disorder, PTSD.  Current psychiatric medications: Vraylar  3.0 mg at bedtime, thorazine  100 mg at bedtime, doxazosin  1 mg at bedtime, mirtazapine  7.5 at bedtime, topamax  50 mg at bedtime. Psychiatric medication history/compliance: Recently on lexapro  20 mg but this medication was discontinued as patient attempted to overdose on it. Has been on Zyprexa  for a while per mom, but this stopped being effective. Previously trialed clozapine and risperdal  to no effect. Lamictal  and abilify.  Psychiatric hospitalization(s): This is her 21st hospitalization. Recently at [facility] for two weeks, discharged ~1 week ago. Has had 12+ hospitalizations over the last decade per chart review, for similar symptomatology. Psychotherapy history: Unclear Neuromodulation history:  History of suicide (obtained from HPI): Attempted OD on lexapro  04/2024, has told others that it was an attempt to end her life History of homicide or aggression (obtained in HPI):    Substance Abuse History: Alcohol : Previous serious alcohol  use history, ETOH negative on admission, alcohol  withdrawal seizures Tobacco: Occasional previous use Cannabis: THC positive IV drug use: None Prescription drug use: None Other illicit drugs: None Rehab history: None   Past Medical History: PCP: None Medical diagnoses: Abnormal vaginal bleeding, diagnosed 09/2023 Medications: Tylenol  Allergies: Abilify (acne), carbamazepine  (suicidal?), Lamotrigine  (RASH), tetracycline (pseudotumor cerebri), pork  products Hospitalizations: None Surgeries: None known  Trauma: 18 concussions from soccer Seizures:    LMP: 10 days  ago Contraceptives: None known   Social History: Living situation: Alone Education: Nearly finished college 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 Yes.    Has the patient been a risk to self in the past 6 months? Yes.    Has the patient been a risk to self within the distant past? Yes.     Is the patient a risk to others? No.  Has the patient been a risk to others in the past 6 months? Yes.    Has the patient been a risk to others within the distant past? Yes.     Current Medications: Current Facility-Administered Medications  Medication Dose Route Frequency Provider Last Rate Last Admin   acetaminophen  (TYLENOL ) tablet 650 mg  650 mg Oral Q6H PRN Motley-Mangrum, Jadeka A, PMHNP   650 mg at 05/19/24 2334   alum & mag hydroxide-simeth (MAALOX/MYLANTA) 200-200-20 MG/5ML suspension 30 mL  30 mL Oral Q4H PRN Motley-Mangrum, Jadeka A, PMHNP   30 mL at 05/19/24 2334   benztropine  (COGENTIN ) tablet 0.5 mg  0.5 mg Oral BID Rollene Katz, Poole   0.5 mg at 05/20/24 0801   cariprazine  (VRAYLAR ) capsule 6 mg  6 mg Oral QPM Rollene Katz, Poole   6 mg at 05/19/24 2033   chlorproMAZINE  (THORAZINE ) tablet 100 mg  100 mg Oral QHS Addie Alonge, Poole   100 mg at 05/19/24 2033   chlorproMAZINE  (THORAZINE ) tablet 50 mg  50 mg Oral Daily Rollene Katz, Poole   50 mg at 05/20/24 0801   haloperidol  (HALDOL ) tablet 5 mg  5 mg Oral TID PRN Motley-Mangrum, Jadeka A, PMHNP   5 mg at 05/19/24 9078   And   diphenhydrAMINE  (BENADRYL ) capsule 50 mg  50 mg Oral TID PRN  Motley-Mangrum, Jadeka A, PMHNP   50 mg at 05/19/24 9078   haloperidol  lactate (HALDOL ) injection 5 mg  5 mg Intramuscular TID PRN Motley-Mangrum, Jadeka A, PMHNP       And   diphenhydrAMINE  (BENADRYL ) injection 50 mg  50 mg Intramuscular TID PRN Motley-Mangrum, Jadeka A, PMHNP       And   LORazepam  (ATIVAN ) injection 2 mg  2 mg Intramuscular TID PRN Motley-Mangrum, Jadeka A, PMHNP       haloperidol  lactate (HALDOL ) injection 10 mg  10 mg Intramuscular TID PRN Motley-Mangrum, Jadeka A, PMHNP   10 mg at 05/14/24 1044   And   diphenhydrAMINE  (BENADRYL ) injection 50 mg  50 mg Intramuscular TID PRN Motley-Mangrum, Jadeka A, PMHNP   50 mg at 05/14/24 1044   And   LORazepam  (ATIVAN ) injection 2 mg  2 mg Intramuscular TID PRN Motley-Mangrum, Jadeka A, PMHNP   2 mg at 05/14/24 1043   doxazosin  (CARDURA ) tablet 1 mg  1 mg Oral QHS Rollene Katz, Poole   1 mg at 05/19/24 2034   LORazepam  (ATIVAN ) tablet 1 mg  1 mg Oral TID PRN Delsie Lynwood Katz Lavone, Poole   1 mg at 05/20/24 0146   magnesium  hydroxide (MILK OF MAGNESIA) suspension 30 mL  30 mL Oral Daily PRN Motley-Mangrum, Jadeka A, PMHNP   30 mL at 05/20/24 0039   mirtazapine  (REMERON ) tablet 7.5 mg  7.5 mg Oral QHS  Motley-Mangrum, Jadeka A, PMHNP   7.5 mg at 05/19/24 2034   polyethylene glycol (MIRALAX  / GLYCOLAX ) packet 17 g  17 g Oral Daily Rollene Katz, Poole       topiramate  (TOPAMAX ) tablet 50 mg  50 mg Oral BID Motley-Mangrum, Jadeka A, PMHNP   50 mg at 05/20/24 0801    Lab Results:  No results found for this or any previous visit (from the past 48 hours).   Blood Alcohol  level:  Lab Results  Component Value Date   [facility] <15 05/13/2024   ETH <15 04/21/2024    Metabolic Labs: Lab Results  Component Value Date   HGBA1C 4.9 05/15/2024   MPG 93.93 05/15/2024   MPG 91.06 09/02/2021   Lab Results  Component Value Date   PROLACTIN 11.5 09/21/2023   Lab Results  Component Value Date   CHOL 151 05/15/2024   TRIG 56  05/15/2024   HDL 60 05/15/2024   CHOLHDL 2.5 05/15/2024   VLDL 11 05/15/2024   LDLCALC 80 05/15/2024   LDLCALC 78 09/02/2021    Physical Findings: AIMS: No  CIWA:    COWS:     Psychiatric Specialty Exam:  Presentation   General Appearance: Bizarre  Eye Contact: -- (intense)  Speech: Pressured  Speech Volume: Normal  Handedness: Right   Mood and Affect   Mood: Euphoric; Euthymic  Affect: Congruent   Thinking   Thought Processes: Irrevelant (occasionally returns to subject at hand)  Descriptions of Associations: Loose  Orientation: Full (Time, Place and Person)  Thought Content: Illogical; Delusions (more logical today)  History of Schizophrenia/Schizoaffective disorder: Yes   Duration of Psychotic Symptoms: >1 month  Hallucinations: None  Ideas of Reference: Delusions  Suicidal Thoughts: No  Homicidal Thoughts: No   Sensorium    Memory: Immediate Fair  Judgment: Impaired  Insight: Present (improving)   Executive Functions    Concentration: Poor  Attention Span: Fair  Recall: [employer] of Knowledge: Fair  Language: Fair   Psychomotor Activity: Normal    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  Pulmonary:     Effort: Pulmonary effort is normal.  Neurological:     Mental Status: She is alert and oriented to person, place, and time.    Review of Systems  Constitutional:  Negative for fever.  Respiratory:  Negative for shortness of breath.   Cardiovascular:  Negative for chest pain.  Gastrointestinal:  Negative for abdominal pain, constipation and diarrhea.  Psychiatric/Behavioral:  Negative for depression, hallucinations, substance abuse and suicidal ideas. The patient does not have insomnia.    Blood pressure 111/71, pulse 83, temperature (!) 97.5 F (36.4 C), temperature source Oral, resp. rate 20, height 5' 4 (1.626 m), weight  65.9 kg, last menstrual period 04/07/2024, SpO2 100%. Body mass index is 24.92 kg/m.   Treatment Plan Summary: Daily contact with patient to assess and evaluate symptoms and progress in treatment and Medication management     ASSESSMENT:    [facility] is a 33 year old female who presents in an acute mixed episode (Bipolar 1 disorder, current episode mixed vs schizoaffective disorder, bipolar type, current episode mixed). Currently presents with multiple somatic symptoms but is decompensating, delusional, almost word-salad. Very paranoid and grandiose -- appears extraordinarily mentally ill. Has needed IM agitation medications twice in two days. Last month it appears she swallowed the entirety of her Lexapro , leading to previous hospitalization. Extreme threat  to self, possibly others considering irritability and extent of delusions. This is one of many admissions for her, with increasing frequency this year. Will push as hard as possible for long-acting injectible formulation and/or ACT team. Only way patient has a chance of maintaining independent living is with these measures -- patient understandably desires this independence. Will have this discussion after she regains insight. Expect longer hospital stay will be necessary for this patient. Will start Vraylar  4.5 mg at bedtime as mother reports good response to this medication and substitute prolixin  for thorazine  with eye towards LAI. Should we not see improvement this week, will consider switching from Vraylar  to Risperdal . Currently voluntary, will initiate IVC proceedings should patient sign 72 hour form.   Moved to Vraylar  6 mg yesterday. Essentially unchanged. Will continue and discuss with mom tomorrow if presentation does not change. Will consider risperdal . Sharp, sudden-onset abdominal pain with attendant worsening flatus likely constipation, not uncommon on her current antipsychotics regimen (I've dealt with this a lot.) With add a  dose of miralax  and follow for continuing symptoms. Discontinuing trazodone  in setting of acute mania, as this may be paradoxically worsening sleep d/t serotonin reuptake activity.    Diagnoses / Active Problems: Bipolar I disorder, current episode mixed vs schizoaffective disorder, bipolar type, current episode mixed Cannabis use disorder   PLAN:   Safety and Monitoring: -  VOLUNTARY  admission to inpatient psychiatric unit for safety, stabilization and treatment. - Daily contact with patient to assess and evaluate symptoms and progress in treatment - Patient's case to be discussed in multi-disciplinary team meeting -  Observation Level : q15 minute checks -  Vital signs:  q12 hours -  Precautions: suicide, elopement, and assault   2. Psychiatric Diagnoses and Treatment:     # Schizoaffective disorder, bipolar type, in manic episode # Cannabis use disorder - Continue vraylar  6 mg at bedtime for mania/psychosis. - Continue Thorazine  50 mg qAM + 100 mg at bedtime for psychosis.  - Continue Cogentin  0.5 mg BID for EPS prophylaxis - Continue Ativan  1 mg TID PRN for agitiation.  - Stop Trazodone  50 mg PRN at bedtime for sleep - Will discuss with patient need to immediately discontinue all cannabis product/ACT team use when she has greater insight.  - The risks/benefits/side-effects/alternatives to this medication were discussed in detail with the patient and time was given for questions. The patient consents to medication trial.  - Metabolic profile and EKG monitoring obtained while on an atypical antipsychotic  BMI: 24.92 TSH: Ordered, awaiting Lipid panel: Ordered, awaiting HbgA1c: Ordered, awaiting QTc: 439 - Encouraged patient to participate in unit milieu and in scheduled group therapies  - Short Term Goals: Ability to identify changes in lifestyle to reduce recurrence of condition will improve, Ability to verbalize feelings will improve, and Ability to disclose and discuss  suicidal ideas - Long Term Goals: Improvement in symptoms so as ready for discharge   Other PRNS: Agitation, anxiety, sleep, GI complaints   Other labs reviewed on admission: + THC               3. Medical Issues Being Addressed:    # Abnormal vaginal bleeding - Workup 09/2023, will need follow-up with GYN, pap negative   4. Discharge Planning:    - Estimated discharge date: 4-8 days - Social work and case management to assist with discharge planning and identification of hospital follow-up needs prior to discharge. - Discharge concerns: Need to establish a safety plan; medication compliance and  effectiveness. - Discharge goals: Return home with outpatient referrals for mental health follow-up including medication management/psychotherapy.   I certify that inpatient services furnished can reasonably be expected to improve the patient's condition.     Signed: Odis Cleveland, [facility] Physician, PGY-2 05/20/2024 12:08 PM

## 2024-05-20 NOTE — [facility] Group Notes (Signed)
 Adult Psychoeducational Group Note  Date:  05/20/2024 Time:  8:27 PM  Group Topic/Focus:  Wrap-Up Group:   The focus of this group is to help patients review their daily goal of treatment and discuss progress on daily workbooks.  Participation Level:  Did Not Attend  Aisha Celestine Ruth 05/20/2024, 8:27 PM

## 2024-05-20 NOTE — Group Note (Signed)
 Recreation Therapy Group Note   Group Topic:Animal Assisted Therapy   Group Date: 05/20/2024 Start Time: 0947 End Time: 1030 Facilitators: Milania Haubner-McCall, LRT,CTRS Location: [address] Dayroom   Animal-Assisted Activity (AAA) Program Checklist/Progress Notes Patient Eligibility Criteria Checklist & Daily Group note for Rec Tx Intervention  AAA/T Program Assumption of Risk Form signed by Patient/ or Parent Legal Guardian Yes  Patient is free of allergies or severe asthma Yes  Patient reports no fear of animals Yes  Patient reports no history of cruelty to animals Yes  Patient understands his/her participation is voluntary Yes  Patient washes hands before animal contact Yes  Patient washes hands after animal contact Yes  Behavioral Response: Moderate   Education: Charity fundraiser, Appropriate Animal Interaction   Education Outcome: Acknowledges education.    Affect/Mood: Appropriate   Participation Level: Engaged   Participation Quality: Independent   Behavior: Cooperative   Speech/Thought Process: Relevant   Insight: Fair   Judgement: Fair    Modes of Intervention: Teaching laboratory technician   Patient Response to Interventions:  Engaged   Education Outcome:  In group clarification offered    Clinical Observations/Individualized Feedback: Patient attended session and interacted appropriately with therapy dog and peers. Patient had a few outburst during session but was redirectable. Patient asked questions about therapy dog and his training. Patient shared stories about their pets at home with group.    Plan: Continue to engage patient in RT group sessions 2-3x/week.   Chareese Sergent-McCall, LRT,CTRS 05/20/2024 1:02 PM

## 2024-05-20 NOTE — Plan of Care (Signed)
  Problem: Activity: Goal: Interest or engagement in activities will improve Outcome: Progressing   Problem: Coping: Goal: Ability to verbalize frustrations and anger appropriately will improve Outcome: Progressing   Problem: Physical Regulation: Goal: Ability to maintain clinical measurements within normal limits will improve Outcome: Progressing

## 2024-05-20 NOTE — Progress Notes (Signed)
 This writer went to check on pt to re-assess her pain. Pt states she is still having sharp pains. States that she is passing gas and her abdomen seems tight. She endorses having a bowel movement yesterday (9/15) but not a substantial one. Asking for MOM. I won't be able to sleep with this pain.Can I have some Milk of Magnesia? Pt states her circadian rhythms are different than most people d/t pseudotumor cerebri. It's affected my vision. I slept a lot today so I'm not sleepy. 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 I have some inspiration now. Will continue to monitor.

## 2024-05-20 NOTE — Progress Notes (Signed)
(  Sleep Hours) - 3 (Any PRNs that were needed, meds refused, or side effects to meds)- Tylenol  650 for right flank pain; Maalox for indigestion; MOM for stated constipation; Ativan  1 mg for restlessness and intrusiveness (Any disturbances and when (visitation, over night)- constantly in the hallway; somatic issues (Concerns raised by the patient)- somatic issues (SI/HI/AVH)- denies

## 2024-05-21 MED ORDER — CHLORPROMAZINE HCL 50 MG PO TABS
150.0000 mg | ORAL_TABLET | Freq: Every day | ORAL | Status: DC
  Administered 2024-05-21 – 2024-05-22 (×2): 150 mg via ORAL
  Filled 2024-05-21: qty 6
  Filled 2024-05-21 (×2): qty 3

## 2024-05-21 NOTE — Plan of Care (Signed)
  Problem: Coping: Goal: Ability to verbalize frustrations and anger appropriately will improve Outcome: Progressing Goal: Ability to demonstrate self-control will improve Outcome: Progressing   Problem: Health Behavior/Discharge Planning: Goal: Identification of resources available to assist in meeting health care needs will improve Outcome: Progressing   

## 2024-05-21 NOTE — Plan of Care (Signed)
   Problem: Education: Goal: Emotional status will improve Outcome: Not Progressing Goal: Mental status will improve Outcome: Not Progressing Goal: Verbalization of understanding the information provided will improve Outcome: Not Progressing

## 2024-05-21 NOTE — Group Note (Unsigned)
 Date:  05/22/2024 Time:  5:20 AM  Group Topic/Focus:  Wrap-Up Group:   The focus of this group is to help patients review their daily goal of treatment and discuss progress on daily workbooks.    Participation Level:  Minimal  Participation Quality:  Appropriate and Sharing  Affect:  Appropriate and Flat  Cognitive:  Appropriate  Insight: Appropriate and Limited  Engagement in Group:  Engaged and Limited  Modes of Intervention:  Discussion and Socialization  Additional Comments:  Patient shared that she had a good day. Patient shared that she went outside today and walked around. Patient also shared that she colored and read today which was one of her goals. Patient rated her day a 7 out of 10.  Eward Mace 05/22/2024, 5:20 AM

## 2024-05-21 NOTE — Group Note (Signed)
 Recreation Therapy Group Note   Group Topic:Leisure Education  Group Date: 05/21/2024 Start Time: 1010 End Time: 1035 Facilitators: Reona Zendejas-McCall, LRT,CTRS Location: [address] Dayroom   Group Topic: Leisure Education  Goal Area(s) Addresses:  Patient will identify positive leisure activities for use post discharge. Patient will identify at least one positive benefit of participation in leisure activities.  Patient will work effectively work with peers to keep the ball in play.  Behavioral Response:    Intervention: [employer], Music   Activity: Patients were to sit in a circle. Patients would toss a beach ball to each other keeping the ball in motion. LRT would time the group to see how long they could keep the ball moving. Patients could bounce or roll the ball but it could not come to a stop. If the ball were to stop moving, LRT would start the timer over.  Education:  Leisure Scientist, physiological, Special educational needs teacher, Teamwork, Discharge Planning  Education Outcome: Acknowledges education/In group clarification offered/Needs additional education.    Affect/Mood: N/A   Participation Level: Did not attend    Clinical Observations/Individualized Feedback:     Plan: Continue to engage patient in RT group sessions 2-3x/week.   Zeina Akkerman-McCall, LRT,CTRS  05/21/2024 1:14 PM

## 2024-05-21 NOTE — Progress Notes (Signed)
   05/21/24 0855  Psych Admission Type (Psych Patients Only)  Admission Status Voluntary  Psychosocial Assessment  Patient Complaints Anxiety;Restlessness  Eye Contact Fair  Facial Expression Flat  Affect Preoccupied;Anxious  Speech Logical/coherent  Interaction Intrusive;Attention-seeking  Motor Activity Fidgety;Restless  Appearance/Hygiene Bizarre;Layered clothes  Behavior Characteristics Intrusive;Restless;Fidgety  Mood Preoccupied;Anxious  Thought Chartered certified accountant of ideas;Tangential  Content Preoccupation;Delusions  Delusions Grandeur;Referential  Perception WDL  Hallucination None reported or observed  Judgment Impaired  Confusion None  Danger to Self  Current suicidal ideation? Denies  Agreement Not to Harm Self Yes  Description of Agreement Verbal  Danger to Others  Danger to Others None reported or observed
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
## 2024-05-21 NOTE — Progress Notes (Signed)
(  Sleep Hours) - 2.25  (Any PRNs that were needed, meds refused, or side effects to meds)- Ativan  for sleep - Not effective  (Any disturbances and when (visitation, over night)- None  (Concerns raised by the patient)- Sleep disturbance  (SI/HI/AVH)- Responding to internal stimuli.

## 2024-05-22 MED ORDER — CLONAZEPAM 0.5 MG PO TABS
0.5000 mg | ORAL_TABLET | Freq: Three times a day (TID) | ORAL | Status: DC | PRN
  Administered 2024-05-22 – 2024-05-23 (×2): 0.5 mg via ORAL
  Filled 2024-05-22 (×2): qty 1

## 2024-05-22 MED ORDER — WHITE PETROLATUM EX OINT
TOPICAL_OINTMENT | CUTANEOUS | Status: AC
  Administered 2024-05-22: 1
  Filled 2024-05-22: qty 5

## 2024-05-22 NOTE — Progress Notes (Signed)
 Patient increasingly delusional and intrusive. Patient disruptive to the milieu. PO haldol  and benadryl  administered. Safety maintained. Will continue to monitor.

## 2024-05-22 NOTE — Group Note (Deleted)
 Date:  05/22/2024 Time:  8:25 PM  Group Topic/Focus:  Wrap-Up Group:   The focus of this group is to help patients review their daily goal of treatment and discuss progress on daily workbooks.     Participation Level:  {BHH PARTICIPATION OZCZO:77735}  Participation Quality:  {BHH PARTICIPATION QUALITY:22265}  Affect:  {BHH AFFECT:22266}  Cognitive:  {BHH COGNITIVE:22267}  Insight: {BHH Insight2:20797}  Engagement in Group:  {BHH ENGAGEMENT IN HMNLE:77731}  Modes of Intervention:  {BHH MODES OF INTERVENTION:22269}  Additional Comments:  ***  Stacy Poole 05/22/2024, 8:25 PM

## 2024-05-22 NOTE — BHH Group Notes (Signed)
 Adult Psychoeducational Group Note  Date:  05/22/2024 Time:  9:07 PM  Group Topic/Focus:  Wrap-Up Group:   The focus of this group is to help patients review their daily goal of treatment and discuss progress on daily workbooks.  Participation Level:  None  Additional Comments:  Pt initially attended the evening group, but left and did not respond to discussion prompts from the Writer, instead angrily rambling about another patient on the hallway.  Lubertha Leite Lee 05/22/2024, 9:07 PM

## 2024-05-22 NOTE — Progress Notes (Signed)
 D: Patient is alert, delusional, intrusive, labile, and mostly cooperative. Denies SI, HI, AVH, and verbally contracts for safety. Patient reports they've got me hooked on ativan . Patient reports headache.    A: Scheduled medications administered per MD order. PRN haldol  and benadryl  administered twice (see previous notes). PRN tylenol  administered. Support provided. Patient educated on safety on the unit and medications. Routine safety checks every 15 minutes. Patient stated understanding to tell nurse about any new physical symptoms. Patient understands to tell staff of any needs.     R: No adverse drug reactions noted. Patient remains safe at this time and will continue to monitor.    05/22/24 1100  Psych Admission Type (Psych Patients Only)  Admission Status Voluntary  Psychosocial Assessment  Patient Complaints Anxiety;Restlessness;Irritability  Eye Contact Fair  Facial Expression Flat  Affect Preoccupied;Anxious;Labile  Speech Logical/coherent  Interaction Intrusive;Attention-seeking  Motor Activity Restless;Fidgety  Appearance/Hygiene Bizarre;Layered clothes  Behavior Characteristics Intrusive  Mood Preoccupied;Anxious;Labile  Thought Chartered certified accountant of ideas;Tangential  Content Preoccupation;Delusions  Delusions Grandeur;Referential  Perception WDL  Hallucination None reported or observed  Judgment Impaired  Confusion None  Danger to Self  Current suicidal ideation? Denies  Agreement Not to Harm Self Yes  Description of Agreement verbal  Danger to Others  Danger to Others None reported or observed

## 2024-05-22 NOTE — BHH Group Notes (Signed)
 Adult Psychoeducational Group Note  Date:  05/22/2024 Time:  4:16 PM  Group Topic/Focus:  Goals Group:   The focus of this group is to help patients establish daily goals to achieve during treatment and discuss how the patient can incorporate goal setting into their daily lives to aide in recovery. Orientation:   The focus of this group is to educate the patient on the purpose and policies of crisis stabilization and provide a format to answer questions about their admission.  The group details unit policies and expectations of patients while admitted.  Participation Level:  Did Not Attend  Participation Quality:    Affect:    Cognitive:    Insight:   Engagement in Group:    Modes of Intervention:    Additional Comments:    Fatema Rabe O 05/22/2024, 4:16 PM

## 2024-05-22 NOTE — Plan of Care (Signed)
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
## 2024-05-22 NOTE — Plan of Care (Signed)
   Problem: Education: Goal: Knowledge of [facility] Education information/materials will improve Outcome: Progressing Goal: Emotional status will improve Outcome: Progressing Goal: Mental status will improve Outcome: Progressing Goal: Verbalization of understanding the information provided will improve Outcome: Progressing

## 2024-05-22 NOTE — BHH Counselor (Signed)
 CSW scanned and sent referral to [facility] for ACTT services.   Shaletha Humble, LCSWA 05/22/24 2:55PM

## 2024-05-22 NOTE — Plan of Care (Signed)
  Problem: Education: Goal: Emotional status will improve Outcome: Progressing   Problem: Activity: Goal: Interest or engagement in activities will improve Outcome: Progressing   Problem: Safety: Goal: Periods of time without injury will increase Outcome: Progressing   Problem: Education: Goal: Mental status will improve Outcome: Not Progressing
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
## 2024-05-22 NOTE — Progress Notes (Signed)
(  Sleep Hours) - 6hrs (Any PRNs that were needed, meds refused, or side effects to meds)- ativan  (Any disturbances and when (visitation, over night)- none (Concerns raised by the patient)- none (SI/HI/AVH)- denies

## 2024-05-22 NOTE — Progress Notes (Signed)
 Patient left the evening group early since she felt uncomfortable with one of her female peers. She states that her peer said something that made her very uncomfortable and has been focused on that person since then.

## 2024-05-22 NOTE — Progress Notes (Signed)
 Patient increasingly agitated. Patient is delusional. Speech is loud and argumentative. Patient disruptive to the milieu. PO haldol  and benadryl  administered. Safety maintained. Will continue to monitor.

## 2024-05-22 NOTE — Group Note (Signed)
 Recreation Therapy Group Note   Group Topic:Other  Group Date: 05/22/2024 Start Time: 1005 End Time: 1043 Facilitators: Donelda Mailhot-McCall, LRT,CTRS Location: [address] Dayroom   Group Topic/Focus: Self Expression   Goal Area(s) Addresses:  Patient will share their idea of self expression. Patient will be able to identify a variety of ways one can express themself. Patient will successfully share why it is beneficial to express yourself.  Behavioral Response: Engaged, Loud  Intervention: Music  Activity:  Patients were able to select songs they felt helped them express/show their emotions. Patients could sing a long or dance to the songs as they played during group. Patients were allowed to any song as long as it was clean and appropriate.   Education: Communication, Investment banker, corporate Outcome: Acknowledges education, Self Expression   Affect/Mood: Blunted   Participation Level: Engaged and Hyperverbal   Participation Quality: Independent   Behavior: Hyperverbal and Interactive    Speech/Thought Process: Delusional and Loud   Insight: Fair   Judgement: Fair    Modes of Intervention: Music   Patient Response to Interventions:  Engaged   Education Outcome:  In group clarification offered    Clinical Observations/Individualized Feedback: Pt was talkative and would make random statements that had nothing to do with group. Pt was social with peers but could be redirected if she got to loud. Pt was also able to explain she songs she picked and what they ment to her. Pt would also make up her own lyrics to some of the songs as well.      Plan: Continue to engage patient in RT group sessions 2-3x/week.   Deloros Beretta-McCall, LRT,CTRS 05/22/2024 10:51 AM

## 2024-05-23 ENCOUNTER — Encounter (HOSPITAL_COMMUNITY): Payer: Self-pay

## 2024-05-23 MED ORDER — CLONAZEPAM 0.5 MG PO TABS: 0.5000 mg | ORAL_TABLET | Freq: Three times a day (TID) | ORAL | Status: DC | PRN

## 2024-05-23 MED ORDER — CLONAZEPAM 0.5 MG PO TABS
1.0000 mg | ORAL_TABLET | Freq: Every day | ORAL | Status: DC
  Administered 2024-05-23 – 2024-05-25 (×3): 1 mg via ORAL
  Filled 2024-05-23 (×3): qty 2

## 2024-05-23 MED ORDER — CHLORPROMAZINE HCL 50 MG PO TABS
200.0000 mg | ORAL_TABLET | Freq: Every day | ORAL | Status: DC
  Administered 2024-05-23 – 2024-05-25 (×3): 200 mg via ORAL
  Filled 2024-05-23 (×3): qty 4

## 2024-05-23 NOTE — Group Note (Signed)
 Date:  05/23/2024 Time:  8:21 PM  Group Topic/Focus:  Wrap-Up Group:   The focus of this group is to help patients review their daily goal of treatment and discuss progress on daily workbooks.    Participation Level:  Active  Participation Quality:  Resistant  Affect:  Labile  Cognitive:  Oriented  Insight: Limited  Engagement in Group:  Engaged  Modes of Intervention:  Education and Exploration  Additional Comments:  Patient attended and participated in group tonight. She reports that today she learn that she can only trust herself.  Gwenn Chillington Dacosta 05/23/2024, 8:21 PM

## 2024-05-23 NOTE — Progress Notes (Signed)
 The patient has come out of her bedroom approximately three times over the past hour, Most recently, she informed this dino that she had a bout of diarrhea and needed fluids to drink. After giving her cold water  to drink, she verbalized to this dino that she will be losing her housing and that she is currently on [place] time and that she hates all veterans. She is now in her bedroom yelling to herself.

## 2024-05-23 NOTE — BH IP Treatment Plan (Signed)
 Interdisciplinary Treatment and Diagnostic Plan Update  05/23/2024 Time of Session: 9:20am - UPDATE Stacy Poole MRN: [id number]  Principal Diagnosis: Severe manic bipolar 1 disorder with psychotic behavior (HCC)  Secondary Diagnoses: Principal Problem:   Severe manic bipolar 1 disorder with psychotic behavior (HCC)   Current Medications:  Current Facility-Administered Medications  Medication Dose Route Frequency Provider Last Rate Last Admin   acetaminophen  (TYLENOL ) tablet 650 mg  650 mg Oral Q6H PRN Motley-Mangrum, Jadeka A, PMHNP   650 mg at 05/23/24 0142   alum & mag hydroxide-simeth (MAALOX/MYLANTA) 200-200-20 MG/5ML suspension 30 mL  30 mL Oral Q4H PRN Motley-Mangrum, Jadeka A, PMHNP   30 mL at 05/23/24 0756   benztropine  (COGENTIN ) tablet 0.5 mg  0.5 mg Oral BID Rollene Katz, MD   0.5 mg at 05/23/24 9261   cariprazine  (VRAYLAR ) capsule 6 mg  6 mg Oral QPM Rollene Katz, MD   6 mg at 05/22/24 2055   chlorproMAZINE  (THORAZINE ) tablet 150 mg  150 mg Oral QHS Crawford, Benjamin, MD   150 mg at 05/22/24 2055   chlorproMAZINE  (THORAZINE ) tablet 50 mg  50 mg Oral Daily Rollene Katz, MD   50 mg at 05/23/24 9261   clonazePAM  (KLONOPIN ) tablet 0.5 mg  0.5 mg Oral TID PRN Rollene Katz, MD   0.5 mg at 05/23/24 0102   haloperidol  (HALDOL ) tablet 5 mg  5 mg Oral TID PRN Motley-Mangrum, Jadeka A, PMHNP   5 mg at 05/22/24 1352   And   diphenhydrAMINE  (BENADRYL ) capsule 50 mg  50 mg Oral TID PRN Motley-Mangrum, Jadeka A, PMHNP   50 mg at 05/22/24 1352   haloperidol  lactate (HALDOL ) injection 5 mg  5 mg Intramuscular TID PRN Motley-Mangrum, Jadeka A, PMHNP       And   diphenhydrAMINE  (BENADRYL ) injection 50 mg  50 mg Intramuscular TID PRN Motley-Mangrum, Jadeka A, PMHNP       And   LORazepam  (ATIVAN ) injection 2 mg  2 mg Intramuscular TID PRN Motley-Mangrum, Jadeka A, PMHNP       haloperidol  lactate (HALDOL ) injection 10 mg  10 mg Intramuscular TID PRN Motley-Mangrum,  Jadeka A, PMHNP   10 mg at 05/14/24 1044   And   diphenhydrAMINE  (BENADRYL ) injection 50 mg  50 mg Intramuscular TID PRN Motley-Mangrum, Jadeka A, PMHNP   50 mg at 05/14/24 1044   And   LORazepam  (ATIVAN ) injection 2 mg  2 mg Intramuscular TID PRN Motley-Mangrum, Jadeka A, PMHNP   2 mg at 05/14/24 1043   doxazosin  (CARDURA ) tablet 1 mg  1 mg Oral QHS Rollene Katz, MD   1 mg at 05/22/24 2055   magnesium  hydroxide (MILK OF MAGNESIA) suspension 30 mL  30 mL Oral Daily PRN Motley-Mangrum, Jadeka A, PMHNP   30 mL at 05/20/24 1805   mirtazapine  (REMERON ) tablet 7.5 mg  7.5 mg Oral QHS Motley-Mangrum, Jadeka A, PMHNP   7.5 mg at 05/22/24 2055   topiramate  (TOPAMAX ) tablet 50 mg  50 mg Oral BID Motley-Mangrum, Jadeka A, PMHNP   50 mg at 05/23/24 0738   PTA Medications: Medications Prior to Admission  Medication Sig Dispense Refill Last Dose/Taking   Cariprazine  HCl (VRAYLAR ) 4.5 MG CAPS Take 4.5 mg by mouth at bedtime. (Patient not taking: Reported on 05/13/2024)      chlorproMAZINE  (THORAZINE ) 100 MG tablet Take 100 mg by mouth at bedtime. (Patient not taking: Reported on 05/13/2024)      doxazosin  (CARDURA ) 1 MG tablet Take 1 mg by mouth at  bedtime.      escitalopram  (LEXAPRO ) 20 MG tablet Take 20 mg by mouth at bedtime. (Patient not taking: Reported on 05/13/2024)      hydrOXYzine  (ATARAX ) 25 MG tablet Take 1 tablet (25 mg total) by mouth 3 (three) times daily as needed for anxiety. (Patient taking differently: Take 25 mg by mouth daily as needed for anxiety.) 90 tablet 0    mirtazapine  (REMERON ) 7.5 MG tablet Take 7.5 mg by mouth at bedtime.      topiramate  (TOPAMAX ) 50 MG tablet Take 50 mg by mouth in the morning and at bedtime.      TYLENOL  500 MG tablet Take 500 mg by mouth daily as needed for mild pain (pain score 1-3), moderate pain (pain score 4-6) or headache.      VRAYLAR  3 MG capsule Take 3 mg by mouth at bedtime.       Patient Stressors: Medication change or noncompliance   Substance  abuse   Traumatic event    Patient Strengths: Physical Health  Special hobby/interest  Supportive family/friends   Treatment Modalities: Medication Management, Group therapy, Case management,  1 to 1 session with clinician, Psychoeducation, Recreational therapy.   Physician Treatment Plan for Primary Diagnosis: Severe manic bipolar 1 disorder with psychotic behavior (HCC) Long Term Goal(s):     Short Term Goals: Ability to identify changes in lifestyle to reduce recurrence of condition will improve Ability to verbalize feelings will improve Ability to disclose and discuss suicidal ideas  Medication Management: Evaluate patient's response, side effects, and tolerance of medication regimen.  Therapeutic Interventions: 1 to 1 sessions, Unit Group sessions and Medication administration.  Evaluation of Outcomes: Progressing  Physician Treatment Plan for Secondary Diagnosis: Principal Problem:   Severe manic bipolar 1 disorder with psychotic behavior (HCC)  Long Term Goal(s):     Short Term Goals: Ability to identify changes in lifestyle to reduce recurrence of condition will improve Ability to verbalize feelings will improve Ability to disclose and discuss suicidal ideas     Medication Management: Evaluate patient's response, side effects, and tolerance of medication regimen.  Therapeutic Interventions: 1 to 1 sessions, Unit Group sessions and Medication administration.  Evaluation of Outcomes: Progressing   RN Treatment Plan for Primary Diagnosis: Severe manic bipolar 1 disorder with psychotic behavior (HCC) Long Term Goal(s): Knowledge of disease and therapeutic regimen to maintain health will improve  Short Term Goals: Ability to remain free from injury will improve, Ability to verbalize frustration and anger appropriately will improve, Ability to demonstrate self-control, Ability to participate in decision making will improve, Ability to verbalize feelings will improve, and  Ability to disclose and discuss suicidal ideas  Medication Management: RN will administer medications as ordered by provider, will assess and evaluate patient's response and provide education to patient for prescribed medication. RN will report any adverse and/or side effects to prescribing provider.  Therapeutic Interventions: 1 on 1 counseling sessions, Psychoeducation, Medication administration, Evaluate responses to treatment, Monitor vital signs and CBGs as ordered, Perform/monitor CIWA, COWS, AIMS and Fall Risk screenings as ordered, Perform wound care treatments as ordered.  Evaluation of Outcomes: Progressing   LCSW Treatment Plan for Primary Diagnosis: Severe manic bipolar 1 disorder with psychotic behavior (HCC) Long Term Goal(s): Safe transition to appropriate next level of care at discharge, Engage patient in therapeutic group addressing interpersonal concerns.  Short Term Goals: Engage patient in aftercare planning with referrals and resources, Increase social support, Increase ability to appropriately verbalize feelings, Increase emotional regulation, and Increase  skills for wellness and recovery  Therapeutic Interventions: Assess for all discharge needs, 1 to 1 time with Social worker, Explore available resources and support systems, Assess for adequacy in community support network, Educate family and significant other(s) on suicide prevention, Complete Psychosocial Assessment, Interpersonal group therapy.  Evaluation of Outcomes: Progressing   Progress in Treatment: Attending groups: attended some groups Participating in groups: Yes. Taking medication as prescribed: Yes. Toleration medication: N/A Family/Significant other contact made: No will contact:  Jennifr Gaeta (mother) [phone] Patient understands diagnosis: No. Discussing patient identified problems/goals with staff: No. Medical problems stabilized or resolved: Yes. Denies suicidal/homicidal ideation:  Yes. Issues/concerns per patient self-inventory: No.   New problem(s) identified:  No   New Short Term/Long Term Goal(s):     medication stabilization, elimination of SI thoughts, development of comprehensive mental wellness plan.      Patient Goals:  I have a headache and need medical attention. I have a good therapist.     Discharge Plan or Barriers:  Patient recently admitted. CSW will continue to follow and assess for appropriate referrals and possible discharge planning.      Reason for Continuation of Hospitalization: Medication stabilization  Psychosis   Estimated Length of Stay:  4 - 6 days  Last 3 [place] Suicide Severity Risk Score: Flowsheet Row Ad[facility] (Current) [facility] Most recent reading at 05/13/2024  4:00 PM ED from 05/13/2024 in [facility] at [facility] Most recent reading at 05/13/2024  8:24 AM ED from 04/21/2024 in [facility] Department at [facility] Most recent reading at 04/22/2024  5:46 AM  C-SSRS RISK CATEGORY No Risk No Risk No Risk    Last PHQ 2/9 Scores:    09/21/2023    9:29 AM 01/14/2022    4:27 AM 09/15/2021   11:44 PM  Depression screen PHQ 2/9  Decreased Interest 1 0 0  Down, Depressed, Hopeless 1 1 1   PHQ - 2 Score 2 1 1   Altered sleeping  0 3  Tired, decreased energy  0 3  Change in appetite  0 3  Feeling bad or failure about yourself   1 2  Trouble concentrating  0 2  Moving slowly or fidgety/restless  0 0  Suicidal thoughts  0 0  PHQ-9 Score  2 14  Difficult doing work/chores  Not difficult at all Very difficult    Scribe for Treatment Team: Marci Polito M Sonam Wandel, ISRA[place] 05/23/2024 8:26 AM

## 2024-05-23 NOTE — [facility] Group Notes (Signed)
 Spirituality Group   Description: Participant directed exploration of values, beliefs and meaning   Following a brief framework of chaplain's role and ground rules of group behavior, participants are invited to share concerns or questions that engage spiritual life. Emphasis placed on common themes and shared experiences and ways to make meaning and clarify living into one's values.   Theory/Process/Goal: Utilize the theoretical framework of group therapy established by Celena Kite, Relational Cultural Theory and Rogerian approaches to facilitate relational empathy and use of the "here and now" to foster reflection, self-awareness, and sharing.   Observations: Stacy Poole initially came to participate in group but was emotionally activated and needed to leave. This chaplain provided follow up at length afterward.  Stacy Poole L. [name].Div
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
## 2024-05-23 NOTE — BHH Group Notes (Signed)
 Adult Psychoeducational Group Note  Date:  05/23/2024 Time:  7:43 PM  Group Topic/Focus:  Goals Group:   The focus of this group is to help patients establish daily goals to achieve during treatment and discuss how the patient can incorporate goal setting into their daily lives to aide in recovery. Orientation:   The focus of this group is to educate the patient on the purpose and policies of crisis stabilization and provide a format to answer questions about their admission.  The group details unit policies and expectations of patients while admitted.  Participation Level:  Did Not Attend  Participation Quality:    Affect:    Cognitive:    Insight:   Engagement in Group:    Modes of Intervention:    Additional Comments:    Jamelle Cassondra KIDD 05/23/2024, 7:43 PM

## 2024-05-23 NOTE — BHH Counselor (Signed)
 CSW met with patient to discuss benefits of having an ACT team. CSW answered patient's questions and informed her ACT team had planned to meet with her on 9/22 in the morning to discuss her needs. Patient reported she'd appreciate that and gave permission to schedule. CSW scheduled Genta from [facility] to meet with patient on 9/22 at 10am in conference room regarding starting ACT services. Providers made aware.  Raymie Giammarco, LCSWA 05/23/24 3:40am

## 2024-05-23 NOTE — Plan of Care (Signed)
  Problem: Education: Goal: Knowledge of [facility] Education information/materials will improve Outcome: Progressing Goal: Emotional status will improve Outcome: Progressing Goal: Mental status will improve Outcome: Progressing Goal: Verbalization of understanding the information provided will improve Outcome: Progressing   Problem: Activity: Goal: Interest or engagement in activities will improve Outcome: Progressing Goal: Sleeping patterns will improve Outcome: Progressing   Problem: Coping: Goal: Ability to verbalize frustrations and anger appropriately will improve Outcome: Progressing Goal: Ability to demonstrate self-control will improve Outcome: Progressing   Problem: Health Behavior/Discharge Planning: Goal: Identification of resources available to assist in meeting health care needs will improve Outcome: Progressing Goal: Compliance with treatment plan for underlying cause of condition will improve Outcome: Progressing   Problem: Physical Regulation: Goal: Ability to maintain clinical measurements within normal limits will improve Outcome: Progressing   Problem: Safety: Goal: Periods of time without injury will increase Outcome: Progressing   Problem: Education: Goal: Knowledge of [facility] Education information/materials will improve Outcome: Progressing Goal: Emotional status will improve Outcome: Progressing Goal: Mental status will improve Outcome: Progressing Goal: Verbalization of understanding the information provided will improve Outcome: Progressing   Problem: Activity: Goal: Interest or engagement in activities will improve Outcome: Progressing Goal: Sleeping patterns will improve Outcome: Progressing   Problem: Coping: Goal: Ability to verbalize frustrations and anger appropriately will improve Outcome: Progressing Goal: Ability to demonstrate self-control will improve Outcome: Progressing   Problem: Health  Behavior/Discharge Planning: Goal: Identification of resources available to assist in meeting health care needs will improve Outcome: Progressing Goal: Compliance with treatment plan for underlying cause of condition will improve Outcome: Progressing   Problem: Physical Regulation: Goal: Ability to maintain clinical measurements within normal limits will improve Outcome: Progressing   Problem: Safety: Goal: Periods of time without injury will increase Outcome: Progressing   Problem: Activity: Goal: Will identify at least one activity in which they can participate Outcome: Progressing   Problem: Coping: Goal: Ability to identify and develop effective coping behavior will improve Outcome: Progressing Goal: Ability to interact with others will improve Outcome: Progressing Goal: Demonstration of participation in decision-making regarding own care will improve Outcome: Progressing Goal: Ability to use eye contact when communicating with others will improve Outcome: Progressing   Problem: Health Behavior/Discharge Planning: Goal: Identification of resources available to assist in meeting health care needs will improve Outcome: Progressing   Problem: Self-Concept: Goal: Will verbalize positive feelings about self Outcome: Progressing   Problem: Activity: Goal: Will verbalize the importance of balancing activity with adequate rest periods Outcome: Progressing   Problem: Education: Goal: Will be free of psychotic symptoms Outcome: Progressing Goal: Knowledge of the prescribed therapeutic regimen will improve Outcome: Progressing   Problem: Coping: Goal: Coping ability will improve Outcome: Progressing Goal: Will verbalize feelings Outcome: Progressing   Problem: Health Behavior/Discharge Planning: Goal: Compliance with prescribed medication regimen will improve Outcome: Progressing   Problem: Nutritional: Goal: Ability to achieve adequate nutritional intake will  improve Outcome: Progressing   Problem: Role Relationship: Goal: Ability to communicate needs accurately will improve Outcome: Progressing Goal: Ability to interact with others will improve Outcome: Progressing   Problem: Safety: Goal: Ability to redirect hostility and anger into socially appropriate behaviors will improve Outcome: Progressing Goal: Ability to remain free from injury will improve Outcome: Progressing   Problem: Self-Care: Goal: Ability to participate in self-care as condition permits will improve Outcome: Progressing   Problem: Self-Concept: Goal: Will verbalize positive feelings about self Outcome: Progressing

## 2024-05-23 NOTE — Progress Notes (Signed)
 Pt visibly agitated and yelling in her room. Was just informed by Eleanor Piedmont, RN that [facility] gave pt agitation protocol at 0921am. Leita Arts, MD present on the unit and made aware of this information.

## 2024-05-23 NOTE — Progress Notes (Signed)
 The patient is awake and came out into the hallway requesting to receive a CT scan. She verbalized that she felt something pop in her head.

## 2024-05-23 NOTE — Progress Notes (Signed)
 Pt yelling and making verbally aggressive comments toward staff

## 2024-05-23 NOTE — Progress Notes (Signed)
 Patient approached nurse's station requesting haldol . It's the only thing that helps me with my ativan  withdrawals. Patient tearful, increasingly agitated, and labile. PO haldol  and benadryl  administered, per MAR. No adverse reactions noted. Patient remains safe at this time.
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
## 2024-05-23 NOTE — Progress Notes (Signed)
   05/23/24 2000  Psych Admission Type (Psych Patients Only)  Admission Status Voluntary  Psychosocial Assessment  Patient Complaints Anxiety;Irritability;Suspiciousness;Worrying  Eye Contact Fair  Facial Expression Animated  Affect Preoccupied  Speech Logical/coherent  Interaction Intrusive;Attention-seeking  Motor Activity Restless  Appearance/Hygiene Bizarre  Behavior Characteristics Intrusive;Irritable  Mood Labile  Aggressive Behavior  Effect No apparent injury  Thought Process  Coherency Flight of ideas;Disorganized;Circumstantial  Content Delusions;Preoccupation  Delusions Controlling;Grandeur  Perception WDL  Hallucination None reported or observed  Judgment Impaired  Confusion None  Danger to Self  Current suicidal ideation? Denies  Danger to Others  Danger to Others None reported or observed

## 2024-05-23 NOTE — [facility] Suicide Risk Assessment (Signed)
 [facility] INPATIENT:  Family/Significant Other Suicide Prevention Education  Suicide Prevention Education:  Education Completed; Fleeta Kunde (mother) [phone],  (name of family member/significant other) has been identified by the patient as the family member/significant other with whom the patient will be residing, and identified as the person(s) who will aid the patient in the event of a mental health crisis (suicidal ideations/suicide attempt).  With written consent from the patient, the family member/significant other has been provided the following suicide prevention education, prior to the and/or following the discharge of the patient.  Niels confirmed patient will not have access to firearms/guns/weapons after discharge. Niels reported she's willing and able to assist patient with safely storing and securing medications. Niels reported she's willing and able to provide support should patient have a mental health crisis. Niels denied having any safety concerns related to patient discharging home. Niels reported she's able to provide transportation Monday, 05/26/24 in the afternoon once patient's discharge has been approved.   The suicide prevention education provided includes the following: Suicide risk factors Suicide prevention and interventions National Suicide Hotline telephone number [facility] assessment telephone number [facility] Emergency Assistance 911 [facility] and/or Residential Mobile Crisis Unit telephone number  Request made of family/significant other to: Remove weapons (e.g., guns, rifles, knives), all items previously/currently identified as safety concern.   Remove drugs/medications (over-the-counter, prescriptions, illicit drugs), all items previously/currently identified as a safety concern.  The family member/significant other verbalizes understanding of the suicide prevention education information provided.  The family member/significant other agrees  to remove the items of safety concern listed above.  Lindsi Bayliss M Dalya Maselli, LCSWA 05/23/2024, 3:28 PM

## 2024-05-23 NOTE — Progress Notes (Signed)
   05/23/24 0738  Psych Admission Type (Psych Patients Only)  Admission Status Voluntary  Psychosocial Assessment  Patient Complaints Anxiety;Suspiciousness;Worrying;Restlessness  Eye Contact Fair  Facial Expression Animated  Affect Preoccupied;Anxious;Labile  Speech Logical/coherent  Interaction Intrusive;Attention-seeking  Motor Activity Fidgety;Restless  Appearance/Hygiene Bizarre  Behavior Characteristics Intrusive  Mood Anxious;Suspicious;Labile  Thought Chartered certified accountant of ideas;Disorganized;Circumstantial  Content Preoccupation;Delusions  Delusions Grandeur  Perception WDL  Hallucination None reported or observed  Judgment Impaired  Confusion None  Danger to Self  Current suicidal ideation? Denies  Agreement Not to Harm Self Yes  Description of Agreement verbal  Danger to Others  Danger to Others None reported or observed

## 2024-05-23 NOTE — Group Note (Signed)
 Recreation Therapy Group Note   Group Topic:Coping Skills  Group Date: 05/23/2024 Start Time: 1020 End Time: 1046 Facilitators: Tarrie Mcmichen-McCall, LRT,CTRS Location: [address] Day[address]   Group Topic: Coping Skills   Goal Area(s) Addresses: Patient will define what a coping skill is. Patient will work to create a list of healthy coping skills beginning with each letter of the alphabet. Patient will successfully identify positive coping skills they can use post d/c.  Patient will acknowledge benefit(s) of using learned coping skills post d/c.  Behavioral Response: Active, Delusional   Intervention: Group work   Activity: Coping A to Z. Patient asked to identify what a coping skill is and when they use them. Patients with Clinical research associate discussed healthy versus unhealthy coping skills. Next patients were given a blank worksheet titled Coping Skills A-Z. Partners were instructed to come up with at least one positive coping skill per letter of the alphabet. Patients were given 15 minutes to brainstorm before ideas were presented to the large group. Patients and LRT debriefed on the importance of coping skill selection based on situation and back-up plans when a skill tried is not effective. At the end of group, patients were given an handout of alphabetized strategies to keep for future reference.   Education: Pharmacologist, Scientist, physiological, Discharge Planning.    Education Outcome: Acknowledges education/Verbalizes understanding/In group clarification offered/Additional education needed    Affect/Mood: Incongruent   Participation Level: Active   Participation Quality: Independent   Behavior: Bizarre   Speech/Thought Process: Delusional   Insight: Fair   Judgement: Fair    Modes of Intervention: Worksheet   Patient Response to Interventions:  Interested    Education Outcome:  In group clarification offered    Clinical Observations/Individualized Feedback: Pt talked to  herself throughout group. Pt would distort her face into different expressions. Pt would make random comments that made no sense. At one point, pt stated she was [place] and formed a group called Non-movement with some of her peers in college. Pt explained they wouldn't do anything but sit around and smoke weed. Pt then started talking about cocaine production in the [place] . Pt was able to be redirected to give her coping skills. Pt was able to come up with art, bragging, crafts, digging for treasures, eating, gadgets, hitting pinatas, ice cream, joking and killing bugs.     Plan: Continue to engage patient in RT group sessions 2-3x/week.   Cristen Bredeson-McCall, LRT,CTRS 05/23/2024 12:52 PM

## 2024-05-24 MED ORDER — PANTOPRAZOLE SODIUM 40 MG PO TBEC
40.0000 mg | DELAYED_RELEASE_TABLET | Freq: Every day | ORAL | Status: DC
  Administered 2024-05-24 – 2024-05-26 (×3): 40 mg via ORAL
  Filled 2024-05-24 (×3): qty 1

## 2024-05-24 NOTE — Group Note (Addendum)
 LCSW Group Therapy Note  Group Date: 05/24/2024 Start Time: 1045 End Time: 1145   Type of Therapy and Topic:  Group Therapy: Using I Statements  Participation Level:  Active  Description of Group:  Patients were asked to provide details of some interpersonal conflicts they have experienced. Patients were then educated about "I" statements, communication which focuses on feelings or views of the speaker rather than what the other person is doing. The group members were asked to reflect on past conflicts and to provide specific examples for utilizing "I" statements.  Therapeutic Goals:  Patients will verbalize understanding of ineffective communication and effective communication. Patients will be able to empathize with whom they are having conflict. Patients will practice effective communication in the form of "I" statements.    Summary of Patient Progress:  Rhilynn shared her artistic abilities, creative energy, and desire to share art with others to help them cope. The patient was present and active throughout the session and proved open to feedback from CSW and peers. Patient demonstrated good insight into the subject matter, was respectful of peers, and was present throughout the entire session.  Keelyn's I statement to promote emotional wellness was I will go high, but not get high.  Therapeutic Modalities:   Cognitive Behavioral Therapy Solution-Focused Therapy    Hunter JONELLE Lever, LCSW 05/24/2024  5:22 PM

## 2024-05-24 NOTE — Progress Notes (Signed)
   05/24/24 0800  Psych Admission Type (Psych Patients Only)  Admission Status Voluntary  Psychosocial Assessment  Patient Complaints Anxiety;Worrying  Eye Contact Fair  Facial Expression Animated  Affect Anxious;Preoccupied  Primary school teacher;Attention-seeking  Motor Activity Restless  Appearance/Hygiene Bizarre  Behavior Characteristics Cooperative;Intrusive  Mood Anxious  Aggressive Behavior  Effect No apparent injury  Thought Chartered certified accountant of ideas;Tangential  Content Delusions;Preoccupation  Delusions Grandeur  Perception WDL  Hallucination None reported or observed  Judgment Impaired  Confusion None  Danger to Self  Current suicidal ideation? Denies  Danger to Others  Danger to Others None reported or observed

## 2024-05-24 NOTE — Group Note (Signed)
 Date:  05/24/2024 Time:  8:33 PM  Group Topic/Focus:  Wrap-Up Group:   The focus of this group is to help patients review their daily goal of treatment and discuss progress on daily workbooks.    Participation Level:  Did Not Attend   Stacy Poole 05/24/2024, 8:33 PM

## 2024-05-24 NOTE — Progress Notes (Signed)
 D. Pt continues to present tangential, labile at times, but has been able to engage in conversations. Pt has been visible in the milieu, observed interacting well with peers and staff- no behavioral issues noted. Pt reported sleeping well last night, described her appetite and concentration as 'good', and energy level as 'normal'.  Pt currently denies SI/HI and AVH and doesn't appear to be responding to internal stimuli. A. Labs and vitals monitored Pt supported emotionally and encouraged to express concerns and ask questions.   R. Pt remains safe with 15 minute checks. Will continue POC.

## 2024-05-24 NOTE — [facility] Group Notes (Signed)
 Adult Psychoeducational Group Note  Date:  05/24/2024 Time:  7:51 PM  Group Topic/Focus:  Goals Group:   The focus of this group is to help patients establish daily goals to achieve during treatment and discuss how the patient can incorporate goal setting into their daily lives to aide in recovery. Orientation:   The focus of this group is to educate the patient on the purpose and policies of crisis stabilization and provide a format to answer questions about their admission.  The group details unit policies and expectations of patients while admitted.  Participation Level:  Did Not Attend  Participation Quality:    Affect:    Cognitive:    Insight:   Engagement in Group:    Modes of Intervention:    Additional Comments:    Preethi Scantlebury O 05/24/2024, 7:51 PM

## 2024-05-24 NOTE — Progress Notes (Signed)
 [facility] MD Progress Note  05/24/2024 2:24 PM Stacy Poole  MRN:  [id number]  Principal Problem: Severe manic bipolar 1 disorder with psychotic behavior (HCC) Diagnosis: Principal Problem:   Severe manic bipolar 1 disorder with psychotic behavior (HCC)   Reason for Admission:  Stacy Poole is a 33 y.o. female  with a past psychiatric history of schizophrenia, schizoaffective disorder, bipolar type, bipolar disorder, schizophrenia, stimulant-induced psychosis, alcohol  use disorder in remission, cannabis use disorder, PTSD. Patient initially arrived to [facility] on 9/9 for manic episode with significant psychosis and paranoia, and admitted to [facility] on 9/9 for crisis stabilization. Of note, was recently discharged from [facility] and required IM Geodon  20 mg for severe agitation and irritability. PMHx is significant for abnormal uterine bleeding.  (admitted on 05/13/2024, total  LOS: 11 days )  Overnight events: VSS. Slept 5 hours. Verbally aggressive with staff yesterday evening. Given PO Haldol  5 mg x1, PO benadryl  50 mg x1 yesterday 1800.  Denies SI, HI, AVH. pt labile, but was redirectable. Pt is demanding and becomes fixated, when she does not have her way.   On interview:   She reports that she slept better last night and is thankful for improvements.  Tolerating regular diet without nausea and vomiting, however does not necessarily enjoy the food. Mood symptoms stable, denies any significant depression or anxiety currently.  Denies SI, HI, AVH.  Patient denies any intolerable side effects with Thorazine  after increase.  Reports that her medications are helping address her mania.  Ports that she feels more grounded currently and is hopeful to return back to college.  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 Plans to speak with her mother this morning once she awakens.  Reports some chronic reflux issues and  is amenable with trialing another medication to address the symptoms.  Collateral 05/23/2024  On conversation with mother Sheryl Saintil ([phone]): Patient is doing not well. Complaining about staff, other patients. Says there is a new patient on the unit. Didn't sleep well. Was very upset this AM. [place] that she was going to scratch the skin off of her face because she was so upset. Shortly afterwards felt that everything was fine after haldol  and conversation with this provider. Feeling threatened, scared and acting out. Says that female staff members who's looking at her in a sexual way. When calm pretty normal and closer to baseline. Had expected to see more improve, screaming at staff. Endorsed that she's going to hurt herself. Doing what she can to get herself out of here.   Past Psychiatric History: Current psychiatrist: Dr. Sable Current therapist: Unclear Previous psychiatric diagnoses: schizophrenia, schizoaffective disorder, bipolar type, bipolar disorder, schizophrenia, stimulant-induced psychosis, alcohol  use disorder in remission, cannabis use disorder, PTSD.  Current psychiatric medications: Vraylar  3.0 mg at bedtime, thorazine  100 mg at bedtime, doxazosin  1 mg at bedtime, mirtazapine  7.5 at bedtime, topamax  50 mg at bedtime. Psychiatric medication history/compliance: Recently on lexapro  20 mg but this medication was discontinued as patient attempted to overdose on it. Has been on Zyprexa  for a while per mom, but this stopped being effective. Previously trialed clozapine and risperdal  to no effect. Lamictal  and abilify.  Psychiatric hospitalization(s): This is her 21st hospitalization. Recently at [facility] in [place] for two weeks, discharged ~1 week ago. Has had 12+ hospitalizations over the last decade per chart review, for similar symptomatology. Psychotherapy history: Unclear Neuromodulation history:  History of suicide (obtained  from HPI): Attempted OD on lexapro   04/2024, has told others that it was an attempt to end her life History of homicide or aggression (obtained in HPI):    Substance Abuse History: Alcohol : Previous serious alcohol  use history, ETOH negative on admission, alcohol  withdrawal seizures Tobacco: Occasional previous use Cannabis: THC positive IV drug use: None Prescription drug use: None Other illicit drugs: None Rehab history: None   Past Medical History: PCP: None Medical diagnoses: Abnormal vaginal bleeding, diagnosed 09/2023 Medications: Tylenol  Allergies: Abilify (acne), carbamazepine  (suicidal?), Lamotrigine  (RASH), tetracycline (pseudotumor cerebri), pork products Hospitalizations: None Surgeries: None known  Trauma: 18 concussions from soccer Seizures:    LMP: 10 days ago Contraceptives: None known   Social History: Living situation: Alone Education: Nearly finished college 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 Yes.    Has the patient been a risk to self in the past 6 months? Yes.    Has the patient been a risk to self within the distant past? Yes.     Is the patient a risk to others? No.  Has the patient been a risk to others in the past 6 months? Yes.    Has the patient been a risk to others within the distant past? Yes.     Current Medications: Current Facility-Administered Medications  Medication Dose Route Frequency Provider Last Rate Last Admin   acetaminophen  (TYLENOL ) tablet 650 mg  650 mg Oral Q6H PRN Motley-Mangrum, Jadeka A, PMHNP   650 mg at 05/24/24 0226   alum & mag hydroxide-simeth (MAALOX/MYLANTA) 200-200-20 MG/5ML suspension 30 mL  30 mL Oral Q4H PRN Motley-Mangrum, Jadeka A,  PMHNP   30 mL at 05/24/24 0747   benztropine  (COGENTIN ) tablet 0.5 mg  0.5 mg Oral BID Rollene Katz, MD   0.5 mg at 05/24/24 9257   cariprazine  (VRAYLAR ) capsule 6 mg  6 mg Oral QPM Rollene Katz, MD   6 mg at 05/23/24 1806   chlorproMAZINE  (THORAZINE ) tablet 200 mg  200 mg Oral QHS Crawford, Benjamin, MD   200 mg at 05/23/24 2027   chlorproMAZINE  (THORAZINE ) tablet 50 mg  50 mg Oral Daily Rollene Katz, MD   50 mg at 05/24/24 9257   clonazePAM  (KLONOPIN ) tablet 0.5 mg  0.5 mg Oral TID PRN Towana Leita SAILOR, MD       clonazePAM  (KLONOPIN ) tablet 1 mg  1 mg Oral QHS Rollene Katz, MD   1 mg at 05/23/24 1949   haloperidol  (HALDOL ) tablet 5 mg  5 mg Oral TID PRN Motley-Mangrum, Jadeka A, PMHNP   5 mg at 05/23/24 1808   And   diphenhydrAMINE  (BENADRYL ) capsule 50 mg  50 mg Oral TID PRN Motley-Mangrum, Jadeka A, PMHNP   50 mg at 05/23/24 1808   haloperidol  lactate (HALDOL ) injection 5 mg  5 mg Intramuscular TID PRN Motley-Mangrum, Jadeka A, PMHNP       And   diphenhydrAMINE  (BENADRYL ) injection 50 mg  50 mg Intramuscular TID PRN Motley-Mangrum, Jadeka A, PMHNP       And   LORazepam  (ATIVAN ) injection 2 mg  2 mg Intramuscular TID PRN Motley-Mangrum, Jadeka A, PMHNP       haloperidol  lactate (HALDOL ) injection 10  mg  10 mg Intramuscular TID PRN Motley-Mangrum, Jadeka A, PMHNP   10 mg at 05/14/24 1044   And   diphenhydrAMINE  (BENADRYL ) injection 50 mg  50 mg Intramuscular TID PRN Motley-Mangrum, Jadeka A, PMHNP   50 mg at 05/14/24 1044   And   LORazepam  (ATIVAN ) injection 2 mg  2 mg Intramuscular TID PRN Motley-Mangrum, Jadeka A, PMHNP   2 mg at 05/14/24 1043   doxazosin  (CARDURA ) tablet 1 mg  1 mg Oral QHS Rollene Katz, MD   1 mg at 05/23/24 2029   magnesium  hydroxide (MILK OF MAGNESIA) suspension 30 mL  30 mL Oral Daily PRN Motley-Mangrum, Jadeka A, PMHNP   30 mL at 05/20/24 1805   mirtazapine  (REMERON ) tablet 7.5 mg  7.5 mg Oral QHS Motley-Mangrum, Jadeka A, PMHNP   7.5 mg  at 05/23/24 2028   pantoprazole  (PROTONIX ) EC tablet 40 mg  40 mg Oral Daily Lenard Calin, MD   40 mg at 05/24/24 1041   topiramate  (TOPAMAX ) tablet 50 mg  50 mg Oral BID Motley-Mangrum, Jadeka A, PMHNP   50 mg at 05/24/24 9257    Lab Results:  No results found for this or any previous visit (from the past 48 hours).   Blood Alcohol  level:  Lab Results  Component Value Date   [facility] <15 05/13/2024   ETH <15 04/21/2024    Metabolic Labs: Lab Results  Component Value Date   HGBA1C 4.9 05/15/2024   MPG 93.93 05/15/2024   MPG 91.06 09/02/2021   Lab Results  Component Value Date   PROLACTIN 11.5 09/21/2023   Lab Results  Component Value Date   CHOL 151 05/15/2024   TRIG 56 05/15/2024   HDL 60 05/15/2024   CHOLHDL 2.5 05/15/2024   VLDL 11 05/15/2024   LDLCALC 80 05/15/2024   LDLCALC 78 09/02/2021    Physical Findings: AIMS: No  CIWA:    COWS:     Psychiatric Specialty Exam:  Presentation   General Appearance: Appropriate for Environment; Casual  Eye Contact: Good  Speech: -- (fast, but not pressured)  Speech Volume: Normal  Handedness: Right   Mood and Affect   Mood: Euthymic  Affect: Congruent; Full Range; Appropriate   Thinking   Thought Processes: Linear  Descriptions of Associations: Intact  Orientation: Full (Time, Place and Person)  Thought Content: Logical  History of Schizophrenia/Schizoaffective disorder: No   Duration of Psychotic Symptoms: >1 month  Hallucinations: None  Ideas of Reference: None  Suicidal Thoughts: No  Homicidal Thoughts: No   Sensorium    Memory: Immediate Fair  Judgment: Intact  Insight: Shallow   Executive Functions    Concentration: Fair  Attention Span: Fair  Recall: Fair  Fund of Knowledge: Fair  Language: Fair   Psychomotor Activity: Normal    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  Pulmonary:     Effort: Pulmonary effort is normal.  Neurological:     Mental Status: She is alert and oriented to person, place, and time.    Review of Systems  Constitutional:  Negative for fever.  Respiratory:  Negative for shortness of breath.   Cardiovascular:  Negative for chest pain.  Gastrointestinal:  Positive for heartburn. Negative for abdominal pain, constipation and diarrhea.  Psychiatric/Behavioral:  Negative for depression, hallucinations, substance abuse and suicidal ideas. The patient does not have insomnia.    Blood  pressure 120/78, pulse 80, temperature 97.7 F (36.5 C), temperature source Oral, resp. rate 20, height 5' 4 (1.626 m), weight 65.9 kg, SpO2 100%. Body mass index is 24.92 kg/m.   Treatment Plan Summary: Daily contact with patient to assess and evaluate symptoms and progress in treatment and Medication management     ASSESSMENT:    Chastin Garlitz is a 33 year old female who presents in an acute mixed episode (Bipolar 1 disorder, current episode mixed vs schizoaffective disorder, bipolar type, current episode mixed). Currently presents with multiple somatic symptoms but is decompensating, delusional, almost word-salad. Very paranoid and grandiose -- appears extraordinarily mentally ill. Has needed IM agitation medications twice in two days. Last month it appears she swallowed the entirety of her Lexapro , leading to previous hospitalization. Extreme threat to self, possibly others considering irritability and extent of delusions. This is one of many admissions for her, with increasing frequency this year. Will push as hard as possible for long-acting injectible formulation and/or ACT team. Only way patient has a chance of maintaining independent living is with these measures -- patient understandably desires this independence. Will have this discussion after she  regains insight. Expect longer hospital stay will be necessary for this patient. Will start Vraylar  4.5 mg at bedtime as mother reports good response to this medication and substitute prolixin  for thorazine  with eye towards LAI. Should we not see improvement this week, will consider switching from Vraylar  to Risperdal . Currently voluntary, will initiate IVC proceedings should patient sign 72 hour form.   Up to date assessment:  Patient less paranoid regarding staff and other patients on the unit. Discussion still has delusional content but can largely discuss medications. Patient currently here voluntarily and does not meet criteria for IVC threat to self or others at this time. Mother is not available until after 10 am Monday, but is aware that patient may discharge then. Continue Thorazine  at 50 mg qam + 200 qPM.     Diagnoses / Active Problems: Bipolar I disorder, current episode mixed vs schizoaffective disorder, bipolar type, current episode mixed Cannabis use disorder   PLAN:   Safety and Monitoring: -  VOLUNTARY  admission to inpatient psychiatric unit for safety, stabilization and treatment. - Daily contact with patient to assess and evaluate symptoms and progress in treatment - Patient's case to be discussed in multi-disciplinary team meeting -  Observation Level : q15 minute checks -  Vital signs:  q12 hours -  Precautions: suicide, elopement, and assault   2. Psychiatric Diagnoses and Treatment:     # Schizoaffective disorder, bipolar type, in manic episode # Cannabis use disorder - Continue vraylar  6 mg at bedtime for mania/psychosis. - Continue Thorazine  50 mg qAM + 150 mg at bedtime --> 50 mg qam + 200 mg at bedtime for psychosis.  - Continue Cogentin  0.5 mg BID for EPS prophylaxis - Continue Klonopin  1 mg at bedtime for agitation.  - Switch Ativan  1 mg --> Klonopin  0.5 mg TID PRN for agitiation.  - The risks/benefits/side-effects/alternatives to this medication were  discussed in detail with the patient and time was given for questions. The patient consents to medication trial.  - Metabolic profile and EKG monitoring obtained while on an atypical antipsychotic  BMI: 24.92 TSH: WNL Lipid panel: WNL HbgA1c: 4.9 NML QTc: 439 - Encouraged patient to participate in unit milieu and in scheduled group therapies  - Short Term Goals: Ability to identify changes in lifestyle to reduce recurrence of condition will improve, Ability to  verbalize feelings will improve, and Ability to disclose and discuss suicidal ideas - Long Term Goals: Improvement in symptoms so as ready for discharge   Other PRNS: Agitation, anxiety, sleep, GI complaints   Other labs reviewed on admission: + THC               3. Medical Issues Being Addressed:    # Abnormal vaginal bleeding - Workup 09/2023, will need follow-up with GYN, pap negative  #GERD  -- Start Protonix  40 mg daily    4. Discharge Planning:    - Estimated discharge date: 3-4 days - Social work and case management to assist with discharge planning and identification of hospital follow-up needs prior to discharge. - Discharge concerns: Need to establish a safety plan; medication compliance and effectiveness. - Discharge goals: Return home with outpatient referrals for mental health follow-up including medication management/psychotherapy.   I certify that inpatient services furnished can reasonably be expected to improve the patient's condition.     Signed: Odis Cleveland, MD [facility] Physician, PGY-2 05/24/2024 2:24 PM

## 2024-05-24 NOTE — Progress Notes (Signed)
(  Sleep Hours) -5 (Any PRNs that were needed, meds refused, or side effects to meds)-  (Any disturbances and when (visitation, over night)-pt became labile, but was redirectable. Pt is demanding and becomes fixated, when she does not have her way. (Concerns raised by the patient)- none (SI/HI/AVH)-Denied

## 2024-05-24 NOTE — Plan of Care (Signed)
   Problem: Education: Goal: Emotional status will improve Outcome: Progressing Goal: Mental status will improve Outcome: Progressing   Problem: Activity: Goal: Interest or engagement in activities will improve Outcome: Progressing

## 2024-05-24 NOTE — Progress Notes (Signed)
   05/24/24 2324  Psych Admission Type (Psych Patients Only)  Admission Status Voluntary  Psychosocial Assessment  Patient Complaints Anxiety;Irritability  Eye Contact Fair  Facial Expression Animated  Affect Preoccupied  Speech Logical/coherent  Interaction Attention-seeking  Motor Activity Restless  Appearance/Hygiene Bizarre  Behavior Characteristics Agitated  Mood Anxious  Aggressive Behavior  Effect No apparent injury  Thought Chartered certified accountant of ideas;Disorganized  Content Delusions;Preoccupation  Delusions Grandeur  Perception WDL  Hallucination None reported or observed  Judgment Impaired  Confusion None  Danger to Self  Current suicidal ideation? Denies  Danger to Others  Danger to Others None reported or observed

## 2024-05-25 NOTE — Progress Notes (Signed)
 D. Pt has been appropriate on the unit-  no behavioral issues noted. Pt reported sleeping well last night, described her appetite and concentration as 'good', and energy level as normal. Per pt's self inventory, pt rated her depression,hopelessness and anxiety all 0's today. Pt currently denies SI/HI and AVH and doesn't appear to be responding to internal stimuli.  A. Labs and vitals monitored. Pt supported emotionally and encouraged to express concerns and ask questions.   R. Pt remains safe with 15 minute checks. Will continue POC.

## 2024-05-25 NOTE — Progress Notes (Signed)
 [facility] MD Progress Note  05/25/2024 9:00 AM JOZLYN SCHATZ  MRN:  [id number]  Principal Problem: Severe manic bipolar 1 disorder with psychotic behavior (HCC) Diagnosis: Principal Problem:   Severe manic bipolar 1 disorder with psychotic behavior (HCC)   Reason for Admission:  CAMELLA SEIM is a 33 y.o. female  with a past psychiatric history of schizophrenia, schizoaffective disorder, bipolar type, bipolar disorder, schizophrenia, stimulant-induced psychosis, alcohol  use disorder in remission, cannabis use disorder, PTSD. Patient initially arrived to [facility] on 9/9 for manic episode with significant psychosis and paranoia, and admitted to [facility] on 9/9 for crisis stabilization. Of note, was recently discharged from [facility] and required IM Geodon  20 mg for severe agitation and irritability. PMHx is significant for abnormal uterine bleeding.  (admitted on 05/13/2024, total  LOS: 12 days )  Overnight events: VSS. Slept 3hours. PRN: Maalox, MOM and tylenol . Denies SI, HI, AVH. -Pt was argumentative at start of shift, but was redirectable. Pt later apologized and stated she has financial stressors which are triggering  her.    On interview:   Patient continues to report adequate sleep.  Mood symptoms stable, denies any significant depression or anxiety currently.  Denies SI, HI, AVH.    Report she feels more grounded currently and is inspired to pursuit her art dreams.  Patient reports she anticipates discharge tomorrow after ACT team meeting.  She is motivated to continue taking medications and communicates the need to remain compliant on medications to control schizoaffective symptoms.  Patient reports heartburn is well-controlled currently on Protonix .  She denies having a primary care physician to follow-up about medications for other medical conditions.  Collateral 05/23/2024  On conversation with mother Jozalynn Noyce ([phone]): Patient is doing not well. Complaining about staff,  other patients. Says there is a new patient on the unit. Didn't sleep well. Was very upset this AM. [place] that she was going to scratch the skin off of her face because she was so upset. Shortly afterwards felt that everything was fine after haldol  and conversation with this provider. Feeling threatened, scared and acting out. Says that female staff members who's looking at her in a sexual way. When calm pretty normal and closer to baseline. Had expected to see more improve, screaming at staff. Endorsed that she's going to hurt herself. Doing what she can to get herself out of here.   Past Psychiatric History: Current psychiatrist: Dr. Sable Current therapist: Unclear Previous psychiatric diagnoses: schizophrenia, schizoaffective disorder, bipolar type, bipolar disorder, schizophrenia, stimulant-induced psychosis, alcohol  use disorder in remission, cannabis use disorder, PTSD.  Current psychiatric medications: Vraylar  3.0 mg at bedtime, thorazine  100 mg at bedtime, doxazosin  1 mg at bedtime, mirtazapine  7.5 at bedtime, topamax  50 mg at bedtime. Psychiatric medication history/compliance: Recently on lexapro  20 mg but this medication was discontinued as patient attempted to overdose on it. Has been on Zyprexa  for a while per mom, but this stopped being effective. Previously trialed clozapine and risperdal  to no effect. Lamictal  and abilify.  Psychiatric hospitalization(s): This is her 21st hospitalization. Recently at [facility] in [place] for two weeks, discharged ~1 week ago. Has had 12+ hospitalizations over the last decade per chart review, for similar symptomatology. Psychotherapy history: Unclear Neuromodulation history:  History of suicide (obtained from HPI): Attempted OD on lexapro  04/2024, has told others that it was an attempt to end her life History of homicide or aggression (obtained in HPI):    Substance Abuse History: Alcohol : Previous serious alcohol  use history, ETOH  negative on  admission, alcohol  withdrawal seizures Tobacco: Occasional previous use Cannabis: THC positive IV drug use: None Prescription drug use: None Other illicit drugs: None Rehab history: None   Past Medical History: PCP: None Medical diagnoses: Abnormal vaginal bleeding, diagnosed 09/2023 Medications: Tylenol  Allergies: Abilify (acne), carbamazepine  (suicidal?), Lamotrigine  (RASH), tetracycline (pseudotumor cerebri), pork products Hospitalizations: None Surgeries: None known  Trauma: 18 concussions from soccer Seizures:    LMP: 10 days ago Contraceptives: None known   Social History: Living situation: Alone Education: Nearly finished college 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 Yes.    Has the patient been a risk to self in the past 6 months? Yes.    Has the patient been a risk to self within the distant past? Yes.     Is the patient a risk to others? No.  Has the patient been a risk to others in the past 6 months? Yes.    Has the patient been a risk to others within the distant past? Yes.     Current Medications: Current Facility-Administered Medications  Medication Dose Route Frequency Provider Last Rate Last Admin   acetaminophen  (TYLENOL ) tablet 650 mg  650 mg Oral Q6H PRN Motley-Mangrum, Jadeka A, PMHNP   650 mg at 05/25/24 0038   alum & mag hydroxide-simeth (MAALOX/MYLANTA) 200-200-20 MG/5ML suspension 30 mL  30 mL Oral Q4H PRN Motley-Mangrum, Jadeka A, PMHNP   30 mL at 05/25/24 9472   benztropine  (COGENTIN ) tablet 0.5 mg  0.5 mg Oral BID Rollene Katz, MD   0.5 mg at 05/25/24 0745   cariprazine  (VRAYLAR ) capsule 6 mg  6 mg Oral QPM Rollene Katz, MD    6 mg at 05/24/24 1806   chlorproMAZINE  (THORAZINE ) tablet 200 mg  200 mg Oral QHS Crawford, Benjamin, MD   200 mg at 05/24/24 2040   chlorproMAZINE  (THORAZINE ) tablet 50 mg  50 mg Oral Daily Rollene Katz, MD   50 mg at 05/25/24 9255   clonazePAM  (KLONOPIN ) tablet 0.5 mg  0.5 mg Oral TID PRN Butler, Laura N, MD       clonazePAM  (KLONOPIN ) tablet 1 mg  1 mg Oral QHS Rollene Katz, MD   1 mg at 05/24/24 1954   haloperidol  (HALDOL ) tablet 5 mg  5 mg Oral TID PRN Motley-Mangrum, Jadeka A, PMHNP   5 mg at 05/23/24 1808   And   diphenhydrAMINE  (BENADRYL ) capsule 50 mg  50 mg Oral TID PRN Motley-Mangrum, Jadeka A, PMHNP   50 mg at 05/23/24 1808   haloperidol  lactate (HALDOL ) injection 5 mg  5 mg Intramuscular TID PRN Motley-Mangrum, Jadeka A, PMHNP       And   diphenhydrAMINE  (BENADRYL ) injection 50 mg  50 mg Intramuscular TID PRN Motley-Mangrum, Jadeka A, PMHNP       And   LORazepam  (ATIVAN ) injection 2 mg  2 mg Intramuscular TID PRN Motley-Mangrum, Jadeka A, PMHNP       haloperidol  lactate (HALDOL ) injection 10 mg  10 mg Intramuscular TID PRN Motley-Mangrum, Jadeka A, PMHNP   10 mg at 05/14/24 1044   And   diphenhydrAMINE  (BENADRYL ) injection 50 mg  50 mg Intramuscular TID PRN Motley-Mangrum, Jadeka A, PMHNP  50 mg at 05/14/24 1044   And   LORazepam  (ATIVAN ) injection 2 mg  2 mg Intramuscular TID PRN Motley-Mangrum, Jadeka A, PMHNP   2 mg at 05/14/24 1043   doxazosin  (CARDURA ) tablet 1 mg  1 mg Oral QHS Rollene Katz, MD   1 mg at 05/24/24 2040   magnesium  hydroxide (MILK OF MAGNESIA) suspension 30 mL  30 mL Oral Daily PRN Motley-Mangrum, Jadeka A, PMHNP   30 mL at 05/25/24 0746   mirtazapine  (REMERON ) tablet 7.5 mg  7.5 mg Oral QHS Motley-Mangrum, Jadeka A, PMHNP   7.5 mg at 05/24/24 2041   pantoprazole  (PROTONIX ) EC tablet 40 mg  40 mg Oral Daily Lenard Calin, MD   40 mg at 05/25/24 0650   topiramate  (TOPAMAX ) tablet 50 mg  50 mg Oral BID Motley-Mangrum, Jadeka A, PMHNP   50 mg  at 05/25/24 0745    Lab Results:  No results found for this or any previous visit (from the past 48 hours).   Blood Alcohol  level:  Lab Results  Component Value Date   [facility] <15 05/13/2024   ETH <15 04/21/2024    Metabolic Labs: Lab Results  Component Value Date   HGBA1C 4.9 05/15/2024   MPG 93.93 05/15/2024   MPG 91.06 09/02/2021   Lab Results  Component Value Date   PROLACTIN 11.5 09/21/2023   Lab Results  Component Value Date   CHOL 151 05/15/2024   TRIG 56 05/15/2024   HDL 60 05/15/2024   CHOLHDL 2.5 05/15/2024   VLDL 11 05/15/2024   LDLCALC 80 05/15/2024   LDLCALC 78 09/02/2021    Physical Findings: AIMS: No  CIWA:    COWS:     Psychiatric Specialty Exam:  Presentation   General Appearance: Appropriate for Environment; Casual  Eye Contact: Good  Speech: -- (fast, but not pressured)  Speech Volume: Normal  Handedness: Right   Mood and Affect   Mood: Euthymic  Affect: Congruent; Full Range; Appropriate   Thinking   Thought Processes: Linear  Descriptions of Associations: Intact  Orientation: Full (Time, Place and Person)  Thought Content: Logical  History of Schizophrenia/Schizoaffective disorder: No   Duration of Psychotic Symptoms: >1 month  Hallucinations: None  Ideas of Reference: None  Suicidal Thoughts: No  Homicidal Thoughts: No   Sensorium    Memory: Immediate Fair  Judgment: Intact  Insight: Shallow   Executive Functions    Concentration: Fair  Attention Span: Fair  Recall: Fair  Fund of Knowledge: Fair  Language: Fair   Psychomotor Activity: Normal    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  Pulmonary:     Effort: Pulmonary effort is normal.  Neurological:     Mental Status: She is alert and oriented to person,  place, and time.    Review of Systems  Constitutional:  Negative for fever.  Respiratory:  Negative for shortness of breath.   Cardiovascular:  Negative for chest pain.  Gastrointestinal:  Positive for heartburn. Negative for abdominal pain, constipation and diarrhea.  Psychiatric/Behavioral:  Negative for depression, hallucinations, substance abuse and suicidal ideas. The patient does not have insomnia.    Blood pressure 122/82, pulse 69, temperature (!) 97.3 F (36.3 C), temperature source Oral, resp. rate 20, height 5' 4 (1.626 m), weight 65.9 kg, SpO2 99%. Body mass index is 24.92 kg/m.   Treatment Plan Summary: Daily contact with  patient to assess and evaluate symptoms and progress in treatment and Medication management     ASSESSMENT:    Dyamon Sosinski is a 33 year old female who presents in an acute mixed episode (Bipolar 1 disorder, current episode mixed vs schizoaffective disorder, bipolar type, current episode mixed). Currently presents with multiple somatic symptoms but is decompensating, delusional, almost word-salad. Very paranoid and grandiose -- appears extraordinarily mentally ill. Has needed IM agitation medications twice in two days. Last month it appears she swallowed the entirety of her Lexapro , leading to previous hospitalization. Extreme threat to self, possibly others considering irritability and extent of delusions. This is one of many admissions for her, with increasing frequency this year. Will push as hard as possible for long-acting injectible formulation and/or ACT team. Only way patient has a chance of maintaining independent living is with these measures -- patient understandably desires this independence. Will have this discussion after she regains insight. Expect longer hospital stay will be necessary for this patient. Will start Vraylar  4.5 mg at bedtime as mother reports good response to this medication and substitute prolixin  for thorazine  with eye towards  LAI. Should we not see improvement this week, will consider switching from Vraylar  to Risperdal . Currently voluntary, will initiate IVC proceedings should patient sign 72 hour form.   Up to date assessment:   Patient continues to be less irritable and labile on the unit.  Intermittent moments of grandiose delusions, however medication seems to be working appropriately and patient notes improvements overall.  Patient is here voluntarily and continues to not require necessity for IVC.  Overall she is not a danger to herself or others at the current moment.  Anticipate discharge tomorrow after meeting with ACT team. Mother is not available until after 10 am Monday, but is aware that patient may discharge then. Continue Thorazine  at 50 mg qam + 200 qPM.     Diagnoses / Active Problems: Bipolar I disorder, current episode mixed vs schizoaffective disorder, bipolar type, current episode mixed Cannabis use disorder   PLAN:   Safety and Monitoring: -  VOLUNTARY  admission to inpatient psychiatric unit for safety, stabilization and treatment. - Daily contact with patient to assess and evaluate symptoms and progress in treatment - Patient's case to be discussed in multi-disciplinary team meeting -  Observation Level : q15 minute checks -  Vital signs:  q12 hours -  Precautions: suicide, elopement, and assault   2. Psychiatric Diagnoses and Treatment:     # Schizoaffective disorder, bipolar type, in manic episode # Cannabis use disorder - Continue vraylar  6 mg at bedtime for mania/psychosis. - Continue Thorazine  50 mg qAM + 150 mg at bedtime --> 50 mg qam + 200 mg at bedtime for psychosis.  - Continue Cogentin  0.5 mg BID for EPS prophylaxis - Continue Klonopin  1 mg at bedtime for agitation.  - Switch Ativan  1 mg --> Klonopin  0.5 mg TID PRN for agitiation.  - The risks/benefits/side-effects/alternatives to this medication were discussed in detail with the patient and time was given for questions.  The patient consents to medication trial.  - Metabolic profile and EKG monitoring obtained while on an atypical antipsychotic  BMI: 24.92 TSH: WNL Lipid panel: WNL HbgA1c: 4.9 NML QTc: 439 - Encouraged patient to participate in unit milieu and in scheduled group therapies  - Short Term Goals: Ability to identify changes in lifestyle to reduce recurrence of condition will improve, Ability to verbalize feelings will improve, and Ability to disclose and discuss suicidal ideas -  Long Term Goals: Improvement in symptoms so as ready for discharge   Other PRNS: Agitation, anxiety, sleep, GI complaints   Other labs reviewed on admission: + THC               3. Medical Issues Being Addressed:    # Abnormal vaginal bleeding - Workup 09/2023, will need follow-up with GYN, pap negative  #GERD  -- Continue Protonix  40 mg daily    4. Discharge Planning:    - Estimated discharge date: 9/22 - Social work and case management to assist with discharge planning and identification of hospital follow-up needs prior to discharge. - Discharge concerns: Need to establish a safety plan; medication compliance and effectiveness. - Discharge goals: Return home with outpatient referrals for mental health follow-up including medication management/psychotherapy.   I certify that inpatient services furnished can reasonably be expected to improve the patient's condition.     Signed: Wataru Mccowen, MD [facility] Physician, PGY-2 05/25/2024 9:00 AM

## 2024-05-25 NOTE — Progress Notes (Signed)
(  Sleep Hours) -3 (Any PRNs that were needed, meds refused, or side effects to meds)- Maalox and Tylenol  (Any disturbances and when (visitation, over night)-Pt was argumentative at start of shift, but was redirectable. Pt later apologized and stated she has financial  stressors which are triggering  her. (Concerns raised by the patient)- none (SI/HI/AVH)-Denied

## 2024-05-25 NOTE — Plan of Care (Signed)
   Problem: Education: Goal: Emotional status will improve Outcome: Progressing Goal: Mental status will improve Outcome: Progressing   Problem: Activity: Goal: Interest or engagement in activities will improve Outcome: Progressing

## 2024-05-25 NOTE — Progress Notes (Signed)
   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 Denies  Danger to Others  Danger to Others None reported or observed

## 2024-05-25 NOTE — Group Note (Unsigned)
 Date:  05/25/2024 Time:  8:30 PM  Group Topic/Focus:  Wrap-Up Group:   The focus of this group is to help patients review their daily goal of treatment and discuss progress on daily workbooks.     Participation Level:  {BHH PARTICIPATION OZCZO:77735}  Participation Quality:  {BHH PARTICIPATION QUALITY:22265}  Affect:  {BHH AFFECT:22266}  Cognitive:  {BHH COGNITIVE:22267}  Insight: {BHH Insight2:20797}  Engagement in Group:  {BHH ENGAGEMENT IN HMNLE:77731}  Modes of Intervention:  {BHH MODES OF INTERVENTION:22269}  Additional Comments:  ***  Gwenn Nobie [place] 05/25/2024, 8:30 PM

## 2024-05-25 NOTE — Group Note (Unsigned)
 Date:  05/25/2024 Time:  8:51 PM  Group Topic/Focus:  Wrap-Up Group:   The focus of this group is to help patients review their daily goal of treatment and discuss progress on daily workbooks.     Participation Level:  {BHH PARTICIPATION OZCZO:77735}  Participation Quality:  {BHH PARTICIPATION QUALITY:22265}  Affect:  {BHH AFFECT:22266}  Cognitive:  {BHH COGNITIVE:22267}  Insight: {BHH Insight2:20797}  Engagement in Group:  {BHH ENGAGEMENT IN HMNLE:77731}  Modes of Intervention:  {BHH MODES OF INTERVENTION:22269}  Additional Comments:  ***  Stephane Niemann A Prisha Hiley 05/25/2024, 8:51 PM

## 2024-05-25 NOTE — Group Note (Signed)
 Date:  05/25/2024 Time:  8:32 PM  Group Topic/Focus:  Wrap-Up Group:   The focus of this group is to help patients review their daily goal of treatment and discuss progress on daily workbooks.    Participation Level:  Active  Participation Quality:  Appropriate  Affect:  Appropriate  Cognitive:  Appropriate  Insight: Appropriate  Engagement in Group:  Engaged  Modes of Intervention:  Education and Exploration  Additional Comments:  Patient attended and participated in group tonight.  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  She would like to learn how to choose her battles. She takes on all the small things.  Gwenn Chillington Dacosta 05/25/2024, 8:32 PM

## 2024-05-26 ENCOUNTER — Other Ambulatory Visit: Payer: Self-pay

## 2024-05-26 ENCOUNTER — Other Ambulatory Visit (HOSPITAL_COMMUNITY): Payer: Self-pay

## 2024-05-26 MED ORDER — CHLORPROMAZINE HCL 50 MG PO TABS
50.0000 mg | ORAL_TABLET | Freq: Every day | ORAL | 0 refills | Status: DC
  Filled 2024-05-26: qty 30, 30d supply, fill #0

## 2024-05-26 MED ORDER — MIRTAZAPINE 7.5 MG PO TABS
7.5000 mg | ORAL_TABLET | Freq: Every day | ORAL | 0 refills | Status: DC
  Filled 2024-05-26: qty 30, 30d supply, fill #0

## 2024-05-26 MED ORDER — BENZTROPINE MESYLATE 0.5 MG PO TABS
0.5000 mg | ORAL_TABLET | Freq: Two times a day (BID) | ORAL | 0 refills | Status: DC
  Filled 2024-05-26: qty 60, 30d supply, fill #0

## 2024-05-26 MED ORDER — CARIPRAZINE HCL 6 MG PO CAPS
6.0000 mg | ORAL_CAPSULE | Freq: Every evening | ORAL | 0 refills | Status: DC
  Filled 2024-05-26: qty 30, 30d supply, fill #0

## 2024-05-26 MED ORDER — TOPIRAMATE 50 MG PO TABS
50.0000 mg | ORAL_TABLET | Freq: Two times a day (BID) | ORAL | 0 refills | Status: AC
  Filled 2024-05-26: qty 60, 30d supply, fill #0

## 2024-05-26 MED ORDER — CLONAZEPAM 1 MG PO TABS
1.0000 mg | ORAL_TABLET | Freq: Every day | ORAL | 0 refills | Status: DC
  Filled 2024-05-26: qty 30, 30d supply, fill #0

## 2024-05-26 MED ORDER — PANTOPRAZOLE SODIUM 40 MG PO TBEC
40.0000 mg | DELAYED_RELEASE_TABLET | Freq: Every day | ORAL | 0 refills | Status: DC
  Filled 2024-05-26: qty 30, 30d supply, fill #0

## 2024-05-26 MED ORDER — CHLORPROMAZINE HCL 200 MG PO TABS
200.0000 mg | ORAL_TABLET | Freq: Every day | ORAL | 0 refills | Status: DC
  Filled 2024-05-26: qty 30, 30d supply, fill #0

## 2024-05-26 NOTE — Group Note (Signed)
 Recreation Therapy Group Note   Group Topic:Personal Development  Group Date: 05/26/2024 Start Time: 1016 End Time: 1035 Facilitators: Desirai Traxler-McCall, LRT,CTRS Location: [address] Dayroom   Group Topic: Personal Development  Goal Area(s) Addresses:  Patient will successfully define what peace to them. Patient will successfully identify benefit of finding peace. Patient will successfully identify how peace impacts their lives.    Behavioral Response:   Intervention: Worksheet  Activity: LRT and patients discussed what peace is. Patients were then given a worksheet were they had to identify how peace and it's impact has affected their lives. Patients were to address things such as change, relationships, goals, etc.   Education: Geophysicist/field seismologist  Education Outcome: Acknowledge education, In group clarification offered, Needs additional education   Affect/Mood: N/A   Participation Level: Did not attend    Clinical Observations/Individualized Feedback:      Plan: Continue to engage patient in RT group sessions 2-3x/week.   Crystalyn Delia-McCall, LRT,CTRS 05/26/2024 12:10 PM

## 2024-05-26 NOTE — Progress Notes (Addendum)
 [facility]  D/C'd Home per MD order.  Discussed with the patient and all questions fully answered.  An After Visit Summary was printed and given to the patient. Patient received prescription.  D/c education completed with patient  and mother including follow up instructions, medication list, d/c activities limitations if indicated, with other d/c instructions as indicated by MD - patient able to verbalize understanding, all questions fully answered.   Patient instructed to return to ED, call 911, or call MD for any changes in condition.   Patient escorted to the main entrance, and D/C home via private auto.  [name] Doing 05/26/2024 3:04 PM

## 2024-05-26 NOTE — Progress Notes (Signed)
(  Sleep Hours) - 4.25 (Any PRNs that were needed, meds refused, or side effects to meds)- PRN tylenol  650 mg given at pt request, no meds refused.  (Any disturbances and when (visitation, over night)- None (Concerns raised by the patient)- None  (SI/HI/AVH)- Denies SI/HI/AVH.

## 2024-05-26 NOTE — Discharge Instructions (Signed)
-  Follow-up with your outpatient psychiatric provider -instructions on appointment date, time, and address (location) are provided to you in discharge paperwork.   -Take your psychiatric medications as prescribed at discharge - instructions are provided to you in the discharge paperwork   -Follow-up with outpatient primary care doctor and other specialists -for management of preventative medicine and chronic medical disease, including: abnormal vaginal bleeding (work up 09/2023), GERD   -Testing: Follow-up with outpatient provider for abnormal lab results: none.   -Recommend abstinence from alcohol , tobacco, and other illicit drug use at discharge.    -If your psychiatric symptoms recur, worsen, or if you have side effects to your psychiatric medications, call your outpatient psychiatric provider, 911, [phone] or go to the nearest emergency department.   -If suicidal thoughts recur, call your outpatient psychiatric provider, 911, [phone], the nearest emergency department or the [facility]al [facility] Ur[facility] at [address] in [place].   It's been nice getting to know you over your stay [place]! I wish you the very best.   Dr. Rollene, MD

## 2024-05-26 NOTE — Plan of Care (Signed)
   Problem: Education: Goal: Emotional status will improve Outcome: Progressing Goal: Mental status will improve Outcome: Progressing Goal: Verbalization of understanding the information provided will improve Outcome: Progressing   Problem: Activity: Goal: Interest or engagement in activities will improve Outcome: Progressing

## 2024-05-26 NOTE — BHH Counselor (Signed)
 CSW attended patient's meeting with [facility] Delora Remington) where she was able to ask questions regarding ACT services, confirm her psychiatrist and therapy provider, and determine what works best for her moving forward regarding her mental health. Act team to continue to follow-up with patient as she is being discharged today.  Geoge Lawrance, LCSWA 05/26/24 10:30am
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
## 2024-05-26 NOTE — Plan of Care (Signed)
  Problem: Coping: Goal: Ability to verbalize frustrations and anger appropriately will improve Outcome: Completed/Met   Problem: Physical Regulation: Goal: Ability to maintain clinical measurements within normal limits will improve Outcome: Completed/Met   Problem: Education: Goal: Knowledge of [facility] Education information/materials will improve Outcome: Completed/Met   Problem: Education: Goal: Emotional status will improve Outcome: Completed/Met

## 2024-05-26 NOTE — Progress Notes (Signed)
  [facility] Adult Case Management Discharge Plan :  Will you be returning to the same living situation after discharge:  Yes,  [address] [address] At discharge, do you have transportation home?: Yes,  The patient mom will provide transport. Do you have the ability to pay for your medications: Yes,  The patient has insurance  Release of information consent forms completed and in the chart;  Patient's signature needed at discharge.  Patient to Follow up at:  Follow-up Information     [facility] Follow up on 06/04/2024.   Why: You have an appointment for medication management services on 06/04/24 at 1:00 pm, Virtual telehealth. Contact information: [address] [place] [address] [phone]         [facility]. Schedule an appointment as soon as possible for a visit.   Specialty: [facility] information: [address] [place] [address] [phone]                 Next level of care provider has access to [facility] Link:yes  Safety Planning and Suicide Prevention discussed: Yes,  Carmelia Tiner (mother) [phone]     Has patient been referred to the Quitline?: Patient does not use tobacco/nicotine  products  Patient has been referred for addiction treatment: Yes, the patient will follow up with an outpatient provider for substance use disorder. Psychiatrist/APP: appointment made and Therapist: appointment made  Roselyn GORMAN Lento, LCSW 05/26/2024, 9:29 AM

## 2024-05-27 ENCOUNTER — Other Ambulatory Visit (HOSPITAL_COMMUNITY): Payer: Self-pay

## 2024-06-08 ENCOUNTER — Other Ambulatory Visit: Payer: Self-pay

## 2024-06-08 ENCOUNTER — Encounter (HOSPITAL_COMMUNITY): Payer: Self-pay

## 2024-06-08 ENCOUNTER — Emergency Department (HOSPITAL_COMMUNITY)
Admission: EM | Admit: 2024-06-08 | Discharge: 2024-06-10 | Disposition: A | Attending: Emergency Medicine | Admitting: Emergency Medicine

## 2024-06-08 DIAGNOSIS — E876 Hypokalemia: Secondary | ICD-10-CM | POA: Diagnosis not present

## 2024-06-08 DIAGNOSIS — F22 Delusional disorders: Secondary | ICD-10-CM | POA: Diagnosis not present

## 2024-06-08 DIAGNOSIS — F29 Unspecified psychosis not due to a substance or known physiological condition: Secondary | ICD-10-CM | POA: Insufficient documentation

## 2024-06-08 DIAGNOSIS — F250 Schizoaffective disorder, bipolar type: Secondary | ICD-10-CM | POA: Diagnosis present

## 2024-06-08 LAB — COMPREHENSIVE METABOLIC PANEL WITH GFR
ALT: 20 U/L (ref 0–44)
AST: 24 U/L (ref 15–41)
Albumin: 4 g/dL (ref 3.5–5.0)
Alkaline Phosphatase: 56 U/L (ref 38–126)
Anion gap: 14 (ref 5–15)
BUN: 12 mg/dL (ref 6–20)
CO2: 19 mmol/L — ABNORMAL LOW (ref 22–32)
Calcium: 8.7 mg/dL — ABNORMAL LOW (ref 8.9–10.3)
Chloride: 103 mmol/L (ref 98–111)
Creatinine, Ser: 0.95 mg/dL (ref 0.44–1.00)
GFR, Estimated: >60 mL/min (ref >60)
Glucose, Bld: 93 mg/dL (ref 70–99)
Potassium: 3.1 mmol/L — ABNORMAL LOW (ref 3.5–5.1)
Sodium: 136 mmol/L (ref 135–145)
Total Bilirubin: 1 mg/dL (ref 0.0–1.2)
Total Protein: 6.9 g/dL (ref 6.5–8.1)

## 2024-06-08 LAB — ETHANOL: Alcohol, Ethyl (B): <15 mg/dL (ref <15)

## 2024-06-08 LAB — HCG, SERUM, QUALITATIVE: Preg, Serum: NEGATIVE

## 2024-06-08 LAB — CBC
HCT: 39.5 % (ref 36.0–46.0)
Hemoglobin: 12.5 g/dL (ref 12.0–15.0)
MCH: 27.6 pg (ref 26.0–34.0)
MCHC: 31.6 g/dL (ref 30.0–36.0)
MCV: 87.2 fL (ref 80.0–100.0)
Platelets: 288 K/uL (ref 150–400)
RBC: 4.53 MIL/uL (ref 3.87–5.11)
RDW: 13.3 % (ref 11.5–15.5)
WBC: 7.4 K/uL (ref 4.0–10.5)
nRBC: 0 % (ref 0.0–0.2)

## 2024-06-08 LAB — RAPID URINE DRUG SCREEN, HOSP PERFORMED
Amphetamines: NOT DETECTED
Barbiturates: NOT DETECTED
Benzodiazepines: NOT DETECTED
Cocaine: NOT DETECTED
Opiates: NOT DETECTED
Tetrahydrocannabinol: NOT DETECTED

## 2024-06-08 LAB — ACETAMINOPHEN LEVEL: Acetaminophen (Tylenol), Serum: <10 ug/mL — ABNORMAL LOW (ref 10–30)

## 2024-06-08 LAB — SALICYLATE LEVEL: Salicylate Lvl: <7 mg/dL — ABNORMAL LOW (ref 7.0–30.0)

## 2024-06-08 MED ORDER — LORAZEPAM 1 MG PO TABS
2.0000 mg | ORAL_TABLET | Freq: Once | ORAL | Status: AC
  Administered 2024-06-08: 2 mg via ORAL
  Filled 2024-06-08: qty 2
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
## 2024-06-08 NOTE — ED Triage Notes (Signed)
 Arrives [facility] from home with erratic behavior.   Hx of schizoaffective and bipolar.   Says she took some pill earlier but doesn't know what it is.

## 2024-06-09 DIAGNOSIS — F22 Delusional disorders: Secondary | ICD-10-CM

## 2024-06-09 DIAGNOSIS — F29 Unspecified psychosis not due to a substance or known physiological condition: Secondary | ICD-10-CM

## 2024-06-09 LAB — SARS CORONAVIRUS 2 BY RT PCR: SARS Coronavirus 2 by RT PCR: NEGATIVE

## 2024-06-09 MED ORDER — TOPIRAMATE 25 MG PO TABS
50.0000 mg | ORAL_TABLET | Freq: Two times a day (BID) | ORAL | Status: DC
  Administered 2024-06-09 (×2): 50 mg via ORAL
  Filled 2024-06-09 (×2): qty 2

## 2024-06-09 MED ORDER — POTASSIUM CHLORIDE 20 MEQ PO PACK
40.0000 meq | PACK | Freq: Once | ORAL | Status: AC
  Administered 2024-06-09: 40 meq via ORAL
  Filled 2024-06-09: qty 2

## 2024-06-09 MED ORDER — STERILE WATER FOR INJECTION IJ SOLN
INTRAMUSCULAR | Status: AC
  Administered 2024-06-09: 1.2 mL via INTRAMUSCULAR
  Filled 2024-06-09: qty 10

## 2024-06-09 MED ORDER — DOXAZOSIN MESYLATE 2 MG PO TABS
2.0000 mg | ORAL_TABLET | Freq: Every day | ORAL | Status: DC
  Administered 2024-06-09: 2 mg via ORAL

## 2024-06-09 MED ORDER — CARIPRAZINE HCL 1.5 MG PO CAPS
3.0000 mg | ORAL_CAPSULE | Freq: Every day | ORAL | Status: DC
Start: 2024-06-09 — End: 2024-06-10
  Administered 2024-06-09: 3 mg via ORAL
  Filled 2024-06-09: qty 2

## 2024-06-09 MED ORDER — HALOPERIDOL 5 MG PO TABS
5.0000 mg | ORAL_TABLET | Freq: Every day | ORAL | Status: DC | PRN
  Administered 2024-06-09 – 2024-06-10 (×3): 5 mg via ORAL
  Filled 2024-06-09 (×3): qty 1

## 2024-06-09 MED ORDER — ZIPRASIDONE MESYLATE 20 MG IM SOLR
20.0000 mg | Freq: Once | INTRAMUSCULAR | Status: AC
  Administered 2024-06-09: 20 mg via INTRAMUSCULAR
  Filled 2024-06-09: qty 20

## 2024-06-09 NOTE — Discharge Instructions (Addendum)
 Transfer to [facility]

## 2024-06-09 NOTE — ED Notes (Signed)
 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 States people here do not know how to do their job. Patient not redirectable.

## 2024-06-09 NOTE — ED Notes (Signed)
 Patient woke up stating Oh my god I feel much better. Proceeded to ambulate to bathroom with steady gait. Provided with new pants, underwear, and pad due to being on menstrual cycle.

## 2024-06-09 NOTE — ED Notes (Signed)
 Contacted [employer] for transport and informed it will not happen till the morning due to distance.

## 2024-06-09 NOTE — ED Notes (Signed)
 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 Patient calm, not loud, cooperative. Decaf coffee & warm blankets given.

## 2024-06-09 NOTE — ED Provider Notes (Signed)
 Emergency Medicine Observation Re-evaluation Note  Stacy Poole is a 33 y.o. female, seen on rounds today.  Pt initially presented to the ED for complaints of Psychiatric Evaluation Currently, the patient is in her room, ate breakfast this morning.  Physical Exam  BP 109/75 (BP Location: Right Arm)   Pulse 73   Temp 98.5 F (36.9 C) (Oral)   Resp 20   Ht 5' 4 (1.626 m)   Wt 63.5 kg   SpO2 100%   BMI 24.03 kg/m  Physical Exam General: Awake, alert Cardiac: Normal rate Lungs: Normal effort Psych: Continuing to experience delusions.  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  ED Course / MDM  EKG:   I have reviewed the labs performed to date as well as medications administered while in observation.  Recent changes in the last 24 hours include psychiatry recommending admission.  Plan  Current plan is for admission and placement.    Mannie Pac T, DO 06/09/24 [phone]

## 2024-06-09 NOTE — ED Notes (Signed)
 Pt states she had a panic attack last night that she believes was brought on by an allergic reaction to tylenol . States she feels much better now and would like to talk to a provider about going home. Pt then made phone call to mother where she told her mother she was not having a break down and everything was related to the tylenol . Pt informed mother that she will be going to her new job on Thursday as planned.

## 2024-06-09 NOTE — Progress Notes (Signed)
 Pt has been accepted to [facility] on 06/09/2024 Bed assignment: ADULT UNIT   Pt meets inpatient criteria per: Roxianne Olp NP  Attending Physician will be: Prentice Balloon MD  Report can be called to: [phone]  Pt can arrive after COVID IS RESULTED   Care Team Notified: Elveria Batter NP, Nena Coffee RN  [place] Yasuo Phimmasone LCSW-A   06/09/2024 11:28 AM

## 2024-06-09 NOTE — BH Assessment (Signed)
 Comprehensive Clinical Assessment (CCA) Note  06/09/2024 Stacy Poole [id number]  Chief Complaint:  Chief Complaint  Patient presents with   Psychiatric Evaluation  Disposition: Stacy Bobbitt,NP recommends inpatient admission. Disposition SW to pursue appropriate inpatient options.  The patient demonstrates the following risk factors for suicide: Chronic risk factors for suicide include: psychiatric disorder of PTSD,Schizoaffective disorder, bipolar type, alcohol  use disorder, cannabis use disorder . Acute risk factors for suicide include: N/A. Protective factors for this patient include: n/a. Considering these factors, the overall suicide risk at this point appears to be low. Patient is not appropriate for outpatient follow up.  Per EDP note Stacy Poole is a 33 y.o. year-old female with a history of bipolar disorder presenting to the ED with chief complaint of psychiatric evaluation. 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  She is concerned that the people walking by her have guns and will shoot her soon.   Patient is a 33 year old female with a history of PTSD,Schizoaffective disorder, bipolar type, alcohol  use disorder, cannabis use disorder who presents voluntarily to [facility] for an assessment. Patient reports irritability,change in sleep, and change in appetite. Patient appears to be experiencing psychosis. Patient reports she has been in the [employer] service and [employer] for 14 years. Patient is very suspicious and paranoid during the assessment. Patient states she is unable to share a lot of information because there a psychotic people in here. Patient was hesitant to confirm her name and date of birth but ultimately was able to confirm this information. Patient did not want to disclose her living situation because people are listening. Patient was blurting out random statements during the  assessment and required constant redirection. Patient reported that people are following her and out to get her because she is in the [place] mob. Patient denies substance abuse. Patient denied SI/HI and AVH. When asked if she is taking any medication for her mental health concerns, patient states she is not going to take any.Unable to assess further due to patients presentation.      Visit Diagnosis:  Psychosis Schizoaffective disorder,bipolar type    CCA Screening, Triage and Referral (STR)  Patient Reported Information How did you hear about us ? Self  What Is the Reason for Your Visit/Call Today? Per EDP note Stacy Poole is a 33 y.o. year-old female with a history of bipolar disorder presenting to the ED with chief complaint of psychiatric evaluation.     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  She is concerned that the people walking by her have guns and will shoot her soon.  How Long Has This Been Causing You Problems? 1 wk - 1 month  What Do You Feel Would Help You the Most Today? Treatment for Depression or other mood problem   Have You Recently Had Any Thoughts About Hurting Yourself? No  Are You Planning to Commit Suicide/Harm Yourself At This time? No   Flowsheet Row ED from 06/08/2024 in [facility] Emergency Department at [facility] Most recent reading at 06/09/2024  1:36 AM Admission (Discharged) [facility] Most recent reading at 05/13/2024  4:00 PM ED from 05/13/2024 in [facility] Emergency Department at [facility] Most recent reading at 05/13/2024  8:24 AM  C-SSRS  RISK CATEGORY No Risk No Risk No Risk    Have you Recently Had Thoughts About Hurting Someone Stacy Poole? No  Are You Planning to Harm Someone at This Time? No  Explanation: n/a   Have You Used Any Alcohol  or Drugs in the Past 24 Hours? --  (UTA)  How Long Ago Did You Use Drugs or Alcohol ? uta  What Did You Use and How Much? UTA   Do You Currently Have a Therapist/Psychiatrist? -- (UTA)  Name of Therapist/Psychiatrist:    Have You Been Recently Discharged From Any Office Practice or Programs? No  Explanation of Discharge From Practice/Program: uta     CCA Screening Triage Referral Assessment Type of Contact: Tele-Assessment  Telemedicine Service Delivery: Telemedicine service delivery: This service was provided via telemedicine using a 2-way, interactive audio and video technology  Is this Initial or Reassessment? Is this Initial or Reassessment?: Initial Assessment  Date Telepsych consult ordered in [facility]:  Date Telepsych consult ordered in [facility]: 06/09/24  Time Telepsych consult ordered in [facility]:  Time Telepsych consult ordered in [facility]: 2315  Location of Assessment: [facility] ED  Provider Location: [facility] Assessment Services   Collateral Involvement: None   Does Patient Have a Automotive engineer Guardian? No  Legal Guardian Contact Information: N/A  Copy of Legal Guardianship Form: -- (N/A)  Legal Guardian Notified of Arrival: -- (N/A)  Legal Guardian Notified of Pending Discharge: -- (N/A)  If Minor and Not Living with Parent(s), Who has Custody? N/A  Is CPS involved or ever been involved? -- (UTA)  Is APS involved or ever been involved? -- (UTA)   Patient Determined To Be At Risk for Harm To Self or Others Based on Review of Patient Reported Information or Presenting Complaint? No  Method: No Plan  Availability of Means: No access or NA  Intent: Vague intent or NA  Notification Required: No need or identified person  Additional Information for Danger to Others Potential: -- (UTA)  Additional Comments for Danger to Others Potential: Pt denies HI  Are There Guns or Other Weapons in Your Home? No  Types of Guns/Weapons: Denies access  Are These Weapons Safely Secured?                             No  Who Could Verify You Are Able To Have These Secured: Denies access  Do You Have any Outstanding Charges, Pending Court Dates, Parole/Probation? UTA  Contacted To Inform of Risk of Harm To Self or Others: Other: Comment (N/A)    Does Patient Present under Involuntary Commitment? No    [place] of Residence: [place]   Patient Currently Receiving the Following Services: -- (UTA)   Determination of Need: Urgent (48 hours)   Options For Referral: Inpatient Hospitalization     CCA Biopsychosocial Patient Reported Schizophrenia/Schizoaffective Diagnosis in Past: Yes   Strengths: Seeking treatment   Mental Health Symptoms Depression:  Difficulty Concentrating; Increase/decrease in appetite; Irritability; Change in energy/activity; Sleep (too much or little)   Duration of Depressive symptoms: Duration of Depressive Symptoms: Greater than two weeks   Mania:  Change in energy/activity; Overconfidence; Racing thoughts; Recklessness; Irritability   Anxiety:   Worrying; Tension; Sleep; Difficulty concentrating; Fatigue; Irritability; Restlessness   Psychosis:  Delusions; Grossly disorganized speech   Duration of Psychotic symptoms: Duration of Psychotic Symptoms: N/A   Trauma:  None   Obsessions:  None   Compulsions:  None   Inattention:  None  Hyperactivity/Impulsivity:  N/A   Oppositional/Defiant Behaviors:  N/A   Emotional Irregularity:  Mood lability; Potentially harmful impulsivity   Other Mood/Personality Symptoms:  None noted    Mental Status Exam Appearance and self-care  Stature:  Average   Weight:  Average weight   Clothing:  -- (scrubs)   Grooming:  Normal   Cosmetic use:  Age appropriate   Posture/gait:  Normal   Motor activity:  Restless   Sensorium  Attention:  Distractible; Vigilant   Concentration:  Anxiety interferes; Preoccupied; Scattered   Orientation:  X5   Recall/memory:  Normal   Affect and Mood  Affect:   Anxious   Mood:  Anxious; Other (Comment); Irritable (Fearful)   Relating  Eye contact:  Fleeting   Facial expression:  Anxious   Attitude toward examiner:  Cooperative   Thought and Language  Speech flow: Flight of Ideas; Pressured   Thought content:  Delusions   Preoccupation:  Ruminations   Hallucinations:  None   Organization:  Patent examiner of Knowledge:  Average   Intelligence:  Average   Abstraction:  Functional   Judgement:  Poor   Reality Testing:  Variable   Insight:  Gaps   Decision Making:  Impulsive   Social Functioning  Social Maturity:  Isolates; Impulsive   Social Judgement:  Naive   Stress  Stressors:  Other (Comment) (uta)   Coping Ability:  Overwhelmed   Skill Deficits:  Self-care   Supports:  Support needed     Religion: Religion/Spirituality Are You A Religious Person?: No How Might This Affect Treatment?: n/a  Leisure/Recreation: Leisure / Recreation Do You Have Hobbies?: No  Exercise/Diet: Exercise/Diet Do You Exercise?: No Have You Gained or Lost A Significant Amount of Weight in the Past Six Months?: No Do You Follow a Special Diet?: No Do You Have Any Trouble Sleeping?: No   CCA Employment/Education Employment/Work Situation: Employment / Work Situation Employment Situation: Unemployed 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?: No  Education: Education Is Patient Currently Attending School?: No Last Grade Completed:  ginette) Did You Attend College?:  (uta) Did You Have An Individualized Education Program (IIEP):  ginette) Did You Have Any Difficulty At School?:  ginette) Patient's Education Has Been Impacted by Current Illness:  (uta)   CCA Family/Childhood History Family and Relationship History: Family history Marital status: Single Does patient have children?: No  Childhood History:  Childhood History By  whom was/is the patient raised?: Mother Did patient suffer any verbal/emotional/physical/sexual abuse as a child?:  (uta) Did patient suffer from severe childhood neglect?:  (uta) Has patient ever been sexually abused/assaulted/raped as an adolescent or adult?:  (uta) Was the patient ever a victim of a crime or a disaster?:  (uta) Witnessed domestic violence?:  (uta) Has patient been affected by domestic violence as an adult?:  (uta)       CCA Substance Use Alcohol /Drug Use: Alcohol  / Drug Use Pain Medications: uta Prescriptions: uta Over the Counter: uta History of alcohol  / drug use?:  (uta)                         ASAM's:  Six Dimensions of Multidimensional Assessment  Dimension 1:  Acute Intoxication and/or Withdrawal Potential:      Dimension 2:  Biomedical Conditions and Complications:      Dimension 3:  Emotional, Behavioral, or Cognitive Conditions  and Complications:     Dimension 4:  Readiness to Change:     Dimension 5:  Relapse, Continued use, or Continued Problem Potential:     Dimension 6:  Recovery/Living Environment:     ASAM Severity Score:    ASAM Recommended Level of Treatment:     Substance use Disorder (SUD)    Recommendations for Services/Supports/Treatments:    Disposition Recommendation per psychiatric provider: We recommend inpatient psychiatric hospitalization when medically cleared. Patient is under voluntary admission status at this time; please IVC if attempts to leave hospital.   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, 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/2019   Alcohol  abuse    Psychosis (HCC)    Schizoaffective disorder, bipolar type (HCC) 05/06/2018   Tobacco use disorder 05/06/2018   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

## 2024-06-09 NOTE — ED Provider Notes (Signed)
 BH team indicates pt accepted to [facility].   Pt is alert, content, no distress. Vitals normal.  Pt currently appears stable for transfer/transport.      Bernard Drivers, MD 06/09/24 [phone]

## 2024-06-09 NOTE — ED Notes (Signed)
 [facility] PAPERWORK COMPLETE , CASE NUMBER IS [id number] 3 COPIES ARE IN PURPLE ZONE, ORIGINAL IN RED FOLDER. 1 COPY IN MEDICAL RECORDS.   IF YOU NEED THE COPY IN MEDICAL RECORDS THEY COME AROUND EVERYDAY AT 7AM

## 2024-06-09 NOTE — ED Notes (Signed)
 Envelope # [id number] Envelope # [id number] Submitted at 06/09/2024 6:44 AM Envelope number given to magistrate office

## 2024-06-09 NOTE — ED Notes (Signed)
 Mom at bedside. Picking up pt keys to pt apartment upon pt request.

## 2024-06-09 NOTE — ED Notes (Signed)
 Pt's mother brought a change of clothing for pt to take to inpatient placement. Belongings placed in locker.

## 2024-06-09 NOTE — ED Notes (Signed)
 Report called to facility and spoke with Belvie. Was asked that we call facility when pt is leaving.

## 2024-06-09 NOTE — Progress Notes (Signed)
 LCSW Progress Note  [id number]   Stacy Poole  06/09/2024  11:03 AM  Description:   Inpatient Psychiatric Referral  Patient was recommended inpatient per [facility] Bobbitt(NP). There are no available beds at [facility], per [facility] Carlo RN. Patient was referred to the following out of network facilities:    Destination  Service Provider Address Phone Fax  [facility]  [address], [place] [address] [phone]  [facility] [address]  [address], [place] [address] [phone]  [facility]  [address] [place], [place] [address] [phone]  [facility]  [address] [place] [address] [phone]  [facility]  [address] [place] [address] [phone]  [facility]  [address] [place] [address] [phone]      Situation ongoing, CSW to continue following and update chart as more information becomes available.      [place] Tijah Hane, MSW, LCSW  06/09/2024 11:03 AM
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
## 2024-06-09 NOTE — ED Notes (Signed)
 Pt upset that she does not have her medications. Pt is yelling, screaming and stating that her uncle is in [employer]. Explained to pt that I am in the process of having another pt transferred out. Pt stated that I have not been prioritizing her

## 2024-06-10 NOTE — ED Provider Notes (Signed)
 Emergency Medicine Observation Re-evaluation Note  Stacy Poole is a 33 y.o. female, seen on rounds today.  Pt initially presented to the ED for complaints of Psychiatric Evaluation Currently, the patient is eating breakfast.  Physical Exam  BP (!) 92/56 (BP Location: Right Arm)   Pulse 79   Temp 98.4 F (36.9 C) (Oral)   Resp 16   Ht 5' 4 (1.626 m)   Wt 63.5 kg   SpO2 100%   BMI 24.03 kg/m  Physical Exam General: No acute distress Cardiac: Well-perfused Lungs: Nonlabored Psych: Cooperative  ED Course / MDM  EKG:   I have reviewed the labs performed to date as well as medications administered while in observation.  Recent changes in the last 24 hours include patient has been accepted to [place].  Plan  Current plan is for transfer to [facility].    Towana Ozell BROCKS, MD 06/10/24 [phone]

## 2024-09-02 ENCOUNTER — Emergency Department (HOSPITAL_COMMUNITY)
Admission: EM | Admit: 2024-09-02 | Discharge: 2024-09-03 | Disposition: A | Discharge status: Other Acute Inpatient Hospital | Attending: Emergency Medicine | Admitting: Emergency Medicine

## 2024-09-02 DIAGNOSIS — R4587 Impulsiveness: Secondary | ICD-10-CM | POA: Insufficient documentation

## 2024-09-02 DIAGNOSIS — F419 Anxiety disorder, unspecified: Secondary | ICD-10-CM | POA: Diagnosis not present

## 2024-09-02 DIAGNOSIS — F312 Bipolar disorder, current episode manic severe with psychotic features: Secondary | ICD-10-CM | POA: Diagnosis present

## 2024-09-02 DIAGNOSIS — F3110 Bipolar disorder, current episode manic without psychotic features, unspecified: Secondary | ICD-10-CM | POA: Insufficient documentation

## 2024-09-02 DIAGNOSIS — F29 Unspecified psychosis not due to a substance or known physiological condition: Secondary | ICD-10-CM | POA: Diagnosis present

## 2024-09-02 DIAGNOSIS — Y900 Blood alcohol level of less than 20 mg/100 ml: Secondary | ICD-10-CM | POA: Insufficient documentation

## 2024-09-02 DIAGNOSIS — F22 Delusional disorders: Secondary | ICD-10-CM

## 2024-09-02 DIAGNOSIS — R4789 Other speech disturbances: Secondary | ICD-10-CM | POA: Insufficient documentation

## 2024-09-02 LAB — COMPREHENSIVE METABOLIC PANEL WITH GFR
ALT: 16 U/L (ref 0–44)
AST: 22 U/L (ref 15–41)
Albumin: 4.9 g/dL (ref 3.5–5.0)
Alkaline Phosphatase: 78 U/L (ref 38–126)
Anion gap: 14 (ref 5–15)
BUN: 10 mg/dL (ref 6–20)
CO2: 23 mmol/L (ref 22–32)
Calcium: 9.9 mg/dL (ref 8.9–10.3)
Chloride: 100 mmol/L (ref 98–111)
Creatinine, Ser: 0.93 mg/dL (ref 0.44–1.00)
GFR, Estimated: >60 mL/min
Glucose, Bld: 109 mg/dL — ABNORMAL HIGH (ref 70–99)
Potassium: 3.7 mmol/L (ref 3.5–5.1)
Sodium: 137 mmol/L (ref 135–145)
Total Bilirubin: 0.5 mg/dL (ref 0.0–1.2)
Total Protein: 7.9 g/dL (ref 6.5–8.1)

## 2024-09-02 LAB — CBC WITH DIFFERENTIAL/PLATELET
Abs Immature Granulocytes: 0.01 K/uL (ref 0.00–0.07)
Basophils Absolute: 0 K/uL (ref 0.0–0.1)
Basophils Relative: 1 %
Eosinophils Absolute: 0 K/uL (ref 0.0–0.5)
Eosinophils Relative: 0 %
HCT: 41 % (ref 36.0–46.0)
Hemoglobin: 13.4 g/dL (ref 12.0–15.0)
Immature Granulocytes: 0 %
Lymphocytes Relative: 27 %
Lymphs Abs: 1.6 K/uL (ref 0.7–4.0)
MCH: 28.1 pg (ref 26.0–34.0)
MCHC: 32.7 g/dL (ref 30.0–36.0)
MCV: 86 fL (ref 80.0–100.0)
Monocytes Absolute: 0.6 K/uL (ref 0.1–1.0)
Monocytes Relative: 10 %
Neutro Abs: 3.5 K/uL (ref 1.7–7.7)
Neutrophils Relative %: 62 %
Platelets: 265 K/uL (ref 150–400)
RBC: 4.77 MIL/uL (ref 3.87–5.11)
RDW: 13.5 % (ref 11.5–15.5)
WBC: 5.7 K/uL (ref 4.0–10.5)
nRBC: 0 % (ref 0.0–0.2)

## 2024-09-02 LAB — ETHANOL: Alcohol, Ethyl (B): <15 mg/dL

## 2024-09-02 LAB — HCG, SERUM, QUALITATIVE: Preg, Serum: NEGATIVE

## 2024-09-02 MED ORDER — ACETAMINOPHEN 325 MG PO TABS
650.0000 mg | ORAL_TABLET | Freq: Once | ORAL | Status: AC
  Administered 2024-09-02: 650 mg via ORAL
  Filled 2024-09-02: qty 2

## 2024-09-02 MED ORDER — DOXAZOSIN MESYLATE 2 MG PO TABS
2.0000 mg | ORAL_TABLET | Freq: Every day | ORAL | Status: DC
  Administered 2024-09-02: 2 mg via ORAL
  Filled 2024-09-02: qty 1

## 2024-09-02 MED ORDER — AMLODIPINE BESYLATE 5 MG PO TABS
5.0000 mg | ORAL_TABLET | Freq: Every day | ORAL | Status: DC
  Filled 2024-09-02: qty 1

## 2024-09-02 MED ORDER — TOPIRAMATE 25 MG PO TABS
25.0000 mg | ORAL_TABLET | Freq: Two times a day (BID) | ORAL | Status: DC
  Administered 2024-09-02 – 2024-09-03 (×2): 25 mg via ORAL
  Filled 2024-09-02 (×2): qty 1

## 2024-09-02 MED ORDER — HYDROXYZINE HCL 25 MG PO TABS
25.0000 mg | ORAL_TABLET | Freq: Three times a day (TID) | ORAL | Status: DC | PRN
  Administered 2024-09-02 – 2024-09-03 (×2): 25 mg via ORAL
  Filled 2024-09-02 (×2): qty 1

## 2024-09-02 MED ORDER — TRAZODONE HCL 100 MG PO TABS
50.0000 mg | ORAL_TABLET | Freq: Every day | ORAL | Status: DC
  Administered 2024-09-02: 50 mg via ORAL
  Filled 2024-09-02: qty 1

## 2024-09-02 MED ORDER — HALOPERIDOL LACTATE 5 MG/ML IJ SOLN: 5.0000 mg | Freq: Four times a day (QID) | INTRAMUSCULAR | Status: DC | PRN

## 2024-09-02 MED ORDER — LORAZEPAM 1 MG PO TABS
1.0000 mg | ORAL_TABLET | Freq: Once | ORAL | Status: AC
  Administered 2024-09-02: 1 mg via ORAL
  Filled 2024-09-02: qty 1

## 2024-09-02 MED ORDER — CARIPRAZINE HCL 1.5 MG PO CAPS
3.0000 mg | ORAL_CAPSULE | Freq: Every day | ORAL | Status: DC
  Administered 2024-09-02: 3 mg via ORAL
  Filled 2024-09-02: qty 2

## 2024-09-02 MED ORDER — AMLODIPINE BESYLATE 5 MG PO TABS
5.0000 mg | ORAL_TABLET | Freq: Once | ORAL | Status: AC
  Administered 2024-09-03: 5 mg via ORAL
  Filled 2024-09-02: qty 1

## 2024-09-02 MED ORDER — LORAZEPAM 2 MG/ML IJ SOLN
2.0000 mg | Freq: Four times a day (QID) | INTRAMUSCULAR | Status: DC | PRN
  Administered 2024-09-03: 2 mg via INTRAMUSCULAR
  Filled 2024-09-02: qty 1

## 2024-09-02 NOTE — Progress Notes (Signed)
 Pt has been accepted to [facility] on 09/02/2024 . Bed assignment:8N   Pt meets inpatient criteria per Cathaleen Adam, NP   Attending Physician will be Dr. Nash,   Report can be called to: - [phone]   Pt can arrive after 8AM pending negative COVID   Care Team Notified: Warrick Lesches, RN
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
## 2024-09-02 NOTE — Progress Notes (Signed)
 Inpatient Psychiatric Referral  Patient was recommended inpatient per Jadeka Motley-Mangrum, NP. There are no available beds at [facility], per [facility]. Patient was referred to the following out of network facilities:  Destination  Service Provider Address Phone Fax  [facility]  [address], [place] [address] [phone]  [facility]  [address] [place], [place] [address] [phone]  [facility]  [address] [place] [address] [phone]  [facility]  [address], [place] [address] [phone]  [facility]  [address], [place] [address] [phone]  [facility]  [address] [place] [address] [phone]    Situation ongoing, CSW to continue following and update chart as more information becomes available.   Harrie Sofia MSW, [place] 09/02/2024

## 2024-09-02 NOTE — ED Notes (Signed)
 Patient in room 28.  Patient disorganized,  anxious, saying scared,  paranoid,.delusional , tearful.
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
## 2024-09-03 LAB — RESP PANEL BY RT-PCR (RSV, FLU A&B, COVID)  RVPGX2
Influenza A by PCR: NEGATIVE
Influenza B by PCR: NEGATIVE
Resp Syncytial Virus by PCR: NEGATIVE
SARS Coronavirus 2 by RT PCR: NEGATIVE

## 2024-09-03 NOTE — ED Provider Notes (Signed)
 Emergency Medicine Observation Re-evaluation Note  Stacy Poole is a 33 y.o. female, seen on rounds today.  Pt initially presented to the ED for complaints of Psychiatric Evaluation Currently, the patient is resting, asking for breakfast.  Physical Exam  BP (!) 141/102   Pulse 80   Temp (!) 97.4 F (36.3 C) (Oral)   Resp 16   SpO2 98%  Physical Exam General: no distress Lungs: normal effort Psych: no agitation  ED Course / MDM  EKG:   I have reviewed the labs performed to date as well as medications administered while in observation.  Recent changes in the last 24 hours include psych/home meds ordered.  Plan  Current plan is for transfer to [facility] this morning. Accepting is Dr. Nash Breeding, [place], MD 09/03/24 [phone]

## 2024-09-03 NOTE — ED Notes (Signed)
 Patient did not sleep all night. Pt kept coming to the door. Making statements such as she works for the [employer].

## 2024-09-03 NOTE — ED Notes (Signed)
 Patient report was called to [facility]. [facility], will call when they are on the way.

## 2024-09-03 NOTE — ED Notes (Signed)
 Pt requested medication for anxiety, PRN given.

## 2024-09-10 LAB — DRUG SCREEN 10 W/CONF, SERUM
Amphetamines, IA: NEGATIVE ng/mL
Barbiturates, IA: NEGATIVE ug/mL
Benzodiazepines, IA: NEGATIVE ng/mL
Cocaine & Metabolite, IA: NEGATIVE ng/mL
Methadone, IA: NEGATIVE ng/mL
Opiates, IA: NEGATIVE ng/mL
Oxycodones, IA: NEGATIVE ng/mL
Phencyclidine, IA: NEGATIVE ng/mL
Propoxyphene, IA: NEGATIVE ng/mL
THC(Marijuana) Metabolite, IA: POSITIVE ng/mL — AB

## 2024-09-10 LAB — THC,MS,WB/SP RFX
Cannabidiol: NEGATIVE
Cannabinoid Confirmation: POSITIVE
Carboxy-THC: 3.4 ng/mL
Hydroxy-THC: NEGATIVE ng/mL
Tetrahydrocannabinol(THC): NEGATIVE ng/mL

## 2024-10-01 ENCOUNTER — Other Ambulatory Visit: Payer: Self-pay

## 2024-10-01 ENCOUNTER — Emergency Department (HOSPITAL_COMMUNITY)
Admission: EM | Admit: 2024-10-01 | Discharge: 2024-10-03 | Disposition: A | Discharge status: Other Acute Inpatient Hospital | Attending: Emergency Medicine | Admitting: Emergency Medicine

## 2024-10-01 ENCOUNTER — Encounter (HOSPITAL_COMMUNITY): Payer: Self-pay

## 2024-10-01 DIAGNOSIS — F3010 Manic episode without psychotic symptoms, unspecified: Secondary | ICD-10-CM

## 2024-10-01 DIAGNOSIS — F1220 Cannabis dependence, uncomplicated: Secondary | ICD-10-CM | POA: Diagnosis present

## 2024-10-01 DIAGNOSIS — F312 Bipolar disorder, current episode manic severe with psychotic features: Secondary | ICD-10-CM | POA: Diagnosis present

## 2024-10-01 DIAGNOSIS — R45850 Homicidal ideations: Secondary | ICD-10-CM | POA: Diagnosis not present

## 2024-10-01 DIAGNOSIS — R4689 Other symptoms and signs involving appearance and behavior: Secondary | ICD-10-CM | POA: Diagnosis present

## 2024-10-01 LAB — CBC
HCT: 40 % (ref 36.0–46.0)
Hemoglobin: 12.8 g/dL (ref 12.0–15.0)
MCH: 27.5 pg (ref 26.0–34.0)
MCHC: 32 g/dL (ref 30.0–36.0)
MCV: 86 fL (ref 80.0–100.0)
Platelets: 429 10*3/uL — ABNORMAL HIGH (ref 150–400)
RBC: 4.65 MIL/uL (ref 3.87–5.11)
RDW: 14.2 % (ref 11.5–15.5)
WBC: 10.8 10*3/uL — ABNORMAL HIGH (ref 4.0–10.5)
nRBC: 0 % (ref 0.0–0.2)

## 2024-10-01 LAB — COMPREHENSIVE METABOLIC PANEL WITH GFR
ALT: 23 U/L (ref 0–44)
AST: 29 U/L (ref 15–41)
Albumin: 4.3 g/dL (ref 3.5–5.0)
Alkaline Phosphatase: 67 U/L (ref 38–126)
Anion gap: 15 (ref 5–15)
BUN: 7 mg/dL (ref 6–20)
CO2: 21 mmol/L — ABNORMAL LOW (ref 22–32)
Calcium: 9.3 mg/dL (ref 8.9–10.3)
Chloride: 105 mmol/L (ref 98–111)
Creatinine, Ser: 0.83 mg/dL (ref 0.44–1.00)
GFR, Estimated: >60 mL/min
Glucose, Bld: 149 mg/dL — ABNORMAL HIGH (ref 70–99)
Potassium: 3.5 mmol/L (ref 3.5–5.1)
Sodium: 141 mmol/L (ref 135–145)
Total Bilirubin: 0.4 mg/dL (ref 0.0–1.2)
Total Protein: 7.3 g/dL (ref 6.5–8.1)

## 2024-10-01 LAB — URINE DRUG SCREEN
Amphetamines: NEGATIVE
Barbiturates: NEGATIVE
Benzodiazepines: NEGATIVE
Cocaine: NEGATIVE
Fentanyl: NEGATIVE
Methadone Scn, Ur: NEGATIVE
Opiates: NEGATIVE
Tetrahydrocannabinol: POSITIVE — AB

## 2024-10-01 LAB — ETHANOL: Alcohol, Ethyl (B): <15 mg/dL

## 2024-10-01 LAB — HCG, SERUM, QUALITATIVE: Preg, Serum: NEGATIVE

## 2024-10-01 MED ORDER — ZIPRASIDONE MESYLATE 20 MG IM SOLR
20.0000 mg | Freq: Once | INTRAMUSCULAR | Status: DC
  Filled 2024-10-01: qty 20

## 2024-10-01 MED ORDER — ZIPRASIDONE HCL 20 MG PO CAPS
20.0000 mg | ORAL_CAPSULE | Freq: Once | ORAL | Status: AC
  Administered 2024-10-01: 20 mg via ORAL
  Filled 2024-10-01: qty 1

## 2024-10-01 NOTE — ED Triage Notes (Signed)
 Pt presents with [employer] voluntary with c/o disorganized thought and manic behavior per [employer]. Hx Bipolar and schizoaffective disorder per [employer].   Pt is shouting she is pregnant with Jesus during assessment.   Reported recent increase in meds however have not been started.

## 2024-10-01 NOTE — ED Notes (Signed)
 IVC given by Raford MD; in process
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
## 2024-10-02 ENCOUNTER — Encounter (HOSPITAL_COMMUNITY): Payer: Self-pay | Admitting: Psychiatry

## 2024-10-02 MED ORDER — STERILE WATER FOR INJECTION IJ SOLN
INTRAMUSCULAR | Status: AC
  Filled 2024-10-02: qty 10

## 2024-10-02 MED ORDER — GUAIFENESIN 100 MG/5ML PO LIQD
5.0000 mL | ORAL | Status: DC | PRN
  Administered 2024-10-03 (×2): 5 mL via ORAL
  Filled 2024-10-02 (×2): qty 10

## 2024-10-02 MED ORDER — CLONAZEPAM 0.5 MG PO TABS
1.0000 mg | ORAL_TABLET | Freq: Every day | ORAL | Status: DC
  Administered 2024-10-02: 1 mg via ORAL
  Filled 2024-10-02: qty 2

## 2024-10-02 MED ORDER — TOPIRAMATE 25 MG PO TABS
50.0000 mg | ORAL_TABLET | Freq: Two times a day (BID) | ORAL | Status: DC
  Administered 2024-10-02 – 2024-10-03 (×3): 50 mg via ORAL
  Filled 2024-10-02 (×3): qty 2

## 2024-10-02 MED ORDER — OLANZAPINE 5 MG PO TBDP
10.0000 mg | ORAL_TABLET | Freq: Once | ORAL | Status: AC | PRN
  Administered 2024-10-02: 10 mg via ORAL
  Filled 2024-10-02: qty 2

## 2024-10-02 MED ORDER — LORAZEPAM 2 MG/ML IJ SOLN
2.0000 mg | Freq: Once | INTRAMUSCULAR | Status: AC
  Administered 2024-10-02: 2 mg via INTRAMUSCULAR
  Filled 2024-10-02: qty 1

## 2024-10-02 MED ORDER — CHLORPROMAZINE HCL 25 MG PO TABS
25.0000 mg | ORAL_TABLET | Freq: Three times a day (TID) | ORAL | Status: DC
  Administered 2024-10-02 – 2024-10-03 (×4): 25 mg via ORAL
  Filled 2024-10-02 (×4): qty 1

## 2024-10-02 MED ORDER — LORAZEPAM 1 MG PO TABS
1.0000 mg | ORAL_TABLET | ORAL | Status: AC | PRN
  Administered 2024-10-02 – 2024-10-03 (×2): 1 mg via ORAL
  Filled 2024-10-02 (×2): qty 1

## 2024-10-02 MED ORDER — ZIPRASIDONE MESYLATE 20 MG IM SOLR
10.0000 mg | Freq: Once | INTRAMUSCULAR | Status: AC
  Administered 2024-10-02: 10 mg via INTRAMUSCULAR

## 2024-10-02 MED ORDER — HALOPERIDOL LACTATE 5 MG/ML IJ SOLN
5.0000 mg | Freq: Once | INTRAMUSCULAR | Status: AC
  Administered 2024-10-02: 5 mg via INTRAMUSCULAR
  Filled 2024-10-02: qty 1

## 2024-10-02 MED ORDER — DIPHENHYDRAMINE HCL 50 MG/ML IJ SOLN
25.0000 mg | Freq: Once | INTRAMUSCULAR | Status: AC
  Administered 2024-10-02: 25 mg via INTRAMUSCULAR
  Filled 2024-10-02: qty 1

## 2024-10-02 MED ORDER — BENZTROPINE MESYLATE 0.5 MG PO TABS
0.5000 mg | ORAL_TABLET | Freq: Two times a day (BID) | ORAL | Status: DC
  Administered 2024-10-02 – 2024-10-03 (×3): 0.5 mg via ORAL
  Filled 2024-10-02 (×4): qty 1

## 2024-10-02 NOTE — ED Notes (Signed)
 PT lying down and attempting to rest. Will hold on IM meds for now.

## 2024-10-02 NOTE — ED Notes (Signed)
 Case: [id number], waiting for findings
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
## 2024-10-02 NOTE — ED Notes (Signed)
 Unfortunately, pt is not resting and will no longer lie down. Has not stopped talking with pressured speech. Is interactive with staff and conversing.

## 2024-10-02 NOTE — ED Notes (Signed)
 Findings delivered, upload in process and copies being made

## 2024-10-02 NOTE — ED Notes (Signed)
 Pt dressed out in paper scrubs. Did refuse to take off her black velvet boxers.
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
## 2024-10-02 NOTE — ED Notes (Signed)
 Petition and First Exam Envelope [id number]

## 2024-10-02 NOTE — ED Provider Notes (Signed)
" °  Physical Exam  BP (!) 145/99   Pulse (!) 122   Temp 98 F (36.7 C) (Axillary)   Resp (!) 24   Ht 5' 3 (1.6 m)   Wt 63.5 kg   SpO2 100%   BMI 24.80 kg/m   Physical Exam Psychiatric:     Comments: Agitated     Procedures  .Critical Care  Performed by: Logan Ubaldo NOVAK, PA-C Authorized by: Logan Ubaldo NOVAK, PA-C   Critical care provider statement:    Critical care time (minutes):  30   Critical care time was exclusive of:  Separately billable procedures and treating other patients   Critical care was necessary to treat or prevent imminent or life-threatening deterioration of the following conditions: Agitation requiring chemical sedation.   Critical care was time spent personally by me on the following activities:  Development of treatment plan with patient or surrogate, discussions with consultants, evaluation of patient's response to treatment, examination of patient, ordering and review of laboratory studies, ordering and review of radiographic studies, ordering and performing treatments and interventions, pulse oximetry, re-evaluation of patient's condition and review of old charts   Care discussed with: admitting provider     ED Course / MDM    Medical Decision Making Amount and/or Complexity of Data Reviewed Labs: ordered.  Risk Prescription drug management.   Patient requiring chemical sedation due to increased agitation.  Patient initially treated with oral Geodon  momentarily.  After IVC order placed patient became increasingly agitated pacing, trying to leave the facility and required IM Geodon . Patient continued to be inadequately sedated.        Logan Ubaldo NOVAK [name] 10/02/24 0417    Raford Lenis, MD 10/02/24 [phone]  "

## 2024-10-02 NOTE — ED Notes (Signed)
 Warm blankets given and attempts to calm and redirect patient
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
## 2024-10-02 NOTE — ED Notes (Signed)
 Patient is resting comfortably at this time

## 2024-10-02 NOTE — Consult Note (Signed)
 [facility] Psychiatric Consult Initial  Patient Name: .Stacy Poole  MRN: [id number]  DOB: 1991-06-18  Consult Order details:  Orders (From admission, onward)     Start     Ordered   10/01/24 2356  CONSULT TO CALL ACT TEAM       Ordering Provider: Harold Tillman DASEN, PA-C  Provider:  (Not yet assigned)  Question:  Reason for Consult?  Answer:  Psych consult   10/01/24 2356             Mode of Visit: In person    Psychiatry Consult Evaluation  Service Date: October 02, 2024 LOS:  LOS: 0 days  Chief Complaint: Psych Eval.   Primary Psychiatric Diagnoses    Severe manic bipolar 1 disorder with psychotic behavior (HCC)  Assessment   Stacy Poole is a 34 y.o. Caucasian female with a past psychiatric history of schizophrenia, schizoaffective disorder bipolar type, bipolar 1 disorder, PTSD, stimulant induced psychosis, and cannabis use disorder, with pertinent medical comorbidities/history that include none, who presented this encounter by way of GPD, for concerns for manic psychosis, who upon EDP evaluation, consulted psychiatry for specialty evaluation and recommendations.  Patient is medically clear, per EDP team, as well as involuntarily committed at this time.  Upon examination conducted, patient presents with symptomology that is most consistent with an acute decompensation of the patient's chronic illness course of bipolar 1 disorder, with current episode being severe mania and with psychotic features, as well as the patient's chronic illness course of cannabis use disorder severe with dependence.  Evidence of this is appreciable from examination conducted, where patient presents with a severely paranoid, disorganized, and agitated interpersonal style, and UDS that is positive for cannabis metabolites.  Patient presentation likely made worse by cannabis use, but etiologically, underlying and primary diagnosis is still the patient's chronic illness course of bipolar 1  disorder, with current episode again being severe mania, with psychotic features.  Given examination conducted, where patient presents with severely decompensated presentation, and notably frequent vague expressions of needing to kill people that are after her, recommendation is for inpatient mental health hospitalization, for safety and stabilization of the patient. Given the severe decompensated state of the patient, in hopes of stabilizing the patient, will gradually restart historical regimen from [facility] recent hospitalization mother endorses and agrees is a good plan of care to move forward with.   I personally spent a total of 70 minutes in the care of the patient today including preparing to see the patient, getting/reviewing separately obtained history, performing a medically appropriate exam/evaluation, counseling and educating, placing orders, referring and communicating with other health care professionals, documenting clinical information in the EHR, independently interpreting results, communicating results, coordinating care, and collateral.  Results of consult conveyed to primary EDP team provider Dr. Emil.  Diagnoses:  Active Hospital problems: Principal Problem:   Severe manic bipolar 1 disorder with psychotic behavior (HCC) Active Problems:   Cannabis use disorder, severe, dependence (HCC)    Plan   #  Severe manic bipolar 1 disorder with psychotic behavior (HCC)  ## Psychiatric Medication Recommendations:   - Recommend restart Thorazine  at 25 mg p.o. 3 times daily - Recommend restart Cogentin  0.5 mg p.o. twice daily - Recommend restart Klonopin  1 mg p.o. nightly - Recommend restart topiramate  50 mg p.o. twice daily  ## Medical Decision Making Capacity: Lacking capacity  ## Further Work-up: None at this time  ## Disposition:--Recommend inpatient mental health hospitalization under involuntary commitment  ##  Behavioral / Environmental: -Strict agitation/safety  precautions    ## Safety and Observation Level:  - Based on my clinical evaluation, I estimate the patient to be at high risk of self harm in the current setting. - At this time, we recommend  1:1 Observation. This decision is based on my review of the chart including patient's history and current presentation, interview of the patient, mental status examination, and consideration of suicide risk including evaluating suicidal ideation, plan, intent, suicidal or self-harm behaviors, risk factors, and protective factors. This judgment is based on our ability to directly address suicide risk, implement suicide prevention strategies, and develop a safety plan while the patient is in the clinical setting. Please contact our team if there is a concern that risk level has changed.  CSSR Risk Category:C-SSRS RISK CATEGORY: No Risk  Suicide Risk Assessment: Patient has following modifiable risk factors for suicide: recklessness, medication noncompliance, active mental illness (to encompass adhd, tbi, mania, psychosis, trauma reaction), current symptoms: anxiety/panic, insomnia, impulsivity, anhedonia, hopelessness, triggering events, and recent psychiatric hospitalization, which we are addressing by recommendations. Patient has following non-modifiable or demographic risk factors for suicide: psychiatric hospitalization Patient has the following protective factors against suicide: Access to outpatient mental health care, Supportive family, Supportive friends, no history of suicide attempts, and no history of NSSIB  Thank you for this consult request. Recommendations have been communicated to the primary team.  We will Continue to follow at this time.   Stacy JINNY Gravely, NP       History of Present Illness   Stacy Poole is a 34 y.o. Caucasian female with a past psychiatric history of schizophrenia, schizoaffective disorder bipolar type, bipolar 1 disorder, PTSD, stimulant induced psychosis, and  cannabis use disorder, with pertinent medical comorbidities/history that include none, who presented this encounter by way of GPD, for concerns for manic psychosis, who upon EDP evaluation, consulted psychiatry for specialty evaluation and recommendations.  Patient is medically clear, per EDP team, as well as involuntarily committed at this time.  Patient seen today at the [facility] emergency department for face-to-face psychiatric evaluation.  Upon evaluation, patient engagement largely characterized by severely disorganized, paranoid, and agitated interpersonal style, significant appreciable evidence of responding to internal stimuli (looking around constantly), disorganized and nonsensical thought process with vague frequent expressions of paranoid and grandiose delusional themes with homicidal expressions towards unknown persons intermittently, with an oddly constricted affect with agitated and paranoid edge, with limited eye contact, and ultimately, no ability to participate in any meaningful examination.  Collateral, patient's mother and emergency contact, Ms. Ringel, spoken to over the phone at [phone]  Call placed and extensive conversation held with the patient's mother, discussed the recent events that transpired, the patient's historical information listed below, as well as to discuss plan of care to move forward.  Patient's mother endorses that the patient has been out of the hospital for approximately 1 week, after she states, really discharging only mostly stable I feel like from [facility].  Patient's mother endorses that since the patient has been on the hospital, states that the patient has been telling her when she calls and checks in on her that she has been taking her medications, but states that she is unsure if this is the case, as she states that when she calls and checks on her daughter, states that it has sounded like over the last week, that the patient has  unfortunately been progressively decompensating.  Discussing the recent events that transpired, patient's  mother endorses that yesterday afternoon she states that she could not get a hold the patient, which she states concerned her, so she states that she proceeded to attempt to drive by the patient's apartment complex to try to check on her, when she states that she received a call from one of the patient's neighbors, stating that the patient was outside of her apartment appearing decompensated and not doing well, which she states led to the neighbors notifying her that the police had been called, which she states that upon her arrival, and subsequently the police arriving, states that in the context of the patient appearing clearly decompensated and not doing well, states that the patient was brought in by police.  Patient's mother endorse that unfortunately incidence of the patient decompensating our not infrequent, states that the patient has had at least 26+ inpatient mental health hospitalizations for decompensation.  Patient's mother endorses that during recent hospitalization she was on a regimen that is different from what she is currently prescribed on an outpatient basis, and endorses that through her outpatient provider, the patient is supposed to be on the regimen listed below, but endorses that given the patient is now been brought in, agrees that the patient very likely has not been taking her medications.  Discussed with the patient's mother regimens that could be effective in stabilizing the patient, to which after extensive conversation, patient mother agrees with this provider that the above recommendations would be of benefit for the patient, thus discussed with the patient's mother that recommendation would be for inpatient mental health hospitalization, as well as starting the medication regimen listed above.  Review of Systems  Unable to perform ROS: Psychiatric disorder      Psychiatric and Social History  Psychiatric History:  Information collected from Chart review/family  Prev Dx/Sx: As above Current Psych Provider: Dr. Akintayo  Home Meds (current): Has not been taking, but outpatient regimen is as allegedly follows, per mother  - Topiramate  50 mg p.o. twice daily - Trazodone  100 mg p.o. nightly - Vraylar  4.5 mg p.o. nightly - Hydroxyzine  25 mg as needed for anxiety  Previous Med Trials: See chart Therapy: Therapist, yes, not regularly, sees April  Prior Psych Hospitalization: Multiple, greater than 26, most recent one week ago  Prior Self Harm: Yes Prior Violence: Yes  Family Psych History: None reported Family Hx suicide: None reported  Social History:  Developmental Hx: None reported Educational Hx: None reported Occupational Hx: SSDI Legal Hx: IVC, multiple  Living Situation: Alone  Spiritual Hx: None reported  Access to weapons/lethal means: None reported   Substance History Alcohol : Hx of abuse, mom states has been sober lately  Tobacco: Yes Illicit drugs: Cannabis abuse, unsure of current use, but uds positive; Hx of using meth Prescription drug abuse: None reported Rehab hx: None reported  Exam Findings  Physical Exam: AS  below Vital Signs:  Temp:  [98 F (36.7 C)-99.3 F (37.4 C)] 99.3 F (37.4 C) (01/29 0812) Pulse Rate:  [74-122] 85 (01/29 0812) Resp:  [20-24] 20 (01/29 0812) BP: (135-145)/(89-99) 135/89 (01/29 0812) SpO2:  [100 %] 100 % (01/29 0812) Weight:  [63.5 kg] 63.5 kg (01/28 2209) Blood pressure 135/89, pulse 85, temperature 99.3 F (37.4 C), resp. rate 20, height 5' 3 (1.6 m), weight 63.5 kg, SpO2 100%. Body mass index is 24.8 kg/m.  Physical Exam Vitals and nursing note reviewed.  Constitutional:      General: She is not in acute distress.    Appearance:  She is not ill-appearing, toxic-appearing or diaphoretic.     Comments: Disorganized, paranoid, and agitated interpersonal style  Pulmonary:      Effort: Pulmonary effort is normal.  Skin:    General: Skin is warm and dry.  Neurological:     Motor: No weakness, tremor or seizure activity.     Comments:    Psychiatric:        Attention and Perception: She is inattentive.        Behavior: Behavior is agitated and hyperactive.        Thought Content: Thought content is paranoid and delusional. Thought content includes homicidal ideation. Thought content does not include suicidal ideation. Thought content does not include homicidal plan.        Cognition and Memory: Cognition is impaired. Memory is impaired.        Judgment: Judgment is impulsive and inappropriate.     Comments: Perception: Appears internally preoccupied Speech: Incoherent and nonsensical Affect: Oddly constricted with agitated and paranoid edge Mood: my mood is post-operational nuclear winter      Mental Status Exam: General Appearance: In scrubs Caucasian female with disorganized, agitated, and paranoid interpersonal style  Orientation: Acutely impaired  Memory:  Acutely impaired  Concentration:  Concentration: Poor and Attention Span: Poor  Recall:  Poor  Attention  Poor  Eye Contact: Variable to absent  Speech:  Disorganized and nonsensical  Language:  Poor  Volume:  Normal  Mood: my mood is post-operational nuclear winter  Affect:  Oddly constricted with agitated and paranoid edge  Thought Process:  Disorganized  Thought Content:  Illogical  Suicidal Thoughts:  No  Homicidal Thoughts: Vague expressions  Judgement:  Impaired, acutely  Insight:  Lacking  Psychomotor Activity:  Increased  Akathisia:  No  Fund of Knowledge:  Acutely impaired      Assets:  Health And Safety Inspector Housing Leisure Time Physical Health Resilience Social Support Talents/Skills Transportation Vocational/Educational  Cognition: Acutely impaired  ADL's:  Intact  AIMS (if indicated):   0     Other History   These have been pulled in through the  EMR, reviewed, and updated if appropriate.  Family History:  The patient's family history is not on file.  Medical History: Past Medical History:  Diagnosis Date   Acute ear infection    Anxiety    Arachnoid cyst    Bipolar 1 disorder (HCC)    Fifth disease    Insomnia    Mental disorder    Mononucleosis    PTSD (post-traumatic stress disorder)    Schizophrenia (HCC)    Substance abuse (HCC)     Surgical History: History reviewed. No pertinent surgical history.   Medications:  Current Medications[1]  Allergies: Allergies[2]  Stacy JINNY Gravely, NP      [1]  Current Facility-Administered Medications:    benztropine  (COGENTIN ) tablet 0.5 mg, 0.5 mg, Oral, BID, Poole Stacy JINNY, NP   chlorproMAZINE  (THORAZINE ) tablet 25 mg, 25 mg, Oral, TID, Poole Stacy JINNY, NP   clonazePAM  (KLONOPIN ) tablet 1 mg, 1 mg, Oral, QHS, Poole Stacy JINNY, NP   topiramate  (TOPAMAX ) tablet 50 mg, 50 mg, Oral, BID, Poole Stacy JINNY, NP   ziprasidone  (GEODON ) injection 20 mg, 20 mg, Intramuscular, Once, McCauley, Larry B, PA-C  Current Outpatient Medications:    acetaminophen  (TYLENOL ) 500 MG tablet, Take 500 mg by mouth every 6 (six) hours as needed for mild pain (pain score 1-3) or headache., Disp: , Rfl:    cariprazine  (VRAYLAR ) 3 MG capsule, Take  3 mg by mouth every evening., Disp: , Rfl:    doxazosin  (CARDURA ) 1 MG tablet, Take 2 mg by mouth at bedtime., Disp: , Rfl:    hydrOXYzine  (ATARAX ) 25 MG tablet, Take 25 mg by mouth at bedtime as needed for anxiety., Disp: , Rfl:    mirtazapine  (REMERON ) 7.5 MG tablet, Take 1 tablet (7.5 mg total) by mouth at bedtime. (Patient not taking: Reported on 09/02/2024), Disp: 30 tablet, Rfl: 0   topiramate  (TOPAMAX ) 25 MG tablet, Take 25 mg by mouth 2 (two) times daily., Disp: , Rfl:    topiramate  (TOPAMAX ) 50 MG tablet, Take 1 tablet (50 mg total) by mouth 2 (two) times daily. (Patient not taking: Reported on 09/02/2024), Disp: 60  tablet, Rfl: 0   traZODone  (DESYREL ) 50 MG tablet, Take 50 mg by mouth at bedtime., Disp: , Rfl:  [2] Allergies Allergen Reactions   Abilify [Aripiprazole] Other (See Comments)    CAUSED SEVERE CYSTIC ACNE   Carbamazepine  Other (See Comments)    Patient said this made her feel suicidal   Other Other (See Comments)    NOTHING IN THE -CYCLINE family (like minocycline, tetracycline, etc..) - Patient developed pseudotumor cerebri   Tetracyclines & Related Other (See Comments)    Ear popping, couldn't move neck/back, and had blind spots/double vision - minocycline. Developed pseudotumor cerebri   Lamictal  [Lamotrigine ] Rash   Porcine (Pork) Protein-Containing Drug Products Other (See Comments)    GI reaction to pork in 4th grade. Pt still does not eat for several reasons

## 2024-10-02 NOTE — BH Assessment (Addendum)
 LCSW Progress Note   [id number]    Stacy Poole    10/02/2024   7:33 PM   Description:    Inpatient Psychiatric Referral   Patient was recommended inpatient per Jerel Gravely (NP). There are no available beds at [facility], per [facility] AC Noberto Qua RN). Patient was referred to the following out of network facilities during this pm shift:   Administrator Fax Patient Preferred  [facility]    [address], [place] [address] [phone] --  [facility]    [address] [place], [place] [address] [phone] --  [facility]    [address] [place] [address] [phone] --  [facility]    [address] [place] [address] [phone] --  [facility]    [address], [place] [address] [phone] --  [facility]    [address] [place] [address] [phone] --  [facility]    [address] [place] [address] [phone] --  [facility]    [address] [place], [place] [phone] --  [facility]    [address], [place] [address] [phone] --  [facility]    [address] [place], [place] [address] [phone] --  [facility]    [address] [place] [address] [phone] --

## 2024-10-02 NOTE — ED Notes (Signed)
 Findings Envelope # [id number]

## 2024-10-02 NOTE — BH Assessment (Signed)
 RN Almarie Chute informed this clinician that patient had been sedated earlier.  Pt is currently unable to participate in assessment.

## 2024-10-02 NOTE — ED Notes (Addendum)
 Pt flung herself on floor and has begun screaming. She states there is a russian nerve agent in her ventricle and if she goes to sleep, it will be activated.

## 2024-10-02 NOTE — Progress Notes (Addendum)
 Progress Note  Levi Crass  MRN: [id number]   Disposition Counselor received a call from Intake Coordinator Tobias Hollowell with [facility]. Patient has been accepted for inpatient admission to the facility with an anticipated admission date of 10/03/2024.  Facility Information: [facility] [address] [place], [place] [address] Main Phone: [phone]  The accepting provider is Dr. Chrystal Rodes.  Nurse-to-nurse report may be completed by contacting [phone].  Care Team Update: Damien Fireman, RN, provided disposition updates.

## 2024-10-02 NOTE — ED Notes (Signed)
 Pt currently watching TV and eating dinner

## 2024-10-02 NOTE — ED Notes (Signed)
 Patient belongings are in locker 6

## 2024-10-02 NOTE — ED Notes (Signed)
 Copies distributed; 3 in orange zone purple folder; one in red folder; one in the med rec drawer

## 2024-10-03 NOTE — ED Notes (Signed)
 [facility] called by RN to determine what time patient can be taken to facility. Per Bobetta at [facility], patients can begin arriving at 0800.

## 2024-10-03 NOTE — ED Notes (Signed)
 Secretary notified to merchant navy officer for IVC transport to [facility]

## 2024-10-03 NOTE — ED Notes (Signed)
 Staffing called by RN and spoke with Alisa. Per Alisa, sitter on the way for patient

## 2024-10-03 NOTE — ED Notes (Signed)
 Report called to Georgina at [facility]. Patient going to [address].

## 2024-10-03 NOTE — ED Notes (Signed)
 [employer] at bedside

## 2024-10-03 NOTE — ED Notes (Signed)
 Sitter at bedside.

## 2024-10-03 NOTE — ED Notes (Signed)
 Sitter shift ended. No staffing currently available to sit with patient. Patient calm and cooperative in room.

## 2024-10-03 NOTE — ED Provider Notes (Signed)
 Emergency Medicine Observation Re-evaluation Note  Stacy Poole is a 34 y.o. female, seen on rounds today.  Pt initially presented to the ED for complaints of Psychiatric Evaluation Currently, the patient is resting.  Physical Exam  BP 127/88 (BP Location: Right Arm)   Pulse 99   Temp 98.2 F (36.8 C) (Oral)   Resp 14   Ht 5' 3 (1.6 m)   Wt 63.5 kg   SpO2 100%   BMI 24.80 kg/m  Physical Exam General: Calm Cardiac: Well perfused Lungs: Even respirations Psych: Calm  ED Course / MDM  EKG:EKG Interpretation Date/Time:  Thursday October 02 2024 02:00:28 EST Ventricular Rate:  112 PR Interval:  136 QRS Duration:  72 QT Interval:  320 QTC Calculation: 436 R Axis:   -15  Text Interpretation: Sinus tachycardia Otherwise normal ECG When compared with ECG of 13-May-2024 09:36, No significant change was found Confirmed by Raford Lenis ([id number]) on 10/02/2024 2:11:30 AM  I have reviewed the labs performed to date as well as medications administered while in observation.  Recent changes in the last 24 hours include patient accepted to [facility]. Dr. Arbutus is the accepting physician.  Plan  Current plan is for transfer to [facility].    Darra Fonda MATSU, MD 10/03/24 [phone]

## 2024-10-03 NOTE — Progress Notes (Signed)
 Psychiatric Nurse Liaison (PNL) Rounding Note  Current SI/HI/AVH: Denies SI/HI/AVH  Patient Mood/Affect: Anxiety/Hyperactive  Noted Patient Behaviors: Patient presents with hyperactivity, fidgeting, and rapid/pressured speech.   Interventions Initiated by Psychiatric Nurse Liaison: Emotional support and therapeutic communication provided.   Recommendations for Patient Care: Patient set to discharge to [facility].  Patients Response to Treatment: Effective  Time Spent with Patient:   5 mins

## 2024-10-03 NOTE — ED Notes (Signed)
 Patient discharged with [facility] to [facility]. Provided EMTALA, transfer docs, belongings to [facility]. Charge RN reviewed EMTALA prior to dc. No additional questions from [facility].

## 2024-10-03 NOTE — ED Notes (Signed)
 Provided update to mother. Informed her of patient acceptance to [facility].
